# Patient Record
Sex: Male | Born: 1960 | Race: White | Hispanic: No | Marital: Married | Smoking: Former smoker
Health system: Southern US, Community
[De-identification: ages and names within clinical notes are randomized; demographics above are authoritative.]

## PROBLEM LIST (undated history)

## (undated) DIAGNOSIS — C61 Malignant neoplasm of prostate: Secondary | ICD-10-CM

## (undated) HISTORY — DX: Malignant neoplasm of prostate: C61

## (undated) HISTORY — PX: SHOULDER SURGERY: SHX246

---

## 2004-10-30 ENCOUNTER — Emergency Department: Payer: Self-pay | Admitting: Unknown Physician Specialty

## 2008-08-20 ENCOUNTER — Emergency Department: Payer: Self-pay | Admitting: Emergency Medicine

## 2012-09-01 ENCOUNTER — Inpatient Hospital Stay: Payer: Self-pay | Admitting: Internal Medicine

## 2012-09-01 LAB — COMPREHENSIVE METABOLIC PANEL
Alkaline Phosphatase: 91 U/L (ref 50–136)
Anion Gap: 8 (ref 7–16)
BUN: 15 mg/dL (ref 7–18)
Calcium, Total: 8.9 mg/dL (ref 8.5–10.1)
Chloride: 102 mmol/L (ref 98–107)
Co2: 23 mmol/L (ref 21–32)
Creatinine: 1.15 mg/dL (ref 0.60–1.30)
EGFR (African American): >60
EGFR (Non-African Amer.): >60
Glucose: 114 mg/dL — ABNORMAL HIGH (ref 65–99)
Osmolality: 268 (ref 275–301)
Potassium: 3.9 mmol/L (ref 3.5–5.1)
SGOT(AST): 44 U/L — ABNORMAL HIGH (ref 15–37)
SGPT (ALT): 64 U/L (ref 12–78)
Total Protein: 7.8 g/dL (ref 6.4–8.2)

## 2012-09-01 LAB — URINALYSIS, COMPLETE
Glucose,UR: NEGATIVE mg/dL (ref 0–75)
Leukocyte Esterase: NEGATIVE
Nitrite: NEGATIVE
Ph: 5 (ref 4.5–8.0)
Protein: NEGATIVE
RBC,UR: 4 /HPF (ref 0–5)
Squamous Epithelial: NONE SEEN
WBC UR: 3 /HPF (ref 0–5)

## 2012-09-01 LAB — CBC WITH DIFFERENTIAL/PLATELET
Basophil #: 0.1 10*3/uL (ref 0.0–0.1)
Eosinophil #: 0 10*3/uL (ref 0.0–0.7)
HCT: 40.8 % (ref 40.0–52.0)
Lymphocyte #: 1.6 10*3/uL (ref 1.0–3.6)
Lymphocyte %: 9 %
MCH: 30.7 pg (ref 26.0–34.0)
MCHC: 34.4 g/dL (ref 32.0–36.0)
Monocyte #: 1.4 x10 3/mm — ABNORMAL HIGH (ref 0.2–1.0)
Monocyte %: 8.4 %
Neutrophil #: 14.1 10*3/uL — ABNORMAL HIGH (ref 1.4–6.5)
Neutrophil %: 82.2 %
Platelet: 200 10*3/uL (ref 150–440)
RBC: 4.59 10*6/uL (ref 4.40–5.90)
WBC: 17.1 10*3/uL — ABNORMAL HIGH (ref 3.8–10.6)

## 2012-09-01 LAB — PROTIME-INR
INR: 1
Prothrombin Time: 13 secs (ref 11.5–14.7)

## 2012-09-01 LAB — TSH: Thyroid Stimulating Horm: 2.8 u[IU]/mL

## 2012-09-01 LAB — CK TOTAL AND CKMB (NOT AT ARMC): CK-MB: 1.4 ng/mL (ref 0.5–3.6)

## 2012-09-01 LAB — TROPONIN I: Troponin-I: <0.02 ng/mL

## 2012-09-01 LAB — HEMOGLOBIN A1C: Hemoglobin A1C: 5.1 % (ref 4.2–6.3)

## 2012-09-01 IMAGING — US US PELVIS LIMITED
1 series · 14 of 25 positions shown · non-contrast
Comparison: none

REASON FOR EXAM: swelling, pain
COMMENTS:

PROCEDURE:     US  - US TESTICULAR  - [DATE]  [DATE]
RESULT:     Comparison: None
TECHNIQUE: Multiple gray-scale, color Doppler, and spectral Doppler images
were obtained of the testicles.

[Series 1: us pelvis limited · 0.09mm/px · 14 of 87 slices shown]
[im 1/87]
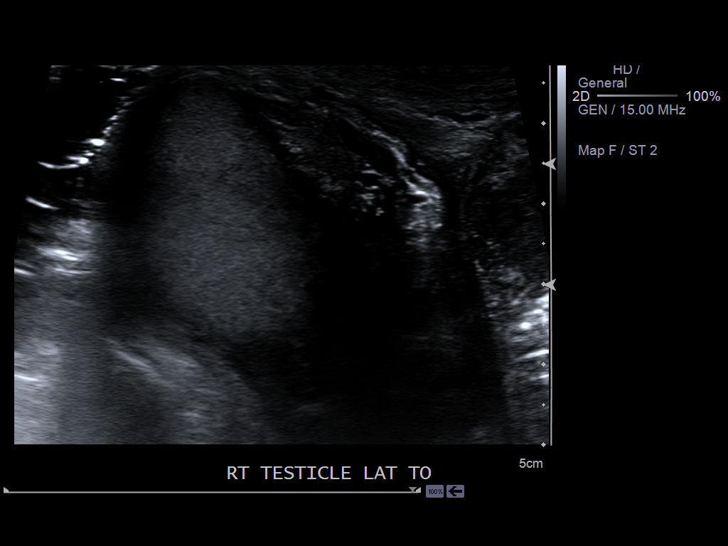
[im 8/87]
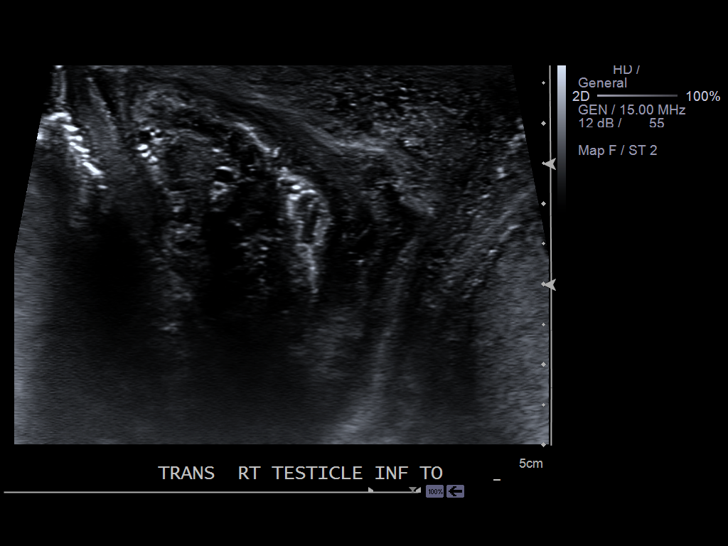
[im 15/87]
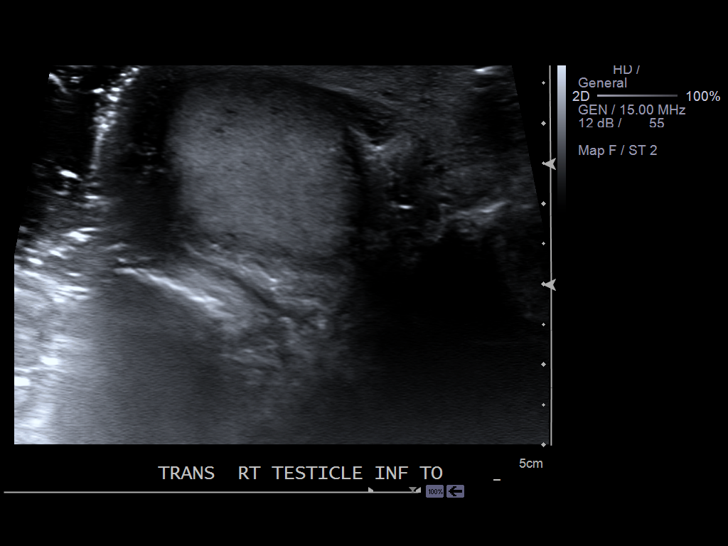
[im 22/87]
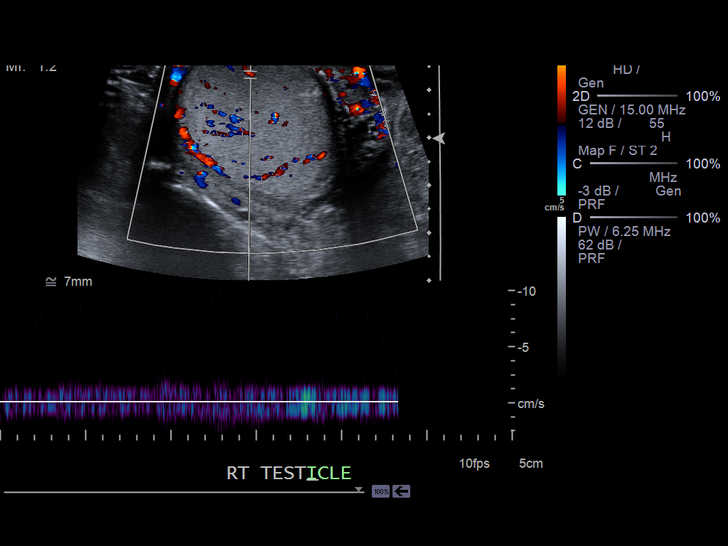
[im 29/87]
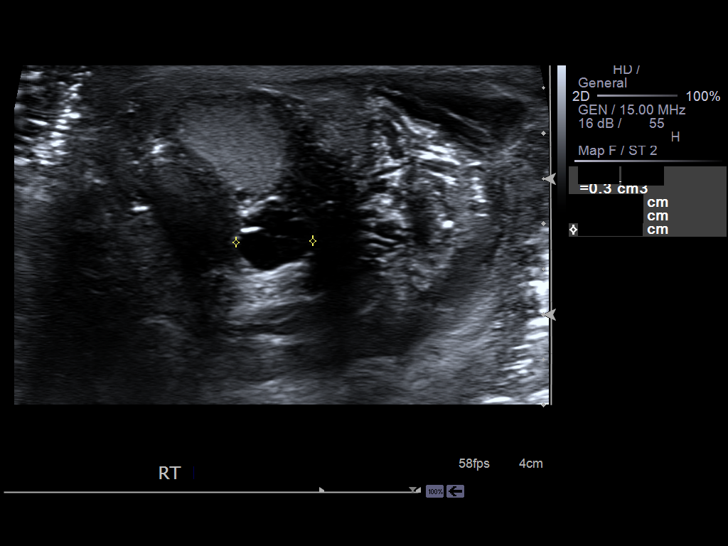
[im 33/87]
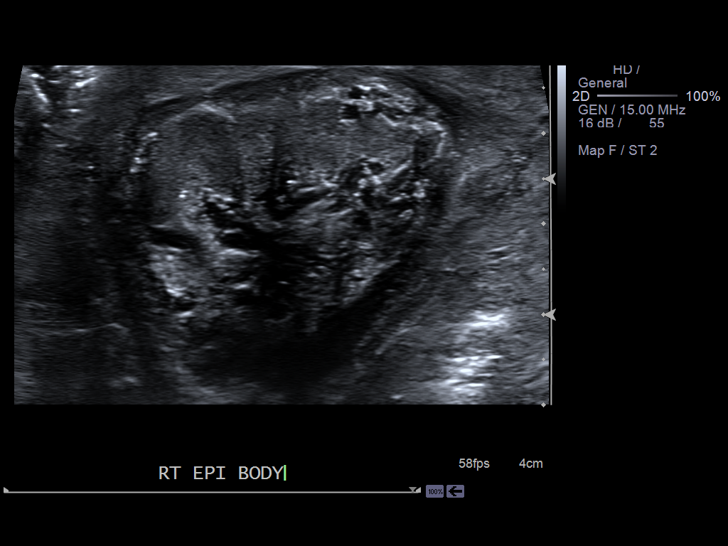
[im 40/87]
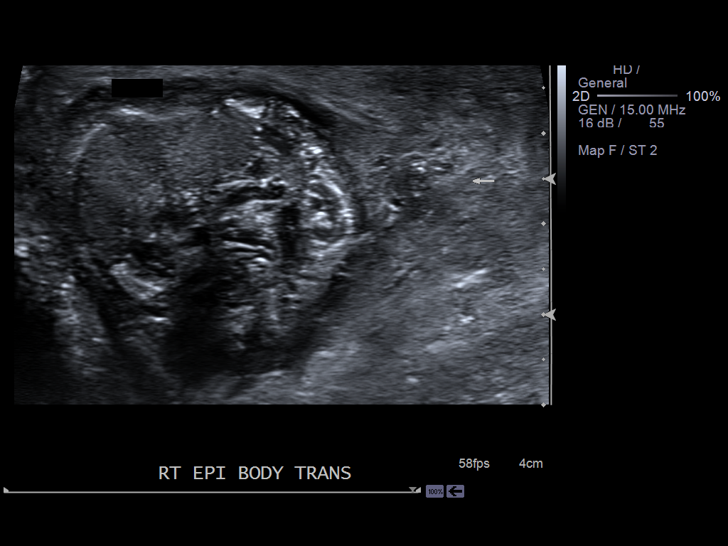
[im 47/87]
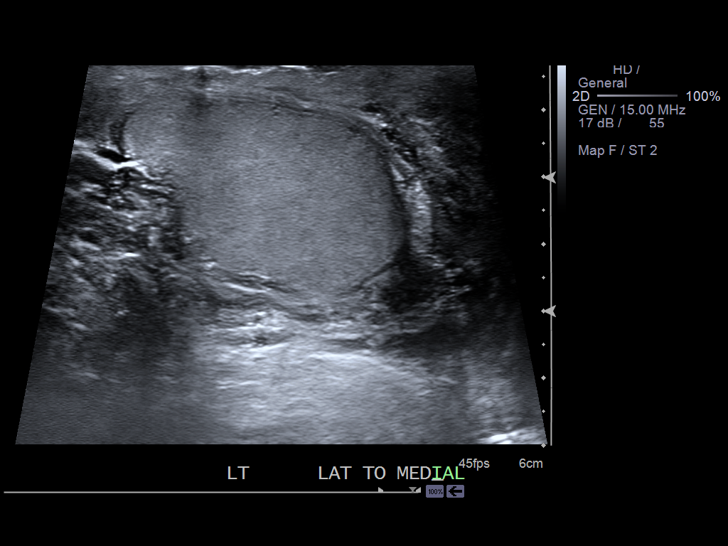
[im 54/87]
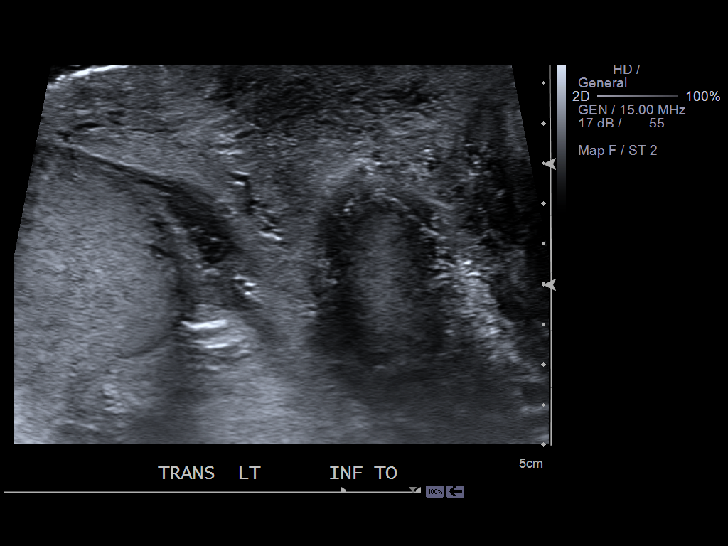
[im 58/87]
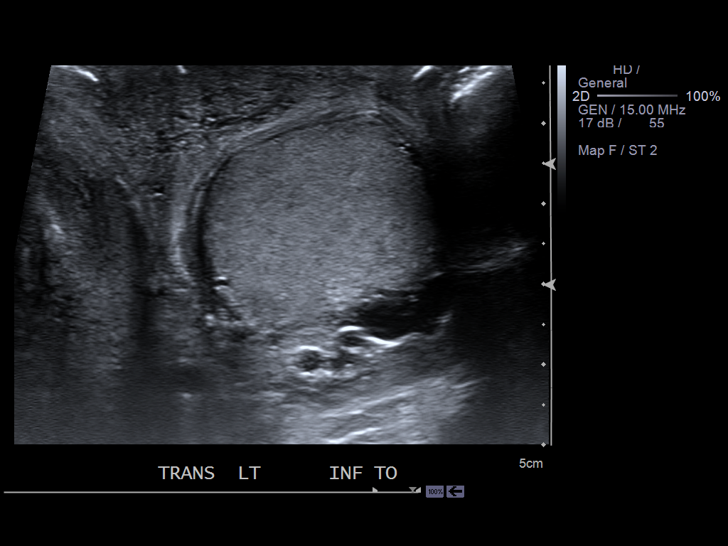
[im 65/87]
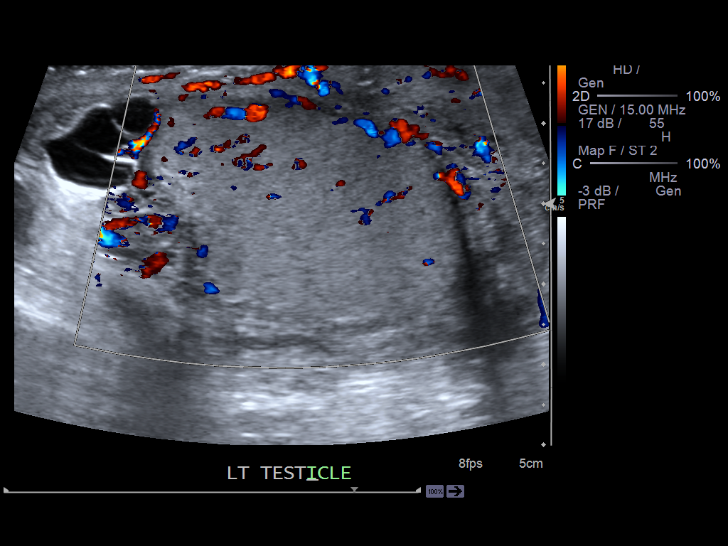
[im 72/87]
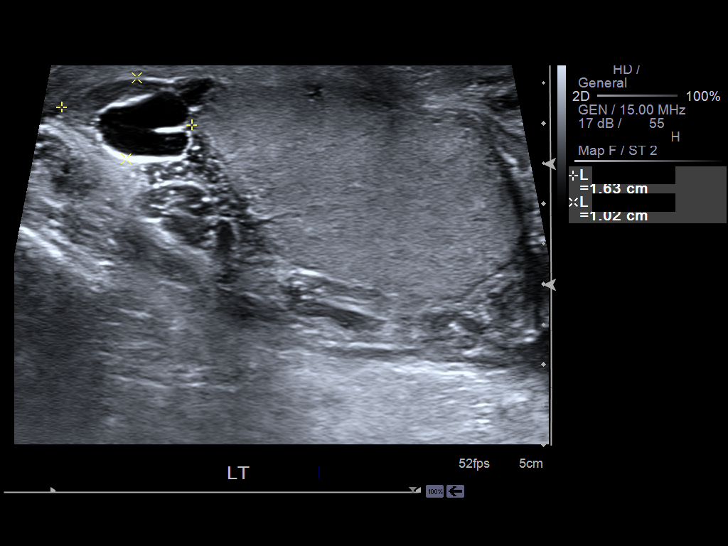
[im 79/87]
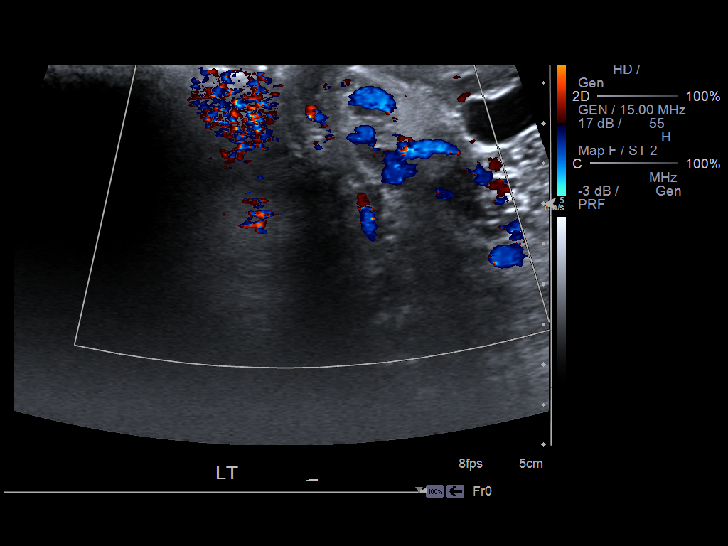
[im 87/87]
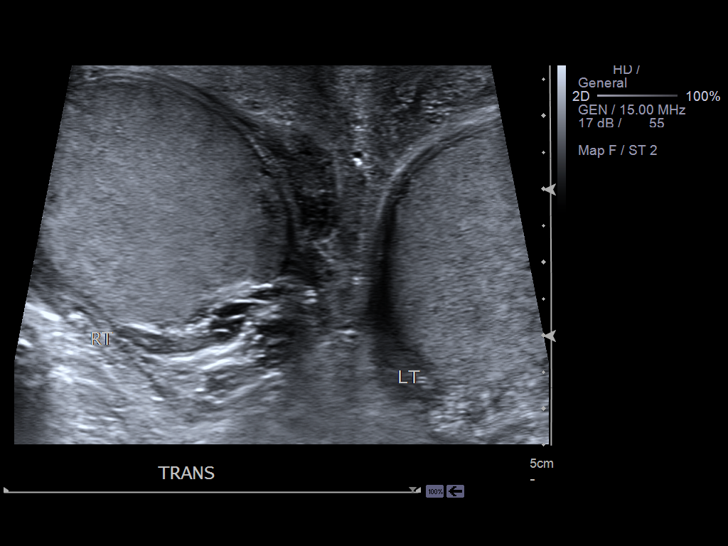

[14 of 25 positions shown; findings below may reference images not displayed]

FINDINGS: The right testicle measures 4.1 x 3.7 x 3.4 cm. The left testicle measures
4.6 x 3.0 x 2.7 cm. The testicles are homogeneous in echotexture. Arterial
and venous waveforms are demonstrated in bilateral testicles. The right
epididymis appears relatively heterogeneous and hypervascular. Correlate for
epididymitis. There are several cysts in the left epididymis. The soft
tissues and skin of the scrotum appears thickened, left greater than right.
This is nonspecific.
IMPRESSION: 1. The right epididymis is heterogeneous and hypervascular. Correlate for
epididymitis.
2. Scrotal soft tissues and skin appear thickened, left greater than right.
This is nonspecific. Possibilities would include cellulitis.

[REDACTED]

## 2012-09-01 IMAGING — CR DG CHEST 2V
1 series · 2 of 2 positions shown · non-contrast
Comparison: none

REASON FOR EXAM: fever cough
COMMENTS:

[Series 1: w chest pa · 0.14mm/px · 2 of 2 slices shown]
[im 1/2]
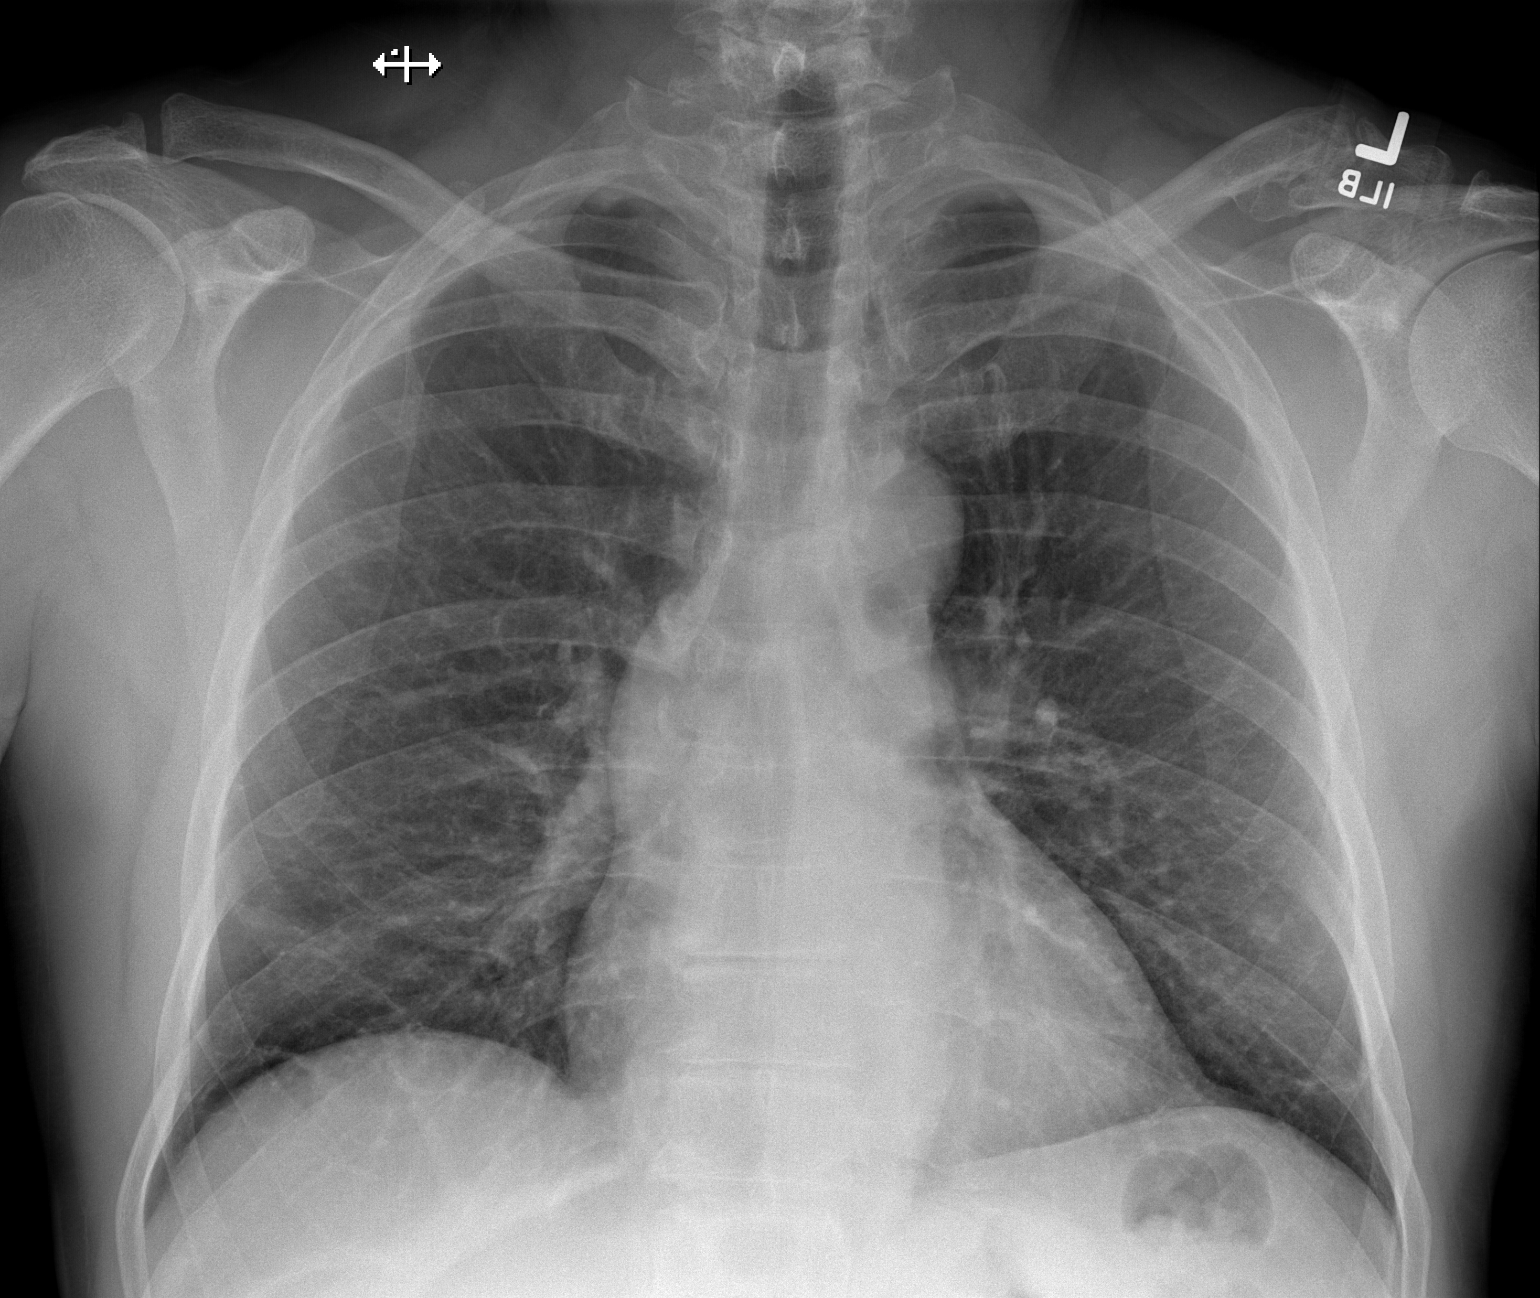
[im 2/2]
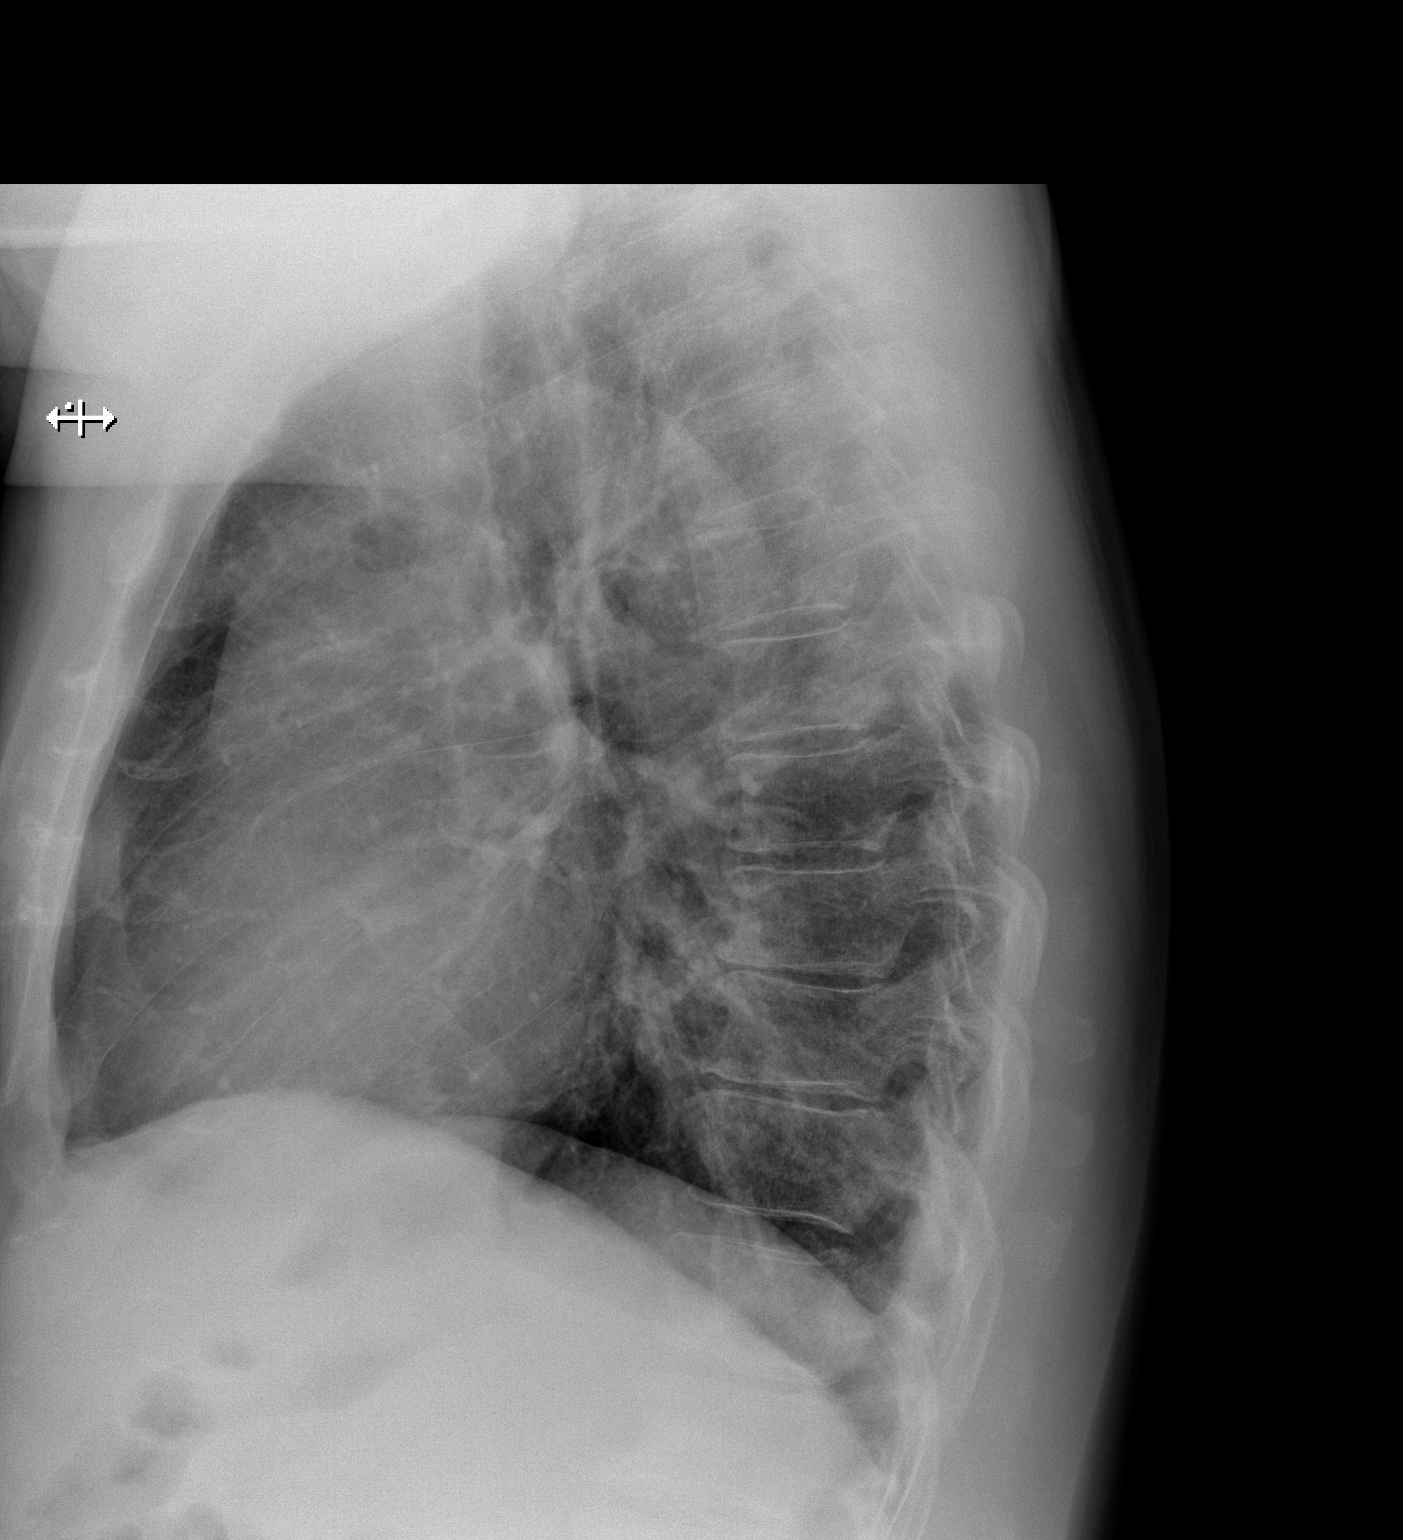

[2 of 2 positions shown; findings below may reference images not displayed]

PROCEDURE:     DXR - DXR CHEST PA (OR AP) AND LATERAL  - [DATE]  [DATE]

RESULT:     The lungs are well-expanded. The interstitial markings are
increased bilaterally. The cardiac silhouette is normal in size. The
pulmonary vascularity is not engorged. There is no pleural effusion. The
bony thorax exhibits no acute abnormality.
IMPRESSION: Increased interstitial markings especially on the right
suggest subsegmental atelectasis or early pneumonia. Followup films
following therapy are recommended to assure clearing.

[REDACTED]

## 2012-09-02 LAB — CBC WITH DIFFERENTIAL/PLATELET
Eosinophil #: 0 10*3/uL (ref 0.0–0.7)
Eosinophil %: 0 %
HCT: 34.7 % — ABNORMAL LOW (ref 40.0–52.0)
HGB: 11.9 g/dL — ABNORMAL LOW (ref 13.0–18.0)
MCH: 30.8 pg (ref 26.0–34.0)
MCV: 90 fL (ref 80–100)
Monocyte #: 1.9 x10 3/mm — ABNORMAL HIGH (ref 0.2–1.0)
Monocyte %: 9.5 %
Neutrophil #: 16.8 10*3/uL — ABNORMAL HIGH (ref 1.4–6.5)
Neutrophil %: 86.6 %
Platelet: 146 10*3/uL — ABNORMAL LOW (ref 150–440)
RDW: 14.2 % (ref 11.5–14.5)

## 2012-09-02 LAB — CK TOTAL AND CKMB (NOT AT ARMC)
CK-MB: 1 ng/mL (ref 0.5–3.6)
CK-MB: 1 ng/mL (ref 0.5–3.6)

## 2012-09-02 LAB — COMPREHENSIVE METABOLIC PANEL
Albumin: 2.6 g/dL — ABNORMAL LOW (ref 3.4–5.0)
Alkaline Phosphatase: 71 U/L (ref 50–136)
Anion Gap: 12 (ref 7–16)
BUN: 15 mg/dL (ref 7–18)
Calcium, Total: 8.1 mg/dL — ABNORMAL LOW (ref 8.5–10.1)
Chloride: 107 mmol/L (ref 98–107)
Co2: 20 mmol/L — ABNORMAL LOW (ref 21–32)
Creatinine: 1.11 mg/dL (ref 0.60–1.30)
EGFR (African American): >60
EGFR (Non-African Amer.): >60
Glucose: 141 mg/dL — ABNORMAL HIGH (ref 65–99)
Osmolality: 281 (ref 275–301)
Potassium: 4 mmol/L (ref 3.5–5.1)
SGOT(AST): 20 U/L (ref 15–37)
Sodium: 139 mmol/L (ref 136–145)

## 2012-09-02 LAB — MAGNESIUM: Magnesium: 1.3 mg/dL — ABNORMAL LOW

## 2012-09-02 LAB — GC/CHLAMYDIA PROBE AMP

## 2012-09-03 LAB — BASIC METABOLIC PANEL
Anion Gap: 7 (ref 7–16)
Chloride: 109 mmol/L — ABNORMAL HIGH (ref 98–107)
Co2: 24 mmol/L (ref 21–32)
Creatinine: 0.98 mg/dL (ref 0.60–1.30)
EGFR (African American): >60
Osmolality: 284 (ref 275–301)
Potassium: 4.1 mmol/L (ref 3.5–5.1)
Sodium: 140 mmol/L (ref 136–145)

## 2012-09-03 LAB — CBC WITH DIFFERENTIAL/PLATELET
Basophil #: 0 10*3/uL (ref 0.0–0.1)
Eosinophil %: 0.5 %
HCT: 34.8 % — ABNORMAL LOW (ref 40.0–52.0)
HGB: 12.1 g/dL — ABNORMAL LOW (ref 13.0–18.0)
Lymphocyte #: 0.8 10*3/uL — ABNORMAL LOW (ref 1.0–3.6)
Lymphocyte %: 5 %
MCHC: 34.8 g/dL (ref 32.0–36.0)
Monocyte #: 0.6 x10 3/mm (ref 0.2–1.0)
Monocyte %: 3.8 %
Platelet: 161 10*3/uL (ref 150–440)
RDW: 13.8 % (ref 11.5–14.5)
WBC: 15 10*3/uL — ABNORMAL HIGH (ref 3.8–10.6)

## 2012-09-03 LAB — URINE CULTURE

## 2012-09-04 ENCOUNTER — Ambulatory Visit: Payer: Self-pay | Admitting: Urology

## 2012-09-04 LAB — CBC WITH DIFFERENTIAL/PLATELET
Basophil #: 0.1 10*3/uL (ref 0.0–0.1)
Eosinophil %: 0.8 %
HCT: 36.4 % — ABNORMAL LOW (ref 40.0–52.0)
Lymphocyte #: 1.2 10*3/uL (ref 1.0–3.6)
MCV: 89 fL (ref 80–100)
Monocyte %: 9.8 %
Neutrophil #: 12.3 10*3/uL — ABNORMAL HIGH (ref 1.4–6.5)
Neutrophil %: 80.7 %
Platelet: 171 10*3/uL (ref 150–440)
RBC: 4.09 10*6/uL — ABNORMAL LOW (ref 4.40–5.90)
RDW: 13.9 % (ref 11.5–14.5)
WBC: 15.3 10*3/uL — ABNORMAL HIGH (ref 3.8–10.6)

## 2012-09-04 LAB — BASIC METABOLIC PANEL
BUN: 12 mg/dL (ref 7–18)
Calcium, Total: 8.6 mg/dL (ref 8.5–10.1)
Chloride: 106 mmol/L (ref 98–107)
Co2: 25 mmol/L (ref 21–32)
Creatinine: 1.09 mg/dL (ref 0.60–1.30)
EGFR (African American): >60
Glucose: 93 mg/dL (ref 65–99)
Potassium: 3.7 mmol/L (ref 3.5–5.1)
Sodium: 138 mmol/L (ref 136–145)

## 2012-09-05 LAB — CBC WITH DIFFERENTIAL/PLATELET
Basophil #: 0.1 10*3/uL (ref 0.0–0.1)
Basophil %: 0.5 %
HCT: 38.5 % — ABNORMAL LOW (ref 40.0–52.0)
HGB: 13 g/dL (ref 13.0–18.0)
Lymphocyte %: 9.3 %
MCH: 30.3 pg (ref 26.0–34.0)
MCHC: 33.8 g/dL (ref 32.0–36.0)
Monocyte #: 1.9 x10 3/mm — ABNORMAL HIGH (ref 0.2–1.0)
Monocyte %: 14.3 %
Neutrophil #: 10.1 10*3/uL — ABNORMAL HIGH (ref 1.4–6.5)
Neutrophil %: 75.1 %
RBC: 4.3 10*6/uL — ABNORMAL LOW (ref 4.40–5.90)
RDW: 14 % (ref 11.5–14.5)
WBC: 13.4 10*3/uL — ABNORMAL HIGH (ref 3.8–10.6)

## 2012-09-06 LAB — CBC WITH DIFFERENTIAL/PLATELET
Basophil #: 0.1 10*3/uL (ref 0.0–0.1)
Basophil %: 0.8 %
Eosinophil #: 0.2 10*3/uL (ref 0.0–0.7)
Eosinophil %: 2.5 %
HCT: 36.9 % — ABNORMAL LOW (ref 40.0–52.0)
Lymphocyte #: 1.8 10*3/uL (ref 1.0–3.6)
Lymphocyte %: 18.2 %
MCH: 31 pg (ref 26.0–34.0)
Monocyte #: 1.6 x10 3/mm — ABNORMAL HIGH (ref 0.2–1.0)
Platelet: 224 10*3/uL (ref 150–440)
RBC: 4.16 10*6/uL — ABNORMAL LOW (ref 4.40–5.90)
RDW: 14.1 % (ref 11.5–14.5)

## 2012-09-06 LAB — CULTURE, BLOOD (SINGLE)

## 2012-09-06 LAB — BASIC METABOLIC PANEL
Anion Gap: 8 (ref 7–16)
Chloride: 106 mmol/L (ref 98–107)
Osmolality: 282 (ref 275–301)
Potassium: 3.5 mmol/L (ref 3.5–5.1)
Sodium: 142 mmol/L (ref 136–145)

## 2012-09-07 LAB — CBC WITH DIFFERENTIAL/PLATELET
Basophil #: 0.1 10*3/uL (ref 0.0–0.1)
Basophil %: 0.9 %
Eosinophil #: 0.3 10*3/uL (ref 0.0–0.7)
Eosinophil %: 3.4 %
HCT: 36.5 % — ABNORMAL LOW (ref 40.0–52.0)
HGB: 12.6 g/dL — ABNORMAL LOW (ref 13.0–18.0)
Lymphocyte %: 18.1 %
MCH: 30.9 pg (ref 26.0–34.0)
MCHC: 34.6 g/dL (ref 32.0–36.0)
MCV: 89 fL (ref 80–100)
Monocyte #: 1.6 x10 3/mm — ABNORMAL HIGH (ref 0.2–1.0)
Monocyte %: 15.9 %
Platelet: 288 10*3/uL (ref 150–440)
RDW: 14.2 % (ref 11.5–14.5)
WBC: 10.1 10*3/uL (ref 3.8–10.6)

## 2012-09-07 IMAGING — US US PELVIS LIMITED
1 series · 14 of 25 positions shown · non-contrast
Comparison: none

REASON FOR EXAM: b/l swelling, drainage from testies, r/o abscess
COMMENTS:

[Series 1: us pelvis limited · 0.11mm/px · 14 of 61 slices shown]
[im 1/61]
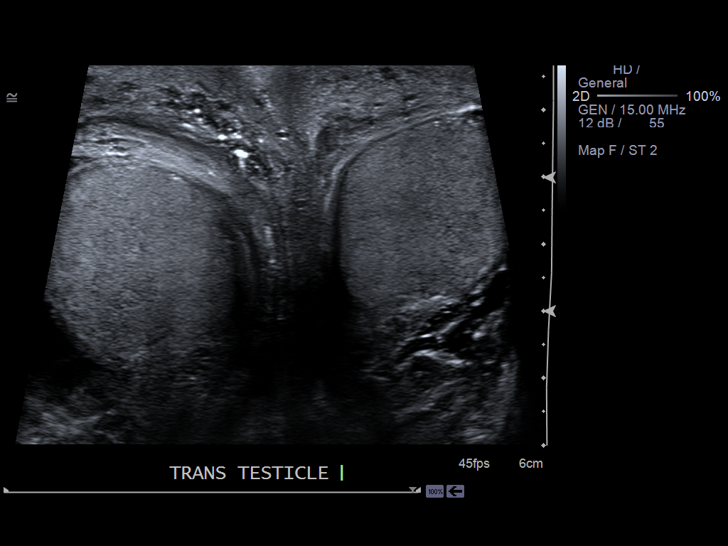
[im 6/61]
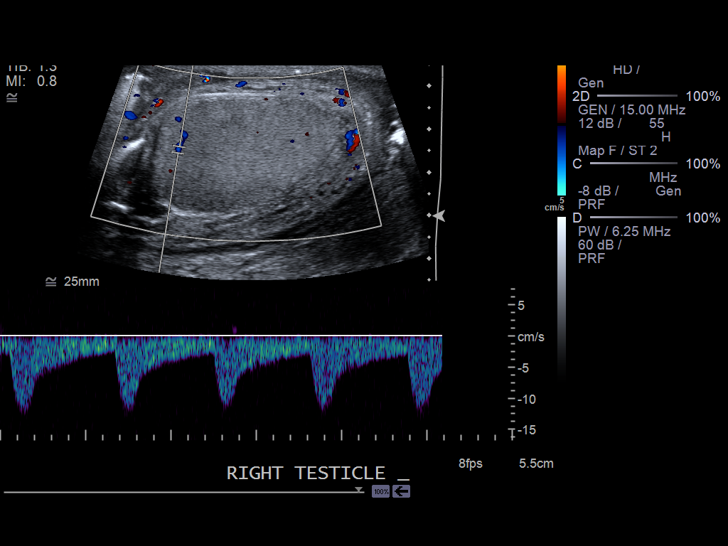
[im 11/61]
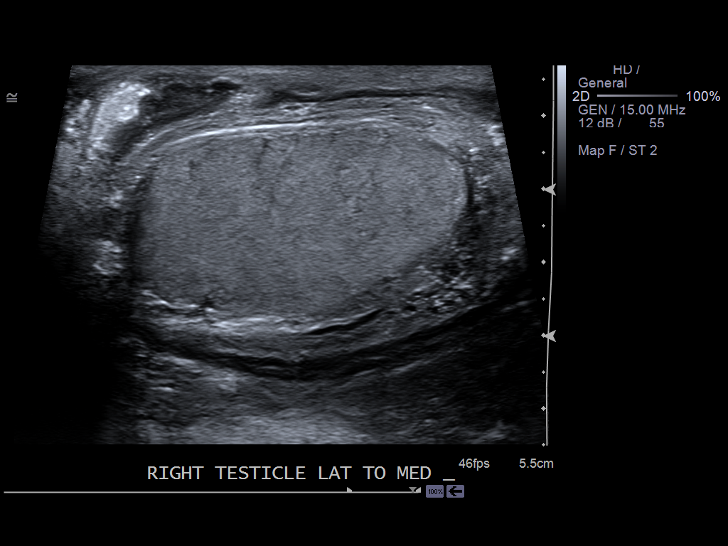
[im 16/61]
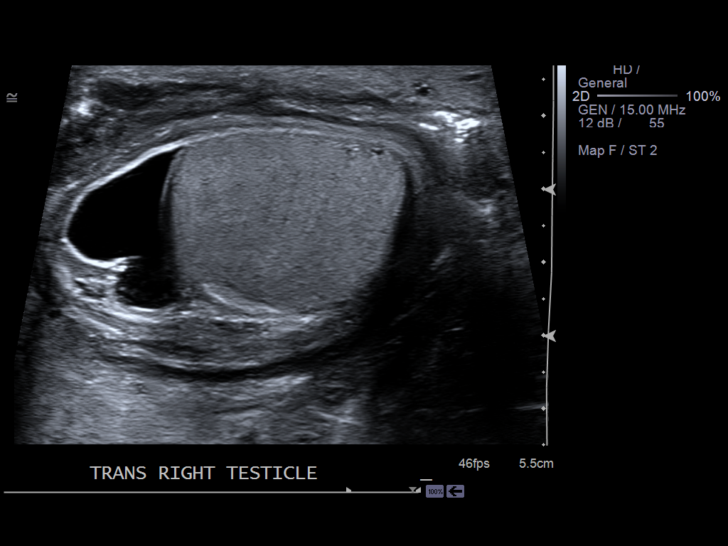
[im 21/61]
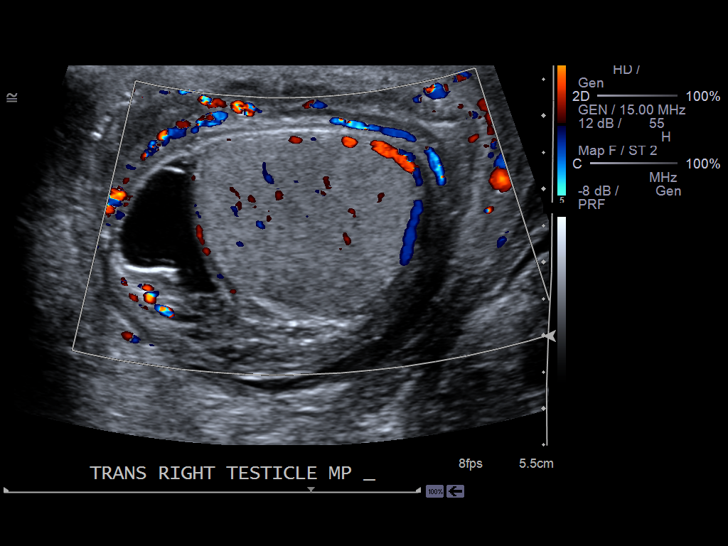
[im 23/61]
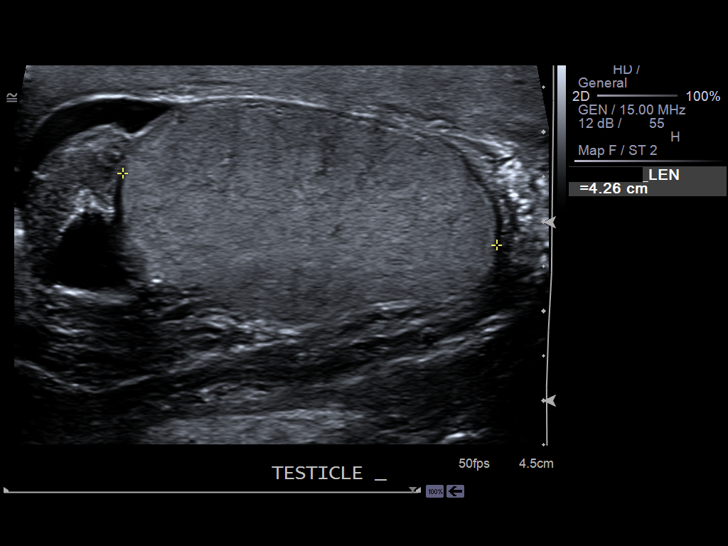
[im 28/61]
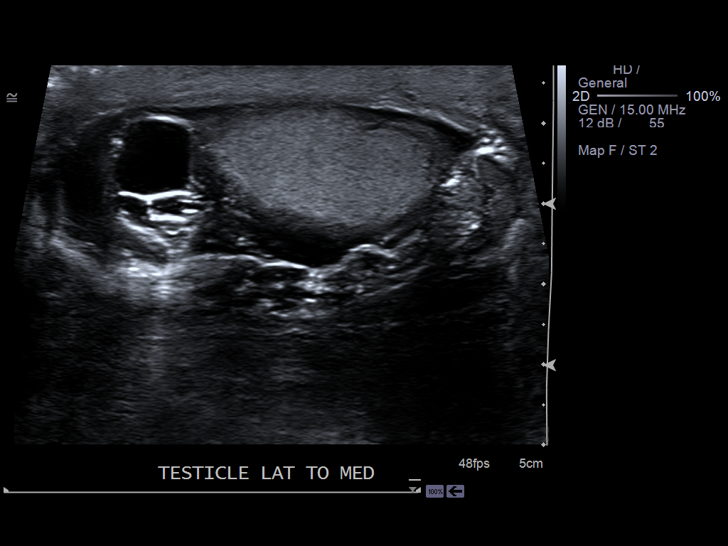
[im 33/61]
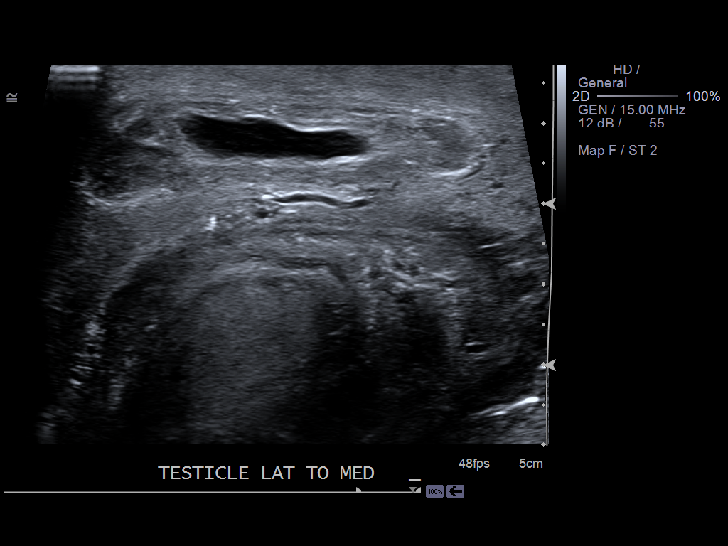
[im 38/61]
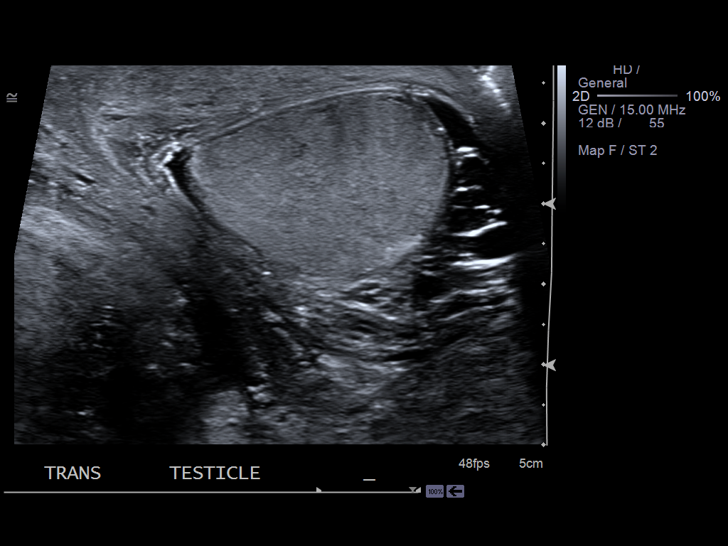
[im 41/61]
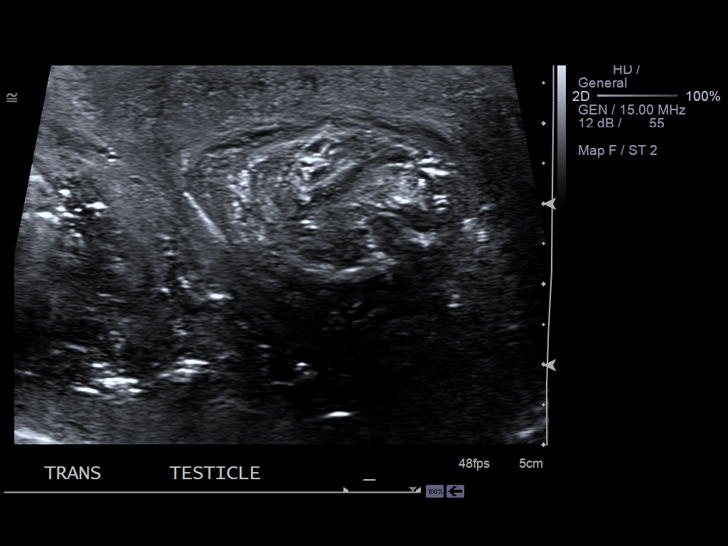
[im 46/61]
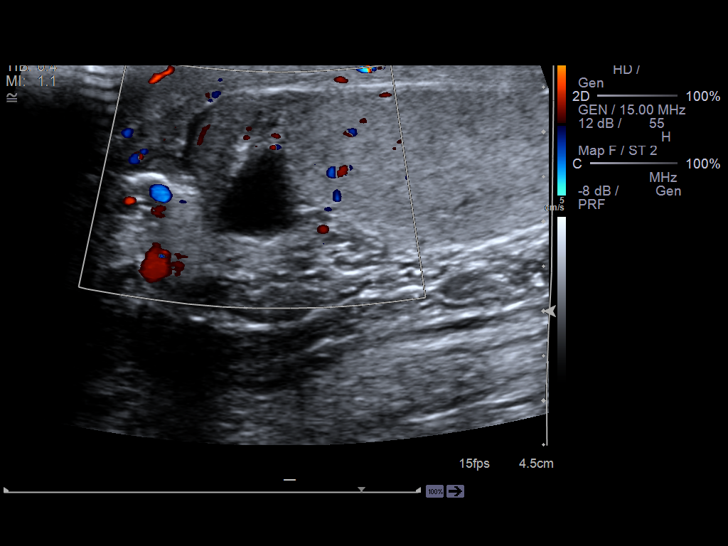
[im 51/61]
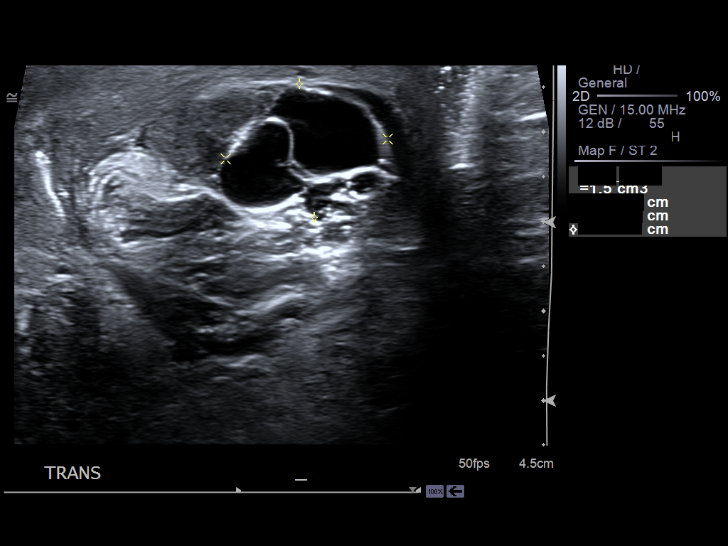
[im 56/61]
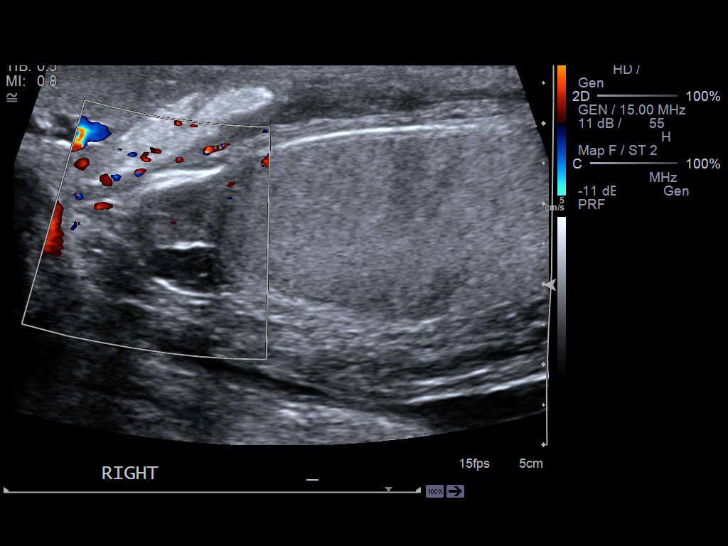
[im 61/61]
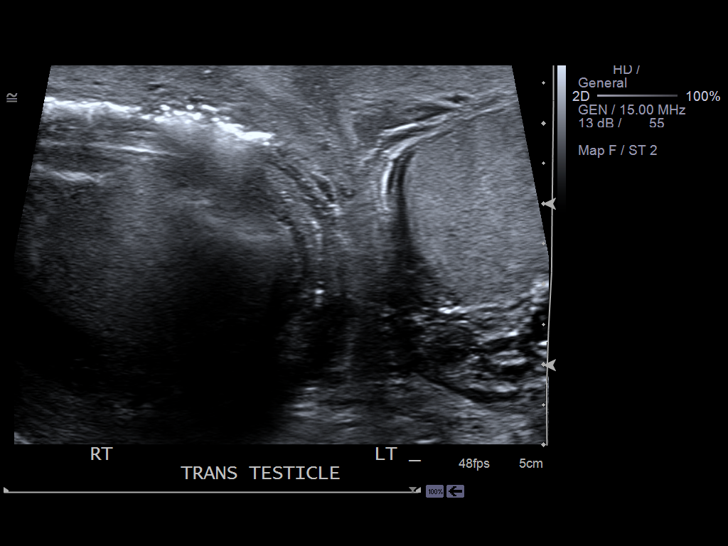

[14 of 25 positions shown; findings below may reference images not displayed]

PROCEDURE:     US  - US TESTICULAR  - [DATE]  [DATE]

RESULT:     Testicular ultrasound is performed. The right testicle measures
4.40 x 3.31 x 2.55 cm. The left testicle measures 4.26 x 3.27 x 2.41 cm.
Epididymal cysts are present bilaterally. The complex cyst on the left is
1.02 x 1.83 x 1.50 cm in size and is septated. The right epididymal cyst
measures 0.98 x 0.79 x 0.70 cm. Arterial and venous waveforms are
demonstrated with spectral Doppler in both testicles. There are bilateral
hydroceles with septations in the right hydrocele. No testicular mass or
calcification is present.
IMPRESSION: Bilateral complex epididymal cysts. Bilateral hydroceles
with septations on the right. Scrotal wall thickening is present
bilaterally. Calcifications are seen around the right scrotum which could
represent scrotal pearls.

[REDACTED]

## 2012-09-16 DIAGNOSIS — N390 Urinary tract infection, site not specified: Secondary | ICD-10-CM | POA: Insufficient documentation

## 2012-09-16 DIAGNOSIS — N454 Abscess of epididymis or testis: Secondary | ICD-10-CM | POA: Insufficient documentation

## 2012-09-16 DIAGNOSIS — N492 Inflammatory disorders of scrotum: Secondary | ICD-10-CM | POA: Insufficient documentation

## 2014-06-24 NOTE — Discharge Summary (Signed)
PATIENT NAME:  Cody Vaughan, Cody Vaughan MR#:  [id number] DATE OF BIRTH:  [birth year]  DATE OF ADMISSION:  09/01/2012 DATE OF DISCHARGE:  09/09/2012  ADMITTING DIAGNOSIS: Scrotal swelling, pain.   DISCHARGE DIAGNOSES:  1. Scrotal swelling and sepsis due to severe epididymitis with associated possible abscess with spontaneous drainage, status post evaluation by urology.  2. Elevated transaminase, likely due to chronic hepatitis C.  3. Leukocytosis due to epididymitis, now resolved.  4. Previous substance abuse with alcohol as well as cocaine. No evidence of withdrawal.  5. Status post left shoulder surgery in 1980 and right thumb surgery.   CONSULTANTS: Dr. Jacqlyn Larsen as well as Dr. Delma Officer of urology.   PERTINENT LABORATORY AND EVALUATIONS: His BMP on admission: Glucose 114, BUN 15, creatinine 1.15, sodium 133, potassium was 3.9, chloride 102, CO2 was 23, calcium was 8.9, his magnesium 1.4. LFTs: Total protein 7.8, albumin 3.6, bilirubin total was 0.8, alkaline phosphatase was 91, AST 44, ALT was 64. CPK was 617, CK-MB 1.4, troponin less than 0.02. TSH 2.80. WBC 17.1, hemoglobin 14.1, platelet count was 200. Blood cultures x 2: No growth. Chlamydia and Neisseria gonorrhoeae were negative. Cultures from the drainage showed a few gram-positive cocci in pairs, rare gram-negative rods, rare gram-positive rods. Chest x-ray on admission showed increased interstitial marking on the right suggestive of segmental atelectasis. Ultrasound on presentation showed right epididymis is heterogeneous and hypervascular, suggestive of possible epididymitis, scrotal soft tissue and skin appear thickened, left greater than right, it is nonspecific, possibly could be cellulitis. Repeat ultrasound on July 7th showed bilateral complex epidermal cyst, bilateral hydroceles with septation of the right, scrotal wall thickening is present bilaterally. Calcifications are seen around the right scrotum, which could represent scrotal pearls.    HOSPITAL COURSE: Please refer to H and P done by the admitting physician. The patient is a 54 year old white male who presented with scrotal swelling going on for more than a few days. He also started having fevers. He waited until he came to the ED. He had significant amount of swelling on his scrotum on presentation. He had fever and tachycardia. He was thought to have possible sepsis due to epididymitis. The patient was started on broad-spectrum antibiotics. He was treated with IV Zosyn. Initially, he was on IV Levaquin, which was subsequently changed to Zosyn due to severity of his epididymitis. The patient was continued on IV antibiotics up until discharge. He was seen in consultation by urology. After being hospitalized for a few days, the patient started having spontaneous drainage and opening in his scrotal sac of whitish drainage. Once this drainage started happening, his swelling started improving. He was seen by urology. They did not feel that he had Fournier's gangrene. He had 2 ultrasounds which failed to reveal any loculated abscess. The patient is doing much better and will be treated with oral antibiotics for 10 more days.   DISCHARGE MEDICATIONS:  1. Trazodone 100 q.h.s.  2. Gabapentin 300 two caps 3 times a day.  3. Melatonin 1 tab q.h.s.  4. Ibuprofen 400 mg q.4 p.r.n. 5. Acetaminophen/hydrocodone 325/5 q.6 p.r.n. for pain. 6. Levaquin 750 one tab p.o. q.24 for the next 10 days.   FOLLOWUP: Outpatient followup with Dr. Jacqlyn Larsen in 1 to 2 weeks. Outpatient followup with primary MD in 1 to 2 weeks.   TIME SPENT: Note, 35 minutes spent on the discharge.  ____________________________ Lafonda Mosses. Posey Pronto, MD shp:OSi D: 09/10/2012 08:13:47 ET T: 09/10/2012 08:30:27 ET JOB#: [id number]  cc: Prairie Stenberg H. Posey Pronto,  MD, <Dictator> Alric Seton MD ELECTRONICALLY SIGNED 09/15/2012 11:00

## 2014-06-24 NOTE — H&P (Signed)
PATIENT NAME:  Cody Vaughan, Cody Vaughan MR#:  [id number] DATE OF BIRTH:  1960-03-27  DATE OF ADMISSION:  09/01/2012  PRIMARY CARE PHYSICIAN:  Unknown.   HISTORY OF PRESENT ILLNESS:  The patient is a 54 year old Caucasian male with past medical history significant for history of substance abuse, alcohol, as well as cocaine. Also history of hepatitis C, which is not treated. He presents to the hospital with complaints of fevers. According to patient as well as his wife, he has been having fevers for the past 3 days. He  has complaints of achiness, fever, as well as feeling bad, and has swollen, red right testicle area. He presented to the hospital and he had ultrasound of his testicular area, which revealed right epididymitis as well as questionable cellulitis, and hospitalist services were contacted for admission. The patient stated that whenever his fever started, that is when he started having the swelling of the right side of his scrotum. It has worsened over the past 3 days, and now he cannot even walk.   PAST MEDICAL HISTORY:  Significant for history of substance abuse, alcohol as well as cocaine, history of hepatitis C.  MEDICATIONS:  Gabapentin 600 mg p.o. 3 times daily, ibuprofen 500 mg  p.o. every 4 hours as needed, melatonin 3 mg p.o. at bedtime, trazodone 100 mg p.o. at bedtime.   ALLERGIES:  None.   PAST SURGICAL HISTORY:  Left shoulder surgery in 1980s and right thumb surgery.  FAMILY HISTORY:  Significant for history of MI in patient's dad, who died in his 43s. The patient's mother had COPD. She was a smoker, and she died in her 4s as well. The patient's maternal grandmother had ovarian cancer. Maternal grandfather had lung cancer.   SOCIAL HISTORY:  The patient is married and has 2 children. Smokes 1 pack per day for the past 38 years.  No alcohol now. He is clean of alcohol as well as cocaine for the past 8 months. 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  REVIEW OF SYSTEMS:   GENERAL:   Positive for fevers and chills, significant weakness, pains in his right groin, weight loss, some blurring of vision, intermittent cough, some clear sputum production, right chest pains. He describes it as a sharp pain, intermittent, with right arm numbness. It improves whenever he lifts his arm up. Admits to having some lower extremity cramping, increased frequency of urination, and dysuria symptoms. Denies any vision problems such as double vision, glaucoma or cataracts.  EARS, NOSE, THROAT:  Denies any tinnitus, allergies, epistaxis, sinus pain, dentures, or difficulty swallowing.  RESPIRATORY:  Denies any wheezing, asthma, COPD.  CARDIOVASCULAR:  Denies any orthopnea, edema, arrhythmias or palpitations.  GASTROINTESTINAL:  Denies any nausea, vomiting, diarrhea or constipation. GENITOURINARY: Denies dysuria, hematuria, incontinence. ENDOCRINE:  Denies any polydipsia, nocturia, thyroid problems, heat or cold intolerance, or thirst. HEMATOLOGIC/LYMPHATIC: Denies anemia, easy bruising, bleeding, swollen glands. SKIN:  Denies acne, rash,  lesions, or change in moles.  MUSCULOSKELETAL: Denies (Dictation Anomaly) muscle aches, contusion, difficulty walking.  NEUROLOGIC:  No numbness, epilepsy or tremor.  PSYCHIATRIC:  Denies anxiety, insomnia, depression.  PHYSICAL EXAMINATION: VITAL SIGNS:  On arrival to the hospital, temperature 102.3, pulse 111, respiratory rate 20, blood pressure 121/66, saturation 96% on room air.  GENERAL: This is a well-developed, well-nourished Caucasian male in moderate distress secondary to pain, lying on the stretcher.  HEENT: His pupils are equal, reactive to light. Extraocular muscles intact. No icterus or conjunctivitis. He has normal hearing. No pharyngeal erythema. Mucosa  is dry.  NECK:  No masses.  Supple and nontender. Thyroid is not enlarged. No JVD or carotid bruits bilaterally. Full range of motion. LUNGS: Clear to auscultation in all fields.  No rales, rhonchi or  wheezing. No labored respirations, increased effort, dullness to percussion or overt respiratory distress.    CARDIOVASCULAR:  S1, S2 appreciated.  No gallops or rubs noted.  PMI not lateralized.  CHEST:  Nontender to palpation.   EXTREMITIES:  Pedal pulses 1+, No lower extremity edema, calf tenderness or cyanosis was noted.   ABDOMEN: Soft, nontender. Bowel sounds are present. No organomegaly or masses. MUSCLE STRENGTH: Able to move all extremities. No sign of degenerative joint disease or kyphosis.  Gait is not tested.   SKIN:  Revealed significant swelling in the right groin area around the scrotum. The  right side is especially swollen. Increased warmth was noted as well as significant pain. Not able to palpate his epididymis. However, all area of scrotum is severely swollen. I am not able to palpate his testicles as well due to significant pain. Some induration of the skin. Increased redness as well as increased warmth of the skin was noted on the right side and right groin. Otherwise, his skin was swarm and dry to palpation. No adenopathy in cervical region. NEUROLOGIC: Cranial nerves grossly intact. Sensory is intact.  PSYCHIATRIC:  The patient is alert, oriented to time, person, place. Cooperative. Memory is good. No significant confusion, agitation or depression noted.   LABORATORIES:  BMP showed glucose 114, sodium 133, otherwise BMP was unremarkable. Ammonia level was 31. Liver enzymes revealed AST elevation to 44, otherwise unremarkable. Troponin was less than 0.02. White blood cell count is elevated to 17.1, hemoglobin 14.1, platelet count 200, absolute neutrophil count is elevated to 14.1. The patient's pro time/INR was normal at 13.0 and 1.0. Urinalysis revealed yellow hazy urine, negative for glucose, bilirubin, 1+ ketones, specific gravity 1.017, pH was 5.0, 2+ blood, negative for protein, nitrites or leukocyte esterase, 4 red blood cells, 3 white blood cells, trace bacteria, no epithelial  cells. Mucus was present as well as 1 hyaline cast.   RADIOLOGIC STUDIES: Ultrasound of testicle 09/01/2012 revealed right epididymitis being Heterogenous as well as hypervascular. Correlate for epididymitis. (Dictation Anomaly) Scrotal soft tissues and skin appear thickened, left greater than right, which is nonspecific. Possibilities would include cellulitis, according to radiologist.   ASSESSMENT AND PLAN: 1.  Sepsis. Admit patient to medical floor. Blood cultures, urine cultures, as well as Chlamydia, GC; cultures are pending. Will start patient on Levaquin as well as IV fluids.   2.  Epididymitis and questionable scrotal cellulitis. Will get Urology involved and will continue antibiotics.   3.  Elevated transaminases, likely hepatitis C  related. Will follow.   4.  Leukocytosis. Follow with antibiotic therapy.   5.  Questionable urinary tract infection. Cultures were taken. Will continue antibiotics.   6. Tobacco abuse. Nicotine replacement therapy will be initiated. Discussed cessation for approximately 3 minutes.   TIME SPENT:  50 minutes.   ____________________________ Theodoro Grist, MD rv:mr D: 09/01/2012 19:39:00 ET T: 09/01/2012 20:34:16 ET JOB#: [id number]  cc: Theodoro Grist, MD, <Dictator> Swan Zayed MD ELECTRONICALLY SIGNED 09/16/2012 7:08

## 2014-06-24 NOTE — Consult Note (Signed)
Patient seen, chart reviewed, films reviewed, note dictated. Right epididymal orchitis, scrotal cellulitis versus abscess The findings on ultrasound are more consistent with epididymal orchitis.  There is no evidence of abscess within the testicle or skin.  On examination, there is a 6 mm area of necrotic skin with slight fluctuance just below.  This does not extend any further than the area of necrosis.  There is no other evidence of abscess.  There is no expressible purulence from this site.  Close monitoring of this area will be needed.  If there is any progression of the necrosis, incision and drainage will be necessary.  Continuation of the IV antibiotics are all that is indicated otherwise at present.  He will likely need a prolonged course of oral antibiotics.  By mouth Cipro at 750 mg at the time of discharge will likely be recommended.  Urology coverage will follow-up on the patient tomorrow morning.  We will make further recommendations at that time.  Continue with scrotal elevation and ice packs.  If there are any further questions, please free to contact us.  Electronic Signatures: Murrell Redden (MD)  (Signed on 03-Jul-14 17:16)  Authored  Last Updated: 03-Jul-14 17:16 by Murrell Redden (MD)

## 2014-06-24 NOTE — Consult Note (Signed)
Chief Complaint:  Subjective/Chief Complaint HD #4, Day #4 IV Zosyn  Pt without new complaints - continues to be subjectively improving  - decreased penoscrotal edema, decreasing pain/tenderness. Pt. reports increased spontaneously scrotal drainage through the day yesterday and through the night.  Appetite is improving.   VITAL SIGNS/ANCILLARY NOTES: **Vital Signs.:   05-Jul-14 05:04  Vital Signs Type Routine  Temperature Temperature (F) 98.7  Celsius 37  Temperature Source oral  Pulse Pulse 69  Respirations Respirations 16  Systolic BP Systolic BP 382  Diastolic BP (mmHg) Diastolic BP (mmHg) 72  Mean BP 90  Pulse Ox % Pulse Ox % 95  Pulse Ox Activity Level  At rest  Oxygen Delivery Room Air/ 21 %  *Intake and Output.:   Daily 05-Jul-14 07:00  Grand Totals Intake:  720 Output:      Net:  720 24 Hr.:  720  Oral Intake      In:  720  Length of Stay Totals Intake:  7050 Output:  4750    Net:  2300   Brief Assessment:  GEN well developed, well nourished, no acute distress, appearing much more comfortable   Respiratory normal resp effort   Additional Physical Exam GU: circumcised phallus with persistent marked, but improving penoscrotal edema extending to the mons pubis region and minimally toward the perineum (no further extension in the mons or perineum - slightly improved) -53mm area of superficial epidermal necrosis right inferior scrotum with yellow discharge. Still no crepitus or erythema. No malodor.   Lab Results: Routine Hem:  05-Jul-14 04:21   WBC (CBC)  13.4  RBC (CBC)  4.30  Hemoglobin (CBC) 13.0  Hematocrit (CBC)  38.5  Platelet Count (CBC) 204  MCV 90  MCH 30.3  MCHC 33.8  RDW 14.0  Neutrophil % 75.1  Lymphocyte % 9.3  Monocyte % 14.3  Eosinophil % 0.8  Basophil % 0.5  Neutrophil #  10.1  Lymphocyte # 1.3  Monocyte #  1.9  Eosinophil # 0.1  Basophil # 0.1 (Result(s) reported on 05 Sep 2012 at 05:56AM.)   Assessment/Plan:  Assessment/Plan:   Assessment Right Epididymo-orchitis (on [place]) with marked penoscrotal edema - improving on IV Zosyn with decreased leukocytosis -remains without clinical evidence for Fournier's Gangrene -the right inferior/dependent scrotum is spontaneously draining - still no indication for surgical drainage   Plan Maintain Scrotal Elevation (place a roll under the scrotum). Continue IV Zosyn. Warm compresses to the right inferior scrotum tid.  Will continue to follow with you.   Electronic Signatures: Darcella Cheshire (MD)  (Signed 05-Jul-14 09:38)  Authored: Chief Complaint, VITAL SIGNS/ANCILLARY NOTES, Brief Assessment, Lab Results, Assessment/Plan   Last Updated: 05-Jul-14 09:38 by Darcella Cheshire (MD)

## 2014-06-24 NOTE — Consult Note (Signed)
PATIENT NAME:  Cody Vaughan MR#:  [id number] DATE OF BIRTH:  1960/03/06  DATE OF CONSULTATION:  09/03/2012  CONSULTING PHYSICIAN:  Denice Bors. Jacqlyn Larsen, MD  HISTORY: Cody Vaughan is a 54 year old gentleman with a history of substance abuse and alcohol abuse. He also has hepatitis C. 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 He states he has been working very hard, with significant amount of sweating. He developed pain and discomfort in the testicle region. He then developed subsequent progressive swelling and subsequent fever and chills. He was admitted for further evaluation and intervention. An ultrasound was obtained demonstrating findings consistent with right epididymal orchitis. There was no evidence of abscess. There is significant thickening of the scrotal skin with no evidence of abscess. His urine culture is negative to date. He has been on IV antibiotic therapy with some improvement in the discomfort.  The edema, however, persists.  On examination, there is a 6 mm area of slight skin necrosis on the inferior aspect of the right hemiscrotal compartment. There is a small area of fluctuance just beneath this area. This does not seem to extend any further.  Extensive cellulitis is noted throughout the scrotum.  The concern is over the possible development of Fournier's gangrene. This does not appear to be absolutely apparent at this time.  Close monitoring, however, is indicated.  Discussion has been undertaken regarding these aspects with Cody Vaughan and his family. He had questions regarding multiple potential causes. This was discussed at length.   PAST MEDICAL HISTORY: Otherwise significant for hepatitis C, alcohol abuse.   PAST SURGICAL HISTORY: Significant for left shoulder surgery and subsequent thumb surgery.   ALLERGIES: No known drug allergies.   SOCIAL HISTORY: Significant for tobacco use. He has a past history of alcohol abuse. He also has a past history of cocaine use for  at least the past 8 months.   MEDICATIONS ON ADMISSION: Gabapentin 600 mg 3 times daily, ibuprofen 500 mg every 4 hours as needed, melatonin 3 mg at bedtime, trazodone 100 mg at bedtime.   PHYSICAL EXAMINATION: VITAL SIGNS: Vital signs stable.   HEENT: Within normal limits.  CHEST: Clear to auscultation bilaterally.  CARDIOVASCULAR: Regular rate and rhythm.  ABDOMEN: Soft, nontender, nondistended. No palpable masses. No appreciable CVA tenderness.  GENITOURINARY: Phallus demonstrates significant edema. There is significant edema and erythema noted throughout the entire scrotum. There is an approximate 6 to 7 mm area of superficial skin necrosis on the inferior aspect of the right hemiscrotal compartment. A small area of fluctuance is noted just underneath. This does not appear to extend any further. No expressible purulence was noted. No other abnormalities were appreciated. No increased area of erythema was noted surrounding the site.  EXTREMITIES: Free range of motion x 4.  NEUROLOGIC: Motor and sensory are grossly intact.   ASSESSMENT:   1.  Scrotal cellulitis with possible early abscess. 2.  Right epididymo-orchitis.   RECOMMENDATIONS:  Continue IV antibiotic therapy as you are currently doing.  Keep the scrotum elevated and utilize ice packs. We will have the Urology coverage follow the patient over the weekend. If there is any progression of the area of necrosis or further evidence of abscess, incision and drainage will be necessary. There is a reasonable chance, however, that this will continue to improve with proper antibiotic therapy.  If there are any further questions, please feel free to contact us.   ____________________________ Denice Bors. Jacqlyn Larsen, MD bsc:cb D: 09/03/2012 17:33:41 ET T: 09/03/2012  17:53:08 ET JOB#: [id number]  cc: Denice Bors. Jacqlyn Larsen, MD, <Dictator> Denice Bors Kalissa Grays MD ELECTRONICALLY SIGNED 09/09/2012 14:27

## 2014-06-24 NOTE — Consult Note (Signed)
Chief Complaint:  Subjective/Chief Complaint HD #3, Day #3 IV Zosyn  Pt without new complaints - persistent, but subjectively improving penoscrotal edema (tender). Pt is voiding without difficulty.   VITAL SIGNS/ANCILLARY NOTES: **Vital Signs.:   04-Jul-14 03:46  Vital Signs Type Routine  Celsius 36.9  Temperature Source oral  Pulse Pulse 72  Respirations Respirations 18  Systolic BP Systolic BP 270  Diastolic BP (mmHg) Diastolic BP (mmHg) 67  Mean BP 83  Pulse Ox % Pulse Ox % 96  Pulse Ox Activity Level  At rest  Oxygen Delivery Room Air/ 21 %  *Intake and Output.:   Daily 04-Jul-14 07:00  Grand Totals Intake:  930 Output:  2225    Net:  -1295 24 Hr.:  -1295  Oral Intake      In:  480  IV (Primary)      In:  250  IV (Secondary)      In:  200  Urine ml     Out:  2225  Length of Stay Totals Intake:  6330 Output:  4750    Net:  1580   Brief Assessment:  GEN well developed, well nourished, no acute distress, appearing uncomfortable   Respiratory normal resp effort   Additional Physical Exam GU: circumcised phallus with marked penoscrotal edema extending to the mons pubis region and minimally toward the perineum -stable, small 6-66m area of superficial epidermal necrosis right inferior scrotum without fluctuance or discharge. No crepitus or erythema. No malodor.   Lab Results: Routine Chem:  04-Jul-14 04:38   Glucose, Serum 93  BUN 12  Creatinine (comp) 1.09  Sodium, Serum 138  Potassium, Serum 3.7  Chloride, Serum 106  CO2, Serum 25  Calcium (Total), Serum 8.6  Anion Gap 7  Osmolality (calc) 275  eGFR (African American) >60  eGFR (Non-African American) >60 (eGFR values <653mmin/1.73 m2 may be an indication of chronic kidney disease (CKD). Calculated eGFR is useful in patients with stable renal function. The eGFR calculation will not be reliable in acutely ill patients when serum creatinine is changing rapidly. It is not useful in  patients on dialysis.  The eGFR calculation may not be applicable to patients at the low and high extremes of body sizes, pregnant women, and vegetarians.)  Routine Hem:  04-Jul-14 04:38   WBC (CBC)  15.3  RBC (CBC)  4.09  Hemoglobin (CBC)  12.5  Hematocrit (CBC)  36.4  Platelet Count (CBC) 171  MCV 89  MCH 30.5  MCHC 34.3  RDW 13.9  Neutrophil % 80.7  Lymphocyte % 8.1  Monocyte % 9.8  Eosinophil % 0.8  Basophil % 0.6  Neutrophil #  12.3  Lymphocyte # 1.2  Monocyte #  1.5  Eosinophil # 0.1  Basophil # 0.1 (Result(s) reported on 04 Sep 2012 at 05:30AM.)   Radiology Results: [place]   01-Jul-14 16:37, [place]   REASON FOR EXAM:    swelling, pain  COMMENTS:       PROCEDURE: [place]  - Sep 01 2012  4:37PM     RESULT: Comparison: None    Technique: Multiple gray-scale, color Doppler, and spectral Doppler   images were obtained of the testicles.    Findings:  The right testicle measures 4.1 x 3.7 x 3.4 cm. The left testicle   measures 4.6 x 3.0 x 2.7 cm. The testicles are homogeneous in   echotexture. Arterial and venous waveforms are demonstrated in bilateral   testicles. The  right epididymis appears relatively heterogeneous and     hypervascular. Correlate for epididymitis. There are several cysts in the   left epididymis. The soft tissues and skin of the scrotum appears   thickened, left greater than right. This is nonspecific.    IMPRESSION:   1. The right epididymis is heterogeneous and hypervascular. Correlate for   epididymitis.  2. Scrotal soft tissues and skin appear thickened, left greater than   right. This is nonspecific. Possibilities would include cellulitis.      DictationSite: 8        Verified By: Gregor Hams, M.D., MD   Assessment/Plan:  Assessment/Plan:  Assessment Right Epididymo-orchitis (on [place]) with marked penoscrotal edema - stable on IV Zosyn -remains without clinical evidence for Fournier's Gangrene or abscess development    Plan Maintain Scrotal Elevation (place a roll under the scrotum). Continue IV Zosyn.  Will continue to follow with you.   Electronic Signatures: Darcella Cheshire (MD)  (Signed 04-Jul-14 15:17)  Authored: Chief Complaint, VITAL SIGNS/ANCILLARY NOTES, Brief Assessment, Lab Results, Radiology Results, Assessment/Plan   Last Updated: 04-Jul-14 15:17 by Darcella Cheshire (MD)

## 2014-06-24 NOTE — Consult Note (Signed)
Chief Complaint:  Subjective/Chief Complaint HD #5, Day #5 IV Zosyn  Pt without new complaints - continues to be subjectively improving  - marked decrease in penoscrotal edema with increased drainage of creamy, purulence with warm compresses throughout the day yesterday.   VITAL SIGNS/ANCILLARY NOTES: **Vital Signs.:   06-Jul-14 04:17  Vital Signs Type Routine  Temperature Temperature (F) 98.5  Celsius 36.9  Temperature Source oral  Pulse Pulse 61  Respirations Respirations 18  Systolic BP Systolic BP 275  Diastolic BP (mmHg) Diastolic BP (mmHg) 66  Mean BP 80  Pulse Ox % Pulse Ox % 94  Pulse Ox Activity Level  At rest  Oxygen Delivery Room Air/ 21 %  *Intake and Output.:   Daily 06-Jul-14 07:00  Grand Totals Intake:  840 Output:  1250    Net:  -30 24 Hr.:  -410  Oral Intake      In:  840  Urine ml     Out:  1250  Length of Stay Totals Intake:  7890 Output:  6000    Net:  1890   Brief Assessment:  GEN well developed, well nourished, no acute distress   Respiratory normal resp effort   Additional Physical Exam GU: circumcised phallus with significant decrease in penoscrotal edema - resolving in the mons pubis region and toward the perineum. -29m area of epidermal necrosis right inferior scrotum with creamy, yellow discharge expressable   Lab Results: Routine Chem:  06-Jul-14 04:29   Glucose, Serum  105  BUN 9  Creatinine (comp) 0.97  Sodium, Serum 142  Potassium, Serum 3.5  Chloride, Serum 106  CO2, Serum 28  Calcium (Total), Serum 8.5  Anion Gap 8  Osmolality (calc) 282  eGFR (African American) >60  eGFR (Non-African American) >60 (eGFR values <647mmin/1.73 m2 may be an indication of chronic kidney disease (CKD). Calculated eGFR is useful in patients with stable renal function. The eGFR calculation will not be reliable in acutely ill patients when serum creatinine is changing rapidly. It is not useful in  patients on dialysis. The eGFR calculation may  not be applicable to patients at the low and high extremes of body sizes, pregnant women, and vegetarians.)  Routine Hem:  06-Jul-14 04:29   WBC (CBC) 9.7  RBC (CBC)  4.16  Hemoglobin (CBC)  12.9  Hematocrit (CBC)  36.9  Platelet Count (CBC) 224  MCV 89  MCH 31.0  MCHC 34.9  RDW 14.1  Neutrophil % 61.9  Lymphocyte % 18.2  Monocyte % 16.6  Eosinophil % 2.5  Basophil % 0.8  Neutrophil # 6.0  Lymphocyte # 1.8  Monocyte #  1.6  Eosinophil # 0.2  Basophil # 0.1 (Result(s) reported on 06 Sep 2012 at 05:32AM.)   Assessment/Plan:  Assessment/Plan:  Assessment Right Epididymo-orchitis (on [place] marked penoscrotal edema - continues to improve on IV Zosyn with spontaneous drainage of associated abscess - remains afebrile with resolution of leukocytosis -remains without clinical evidence for Fournier's Gangrene -no indication for surgical drainage as the purulence is draining well spontaneously   Plan Maintain Scrotal Elevation (place a roll under the scrotum). Continue IV Zosyn. d/c warm compresses to the right inferior scrotum tid - start NS wet to dry dressing changes q8hrs  Will continue to follow with you - Dr. CoJacqlyn Larsenill resume care at 5am Monday morning.   Electronic Signatures: KiDarcella CheshireMD)  (Signed 06-Jul-14 09:27)  Authored: Chief Complaint, VITAL SIGNS/ANCILLARY NOTES, Brief Assessment, Lab Results, Assessment/Plan   Last Updated: 06-Jul-14  09:27 by Darcella Cheshire (MD)

## 2020-05-27 ENCOUNTER — Emergency Department
Admission: EM | Admit: 2020-05-27 | Discharge: 2020-05-27 | Disposition: A | Discharge status: Home / Self Care | Payer: Self-pay | Attending: Emergency Medicine | Admitting: Emergency Medicine

## 2020-05-27 ENCOUNTER — Emergency Department: Payer: Self-pay

## 2020-05-27 ENCOUNTER — Other Ambulatory Visit: Payer: Self-pay

## 2020-05-27 DIAGNOSIS — F17200 Nicotine dependence, unspecified, uncomplicated: Secondary | ICD-10-CM | POA: Insufficient documentation

## 2020-05-27 DIAGNOSIS — R748 Abnormal levels of other serum enzymes: Secondary | ICD-10-CM | POA: Insufficient documentation

## 2020-05-27 DIAGNOSIS — Z20822 Contact with and (suspected) exposure to covid-19: Secondary | ICD-10-CM | POA: Insufficient documentation

## 2020-05-27 DIAGNOSIS — C7951 Secondary malignant neoplasm of bone: Secondary | ICD-10-CM

## 2020-05-27 DIAGNOSIS — M545 Low back pain, unspecified: Secondary | ICD-10-CM | POA: Insufficient documentation

## 2020-05-27 DIAGNOSIS — R531 Weakness: Secondary | ICD-10-CM | POA: Insufficient documentation

## 2020-05-27 DIAGNOSIS — R509 Fever, unspecified: Secondary | ICD-10-CM | POA: Insufficient documentation

## 2020-05-27 DIAGNOSIS — C61 Malignant neoplasm of prostate: Secondary | ICD-10-CM | POA: Insufficient documentation

## 2020-05-27 LAB — COMPREHENSIVE METABOLIC PANEL
ALT: 26 U/L (ref 0–44)
AST: 54 U/L — ABNORMAL HIGH (ref 15–41)
Albumin: 3.7 g/dL (ref 3.5–5.0)
Alkaline Phosphatase: 1148 U/L — ABNORMAL HIGH (ref 38–126)
Anion gap: 9 (ref 5–15)
BUN: 12 mg/dL (ref 8–23)
CO2: 27 mmol/L (ref 22–32)
Calcium: 9.1 mg/dL (ref 8.9–10.3)
Chloride: 100 mmol/L (ref 98–111)
Creatinine, Ser: 0.78 mg/dL (ref 0.61–1.24)
GFR, Estimated: >60 mL/min (ref >60)
Glucose, Bld: 94 mg/dL (ref 70–99)
Potassium: 4.2 mmol/L (ref 3.5–5.1)
Sodium: 136 mmol/L (ref 135–145)
Total Bilirubin: 0.7 mg/dL (ref 0.3–1.2)
Total Protein: 8.2 g/dL — ABNORMAL HIGH (ref 6.5–8.1)

## 2020-05-27 LAB — CBC WITH DIFFERENTIAL/PLATELET
Abs Immature Granulocytes: 0.1 10*3/uL — ABNORMAL HIGH (ref 0.00–0.07)
Basophils Absolute: 0.1 10*3/uL (ref 0.0–0.1)
Basophils Relative: 1 %
Eosinophils Absolute: 0.2 10*3/uL (ref 0.0–0.5)
Eosinophils Relative: 2 %
HCT: 39.8 % (ref 39.0–52.0)
Hemoglobin: 13.3 g/dL (ref 13.0–17.0)
Immature Granulocytes: 1 %
Lymphocytes Relative: 27 %
Lymphs Abs: 2.5 10*3/uL (ref 0.7–4.0)
MCH: 29.3 pg (ref 26.0–34.0)
MCHC: 33.4 g/dL (ref 30.0–36.0)
MCV: 87.7 fL (ref 80.0–100.0)
Monocytes Absolute: 0.9 10*3/uL (ref 0.1–1.0)
Monocytes Relative: 10 %
Neutro Abs: 5.4 10*3/uL (ref 1.7–7.7)
Neutrophils Relative %: 59 %
Platelets: 322 10*3/uL (ref 150–400)
RBC: 4.54 MIL/uL (ref 4.22–5.81)
RDW: 14.5 % (ref 11.5–15.5)
WBC: 9.1 10*3/uL (ref 4.0–10.5)
nRBC: 0 % (ref 0.0–0.2)

## 2020-05-27 LAB — SARS CORONAVIRUS 2 (TAT 6-24 HRS): SARS Coronavirus 2: NEGATIVE

## 2020-05-27 LAB — LACTIC ACID, PLASMA: Lactic Acid, Venous: 1.2 mmol/L (ref 0.5–1.9)

## 2020-05-27 LAB — TROPONIN I (HIGH SENSITIVITY): Troponin I (High Sensitivity): 4 ng/L (ref <18)

## 2020-05-27 IMAGING — DX DG CHEST 1V PORT
1 series · 1 of 1 positions shown · non-contrast
Comparison: None.

CLINICAL DATA: Fever and myalgia

EXAM:
PORTABLE CHEST 1 VIEW

[chest ap]
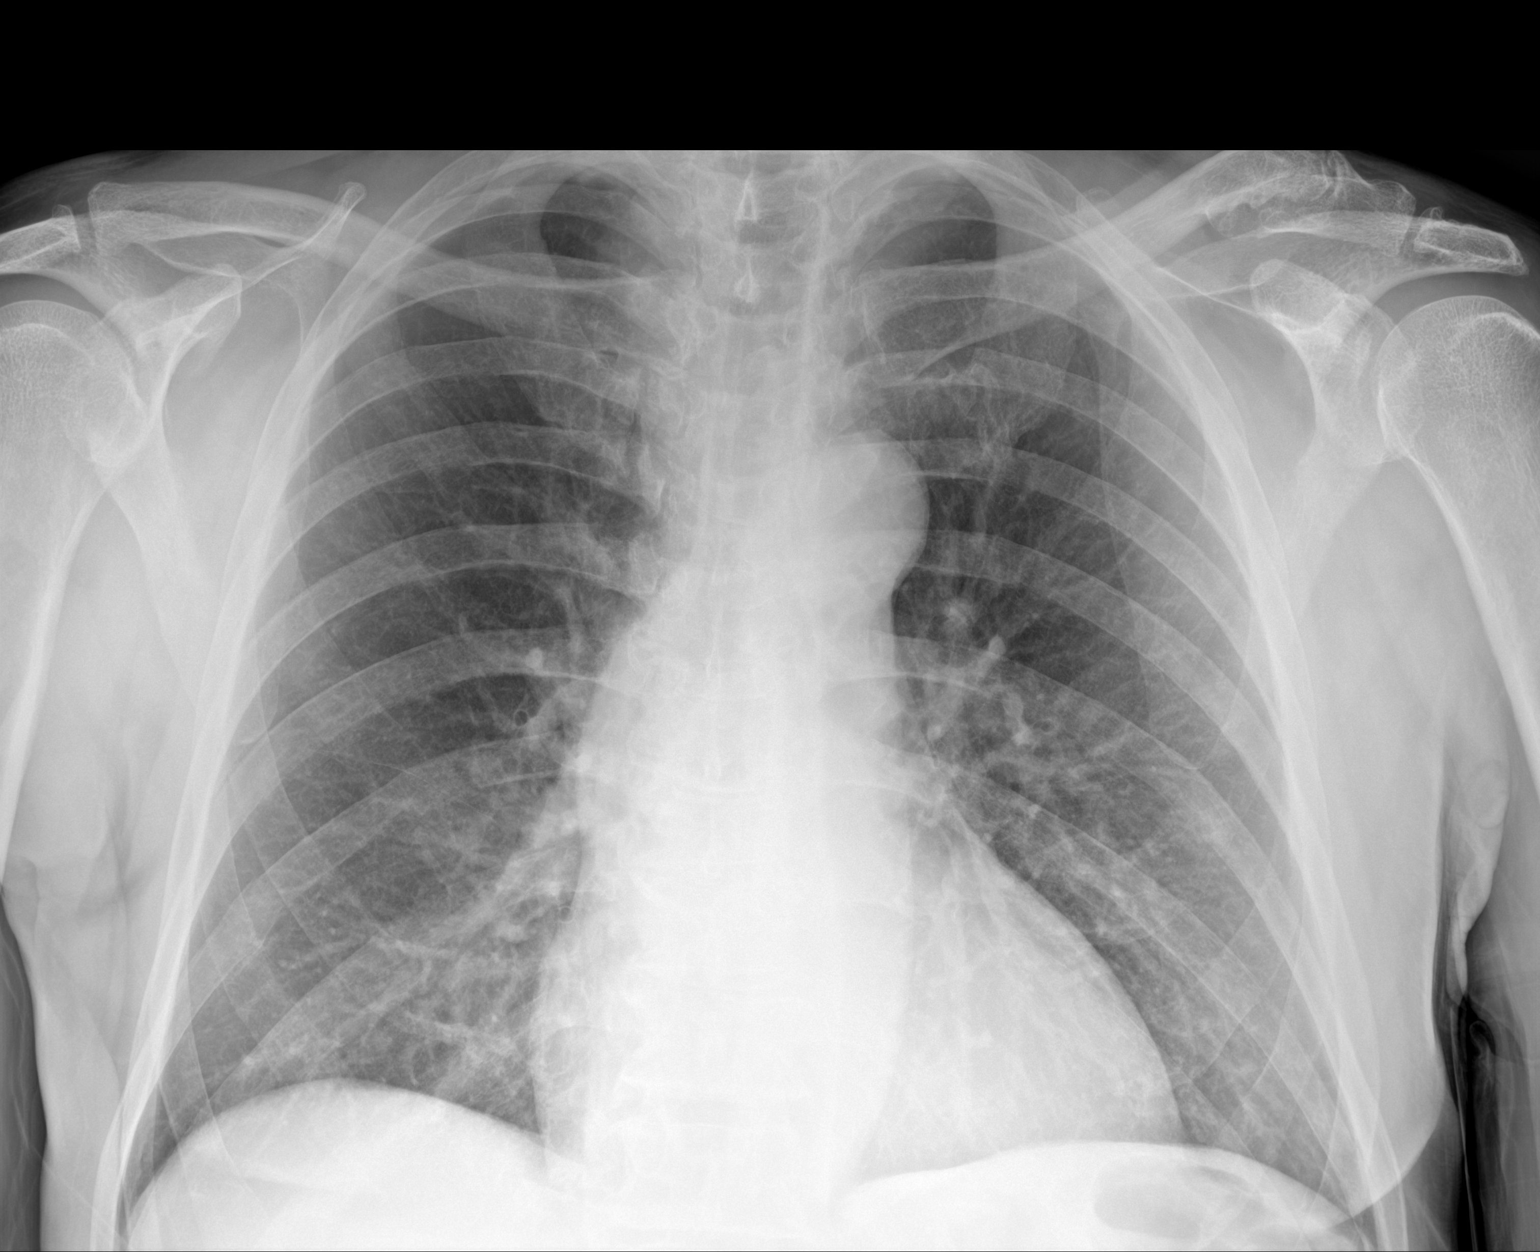

[1 of 1 positions shown; findings below may reference images not displayed]

FINDINGS: Normal heart size and mediastinal contours. No acute infiltrate or
edema. No effusion or pneumothorax. No acute osseous findings.
Remote left distal clavicle fracture with hypertrophic attempted
healing.
IMPRESSION: Negative for pneumonia.

## 2020-05-27 IMAGING — CT CT ABD-PELV W/ CM
2 of 5 series · 16 of 46 positions shown, 18 images · IV contrast (APPLIED)
Comparison: None.

CLINICAL DATA: Right upper quadrant abdominal pain. Elevated
alkaline phosphatase

EXAM:
CT ABDOMEN AND PELVIS WITH CONTRAST
TECHNIQUE: Multidetector CT imaging of the abdomen and pelvis was performed
using the standard protocol following bolus administration of
intravenous contrast.
CONTRAST:  100mL OMNIPAQUE IOHEXOL 300 MG/ML  SOLN

[Series 2: routine abd/pel with · axial · 0.81mm/px · z∈[-507,-132]mm · 13 of 86 slices shown, 15 images]
[im 6/86  soft-tissue]
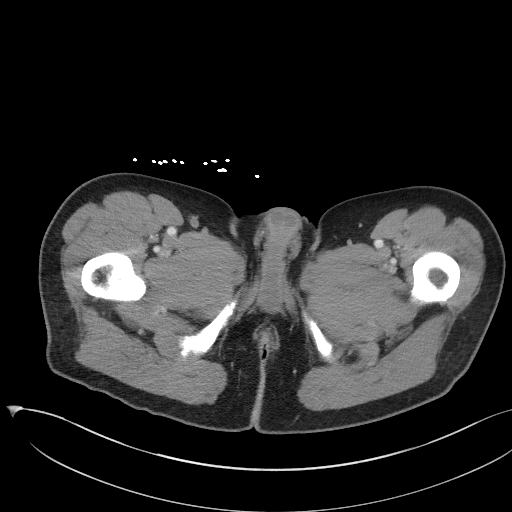
[im 6/86  bone]
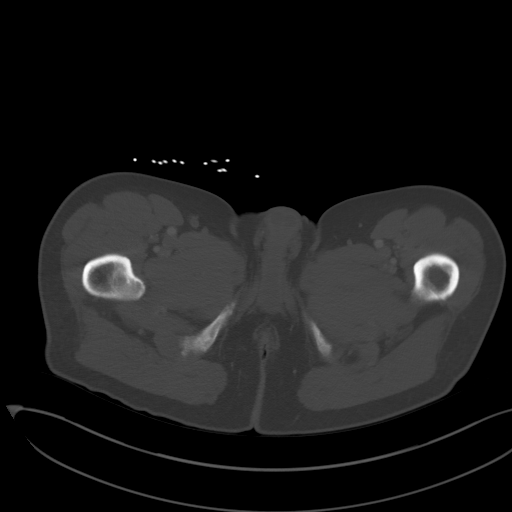
[im 11/86  soft-tissue]
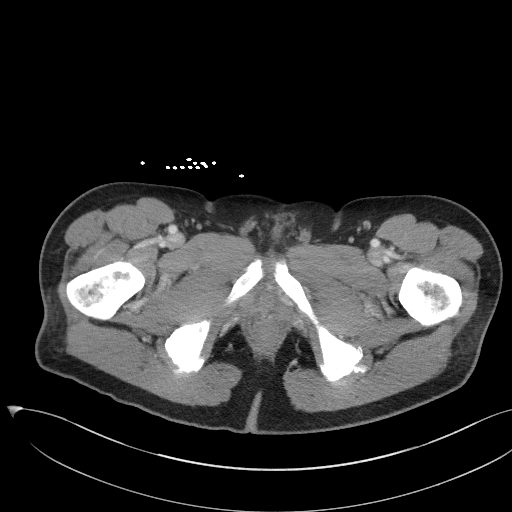
[im 21/86  soft-tissue]
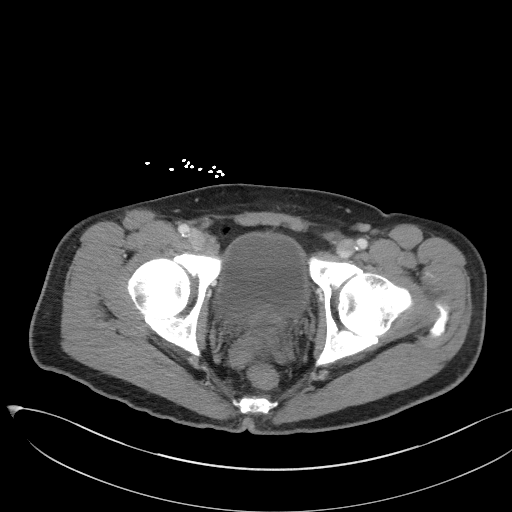
[im 26/86  soft-tissue]
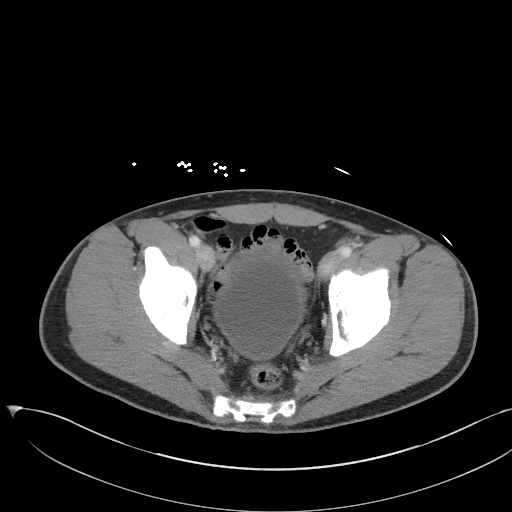
[im 31/86  soft-tissue]
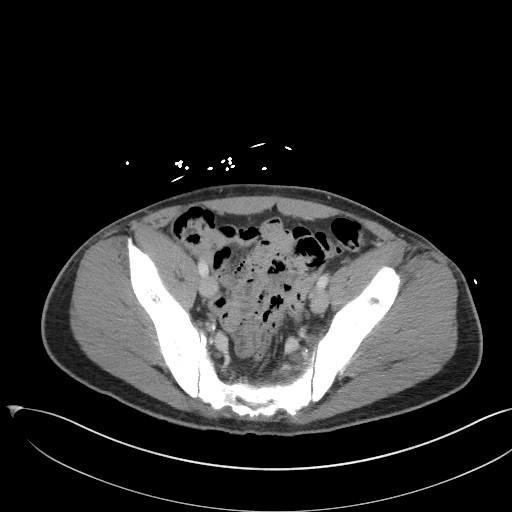
[im 36/86  soft-tissue]
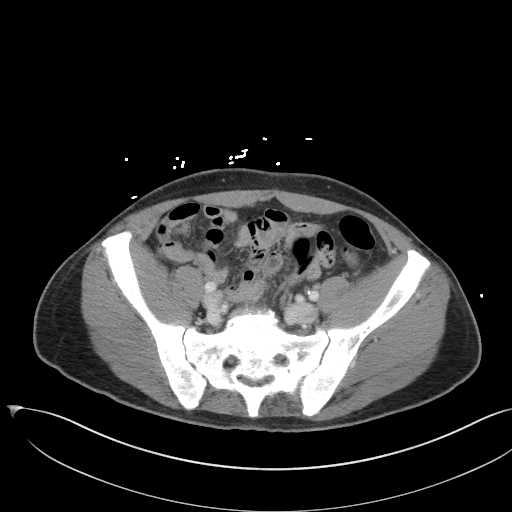
[im 46/86  soft-tissue]
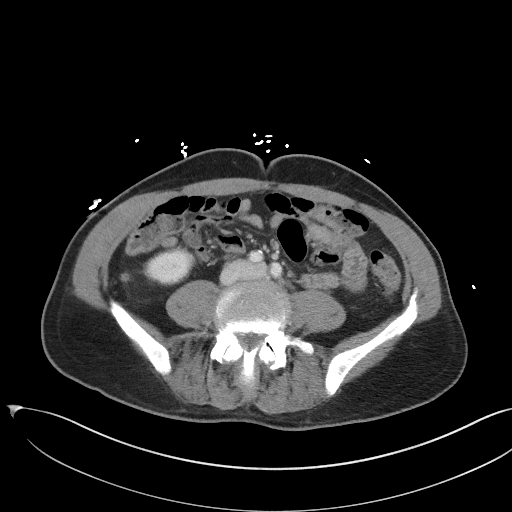
[im 51/86  soft-tissue]
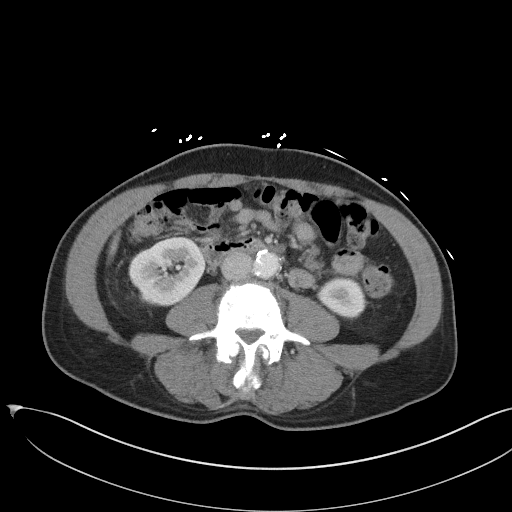
[im 56/86  soft-tissue]
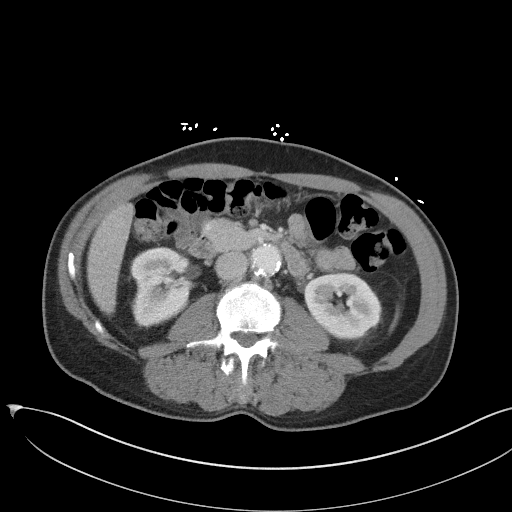
[im 56/86  bone]
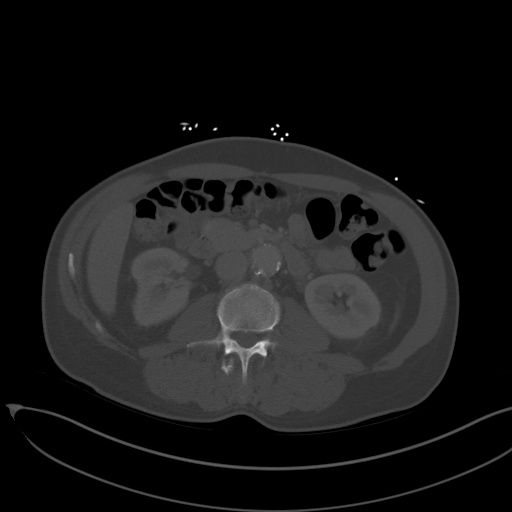
[im 61/86  soft-tissue]
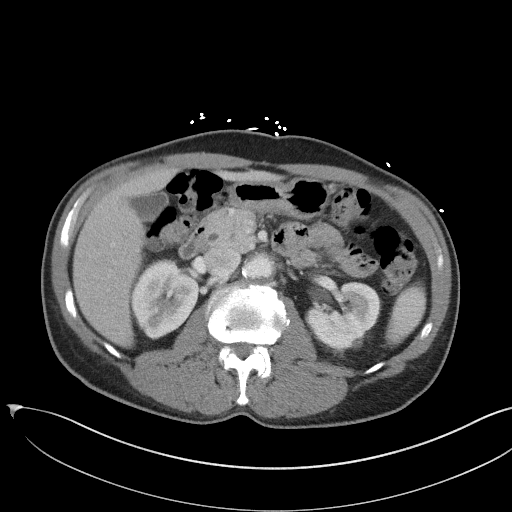
[im 66/86  soft-tissue]
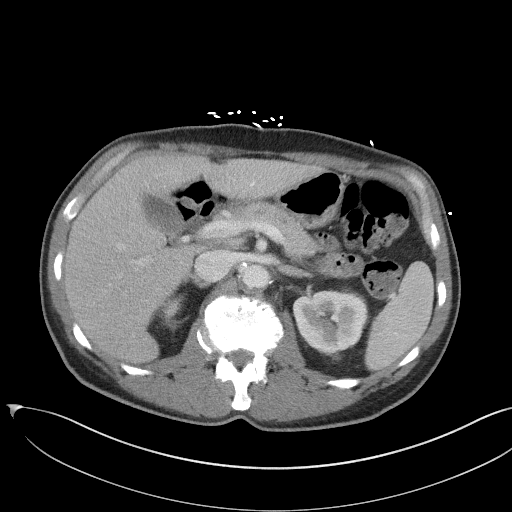
[im 76/86  soft-tissue]
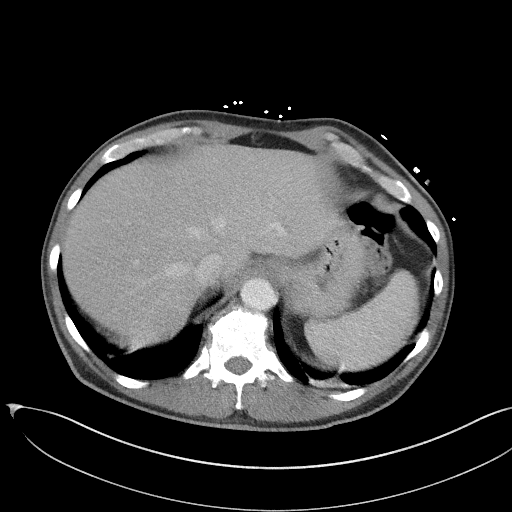
[im 81/86  soft-tissue]
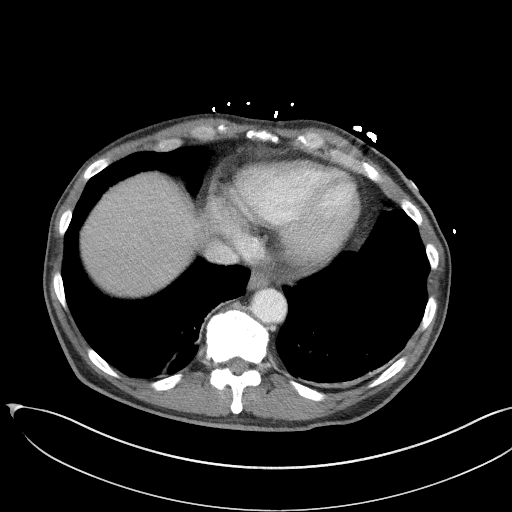

[Series 5: coronal st · coronal · 0.75mm/px · 3 of 90 slices shown]
[im 30/90  soft-tissue]
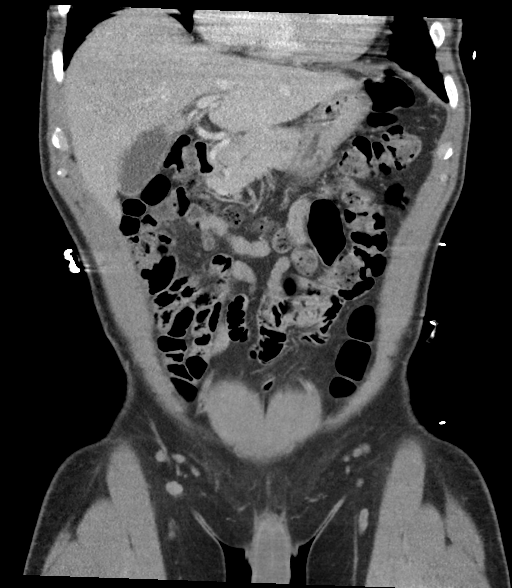
[im 40/90  soft-tissue]
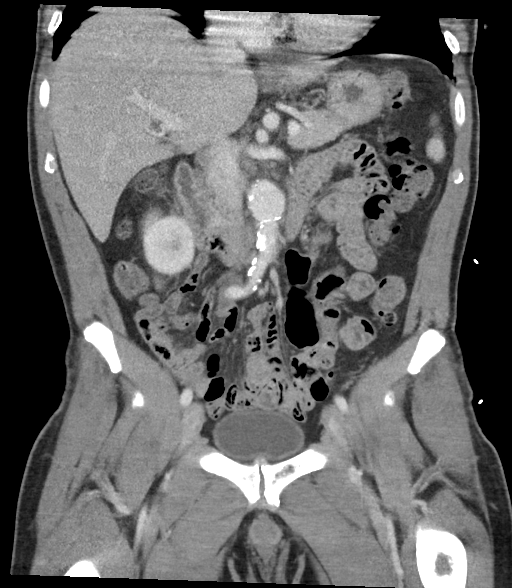
[im 50/90  soft-tissue]
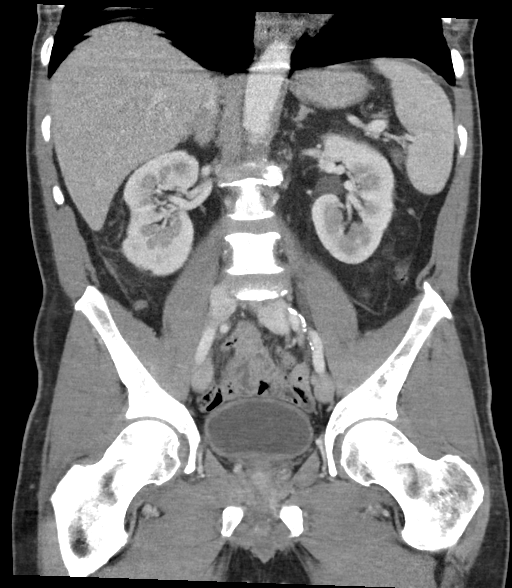

[16 of 46 positions shown; findings below may reference images not displayed]

FINDINGS: Lower chest: Bibasilar subsegmental atelectasis. Heart size within
normal limits.

Hepatobiliary: No focal liver abnormality is seen. No gallstones,
gallbladder wall thickening, or biliary dilatation.

Pancreas: Unremarkable. No pancreatic ductal dilatation or
surrounding inflammatory changes.

Spleen: Normal in size without focal abnormality.

Adrenals/Urinary Tract: Unremarkable adrenal glands. Area of
cortical scarring within the midpole of the right kidney. Kidneys
have otherwise symmetric enhancement. No renal lesion, stone, or
hydronephrosis. Urinary bladder is within normal limits.

Stomach/Bowel: Stomach appears within normal limits. No dilated
loops of small bowel. Slightly hyperenhancing appendix with
periappendiceal fat stranding (series 2, images 39-46). No focal
colonic wall thickening.

Vascular/Lymphatic: Fusiform infrarenal abdominal aortic aneurysm
measuring 3.5 cm in maximal AP dimension. Extensive calcified and
noncalcified atherosclerotic plaque throughout the aorta. No
abdominopelvic lymphadenopathy is identified.

Reproductive: Heterogeneous although nonenlarged prostate gland.

Other: No free fluid. No abdominopelvic fluid collection. No
pneumoperitoneum. No abdominal wall hernia.

Musculoskeletal: Diffusely patchy sclerotic appearance of the
osseous structures. Mild superior endplate compression deformity of
L4 with less than 10% vertebral body height loss, age indeterminate.
IMPRESSION: 1. Findings suspicious for early acute uncomplicated appendicitis.
2. Diffusely patchy sclerotic appearance of the osseous structures
suspicious for osseous metastatic disease. Given patient
demographics, metastatic prostate cancer could be potential
etiology. Prostate gland is heterogeneous in appearance. Correlate
with serum PSA.
3. Mild superior endplate compression deformity of L4 with less than
10% vertebral body height loss, age indeterminate. Correlate for
point tenderness.
4. Fusiform infrarenal abdominal aortic aneurysm measuring up to
cm. Recommend follow-up ultrasound every 2 years. This
recommendation follows ACR consensus guidelines: White Paper of the
ACR Incidental Findings Committee II on Vascular Findings. [HOSPITAL] [YL]; [DATE].

Aortic Atherosclerosis ([YL]-[YL]).

## 2020-05-27 MED ORDER — ACETAMINOPHEN 500 MG PO TABS
1000.0000 mg | ORAL_TABLET | Freq: Once | ORAL | Status: AC
  Administered 2020-05-27: 1000 mg via ORAL
  Filled 2020-05-27: qty 2

## 2020-05-27 MED ORDER — LACTATED RINGERS IV BOLUS
1000.0000 mL | Freq: Once | INTRAVENOUS | Status: AC
  Administered 2020-05-27: 1000 mL via INTRAVENOUS

## 2020-05-27 MED ORDER — ONDANSETRON 4 MG PO TBDP: 4.0000 mg | ORAL_TABLET | Freq: Three times a day (TID) | ORAL | 0 refills | Status: DC | PRN

## 2020-05-27 MED ORDER — OXYCODONE HCL 5 MG PO TABS: 5.0000 mg | ORAL_TABLET | Freq: Three times a day (TID) | ORAL | 0 refills | Status: DC | PRN

## 2020-05-27 MED ORDER — IOHEXOL 300 MG/ML  SOLN
100.0000 mL | Freq: Once | INTRAMUSCULAR | Status: AC | PRN
  Administered 2020-05-27: 100 mL via INTRAVENOUS

## 2020-05-27 NOTE — ED Notes (Signed)
Patient transported to CT 

## 2020-05-27 NOTE — ED Triage Notes (Signed)
Pt states he has been having body aches and weakness x1 week- denies n/v/d- pt had a fever last night of 100.6 and pt states he has been having night sweats

## 2020-05-27 NOTE — Discharge Instructions (Signed)
As we discussed, I am most concerned with the possibility of prostate cancer that has spread to the bone.  I have attached the information to follow-up with her local oncologist (cancer doctor), urologist (prostate/bladder doctor) and a PCP.  You are being discharged with 2 prescriptions: Oxycodone pain medication to use as needed on top of Tylenol, Zofran nausea medicine to use as needed for any nauseous sensations and to help with your appetite.  Use Tylenol for pain and fevers.  Up to 1000 mg per dose, up to 4 times per day.  Do not take more than 4000 mg of Tylenol/acetaminophen within 24 hours..  If you develop any worsening symptoms despite these medications, please return to the ED.

## 2020-05-27 NOTE — ED Provider Notes (Signed)
[facility] Emergency Department Provider Note ____________________________________________   Event Date/Time   First MD Initiated Contact with Patient 05/27/20 [id number]     (approximate)  I have reviewed the triage vital signs and the nursing notes.  HISTORY  Chief Complaint Weakness   HPI KY RUMPLE is a 60 y.o. malewho presents to the ED for evaluation of generalized weakness and low-grade fever.  Chart review indicates no significant past medical history.  Patient presents to the ED with his wife, provides additional history, for evaluation of subacute generalized weakness and now with low-grade fevers.  Wife provides majority of history.  She indicates that patient is healthy, working every day in lawn care/yard maintenance, takes no prescription medications.  They show me a bottle of clindamycin, prescribed 3/14 for 1 week duration, due to a tooth abscess.  They report compliance with his medications, but despite this his symptoms have been progressively worsening.  She reports that he is just so weak and not acting himself, because he has come home from work early for the past few days.  She reports patient had a fever last night to 100.6 F orally.  He has been sweating at night.   Patient reports progressively worsening generalized weakness.  He reports acute on chronic right-sided lumbar pain without trauma, falls or injury.  He reports frequent lifting mechanisms at work.  Denies IVDU.  Beyond back pain and weakness, is also reporting poor p.o. intake without abdominal pain, nausea or emesis.  Denies stool changes, urinary changes, dysuria, syncope, falls, chest pain, abdominal pain, shortness of breath or cough.  Denies increased sputum production.  He reports an 8 pound weight loss over the past few weeks.   History reviewed. No pertinent past medical history.  There are no problems to display for this patient.   Past Surgical History:  Procedure  Laterality Date  . Leonore    Prior to Admission medications   Medication Sig Start Date End Date Taking? Authorizing Provider  lidocaine (XYLOCAINE) 2 % solution Use as directed 5 mLs in the mouth or throat every 4 (four) hours as needed for pain. 05/15/20  Yes [provider]  ondansetron (ZOFRAN ODT) 4 MG disintegrating tablet Take 1 tablet (4 mg total) by mouth every 8 (eight) hours as needed for nausea or vomiting. 05/27/20  Yes Vladimir Crofts, MD  oxyCODONE (ROXICODONE) 5 MG immediate release tablet Take 1 tablet (5 mg total) by mouth every 8 (eight) hours as needed. 05/27/20 05/27/21 Yes Vladimir Crofts, MD  clindamycin (CLEOCIN) 300 MG capsule Take 300 mg by mouth every 6 (six) hours. 05/15/20   [provider]  ibuprofen (ADVIL) 800 MG tablet Take 800 mg by mouth 3 (three) times daily. 05/15/20   [provider]    Allergies Patient has no known allergies.  No family history on file.  Social History Social History   Tobacco Use  . Smoking status: Current Every Day Smoker    Packs/day: 1.00  . Smokeless tobacco: Never Used    Review of Systems  Constitutional: Positive for fevers and generalized weakness. Eyes: No visual changes. ENT: No sore throat. Cardiovascular: Denies chest pain. Respiratory: Denies shortness of breath. Gastrointestinal: No abdominal pain.  No nausea, no vomiting.  No diarrhea.  No constipation. Genitourinary: Negative for dysuria. Musculoskeletal: Positive for back pain. Skin: Negative for rash. Neurological: Negative for headaches, focal weakness or numbness. ____________________________________________   PHYSICAL EXAM:  VITAL SIGNS:  Vitals:   05/27/20 1130 05/27/20 1349  BP: 102/72 110/84  Pulse: 64 79  Resp: 18 18  Temp:  98.6 F (37 C)  SpO2: 97% 99%     Constitutional: Alert and oriented.  Appears lethargic.  He is conversational, though a little slow to respond.  Moves all 4 extremities  without apparent deficit. Eyes: Conjunctivae are normal. PERRL. EOMI. Head: Atraumatic. Nose: No congestion/rhinnorhea. Mouth/Throat: Mucous membranes are dry.  Oropharynx non-erythematous. Poor dentition with carious teeth.  The tooth in question that reportedly had an abscess, tooth #20, is without fracture or periapical swelling to suggest recurrent abscess. No firmness, induration to floor of the mouth or neck.  Nontender tooth and nontender mandible. Uvula is midline Neck: No stridor. No cervical spine tenderness to palpation. Cardiovascular: Normal rate, regular rhythm. Grossly normal heart sounds.  Good peripheral circulation. Respiratory: Normal respiratory effort.  No retractions. Lungs CTAB. Gastrointestinal: Soft , nondistended, nontender to palpation. No CVA tenderness. Musculoskeletal: No lower extremity tenderness nor edema.  No joint effusions. No signs of acute trauma. Neurologic:  Normal speech and language. No gross focal neurologic deficits are appreciated.  Skin:  Skin is warm, dry and intact. No rash noted. Psychiatric: Mood and affect are normal. Speech and behavior are normal.  ____________________________________________   LABS (all labs ordered are listed, but only abnormal results are displayed)  Labs Reviewed  COMPREHENSIVE METABOLIC PANEL - Abnormal; Notable for the following components:      Result Value   Total Protein 8.2 (*)    AST 54 (*)    Alkaline Phosphatase 1,148 (*)    All other components within normal limits  CBC WITH DIFFERENTIAL/PLATELET - Abnormal; Notable for the following components:   Abs Immature Granulocytes 0.10 (*)    All other components within normal limits  SARS CORONAVIRUS 2 (TAT 6-24 HRS)  LACTIC ACID, PLASMA  URINALYSIS, COMPLETE (UACMP) WITH MICROSCOPIC  TROPONIN I (HIGH SENSITIVITY)   ____________________________________________  12 Lead EKG  Sinus rhythm, rate of 70 bpm.  Normal axis and intervals.  No evidence of  acute ischemia. ____________________________________________  RADIOLOGY  ED MD interpretation: CT abdomen/pelvis reviewed by me with bony sclerotic changes concerning for metastases.  No SBO.  Official radiology report(s): CT ABDOMEN PELVIS W CONTRAST  Result Date: 05/27/2020 CLINICAL DATA:  Right upper quadrant abdominal pain. Elevated alkaline phosphatase EXAM: CT ABDOMEN AND PELVIS WITH CONTRAST TECHNIQUE: Multidetector CT imaging of the abdomen and pelvis was performed using the standard protocol following bolus administration of intravenous contrast. CONTRAST:  154mL OMNIPAQUE IOHEXOL 300 MG/ML  SOLN COMPARISON:  None. FINDINGS: Lower chest: Bibasilar subsegmental atelectasis. Heart size within normal limits. Hepatobiliary: No focal liver abnormality is seen. No gallstones, gallbladder wall thickening, or biliary dilatation. Pancreas: Unremarkable. No pancreatic ductal dilatation or surrounding inflammatory changes. Spleen: Normal in size without focal abnormality. Adrenals/Urinary Tract: Unremarkable adrenal glands. Area of cortical scarring within the midpole of the right kidney. Kidneys have otherwise symmetric enhancement. No renal lesion, stone, or hydronephrosis. Urinary bladder is within normal limits. Stomach/Bowel: Stomach appears within normal limits. No dilated loops of small bowel. Slightly hyperenhancing appendix with periappendiceal fat stranding (series 2, images 39-46). No focal colonic wall thickening. Vascular/Lymphatic: Fusiform infrarenal abdominal aortic aneurysm measuring 3.5 cm in maximal AP dimension. Extensive calcified and noncalcified atherosclerotic plaque throughout the aorta. No abdominopelvic lymphadenopathy is identified. Reproductive: Heterogeneous although nonenlarged prostate gland. Other: No free fluid. No abdominopelvic fluid collection. No pneumoperitoneum. No abdominal wall hernia. Musculoskeletal: Diffusely patchy sclerotic appearance  of the osseous structures.  Mild superior endplate compression deformity of L4 with less than 10% vertebral body height loss, age indeterminate. IMPRESSION: 1. Findings suspicious for early acute uncomplicated appendicitis. 2. Diffusely patchy sclerotic appearance of the osseous structures suspicious for osseous metastatic disease. Given patient demographics, metastatic prostate cancer could be potential etiology. Prostate gland is heterogeneous in appearance. Correlate with serum PSA. 3. Mild superior endplate compression deformity of L4 with less than 10% vertebral body height loss, age indeterminate. Correlate for point tenderness. 4. Fusiform infrarenal abdominal aortic aneurysm measuring up to 3.5 cm. Recommend follow-up ultrasound every 2 years. This recommendation follows ACR consensus guidelines: White Paper of the ACR Incidental Findings Committee II on Vascular Findings. J Am Coll Radiol 2013; 10:789-794. Aortic Atherosclerosis (ICD10-I70.0). Electronically Signed   By: Davina Poke D.O.   On: 05/27/2020 12:58   DG Chest Portable 1 View  Result Date: 05/27/2020 CLINICAL DATA:  Fever and myalgia EXAM: PORTABLE CHEST 1 VIEW COMPARISON:  None. FINDINGS: Normal heart size and mediastinal contours. No acute infiltrate or edema. No effusion or pneumothorax. No acute osseous findings. Remote left distal clavicle fracture with hypertrophic attempted healing. IMPRESSION: Negative for pneumonia. Electronically Signed   By: Monte Fantasia M.D.   On: 05/27/2020 09:52    ____________________________________________   PROCEDURES and INTERVENTIONS  Procedure(s) performed (including Critical Care):  .1-3 Lead EKG Interpretation Performed by: Vladimir Crofts, MD Authorized by: Vladimir Crofts, MD     Interpretation: normal     ECG rate:  70   ECG rate assessment: normal     Rhythm: sinus rhythm     Ectopy: none     Conduction: normal      Medications  acetaminophen (TYLENOL) tablet 1,000 mg (1,000 mg Oral Given 05/27/20  1014)  lactated ringers bolus 1,000 mL (0 mLs Intravenous Stopped 05/27/20 1349)  iohexol (OMNIPAQUE) 300 MG/ML solution 100 mL (100 mLs Intravenous Contrast Given 05/27/20 1205)    ____________________________________________   MDM / ED COURSE   60 year old male presents to the ED with subacute and progressively worsening generalized weakness, night sweats and weight loss with evidence of likely metastatic prostate cancer amenable to outpatient management.  Normal vitals on room air.  Exam demonstrates an initially lethargic patient without evidence of distress, neurovascular deficits.  Has benign abdomen and is in no distress.  Blood work most notable for remarkably elevated alk phos, about 10 times the upper limit of normal.  With the symptoms of occasional right flank/RUQ pain, CT imaging was abdomen/pelvis obtained due to high suspicion for likely intra-abdominal malignancy causing his symptoms.  Imaging with evidence of diffuse sclerotic changes concerning for metastases, and with heterogenous prostate as likely source of primary malignancy.  Furthermore, he has a strong family history of prostate cancer in the men in his family.  His symptoms are well controlled and I see no barriers to outpatient management.  I provide him with follow-up information for oncology, urology and a new PCP, at his request.  Provided him with oxycodone and Zofran prescriptions for symptom control at home.  We discussed following up with these various specialists and we discussed return precautions for the ED. patient stable for outpatient management.   Clinical Course as of 05/27/20 1548  Sat May 27, 2020  1113 Reassessed.  Patient holding his right-sided abdomen when I enter the room.  He reports some continued soreness to his right flank/RUQ.  I discussed with patient and wife my concerns for significantly elevated alk phos in  the setting of his pain, weight loss and generalized B symptoms.  We discussed CT  abdomen/pelvis, they are agreeable. [DS]  1320 Return to the bedside and educated patient and wife about my concerns for prostate cancer metastatic to the bone, causing his symptoms.  We discussed some degree of uncertainty, but my high suspicion for this.  We discussed close outpatient follow-up with oncology, urology and PCP.  We discussed return precautions for the ED.  We discussed symptom control at home.  Answered questions. [DS]  3241 Reexamination of his abdomen reveals nontender RLQ near McBurney's point.  Rovsing negative.  I highly doubt acute appendicitis [DS]    Clinical Course User Index [DS] Vladimir Crofts, MD    ____________________________________________   FINAL CLINICAL IMPRESSION(S) / ED DIAGNOSES  Final diagnoses:  Generalized weakness  Abnormal serum level of alkaline phosphatase  Prostate cancer metastatic to bone [facility])     ED Discharge Orders         Ordered    oxyCODONE (ROXICODONE) 5 MG immediate release tablet  Every 8 hours PRN        05/27/20 1325    ondansetron (ZOFRAN ODT) 4 MG disintegrating tablet  Every 8 hours PRN        05/27/20 1325           Shallon Yaklin   Note:  This document was prepared using Systems analyst and may include unintentional dictation errors.   Vladimir Crofts, MD 05/27/20 [phone]

## 2020-05-27 NOTE — ED Triage Notes (Signed)
First Nurse Note:  Arrives c/o fever.  Reports recent tooth abscess and taking antibiotics for infection.  States started to run a fever yesterday, 100.7, and had night sweats last night.  States "I just don't feel good"  AAOx3.  Skin warm and dry. NAD.

## 2020-06-07 ENCOUNTER — Encounter: Payer: Self-pay | Admitting: Oncology

## 2020-06-07 ENCOUNTER — Inpatient Hospital Stay: Payer: Self-pay

## 2020-06-07 ENCOUNTER — Inpatient Hospital Stay: Payer: Self-pay | Attending: Oncology | Admitting: Oncology

## 2020-06-07 VITALS — BP 105/66 | HR 85 | Temp 97.8°F

## 2020-06-07 DIAGNOSIS — C61 Malignant neoplasm of prostate: Secondary | ICD-10-CM | POA: Insufficient documentation

## 2020-06-07 DIAGNOSIS — R748 Abnormal levels of other serum enzymes: Secondary | ICD-10-CM

## 2020-06-07 DIAGNOSIS — R9389 Abnormal findings on diagnostic imaging of other specified body structures: Secondary | ICD-10-CM

## 2020-06-07 DIAGNOSIS — Z8249 Family history of ischemic heart disease and other diseases of the circulatory system: Secondary | ICD-10-CM | POA: Insufficient documentation

## 2020-06-07 DIAGNOSIS — M899 Disorder of bone, unspecified: Secondary | ICD-10-CM | POA: Insufficient documentation

## 2020-06-07 DIAGNOSIS — R948 Abnormal results of function studies of other organs and systems: Secondary | ICD-10-CM | POA: Insufficient documentation

## 2020-06-07 DIAGNOSIS — C7951 Secondary malignant neoplasm of bone: Secondary | ICD-10-CM | POA: Insufficient documentation

## 2020-06-07 DIAGNOSIS — G893 Neoplasm related pain (acute) (chronic): Secondary | ICD-10-CM | POA: Insufficient documentation

## 2020-06-07 DIAGNOSIS — Z79899 Other long term (current) drug therapy: Secondary | ICD-10-CM | POA: Insufficient documentation

## 2020-06-07 DIAGNOSIS — Z7189 Other specified counseling: Secondary | ICD-10-CM | POA: Insufficient documentation

## 2020-06-07 DIAGNOSIS — F17210 Nicotine dependence, cigarettes, uncomplicated: Secondary | ICD-10-CM | POA: Insufficient documentation

## 2020-06-07 DIAGNOSIS — Z8546 Personal history of malignant neoplasm of prostate: Secondary | ICD-10-CM

## 2020-06-07 LAB — PSA: Prostatic Specific Antigen: 678 ng/mL — ABNORMAL HIGH (ref 0.00–4.00)

## 2020-06-07 LAB — GAMMA GT: GGT: 48 U/L (ref 7–50)

## 2020-06-07 LAB — LACTATE DEHYDROGENASE: LDH: 199 U/L — ABNORMAL HIGH (ref 98–192)

## 2020-06-07 MED ORDER — OXYCODONE HCL 5 MG PO TABS: 5.0000 mg | ORAL_TABLET | Freq: Four times a day (QID) | ORAL | 0 refills | Status: DC | PRN

## 2020-06-07 NOTE — Progress Notes (Signed)
Hematology/Oncology Consult note [facility] [phone] [phone]   Patient Care Team: Patient, No Pcp Per (Inactive) as PCP - General (General Practice)  REFERRING PROVIDER: Vladimir Crofts, MD  CHIEF COMPLAINTS/REASON FOR VISIT:  Evaluation of bone lesions  HISTORY OF PRESENTING ILLNESS:   Cody Vaughan is a  60 y.o.  male with PMH listed below was seen in consultation at the request of  Vladimir Crofts, MD  for evaluation of bone lesions 05/27/2020 patient presented to emergency room for evaluation of generalized weakness and and body pain.  Prior to the presentations, patient also had history of tooth abscess and was prescribed antibiotics. Reports acute on chronic right-sided lumbar pain without any prior trauma history.  Profound weakness.  Decreased appetite and loss of weight about 10 pounds during the past few months.  Also complained intermittent right upper quadrant discomfort  326 04/10/2020 CT abdomen pelvis showed a diffusely patchy sclerotic appearance of the osseous structures suspicious for osseous metastatic disease.  Prostate gland is heterogeneous in appearance.  Correlate with PSA.  Patient had a mild superior endplate compression deformity of L4 with less than 10% vertebral body height loss.  H indeterminate.  Fusiform infrarenal abdominal aortic aneurysm measuring up to 3.5 cm.  Follow-up in 2 years. Suspicious findings for early acute uncomplicated appendicitis.  Patient denies any right lower quadrant pain.  In the emergency room, he has a negative Murphy sign.   Patient was prescribed with a small supply of oxycodone and Zofran and discharged from ED.  Patient has no medical insurance and has no primary care provider. Patient was referred to establish care with hematology oncology for further evaluation of the abnormal bone lesions. He reports that he has used up his oxycodone supply.  He currently uses ibuprofen as needed. Denies  any dysuria, hesitancy, increased frequency or urgency with urination.   Review of Systems  Constitutional: Positive for appetite change, fatigue and unexpected weight change. Negative for chills and fever.       Patient sitss in the wheelchair  HENT:   Negative for hearing loss and voice change.   Eyes: Negative for eye problems and icterus.  Respiratory: Negative for chest tightness, cough and shortness of breath.   Cardiovascular: Negative for chest pain and leg swelling.  Gastrointestinal: Negative for abdominal distention and abdominal pain.  Endocrine: Negative for hot flashes.  Genitourinary: Negative for difficulty urinating, dysuria and frequency.   Musculoskeletal: Positive for back pain. Negative for arthralgias.  Skin: Negative for itching and rash.  Neurological: Negative for light-headedness and numbness.  Hematological: Negative for adenopathy. Does not bruise/bleed easily.  Psychiatric/Behavioral: Negative for confusion.    MEDICAL HISTORY:  Past Medical History:  Diagnosis Date  . Prostate cancer [facility])     SURGICAL HISTORY: Past Surgical History:  Procedure Laterality Date  . SHOULDER SURGERY     1985    SOCIAL HISTORY: Social History   Socioeconomic History  . Marital status: Married    Spouse name: Not on file  . Number of children: Not on file  . Years of education: Not on file  . Highest education level: Not on file  Occupational History  . 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 Smoking status: Current Every Day Smoker    Packs/day: 1.00  . Smokeless tobacco: Never Used  Substance and Sexual Activity  . Alcohol use: Not Currently  . Drug use: Never  .  Sexual activity: Not Currently  Other Topics Concern  . Not on file  Social History Narrative  . Not on file   Social Determinants of Health   Financial Resource Strain: Not on file  Food Insecurity: Not on file  Transportation Needs: Not on  file  Physical Activity: Not on file  Stress: Not on file  Social Connections: Not on file  Intimate Partner Violence: Not on file    FAMILY HISTORY: Family History  Problem Relation Age of Onset  . COPD Mother   . Heart attack Father     ALLERGIES:  has No Known Allergies.  MEDICATIONS:  Current Outpatient Medications  Medication Sig Dispense Refill  . ibuprofen (ADVIL) 800 MG tablet Take 800 mg by mouth 3 (three) times daily.    . magnesium 30 MG tablet Take 30 mg by mouth 2 (two) times daily.    . Multiple Vitamins-Minerals (MULTIVITAMIN WITH MINERALS) tablet Take 1 tablet by mouth daily.    Marland Kitchen oxyCODONE (OXY IR/ROXICODONE) 5 MG immediate release tablet Take 1 tablet (5 mg total) by mouth every 6 (six) hours as needed for severe pain. 60 tablet 0  . potassium chloride (KLOR-CON) 10 MEQ tablet Take 10 mEq by mouth 2 (two) times daily.    Marland Kitchen lidocaine (XYLOCAINE) 2 % solution Use as directed 5 mLs in the mouth or throat every 4 (four) hours as needed for pain. (Patient not taking: Reported on 06/07/2020)     No current facility-administered medications for this visit.     PHYSICAL EXAMINATION: ECOG PERFORMANCE STATUS: 2 - Symptomatic, <50% confined to bed Vitals:   06/07/20 1143  BP: 105/66  Pulse: 85  Temp: 97.8 F (36.6 C)  SpO2: 100%   There were no vitals filed for this visit.  Physical Exam Constitutional:      General: He is not in acute distress. HENT:     Head: Normocephalic and atraumatic.  Eyes:     General: No scleral icterus. Cardiovascular:     Rate and Rhythm: Normal rate and regular rhythm.     Heart sounds: Normal heart sounds.  Pulmonary:     Effort: Pulmonary effort is normal. No respiratory distress.     Breath sounds: No wheezing.  Abdominal:     General: Bowel sounds are normal. There is no distension.     Palpations: Abdomen is soft.  Musculoskeletal:        General: No deformity. Normal range of motion.     Cervical back: Normal range of  motion and neck supple.  Skin:    General: Skin is warm and dry.     Findings: No erythema or rash.  Neurological:     Mental Status: He is alert and oriented to person, place, and time. Mental status is at baseline.     Cranial Nerves: No cranial nerve deficit.     Coordination: Coordination normal.  Psychiatric:        Mood and Affect: Mood normal.     LABORATORY DATA:  I have reviewed the data as listed Lab Results  Component Value Date   WBC 9.1 05/27/2020   HGB 13.3 05/27/2020   HCT 39.8 05/27/2020   MCV 87.7 05/27/2020   PLT 322 05/27/2020   Recent Labs    05/27/20 1000  NA 136  K 4.2  CL 100  CO2 27  GLUCOSE 94  BUN 12  CREATININE 0.78  CALCIUM 9.1  GFRNONAA >60  PROT 8.2*  ALBUMIN 3.7  AST  54*  ALT 26  ALKPHOS 1,148*  BILITOT 0.7   Iron/TIBC/Ferritin/ %Sat No results found for: IRON, TIBC, FERRITIN, IRONPCTSAT    RADIOGRAPHIC STUDIES: I have personally reviewed the radiological images as listed and agreed with the findings in the report. CT ABDOMEN PELVIS W CONTRAST  Result Date: 05/27/2020 CLINICAL DATA:  Right upper quadrant abdominal pain. Elevated alkaline phosphatase EXAM: CT ABDOMEN AND PELVIS WITH CONTRAST TECHNIQUE: Multidetector CT imaging of the abdomen and pelvis was performed using the standard protocol following bolus administration of intravenous contrast. CONTRAST:  179m OMNIPAQUE IOHEXOL 300 MG/ML  SOLN COMPARISON:  None. FINDINGS: Lower chest: Bibasilar subsegmental atelectasis. Heart size within normal limits. Hepatobiliary: No focal liver abnormality is seen. No gallstones, gallbladder wall thickening, or biliary dilatation. Pancreas: Unremarkable. No pancreatic ductal dilatation or surrounding inflammatory changes. Spleen: Normal in size without focal abnormality. Adrenals/Urinary Tract: Unremarkable adrenal glands. Area of cortical scarring within the midpole of the right kidney. Kidneys have otherwise symmetric enhancement. No renal  lesion, stone, or hydronephrosis. Urinary bladder is within normal limits. Stomach/Bowel: Stomach appears within normal limits. No dilated loops of small bowel. Slightly hyperenhancing appendix with periappendiceal fat stranding (series 2, images 39-46). No focal colonic wall thickening. Vascular/Lymphatic: Fusiform infrarenal abdominal aortic aneurysm measuring 3.5 cm in maximal AP dimension. Extensive calcified and noncalcified atherosclerotic plaque throughout the aorta. No abdominopelvic lymphadenopathy is identified. Reproductive: Heterogeneous although nonenlarged prostate gland. Other: No free fluid. No abdominopelvic fluid collection. No pneumoperitoneum. No abdominal wall hernia. Musculoskeletal: Diffusely patchy sclerotic appearance of the osseous structures. Mild superior endplate compression deformity of L4 with less than 10% vertebral body height loss, age indeterminate. IMPRESSION: 1. Findings suspicious for early acute uncomplicated appendicitis. 2. Diffusely patchy sclerotic appearance of the osseous structures suspicious for osseous metastatic disease. Given patient demographics, metastatic prostate cancer could be potential etiology. Prostate gland is heterogeneous in appearance. Correlate with serum PSA. 3. Mild superior endplate compression deformity of L4 with less than 10% vertebral body height loss, age indeterminate. Correlate for point tenderness. 4. Fusiform infrarenal abdominal aortic aneurysm measuring up to 3.5 cm. Recommend follow-up ultrasound every 2 years. This recommendation follows ACR consensus guidelines: White Paper of the ACR Incidental Findings Committee II on Vascular Findings. J Am Coll Radiol 2013; 10:789-794. Aortic Atherosclerosis (ICD10-I70.0). Electronically Signed   By: NDavina PokeD.O.   On: 05/27/2020 12:58   DG Chest Portable 1 View  Result Date: 05/27/2020 CLINICAL DATA:  Fever and myalgia EXAM: PORTABLE CHEST 1 VIEW COMPARISON:  None. FINDINGS: Normal  heart size and mediastinal contours. No acute infiltrate or edema. No effusion or pneumothorax. No acute osseous findings. Remote left distal clavicle fracture with hypertrophic attempted healing. IMPRESSION: Negative for pneumonia. Electronically Signed   By: JMonte FantasiaM.D.   On: 05/27/2020 09:52      ASSESSMENT & PLAN:  1. Elevated alkaline phosphatase level   2. Abnormal CT scan   3. Bone lesion   4. Goals of care, counseling/discussion    #Images were reviewed and discussed with patient.  Concerning bone lesions due to metastatic disease from solid tumor. Check PSA, multiple myeloma panel, LDH, beta-2 microglobulin..  Recommend patient to also check CT chest with contrast.  Alkaline phosphatase elevation.  Likely bone derived.  Check GGT. PSA is elevated at 678.  I will refer patient to establish care with urology for prostate biopsy.  Generalized body pain/back pain Recommend oxycodone 5 mg every 6 hours as needed.  I sent prescription to patient's pharmacy.  Lack of insurance, will refer to social worker  Orders Placed This Encounter  Procedures  . CT CHEST W CONTRAST    Standing Status:   Future    Standing Expiration Date:   06/07/2021    Order Specific Question:   If indicated for the ordered procedure, I authorize the administration of contrast media per Radiology protocol    Answer:   Yes    Order Specific Question:   Preferred imaging location?    Answer:   [facility]    Order Specific Question:   Radiology Contrast Protocol - do NOT remove file path    Answer:   [email or web address]  . PSA    Standing Status:   Future    Number of Occurrences:   1    Standing Expiration Date:   06/07/2021  . Gamma GT    Standing Status:   Future    Number of Occurrences:   1    Standing Expiration Date:   06/07/2021  . Lactate dehydrogenase    Standing Status:   Future    Number of Occurrences:   1    Standing Expiration Date:    06/07/2021  . Multiple Myeloma Panel (SPEP&IFE w/QIG)    Standing Status:   Future    Number of Occurrences:   1    Standing Expiration Date:   06/07/2021  . Kappa/lambda light chains    Standing Status:   Future    Number of Occurrences:   1    Standing Expiration Date:   06/07/2021  . Beta 2 microglobulin, serum    Standing Status:   Future    Number of Occurrences:   1    Standing Expiration Date:   06/07/2021  . Amb Referral to Palliative Care    Referral Priority:   Routine    Referral Type:   Consultation    Number of Visits Requested:   1    All questions were answered. The patient knows to call the clinic with any problems questions or concerns.  cc Vladimir Crofts, MD    Return of visit: To be determined Thank you for this kind referral and the opportunity to participate in the care of this patient. A copy of today's note is routed to referring provider    Earlie Server, MD, PhD Hematology Oncology [facility] Pager- [phone] 06/07/2020

## 2020-06-08 ENCOUNTER — Telehealth: Payer: Self-pay

## 2020-06-08 ENCOUNTER — Other Ambulatory Visit: Payer: Self-pay

## 2020-06-08 DIAGNOSIS — M899 Disorder of bone, unspecified: Secondary | ICD-10-CM

## 2020-06-08 DIAGNOSIS — R972 Elevated prostate specific antigen [PSA]: Secondary | ICD-10-CM

## 2020-06-08 LAB — KAPPA/LAMBDA LIGHT CHAINS
Kappa free light chain: 25.7 mg/L — ABNORMAL HIGH (ref 3.3–19.4)
Kappa, lambda light chain ratio: 1.18 (ref 0.26–1.65)
Lambda free light chains: 21.7 mg/L (ref 5.7–26.3)

## 2020-06-08 LAB — BETA 2 MICROGLOBULIN, SERUM: Beta-2 Microglobulin: 2.2 mg/L (ref 0.6–2.4)

## 2020-06-08 NOTE — Telephone Encounter (Signed)
Called pt, no answer. No DPR. LVM advising pt that we have tentatively scheduled him for 06/14/20 at 3:15. Advised pt to call back for Biopsy instructions and to confirm appt.

## 2020-06-08 NOTE — Telephone Encounter (Signed)
-----   Message from Billey Co, MD sent at 06/08/2020  8:09 AM EDT ----- Regarding: PSA 700 Oki-  He likely has prostate cancer, can we get him in within the next week or so for a office visit with same day prostate biopsy? Thanks  Nickolas Madrid, MD 06/08/2020   ----- Message ----- From: Earlie Server, MD Sent: 06/07/2020  10:55 PM EDT To: Billey Co, MD, Evelina Dun, RN, #  Please refer patient to establish care with Urology Dr.Sninsky.- elevated PSA with bone lesion. Need biopsy. Thanks Cc Dr.Sninisky

## 2020-06-09 LAB — MULTIPLE MYELOMA PANEL, SERUM
Albumin SerPl Elph-Mcnc: 3.1 g/dL (ref 2.9–4.4)
Albumin/Glob SerPl: 1 (ref 0.7–1.7)
Alpha 1: 0.3 g/dL (ref 0.0–0.4)
Alpha2 Glob SerPl Elph-Mcnc: 1.1 g/dL — ABNORMAL HIGH (ref 0.4–1.0)
B-Globulin SerPl Elph-Mcnc: 1 g/dL (ref 0.7–1.3)
Gamma Glob SerPl Elph-Mcnc: 0.9 g/dL (ref 0.4–1.8)
Globulin, Total: 3.4 g/dL (ref 2.2–3.9)
IgA: 94 mg/dL (ref 61–437)
IgG (Immunoglobin G), Serum: 1073 mg/dL (ref 603–1613)
IgM (Immunoglobulin M), Srm: 158 mg/dL (ref 20–172)
Total Protein ELP: 6.5 g/dL (ref 6.0–8.5)

## 2020-06-12 ENCOUNTER — Telehealth: Payer: Self-pay

## 2020-06-12 NOTE — Telephone Encounter (Addendum)
New patient seen by Dr. Tasia Catchings on 4/6 and is currently uninsured.  Patient was provided the Education officer, [employer] at [employer] contact phone number--[phone]

## 2020-06-14 ENCOUNTER — Other Ambulatory Visit: Payer: Self-pay | Admitting: Oncology

## 2020-06-14 ENCOUNTER — Telehealth: Payer: Self-pay

## 2020-06-14 ENCOUNTER — Other Ambulatory Visit: Payer: Self-pay | Admitting: Urology

## 2020-06-14 ENCOUNTER — Ambulatory Visit
Admission: RE | Admit: 2020-06-14 | Discharge: 2020-06-14 | Disposition: A | Discharge status: Home / Self Care | Payer: Self-pay | Source: Ambulatory Visit | Attending: Oncology | Admitting: Oncology

## 2020-06-14 ENCOUNTER — Encounter: Payer: Self-pay | Admitting: Urology

## 2020-06-14 ENCOUNTER — Ambulatory Visit (INDEPENDENT_AMBULATORY_CARE_PROVIDER_SITE_OTHER): Payer: Self-pay | Admitting: Urology

## 2020-06-14 ENCOUNTER — Encounter: Payer: Self-pay | Admitting: Hospice and Palliative Medicine

## 2020-06-14 ENCOUNTER — Other Ambulatory Visit: Payer: Self-pay

## 2020-06-14 VITALS — BP 120/70 | HR 80 | Ht 71.0 in | Wt 165.0 lb

## 2020-06-14 DIAGNOSIS — M899 Disorder of bone, unspecified: Secondary | ICD-10-CM

## 2020-06-14 DIAGNOSIS — R972 Elevated prostate specific antigen [PSA]: Secondary | ICD-10-CM

## 2020-06-14 DIAGNOSIS — R748 Abnormal levels of other serum enzymes: Secondary | ICD-10-CM | POA: Insufficient documentation

## 2020-06-14 DIAGNOSIS — C61 Malignant neoplasm of prostate: Secondary | ICD-10-CM | POA: Insufficient documentation

## 2020-06-14 DIAGNOSIS — C7951 Secondary malignant neoplasm of bone: Secondary | ICD-10-CM

## 2020-06-14 DIAGNOSIS — R9389 Abnormal findings on diagnostic imaging of other specified body structures: Secondary | ICD-10-CM | POA: Insufficient documentation

## 2020-06-14 IMAGING — CT CT CHEST W/ CM
1 series · 15 of 34 positions shown, 19 images · IV contrast (omnipaque)
Comparison: Abdominopelvic CT [DATE]. One view chest
[DATE].

CLINICAL DATA: Bone pain with weakness and weight loss for several
weeks. Sclerotic osseous lesions on abdominopelvic CT suspicious for
metastatic disease.

EXAM:
CT CHEST WITH CONTRAST
TECHNIQUE: Multidetector CT imaging of the chest was performed during
intravenous contrast administration.
CONTRAST:  75mL OMNIPAQUE IOHEXOL 300 MG/ML  SOLN

[Series 2: axial st · axial · 0.74mm/px · z∈[-637,-333]mm · 15 of 180 slices shown, 19 images]
[im 14/180  mediastinal]
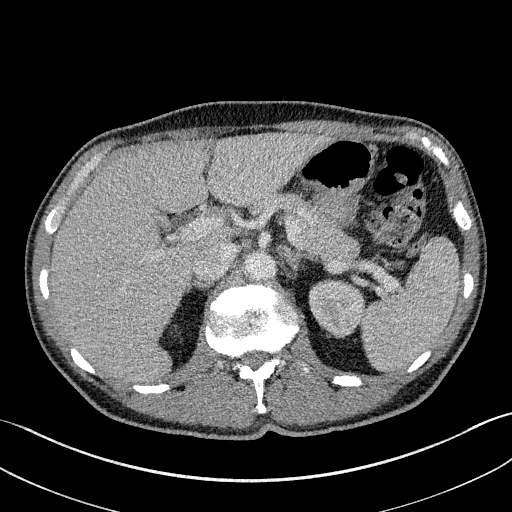
[im 14/180  lung]
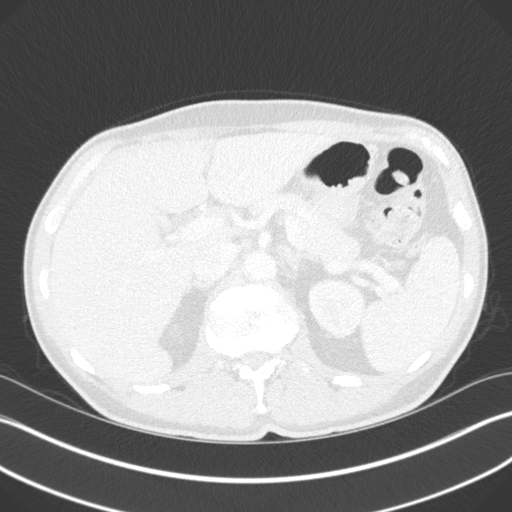
[im 27/180  lung]
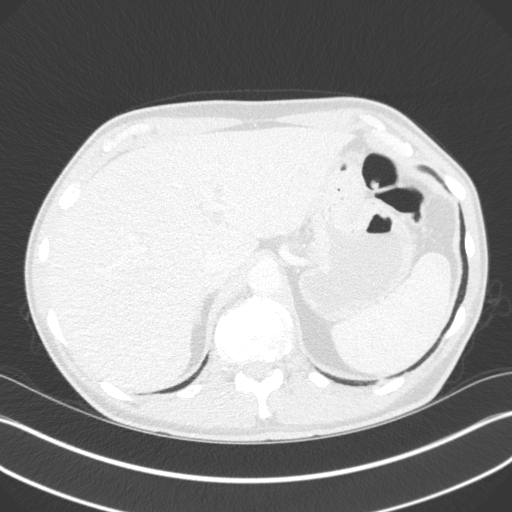
[im 36/180  lung]
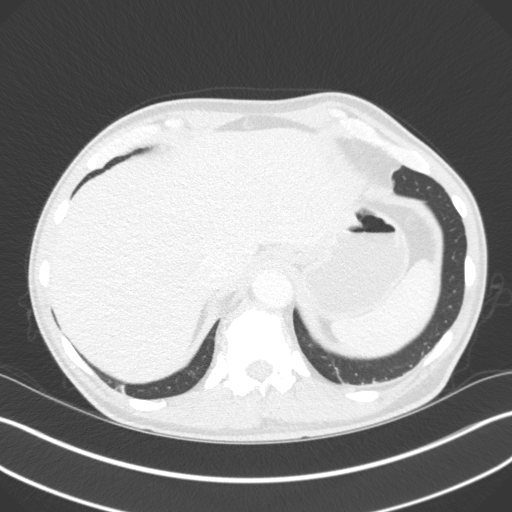
[im 47/180  lung]
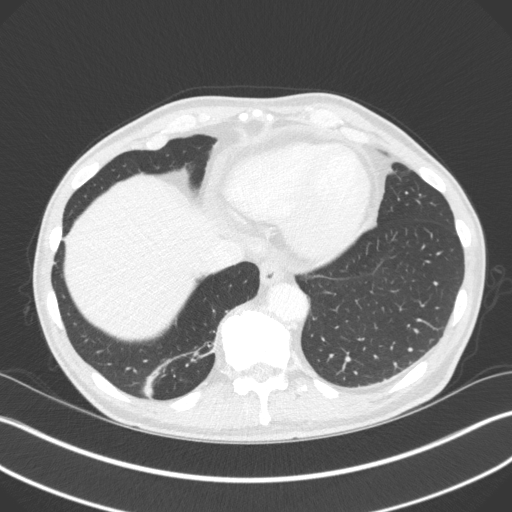
[im 60/180  mediastinal]
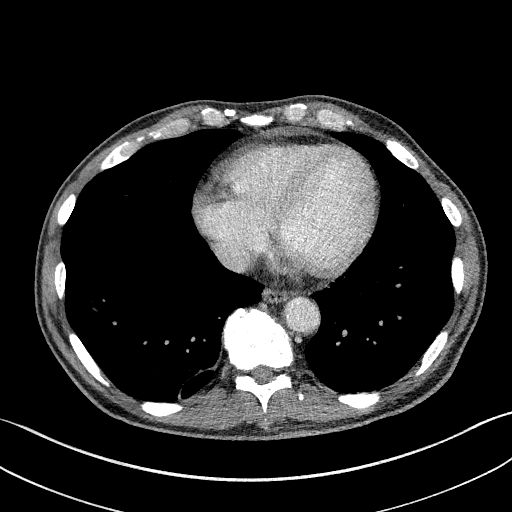
[im 60/180  lung]
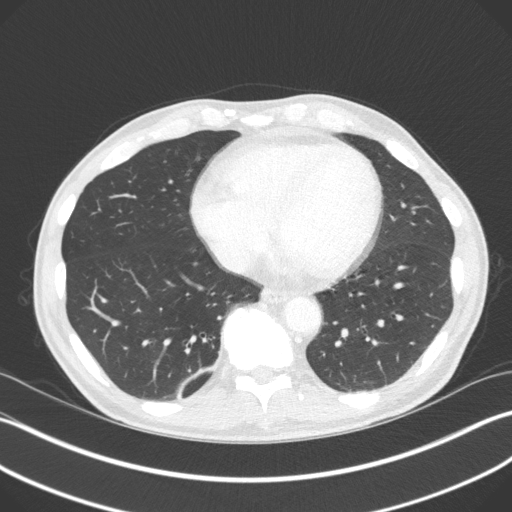
[im 72/180  lung]
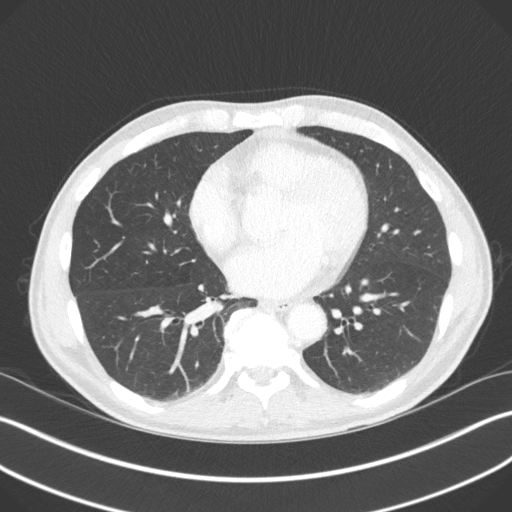
[im 80/180  lung]
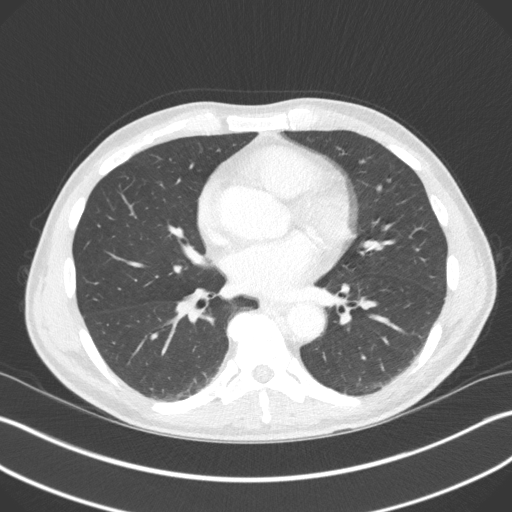
[im 93/180  lung]
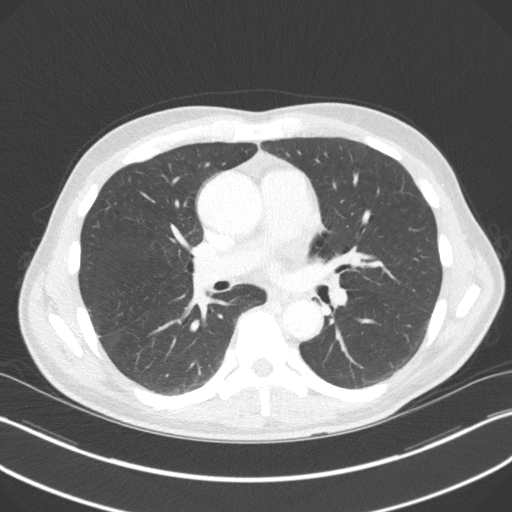
[im 100/180  mediastinal]
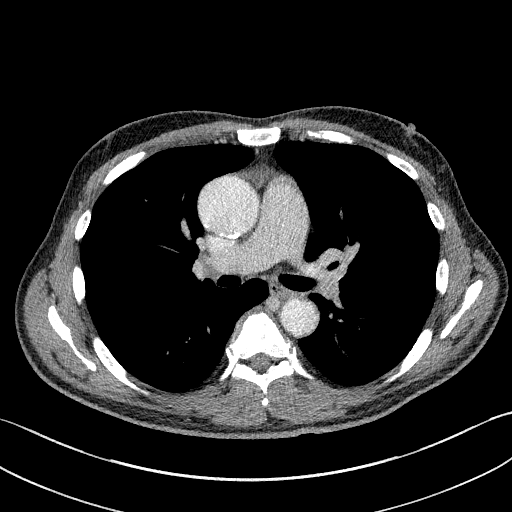
[im 100/180  lung]
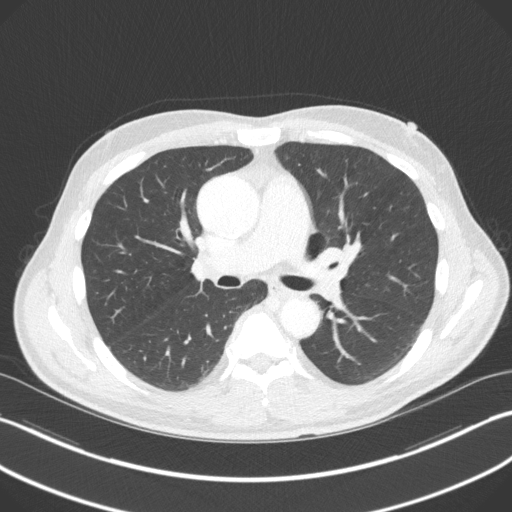
[im 108/180  lung]
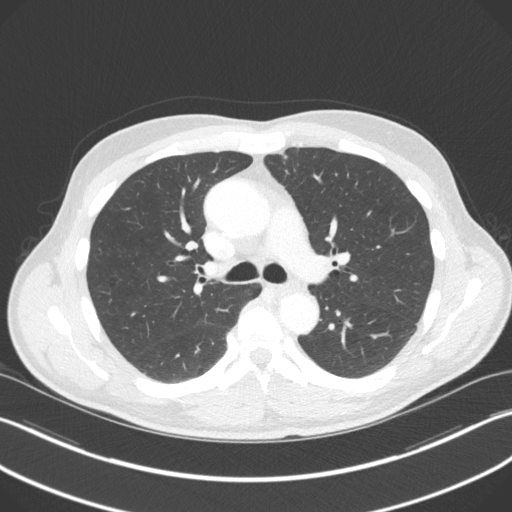
[im 120/180  lung]
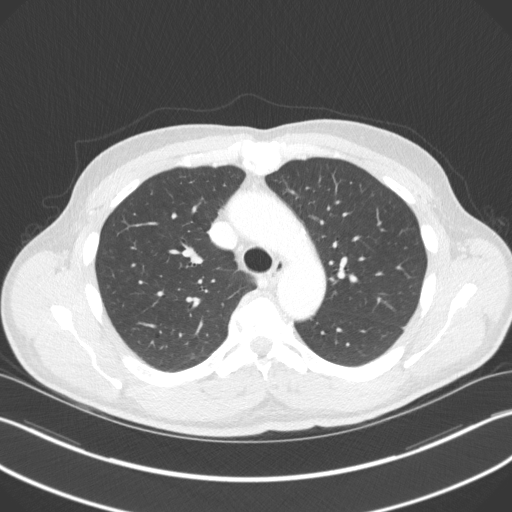
[im 133/180  lung]
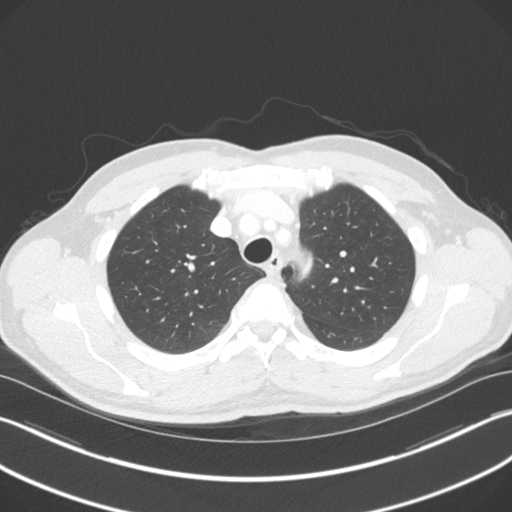
[im 144/180  mediastinal]
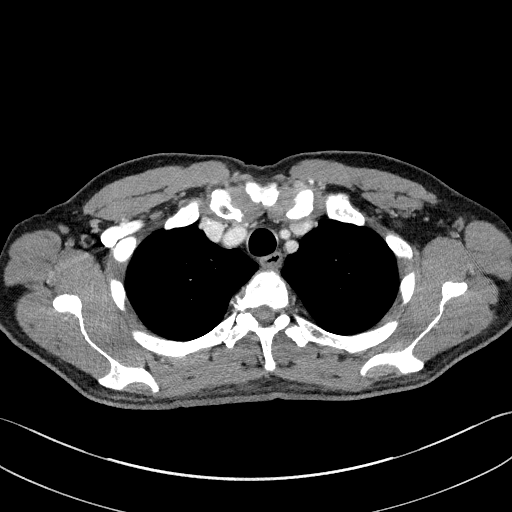
[im 144/180  lung]
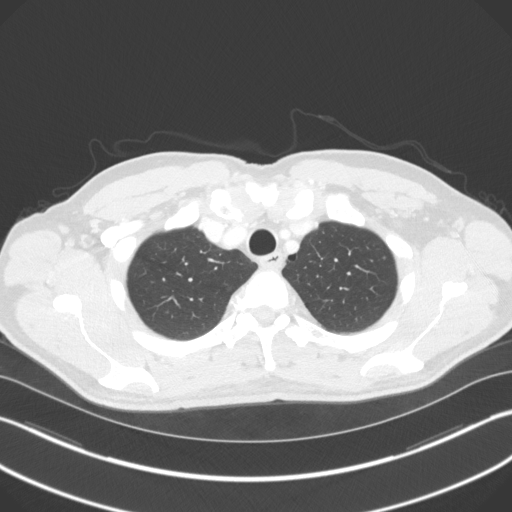
[im 153/180  lung]
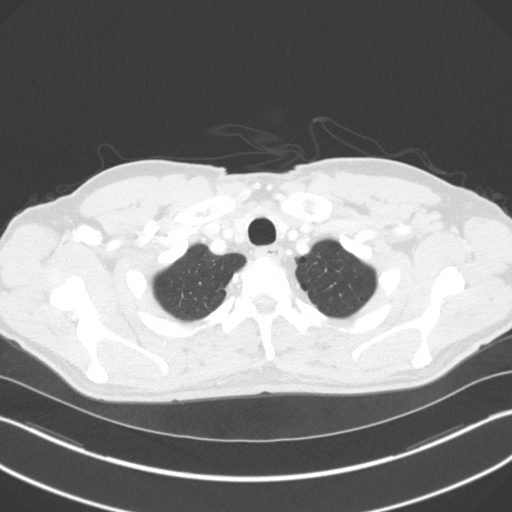
[im 166/180  lung]
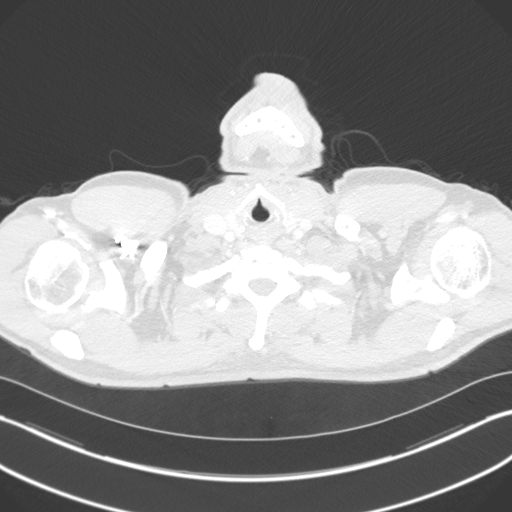

[15 of 34 positions shown; findings below may reference images not displayed]

FINDINGS: Cardiovascular: Atherosclerosis of the aorta, great vessels and
coronary arteries with mild intimal irregularity of the aortic arch.
The ascending aorta is mildly dilated, having a maximal diameter
cm. No acute vascular findings are evident. The heart size is
normal. Trace pericardial fluid.

Mediastinum/Nodes: There are no enlarged mediastinal, hilar or
axillary lymph nodes. The thyroid gland, trachea and esophagus
demonstrate no significant findings.

Lungs/Pleura: No pleural effusion or pneumothorax. Mild
centrilobular and paraseptal emphysema with scattered parenchymal
scarring. No suspicious pulmonary nodularity.

Upper abdomen: The visualized upper abdomen appears stable without
significant findings. The gallbladder is contracted.

Musculoskeletal/Chest wall: Diffuse ill-defined sclerosis noted
throughout the thoracic spine, sternum, ribs and scapula suspicious
for blastic metastatic disease. Scattered Schmorl's node formation
in the spine without evidence of acute fracture or epidural tumor.
IMPRESSION: 1. Widespread sclerosis throughout the bones of the chest suspicious
for osseous metastatic disease.
2. No evidence of extra osseous thoracic metastases or primary
neoplasm.
3. Aortic Atherosclerosis ([4G]-[4G]). Mild dilatation of the
ascending aorta. Recommend annual imaging followup by CTA or MRA.
This recommendation follows [4G]
ACCF/AHA/AATS/ACR/ASA/SCA/MEYTAL/MEYTAL/MEYTAL/MEYTAL Guidelines for the
Diagnosis and Management of Patients with Thoracic Aortic Disease.
Circulation. [4G]; 121: [id number]

## 2020-06-14 MED ORDER — IOHEXOL 300 MG/ML  SOLN
75.0000 mL | Freq: Once | INTRAMUSCULAR | Status: AC | PRN
  Administered 2020-06-14: 75 mL via INTRAVENOUS

## 2020-06-14 NOTE — Telephone Encounter (Signed)
Per MD: I just discussed his case with urology. he will have prostate biopsy this Friday. please arrange him to have a bone scan asap. also follow up with me later next week, MD + [facility] -new. thanks.

## 2020-06-14 NOTE — H&P (View-Only) (Signed)
06/14/20 3:05 PM   Dionne Bucy 1960-06-17 [id number]  CC: Elevated PSA, suspected widely metastatic prostate cancer to bone  HPI: I saw Mr. Federici and his wife today for the above issues.  He is a 60 year old previously healthy male who was seen in the ED on 05/27/2020 with malaise, weakness, and back in right-sided rib pain.  Work-up was notable for a significantly elevated PSA of 678 and alk phos of 1148, and CT scan showed a heterogenous prostate(57 g) with patchy sclerotic appearance of the osseous structures suspicious for diffuse osseous metastatic disease, and mild superior endplate compression deformity of L4.  There was no hydronephrosis.  He has met with Dr. Tasia Catchings in oncology recently who recommended prostate biopsy prior to initiating treatment.  He had a CT chest today that is pending.  No prior PSA values to review.  No prior recent imaging to review.  He reports a family history of prostate cancer in his father.  He denies any significant urinary symptoms of weak stream, dysuria, feeling of incomplete emptying, or incontinence.  Urinalysis is benign today with 0 WBCs, 0 RBCs, no bacteria, nitrite negative, no leukocytes.  PVR is normal at 0 mL.   PMH: Past Medical History:  Diagnosis Date  . Prostate cancer [facility])    2022    Surgical History: Past Surgical History:  Procedure Laterality Date  . SHOULDER SURGERY     1985    Family History: Family History  Problem Relation Age of Onset  . COPD Mother   . Heart attack Father     Social History:  reports that he has been smoking. He has been smoking about 1.00 pack per day. He has never used smokeless tobacco. He reports previous alcohol use. He reports that he does not use drugs.  Physical Exam: BP 120/70   Pulse 80   Ht $R'5\' 11"'VT$  (1.803 m)   Wt 165 lb (74.8 kg)   BMI 23.01 kg/m    Constitutional: Frail appearing Cardiovascular: No clubbing, cyanosis, or edema. Respiratory: Normal respiratory effort, no  increased work of breathing. GI: Abdomen is soft, nontender, nondistended, no abdominal masses GU: Phallus with patent meatus, no lesions, testicles 20 cc and descended bilaterally without masses, suspected left varicocele versus epididymal cyst DRE: 60 g, diffusely firm and abnormal worrisome for malignancy   Laboratory Data: Reviewed, see HPI  Pertinent Imaging: I have personally viewed and interpreted the CT abdomen and pelvis with 57 g heterogenous prostate and concern for diffuse sclerotic metastatic disease, no hydronephrosis or lymphadenopathy  Assessment & Plan:   60 year old male with 1 month of weight loss, fatigue, and bone pain with PSA of 678 and alk phos over 1100, grossly abnormal DRE, 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  I had a very long conversation with the patient and his wife about my high suspicion for metastatic prostate cancer.  We discussed that prostate cancer can have different variants, and treatment options vary by histologic subtype.  Occasionally patients can even have more rare pathology that can mimic prostate cancer on CT, but his PSA is most consistent with prostate adenocarcinoma.  We discussed treatment strategies based on pathologic type and staging.  We will need to complete a bone scan as well.  I will set him up for a prostate biopsy this Friday for definitive diagnosis.  I messaged his oncologist Dr. Tasia Catchings with this plan, and she will plan to see him in clinic at the end of next week when  pathology is back to likely start ADT with [facility].  I will plan to call him with the prostate biopsy results.  We discussed the risks and benefits of biopsy including bleeding, infection, sepsis.  -Schedule prostate biopsy this Friday -Will call with those results -Has follow-up end of next week with Dr. Tasia Catchings with oncology to initiate treatment pending prostate biopsy pathology, she will also order a bone scan   I spent 70 total minutes on the day of  the encounter including pre-visit review of the medical record, face-to-face time with the patient, and post visit ordering of labs/imaging/tests.  Nickolas Madrid, MD 06/14/2020  [facility] [address] [place], [place] [address] [phone]

## 2020-06-14 NOTE — Patient Instructions (Signed)
Transrectal Prostate Biopsy Patient Education and Post Procedure Instructions    -Definition A prostate biopsy is the removal of a small amount of tissue from the prostate gland. The tissue is examined to determine whether there is cancer.  -Reasons for Procedure A prostate biopsy is usually done after an abnormal finding by: Digital rectal exam Prostate specific antigen (PSA) blood test A prostate biopsy is the only way to find out if cancer cells are present.  -Possible Complications Problems from the procedure are rare, but all procedures have some risk including: Infection Bruising or lengthy bleeding from the rectum, or in urine or semen Difficulty urinating Reactions to anesthesia Factors that may increase the risk of complications include: Smoking History of bleeding disorders or easy bruising Use of any medications, over-the-counter medications, or herbal supplements Sensitivity or allergy to latex, medications, or anesthesia.  -Prior to Procedure Talk to your doctor about your medications. Blood thinning medications including aspirin should be stopped 1 week prior to procedure. If prescribed by your cardiologist we may need approval before stopping medications. Use a Fleets enema 2 hours before the procedure. Can be purchased at your pharmacy. Antibiotics will be administered in the clinic prior to procedure.  Please make sure you eat a light meal prior to coming in for your appointment. This can help prevent lightheadedness during the procedure and upset stomach from antibiotics. Please bring someone with you to the procedure to drive you home.  -Anesthesia Transrectal biopsy: Local anesthesia--Just the area that is being operated on is numbed using an injectable anesthetic.  -Description of the Procedure Transrectal biopsy--Your doctor will insert a small ultrasound device into the rectum. This device will produce sound waves to create an image of the prostate.  These images will help guide placement of the needle. Your doctor will then insert the needle through the wall of the rectum and into the prostate gland. The procedure should take approximately 15-30 minutes.  -Will It Hurt? You may have discomfort and soreness at the biopsy site. Pain and discomfort after the procedure can be managed with medications.  -Postoperative Care When you return home after the procedure, do the following to help ensure a smooth recovery: Stay hydrated. Drink plenty of fluids for the next few days. Avoid difficult physical activity the day and evening of the procedure. Keep in mind that you may see blood in your urine, stool, or semen for several days. Resume any medications that were stopped when you are advised to do so.  After the sample is taken, it will be sent to a pathologist for examination under a microscope. This doctor will analyze the sample for cancer. You will be scheduled for an appointment to discuss results. If cancer is present, your doctor will work with you to develop a treatment plan.   -Call Your Doctor or Seek Immediate Medical Attention It is important to monitor your recovery. Alert your doctor to any problems. If any of the following occur, call your doctor or go to the emergency room: Fever 100.5 or greater within 1 week post procedure go directly to ER Call the office for: Blood in the urine more than 1 week or in semen for more than 6 weeks post-biopsy Pain that you cannot control with the medications you have been given Pain, burning, urgency, or frequency of urination Cough, shortness of breath, or chest pain- if severe go to ER Heavy rectal bleeding or bleeding that lasts more than 1 week after the biopsy If you  have any questions or concerns please contact our office at [facility]  [facility] [address], [place], [place] [address] [phone]      Prostate Cancer  The prostate  is a small gland (1.5 inches [3.8 cm] wide and 1 inch [2.5 cm] high) that is involved in the production of semen. It is located below a man's bladder, in front of the rectum. Prostate cancer is the abnormal growth of cells in the prostate gland. What are the causes? The exact cause of this condition is not known. What increases the risk? You are more likely to develop this condition if:  You are 59 years of age or older.  You are African American.  You have a family history of prostate cancer.  You have a family history of breast cancer. What are the signs or symptoms? Symptoms of this condition include:  A need to urinate often.  Weak or interrupted flow of urine.  Trouble starting or stopping urination.  Inability to urinate.  Blood in urine or semen.  Persistent pain or discomfort in the lower back, lower abdomen, hips, or upper thighs.  Trouble getting an erection.  Trouble emptying the bladder all the way. How is this diagnosed? This condition can be diagnosed with:  A digital rectal exam. For this exam, a health care provider inserts a gloved finger into the rectum to feel the prostate gland.  A blood test called a prostate-specific antigen (PSA) test.  A procedure in which a sample of tissue is taken from the prostate and checked under a microscope (prostate biopsy).  An imaging test called transrectal ultrasonography. Once the condition is diagnosed, tests will be done to determine how far the cancer has spread. This is called staging the cancer. Staging may involve imaging tests, such as:  A bone scan.  A CT scan.  A PET scan.  An MRI. The stages of prostate cancer are as follows:  Stage I. At this stage, the cancer is found in the prostate only. The cancer is not visible on imaging tests, and it is usually found by accident, such as during prostate surgery.  Stage II. At this stage, the cancer is more advanced than it is in stage I, but the cancer has  not spread outside the prostate.  Stage III. At this stage, the cancer has spread beyond the outer layer of the prostate to nearby tissues. The cancer may be found in the seminal vesicles, which are near the bladder and the prostate.  Stage IV. At this stage, the cancer has spread to other parts of the body, such as the lymph nodes, bones, bladder, rectum, liver, or lungs. How is this treated? Treatment for this condition depends on several factors, including the stage of the cancer, your age, personal preferences, and your overall health. Talk with your health care provider about treatment options that are recommended for you. Common treatments include:  Observation for early stage prostate cancer (active surveillance). This involves having exams, blood tests, and in some cases, more biopsies. For some men, this is the only treatment needed.  Surgery. Types of surgeries include: ? Open surgery (prostatectomy). In this surgery, a larger incision is made to remove the prostate. ? A laparoscopic prostatectomy. This is a surgery to remove the prostate and lymph nodes through several, small incisions. It is often referred to as a minimally invasive surgery. ? A robotic prostatectomy. This is laparoscopic surgery to remove the prostate and lymph nodes with the  help of robotic arms that are controlled by the surgeon. ? Orchiectomy. This is surgery to remove the testicles. ? Cryosurgery. This is surgery to freeze and destroy cancer cells.  Radiation treatment. Types of radiation treatment include: ? External beam radiation. This type aims beams of radiation from outside the body at the prostate to destroy cancerous cells. ? Brachytherapy. This type uses radioactive needles, seeds, wires, or tubes that are implanted into the prostate gland. Like external beam radiation, brachytherapy destroys cancerous cells. An advantage is that this type of radiation limits the damage to surrounding tissue and has fewer  side effects.  High-intensity, focused ultrasonography. This treatment destroys cancer cells by delivering high-energy ultrasound waves to the cancerous cells.  Chemotherapy medicines. This treatment kills cancer cells or stops them from multiplying. It kills both cancer cells and normal cells.  Targeted therapy. This treatment uses medicines to kill cancer cells without damaging normal cells.  Hormone treatment. This treatment involves taking medicines that act on one of the male hormones (testosterone): ? By stopping your body from producing testosterone. ? By blocking testosterone from reaching cancer cells. Follow these instructions at home:  Take over-the-counter and prescription medicines only as told by your health care provider.  Maintain a healthy diet.  Get plenty of sleep.  Consider joining a support group for men who have prostate cancer. Meeting with a support group may help you learn to manage the stress of having cancer.  If you have to go to the hospital, notify your cancer specialist (oncologist).  Treatment for prostate cancer may affect sexual function. Continue to have intimate moments with your partner. This may include touching, holding, hugging, and caressing.  Keep all follow-up visits as told by your health care provider. This is important. Contact a health care provider if:  You have new or increasing trouble urinating.  You have new or increasing blood in your urine.  You have new or increasing pain in your hips, back, or chest. Get help right away if:  You have weakness or numbness in your legs.  You cannot control urination or your bowel movements (incontinence).  You have chills or a fever. Summary  The prostate is a small gland that is involved in the production of semen. It is located below a man's bladder, in front of the rectum.  Prostate cancer is the abnormal growth of cells in the prostate gland.  Treatment for this condition depends  on the stage of the cancer, your age, personal preferences, and your overall health. Talk with your health care provider about treatment options that are recommended for you.  Consider joining a support group for men who have prostate cancer. Meeting with a support group may help you learn to cope with the stress of having cancer. This information is not intended to replace advice given to you by your health care provider. Make sure you discuss any questions you have with your health care provider. Document Revised: 02/02/2019 Document Reviewed: 02/02/2019 Elsevier Patient Education  2021 [employer].

## 2020-06-14 NOTE — Progress Notes (Addendum)
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 Sutch and his wife today for the above issues.  He is a 60 year old previously healthy male who was seen in the ED on 05/27/2020 with malaise, weakness, and back in right-sided rib pain.  Work-up was notable for a significantly elevated PSA of 678 and alk phos of 1148, and CT scan showed a heterogenous prostate(57 g) with patchy sclerotic appearance of the osseous structures suspicious for diffuse osseous metastatic disease, and mild superior endplate compression deformity of L4.  There was no hydronephrosis.  He has met with Dr. Tasia Catchings in oncology recently who recommended prostate biopsy prior to initiating treatment.  He had a CT chest today that is pending.  No prior PSA values to review.  No prior recent imaging to review.  He reports a family history of prostate cancer in his father.  He denies any significant urinary symptoms of weak stream, dysuria, feeling of incomplete emptying, or incontinence.  Urinalysis is benign today with 0 WBCs, 0 RBCs, no bacteria, nitrite negative, no leukocytes.  PVR is normal at 0 mL.   PMH: Past Medical History:  Diagnosis Date  . Prostate cancer [facility])    2022    Surgical History: Past Surgical History:  Procedure Laterality Date  . SHOULDER SURGERY     1985    Family History: Family History  Problem Relation Age of Onset  . COPD Mother   . Heart attack Father     Social History:  reports that he has been smoking. He has been smoking about 1.00 pack per day. He has never used smokeless tobacco. He reports previous alcohol use. He reports that he does not use drugs.  Physical Exam: BP 120/70   Pulse 80   Ht $R'5\' 11"'Nd$  (1.803 m)   Wt 165 lb (74.8 kg)   BMI 23.01 kg/m    Constitutional: Frail appearing Cardiovascular: No clubbing, cyanosis, or edema. Respiratory: Normal respiratory effort, no  increased work of breathing. GI: Abdomen is soft, nontender, nondistended, no abdominal masses GU: Phallus with patent meatus, no lesions, testicles 20 cc and descended bilaterally without masses, suspected left varicocele versus epididymal cyst DRE: 60 g, diffusely firm and abnormal worrisome for malignancy   Laboratory Data: Reviewed, see HPI  Pertinent Imaging: I have personally viewed and interpreted the CT abdomen and pelvis with 57 g heterogenous prostate and concern for diffuse sclerotic metastatic disease, no hydronephrosis or lymphadenopathy  Assessment & Plan:   60 year old male with 1 month of weight loss, fatigue, and bone pain with PSA of 678 and alk phos over 1100, grossly abnormal DRE, 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  I had a very long conversation with the patient and his wife about my high suspicion for metastatic prostate cancer.  We discussed that prostate cancer can have different variants, and treatment options vary by histologic subtype.  Occasionally patients can even have more rare pathology that can mimic prostate cancer on CT, but his PSA is most consistent with prostate adenocarcinoma.  We discussed treatment strategies based on pathologic type and staging.  We will need to complete a bone scan as well.  I will set him up for a prostate biopsy this Friday for definitive diagnosis.  I messaged his oncologist Dr. Tasia Catchings with this plan, and she will plan to see him in clinic at the end of next week when  pathology is back to likely start ADT with [facility].  I will plan to call him with the prostate biopsy results.  We discussed the risks and benefits of biopsy including bleeding, infection, sepsis.  -Schedule prostate biopsy this Friday -Will call with those results -Has follow-up end of next week with Dr. Tasia Catchings with oncology to initiate treatment pending prostate biopsy pathology, she will also order a bone scan   I spent 70 total minutes on the day of  the encounter including pre-visit review of the medical record, face-to-face time with the patient, and post visit ordering of labs/imaging/tests.  Nickolas Madrid, MD 06/14/2020  [facility] [address], [place], [place] [address] [phone]

## 2020-06-14 NOTE — Telephone Encounter (Signed)
FYI..   Waiting on Christina to respond back  or whether scan requires a pre auth or not.

## 2020-06-14 NOTE — Telephone Encounter (Signed)
After visit Wife calls back and wants to clarify if patient should be on an antibiotic. I advised that I did not see any documentation of this but would check with you to ensure we didn't miss anything.

## 2020-06-15 ENCOUNTER — Other Ambulatory Visit
Admission: RE | Admit: 2020-06-15 | Discharge: 2020-06-15 | Disposition: A | Discharge status: Home / Self Care | Payer: Self-pay | Source: Ambulatory Visit | Attending: Urology | Admitting: Urology

## 2020-06-15 ENCOUNTER — Other Ambulatory Visit: Payer: Self-pay

## 2020-06-15 ENCOUNTER — Other Ambulatory Visit: Payer: Self-pay | Admitting: Urology

## 2020-06-15 DIAGNOSIS — R972 Elevated prostate specific antigen [PSA]: Secondary | ICD-10-CM

## 2020-06-15 DIAGNOSIS — Z01812 Encounter for preprocedural laboratory examination: Secondary | ICD-10-CM | POA: Insufficient documentation

## 2020-06-15 DIAGNOSIS — Z20822 Contact with and (suspected) exposure to covid-19: Secondary | ICD-10-CM | POA: Insufficient documentation

## 2020-06-15 LAB — URINALYSIS, COMPLETE
Bilirubin, UA: NEGATIVE
Glucose, UA: NEGATIVE
Ketones, UA: NEGATIVE
Leukocytes,UA: NEGATIVE
Nitrite, UA: NEGATIVE
Protein,UA: NEGATIVE
RBC, UA: NEGATIVE
Specific Gravity, UA: <1.005 — ABNORMAL LOW (ref 1.005–1.030)
Urobilinogen, Ur: 0.2 mg/dL (ref 0.2–1.0)
pH, UA: 5 (ref 5.0–7.5)

## 2020-06-15 LAB — MICROSCOPIC EXAMINATION
Bacteria, UA: NONE SEEN
RBC, Urine: NONE SEEN /hpf (ref 0–2)
WBC, UA: NONE SEEN /hpf (ref 0–5)

## 2020-06-15 LAB — SARS CORONAVIRUS 2 (TAT 6-24 HRS): SARS Coronavirus 2: NEGATIVE

## 2020-06-15 NOTE — Telephone Encounter (Signed)
No need for antibiotic now, we will give him abx Friday IV prior to biopsy, thanks  Nickolas Madrid, MD 06/15/2020

## 2020-06-15 NOTE — Telephone Encounter (Signed)
Called pt's wife informed her of the information below. Wife gave verbal understanding.

## 2020-06-15 NOTE — Telephone Encounter (Signed)
Done... All appts were sched as requested Pts wife [name] was made aware.

## 2020-06-16 ENCOUNTER — Ambulatory Visit
Admission: RE | Admit: 2020-06-16 | Discharge: 2020-06-16 | Disposition: A | Discharge status: Home / Self Care | Payer: Self-pay | Source: Ambulatory Visit | Attending: Urology | Admitting: Urology

## 2020-06-16 ENCOUNTER — Ambulatory Visit: Payer: Self-pay | Admitting: Anesthesiology

## 2020-06-16 ENCOUNTER — Encounter: Payer: Self-pay | Admitting: Urology

## 2020-06-16 ENCOUNTER — Inpatient Hospital Stay: Payer: Self-pay | Admitting: Hospice and Palliative Medicine

## 2020-06-16 ENCOUNTER — Inpatient Hospital Stay
Admission: EM | Admit: 2020-06-16 | Discharge: 2020-06-20 | DRG: 872 | Disposition: A | Discharge status: Home Health | Payer: Self-pay | Attending: Family Medicine | Admitting: Family Medicine

## 2020-06-16 ENCOUNTER — Encounter
Admission: RE | Disposition: A | Discharge status: Home / Self Care | Payer: Self-pay | Source: Ambulatory Visit | Attending: Urology

## 2020-06-16 ENCOUNTER — Other Ambulatory Visit: Payer: Self-pay

## 2020-06-16 DIAGNOSIS — D62 Acute posthemorrhagic anemia: Secondary | ICD-10-CM | POA: Diagnosis present

## 2020-06-16 DIAGNOSIS — Z79818 Long term (current) use of other agents affecting estrogen receptors and estrogen levels: Secondary | ICD-10-CM

## 2020-06-16 DIAGNOSIS — G8918 Other acute postprocedural pain: Secondary | ICD-10-CM

## 2020-06-16 DIAGNOSIS — Z79899 Other long term (current) drug therapy: Secondary | ICD-10-CM

## 2020-06-16 DIAGNOSIS — Z8042 Family history of malignant neoplasm of prostate: Secondary | ICD-10-CM | POA: Insufficient documentation

## 2020-06-16 DIAGNOSIS — R972 Elevated prostate specific antigen [PSA]: Secondary | ICD-10-CM

## 2020-06-16 DIAGNOSIS — C61 Malignant neoplasm of prostate: Secondary | ICD-10-CM

## 2020-06-16 DIAGNOSIS — F17210 Nicotine dependence, cigarettes, uncomplicated: Secondary | ICD-10-CM | POA: Diagnosis present

## 2020-06-16 DIAGNOSIS — Z515 Encounter for palliative care: Secondary | ICD-10-CM

## 2020-06-16 DIAGNOSIS — Z8249 Family history of ischemic heart disease and other diseases of the circulatory system: Secondary | ICD-10-CM

## 2020-06-16 DIAGNOSIS — I313 Pericardial effusion (noninflammatory): Secondary | ICD-10-CM | POA: Diagnosis present

## 2020-06-16 DIAGNOSIS — Z825 Family history of asthma and other chronic lower respiratory diseases: Secondary | ICD-10-CM

## 2020-06-16 DIAGNOSIS — C7951 Secondary malignant neoplasm of bone: Secondary | ICD-10-CM | POA: Diagnosis present

## 2020-06-16 DIAGNOSIS — IMO0001 Reserved for inherently not codable concepts without codable children: Secondary | ICD-10-CM

## 2020-06-16 DIAGNOSIS — G893 Neoplasm related pain (acute) (chronic): Secondary | ICD-10-CM | POA: Diagnosis present

## 2020-06-16 DIAGNOSIS — I82409 Acute embolism and thrombosis of unspecified deep veins of unspecified lower extremity: Secondary | ICD-10-CM

## 2020-06-16 DIAGNOSIS — E860 Dehydration: Secondary | ICD-10-CM | POA: Diagnosis present

## 2020-06-16 DIAGNOSIS — N179 Acute kidney failure, unspecified: Secondary | ICD-10-CM | POA: Diagnosis present

## 2020-06-16 DIAGNOSIS — R42 Dizziness and giddiness: Secondary | ICD-10-CM

## 2020-06-16 DIAGNOSIS — I951 Orthostatic hypotension: Secondary | ICD-10-CM | POA: Diagnosis present

## 2020-06-16 DIAGNOSIS — I3139 Other pericardial effusion (noninflammatory): Secondary | ICD-10-CM | POA: Diagnosis present

## 2020-06-16 DIAGNOSIS — N451 Epididymitis: Secondary | ICD-10-CM | POA: Diagnosis present

## 2020-06-16 DIAGNOSIS — F1410 Cocaine abuse, uncomplicated: Secondary | ICD-10-CM | POA: Diagnosis present

## 2020-06-16 DIAGNOSIS — A419 Sepsis, unspecified organism: Principal | ICD-10-CM | POA: Diagnosis present

## 2020-06-16 DIAGNOSIS — E861 Hypovolemia: Secondary | ICD-10-CM | POA: Diagnosis present

## 2020-06-16 DIAGNOSIS — B1920 Unspecified viral hepatitis C without hepatic coma: Secondary | ICD-10-CM | POA: Diagnosis present

## 2020-06-16 DIAGNOSIS — R55 Syncope and collapse: Secondary | ICD-10-CM | POA: Diagnosis present

## 2020-06-16 DIAGNOSIS — R319 Hematuria, unspecified: Secondary | ICD-10-CM | POA: Diagnosis present

## 2020-06-16 DIAGNOSIS — M899 Disorder of bone, unspecified: Secondary | ICD-10-CM

## 2020-06-16 DIAGNOSIS — W19XXXA Unspecified fall, initial encounter: Secondary | ICD-10-CM | POA: Diagnosis present

## 2020-06-16 HISTORY — PX: TRANSRECTAL ULTRASOUND: SHX5146

## 2020-06-16 HISTORY — PX: PROSTATE BIOPSY: SHX241

## 2020-06-16 LAB — CBC
HCT: 31.5 % — ABNORMAL LOW (ref 39.0–52.0)
Hemoglobin: 10.4 g/dL — ABNORMAL LOW (ref 13.0–17.0)
MCH: 29 pg (ref 26.0–34.0)
MCHC: 33 g/dL (ref 30.0–36.0)
MCV: 87.7 fL (ref 80.0–100.0)
Platelets: 244 10*3/uL (ref 150–400)
RBC: 3.59 MIL/uL — ABNORMAL LOW (ref 4.22–5.81)
RDW: 15.6 % — ABNORMAL HIGH (ref 11.5–15.5)
WBC: 9.8 10*3/uL (ref 4.0–10.5)
nRBC: 0 % (ref 0.0–0.2)

## 2020-06-16 LAB — PROTIME-INR
INR: 1.1 (ref 0.8–1.2)
Prothrombin Time: 14 seconds (ref 11.4–15.2)

## 2020-06-16 LAB — COMPREHENSIVE METABOLIC PANEL
ALT: 21 U/L (ref 0–44)
AST: 33 U/L (ref 15–41)
Albumin: 3.5 g/dL (ref 3.5–5.0)
Alkaline Phosphatase: 862 U/L — ABNORMAL HIGH (ref 38–126)
Anion gap: 10 (ref 5–15)
BUN: 14 mg/dL (ref 8–23)
CO2: 24 mmol/L (ref 22–32)
Calcium: 8.9 mg/dL (ref 8.9–10.3)
Chloride: 104 mmol/L (ref 98–111)
Creatinine, Ser: 1.05 mg/dL (ref 0.61–1.24)
GFR, Estimated: >60 mL/min (ref >60)
Glucose, Bld: 134 mg/dL — ABNORMAL HIGH (ref 70–99)
Potassium: 3.5 mmol/L (ref 3.5–5.1)
Sodium: 138 mmol/L (ref 135–145)
Total Bilirubin: 0.6 mg/dL (ref 0.3–1.2)
Total Protein: 7 g/dL (ref 6.5–8.1)

## 2020-06-16 LAB — HEMOGLOBIN AND HEMATOCRIT, BLOOD
HCT: 28.7 % — ABNORMAL LOW (ref 39.0–52.0)
HCT: 26 % — ABNORMAL LOW (ref 39.0–52.0)
Hemoglobin: 9.5 g/dL — ABNORMAL LOW (ref 13.0–17.0)
Hemoglobin: 8.8 g/dL — ABNORMAL LOW (ref 13.0–17.0)

## 2020-06-16 LAB — TYPE AND SCREEN
ABO/RH(D): A POS
Antibody Screen: NEGATIVE

## 2020-06-16 LAB — HIV ANTIBODY (ROUTINE TESTING W REFLEX): HIV Screen 4th Generation wRfx: NONREACTIVE

## 2020-06-16 SURGERY — BIOPSY, PROSTATE
Anesthesia: General | Site: Prostate

## 2020-06-16 MED ORDER — SODIUM CHLORIDE 0.9 % IV BOLUS
1000.0000 mL | Freq: Once | INTRAVENOUS | Status: AC
  Administered 2020-06-16: 1000 mL via INTRAVENOUS

## 2020-06-16 MED ORDER — SEVOFLURANE IN SOLN
RESPIRATORY_TRACT | Status: AC
  Filled 2020-06-16: qty 250

## 2020-06-16 MED ORDER — ONDANSETRON HCL 4 MG/2ML IJ SOLN
INTRAMUSCULAR | Status: AC
  Filled 2020-06-16: qty 2

## 2020-06-16 MED ORDER — SENNA 8.6 MG PO TABS
2.0000 | ORAL_TABLET | Freq: Every day | ORAL | Status: DC
  Administered 2020-06-16 – 2020-06-18 (×3): 17.2 mg via ORAL
  Filled 2020-06-16 (×4): qty 2

## 2020-06-16 MED ORDER — FENTANYL CITRATE (PF) 100 MCG/2ML IJ SOLN: 25.0000 ug | INTRAMUSCULAR | Status: DC | PRN

## 2020-06-16 MED ORDER — CHLORHEXIDINE GLUCONATE 0.12 % MT SOLN
OROMUCOSAL | Status: AC
  Administered 2020-06-16: 15 mL via OROMUCOSAL
  Filled 2020-06-16: qty 15

## 2020-06-16 MED ORDER — LACTATED RINGERS IV SOLN: INTRAVENOUS | Status: DC

## 2020-06-16 MED ORDER — NICOTINE 14 MG/24HR TD PT24
14.0000 mg | MEDICATED_PATCH | Freq: Every day | TRANSDERMAL | Status: DC
  Administered 2020-06-16 – 2020-06-19 (×4): 14 mg via TRANSDERMAL
  Filled 2020-06-16 (×4): qty 1

## 2020-06-16 MED ORDER — OXYCODONE HCL 5 MG PO TABS
5.0000 mg | ORAL_TABLET | Freq: Once | ORAL | Status: AC
  Administered 2020-06-16: 5 mg via ORAL
  Filled 2020-06-16: qty 1

## 2020-06-16 MED ORDER — LIDOCAINE HCL URETHRAL/MUCOSAL 2 % EX GEL
CUTANEOUS | Status: AC
  Filled 2020-06-16: qty 10

## 2020-06-16 MED ORDER — DEXAMETHASONE SODIUM PHOSPHATE 10 MG/ML IJ SOLN
INTRAMUSCULAR | Status: AC
  Filled 2020-06-16: qty 1

## 2020-06-16 MED ORDER — MORPHINE SULFATE (PF) 2 MG/ML IV SOLN
2.0000 mg | INTRAVENOUS | Status: DC | PRN
  Administered 2020-06-16 – 2020-06-17 (×2): 2 mg via INTRAVENOUS
  Filled 2020-06-16 (×2): qty 1

## 2020-06-16 MED ORDER — LIDOCAINE HCL (CARDIAC) PF 100 MG/5ML IV SOSY
PREFILLED_SYRINGE | INTRAVENOUS | Status: DC | PRN
  Administered 2020-06-16: 50 mg via INTRAVENOUS

## 2020-06-16 MED ORDER — SODIUM CHLORIDE 0.9 % IV SOLN: INTRAVENOUS | Status: AC

## 2020-06-16 MED ORDER — EPHEDRINE SULFATE 50 MG/ML IJ SOLN
INTRAMUSCULAR | Status: DC | PRN
  Administered 2020-06-16: 5 mg via INTRAVENOUS
  Administered 2020-06-16: 10 mg via INTRAVENOUS

## 2020-06-16 MED ORDER — CIPROFLOXACIN IN D5W 400 MG/200ML IV SOLN
INTRAVENOUS | Status: AC
  Filled 2020-06-16: qty 200

## 2020-06-16 MED ORDER — PROPOFOL 10 MG/ML IV BOLUS
INTRAVENOUS | Status: AC
  Filled 2020-06-16: qty 20

## 2020-06-16 MED ORDER — ONDANSETRON HCL 4 MG/2ML IJ SOLN: 4.0000 mg | Freq: Four times a day (QID) | INTRAMUSCULAR | Status: DC | PRN

## 2020-06-16 MED ORDER — CIPROFLOXACIN IN D5W 400 MG/200ML IV SOLN
400.0000 mg | INTRAVENOUS | Status: AC
Start: 2020-06-16 — End: 2020-06-16
  Administered 2020-06-16: 400 mg via INTRAVENOUS

## 2020-06-16 MED ORDER — ORAL CARE MOUTH RINSE: 15.0000 mL | Freq: Once | OROMUCOSAL | Status: AC

## 2020-06-16 MED ORDER — ONDANSETRON HCL 4 MG/2ML IJ SOLN
4.0000 mg | Freq: Once | INTRAMUSCULAR | Status: DC | PRN
Start: 2020-06-16 — End: 2020-06-16

## 2020-06-16 MED ORDER — FENTANYL CITRATE (PF) 100 MCG/2ML IJ SOLN
INTRAMUSCULAR | Status: AC
  Filled 2020-06-16: qty 2

## 2020-06-16 MED ORDER — LIDOCAINE HCL (PF) 2 % IJ SOLN
INTRAMUSCULAR | Status: AC
  Filled 2020-06-16: qty 5

## 2020-06-16 MED ORDER — CHLORHEXIDINE GLUCONATE 0.12 % MT SOLN: 15.0000 mL | Freq: Once | OROMUCOSAL | Status: AC

## 2020-06-16 MED ORDER — PROPOFOL 10 MG/ML IV BOLUS
INTRAVENOUS | Status: DC | PRN
  Administered 2020-06-16: 40 mg via INTRAVENOUS
  Administered 2020-06-16: 20 mg via INTRAVENOUS

## 2020-06-16 MED ORDER — BELLADONNA ALKALOIDS-OPIUM 16.2-60 MG RE SUPP
RECTAL | Status: AC
  Filled 2020-06-16: qty 1

## 2020-06-16 MED ORDER — SUCCINYLCHOLINE CHLORIDE 200 MG/10ML IV SOSY
PREFILLED_SYRINGE | INTRAVENOUS | Status: AC
  Filled 2020-06-16: qty 10

## 2020-06-16 MED ORDER — OXYCODONE HCL 5 MG PO TABS
5.0000 mg | ORAL_TABLET | ORAL | Status: DC | PRN
  Administered 2020-06-16 – 2020-06-17 (×3): 5 mg via ORAL
  Filled 2020-06-16 (×3): qty 1

## 2020-06-16 MED ORDER — DIAZEPAM 5 MG PO TABS: 5.0000 mg | ORAL_TABLET | Freq: Once | ORAL | Status: DC

## 2020-06-16 MED ORDER — EPHEDRINE 5 MG/ML INJ
INTRAVENOUS | Status: AC
  Filled 2020-06-16: qty 10

## 2020-06-16 MED ORDER — PROPOFOL 500 MG/50ML IV EMUL
INTRAVENOUS | Status: DC | PRN
  Administered 2020-06-16: 125 ug/kg/min via INTRAVENOUS

## 2020-06-16 MED ORDER — TRAZODONE HCL 50 MG PO TABS
25.0000 mg | ORAL_TABLET | Freq: Every evening | ORAL | Status: DC | PRN
  Administered 2020-06-17: 25 mg via ORAL
  Filled 2020-06-16: qty 1

## 2020-06-16 MED ORDER — ACETAMINOPHEN 325 MG PO TABS
650.0000 mg | ORAL_TABLET | Freq: Four times a day (QID) | ORAL | Status: DC | PRN
  Administered 2020-06-18 (×3): 650 mg via ORAL
  Filled 2020-06-16 (×3): qty 2

## 2020-06-16 MED ORDER — GENTAMICIN SULFATE 40 MG/ML IJ SOLN
5.0000 mg/kg | Freq: Once | INTRAVENOUS | Status: AC
  Administered 2020-06-16: 370 mg via INTRAVENOUS
  Filled 2020-06-16: qty 9.25

## 2020-06-16 SURGICAL SUPPLY — 18 items
BAG DRN RND TRDRP ANRFLXCHMBR (UROLOGICAL SUPPLIES) ×2
BAG URINE DRAIN 2000ML AR STRL (UROLOGICAL SUPPLIES) ×3 IMPLANT
DRAPE 3/4 80X56 (DRAPES) ×3 IMPLANT
DRSG TELFA 4X3 1S NADH ST (GAUZE/BANDAGES/DRESSINGS) ×3 IMPLANT
GAUZE SPONGE 4X4 12PLY STRL (GAUZE/BANDAGES/DRESSINGS) ×3 IMPLANT
GLOVE SURG UNDER POLY LF SZ7.5 (GLOVE) ×3 IMPLANT
GUIDE NEEDLE ENDOCAV 16-18 CVR (NEEDLE) ×3 IMPLANT
INST BIOPSY MAXCORE 18GX25 (NEEDLE) ×3 IMPLANT
KIT TURNOVER CYSTO (KITS) ×3 IMPLANT
MANIFOLD NEPTUNE II (INSTRUMENTS) ×3 IMPLANT
NDL DEFLUX 3.7X23X350 (MISCELLANEOUS) ×3
NDL SAFETY ECLIPSE 18X1.5 (NEEDLE) ×2 IMPLANT
NEEDLE DEFLUX 3.7X23X350 (MISCELLANEOUS) ×2 IMPLANT
NEEDLE GUIDE BIOPSY 644068 (NEEDLE) ×3 IMPLANT
NEEDLE HYPO 18GX1.5 SHARP (NEEDLE) ×3
NEEDLE HYPO 22GX1.5 SAFETY (NEEDLE) ×3 IMPLANT
SYR 10ML LL (SYRINGE) ×3 IMPLANT
WATER STERILE IRR 1000ML POUR (IV SOLUTION) ×3 IMPLANT

## 2020-06-16 NOTE — Transfer of Care (Signed)
Immediate Anesthesia Transfer of Care Note  Patient: Cody Vaughan  Procedure(s) Performed: PROSTATE BIOPSY (N/A ) TRANSRECTAL ULTRASOUND (N/A Prostate)  Patient Location: PACU  Anesthesia Type:General  Level of Consciousness: awake  Airway & Oxygen Therapy: Patient Spontanous Breathing  Post-op Assessment: Report given to RN  Post vital signs: stable  Last Vitals:  Vitals Value Taken Time  BP 110/58 06/16/20 0808  Temp    Pulse 81 06/16/20 0810  Resp 14 06/16/20 0810  SpO2 98 % 06/16/20 0810  Vitals shown include unvalidated device data.  Last Pain:  Vitals:   06/16/20 1583  TempSrc: Temporal  PainSc: 7          Complications: No complications documented.

## 2020-06-16 NOTE — Anesthesia Preprocedure Evaluation (Signed)
Anesthesia Evaluation  Patient identified by MRN, date of birth, ID band Patient awake    Reviewed: Allergy & Precautions, NPO status , Patient's Chart, lab work & pertinent test results  Airway Mallampati: II  TM Distance: >3 FB     Dental  (+) Chipped   Pulmonary Current Smoker and Patient abstained from smoking.,    Pulmonary exam normal        Cardiovascular negative cardio ROS Normal cardiovascular exam     Neuro/Psych negative neurological ROS  negative psych ROS   GI/Hepatic negative GI ROS, Neg liver ROS,   Endo/Other  negative endocrine ROS  Renal/GU negative Renal ROS     Musculoskeletal negative musculoskeletal ROS (+)   Abdominal Normal abdominal exam  (+)   Peds negative pediatric ROS (+)  Hematology negative hematology ROS (+)   Anesthesia Other Findings Past Medical History: No date: Prostate cancer ([facility])     Comment:  2022  Reproductive/Obstetrics                             Anesthesia Physical Anesthesia Plan  ASA: II  Anesthesia Plan: General   Post-op Pain Management:    Induction: Intravenous  PONV Risk Score and Plan: Propofol infusion and TIVA  Airway Management Planned: Nasal Cannula  Additional Equipment:   Intra-op Plan:   Post-operative Plan:   Informed Consent: I have reviewed the patients History and Physical, chart, labs and discussed the procedure including the risks, benefits and alternatives for the proposed anesthesia with the patient or authorized representative who has indicated his/her understanding and acceptance.     Dental advisory given  Plan Discussed with: CRNA and Surgeon  Anesthesia Plan Comments:         Anesthesia Quick Evaluation

## 2020-06-16 NOTE — ED Triage Notes (Signed)
Pt comes into the ED via EMS from home, states he had a prostate biopsy this morning , states after getting home had blood stool with near syncope then had a second one with syncope . Pt is a/ox4 on arrival., EMS reports blood clots in the floor on there arrival.  115/85 96%RA 62HR

## 2020-06-16 NOTE — ED Notes (Signed)
Patient is alert. Wife at bedside.

## 2020-06-16 NOTE — Discharge Instructions (Signed)
AMBULATORY SURGERY  °DISCHARGE INSTRUCTIONS ° ° °1) The drugs that you were given will stay in your system until tomorrow so for the next 24 hours you should not: ° °A) Drive an automobile °B) Make any legal decisions °C) Drink any alcoholic beverage ° ° °2) You may resume regular meals tomorrow.  Today it is better to start with liquids and gradually work up to solid foods. ° °You may eat anything you prefer, but it is better to start with liquids, then soup and crackers, and gradually work up to solid foods. ° ° °3) Please notify your doctor immediately if you have any unusual bleeding, trouble breathing, redness and pain at the surgery site, drainage, fever, or pain not relieved by medication. ° ° ° °4) Additional Instructions: ° ° ° ° ° ° ° °Please contact your physician with any problems or Same Day Surgery at [phone], Monday through Friday 6 am to 4 pm, or [facility] [place] Main number at [phone]. °

## 2020-06-16 NOTE — Anesthesia Postprocedure Evaluation (Signed)
Anesthesia Post Note  Patient: Cody Vaughan  Procedure(s) Performed: PROSTATE BIOPSY (N/A ) TRANSRECTAL ULTRASOUND (N/A Prostate)  Patient location during evaluation: PACU Anesthesia Type: General Level of consciousness: awake and alert and oriented Pain management: pain level controlled Vital Signs Assessment: post-procedure vital signs reviewed and stable Respiratory status: spontaneous breathing Cardiovascular status: blood pressure returned to baseline Anesthetic complications: no   No complications documented.   Last Vitals:  Vitals:   06/16/20 0832 06/16/20 0844  BP: (!) 102/52 108/63  Pulse: 73 72  Resp: 15 16  Temp: 37.1 C 36.8 C  SpO2: 99% 100%    Last Pain:  Vitals:   06/16/20 0844  TempSrc: Temporal  PainSc: 7                  [name]

## 2020-06-16 NOTE — H&P (Signed)
History and Physical  Cody Vaughan QVZ:[id number] DOB: 02/06/1961 DOA: 06/16/2020  Referring physician: Dr. Cheri Fowler, [place] PCP: Patient, No Pcp Per (Inactive)  Outpatient Specialists: Urology. Patient coming from: Home.  Chief Complaint: Near syncope.  HPI: Cody Vaughan is a 60 y.o. 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  Associated with significant rectal bleeding and urethral bleed.  Prior to his procedure he had been experiencing generalized fatigue and diffuse bone pain for which he takes opiates.  Followed by urology.  On the day of presentation he had a bone biopsy.  After returning home his wife noted that his pants were soaked with blood.  He went to the bathroom to change and felt dizzy.  Lowered himself to the ground.  Denies hitting his head, denies losing consciousness.  Denies falling.  Due to significant blood loss and generalized weakness he decided to come to the ED for further evaluation.  While in the ED he was hypotensive with MAP in the 50s, received IV fluid with improvement of his hypotension.  Also noted acute blood loss with hemoglobin dropped from 13K to 10K.  EDP requested admission for rehydration and possible blood transfusion if needed.  Discussed with his urologist Dr. Diamantina Providence via phone, hematuria and hematochezia are expected up to 48 hours after prostate biopsy.  ED Course:  Afebrile.  BP 97/64, heart rate 67, respiratory rate 18, O2 saturation 97% on room air.  Lab studies remarkable for elevated alkaline phosphatase 862, hemoglobin 10.4 from baseline of 13.3.  Review of Systems: Review of systems as noted in the HPI. All other systems reviewed and are negative.   Past Medical History:  Diagnosis Date  . Prostate cancer ([place])    2022   Past  Surgical History:  Procedure Laterality Date  . SHOULDER SURGERY     1985    Social History:  reports that he has been smoking. He has been smoking about 1.00 pack per day. He has never used smokeless tobacco. He reports previous alcohol use. He reports that he does not use drugs.   No Known Allergies  Family History  Problem Relation Age of Onset  . COPD Mother   . Heart attack Father       Prior to Admission medications   Medication Sig Start Date End Date Taking? Authorizing Provider  acetaminophen (TYLENOL) 325 MG tablet Take 650 mg by mouth every 6 (six) hours as needed for moderate pain or mild pain.    [provider]  magnesium 30 MG tablet Take 30 mg by mouth 2 (two) times daily.    [provider]  Multiple Vitamins-Minerals (MULTIVITAMIN WITH MINERALS) tablet Take 1 tablet by mouth daily.    [provider]  oxyCODONE (OXY IR/ROXICODONE) 5 MG immediate release tablet Take 1 tablet (5 mg total) by mouth every 6 (six) hours as needed for severe pain. 06/07/20   Earlie Server, MD  potassium chloride (KLOR-CON) 10 MEQ tablet Take 10 mEq by mouth 2 (two) times daily.    [provider]    Physical Exam: BP 103/60   Pulse 65   Temp (!) 97.5 F (36.4 C) (Oral)   Resp 14   Ht 5\' 11"  (1.803 m)   Wt 75.8 kg   SpO2 97%   BMI 23.29 kg/m   .  General: 60 y.o. year-old male frail, weak appearing, in no acute distress.  Alert and oriented x3. . Cardiovascular: Regular rate and rhythm with no rubs or gallops.  No thyromegaly or JVD noted.  No lower extremity edema. 2/4 pulses in all 4 extremities. Marland Kitchen Respiratory: Clear to auscultation with no wheezes or rales. Good inspiratory effort. . Abdomen: Soft nontender nondistended with normal bowel sounds x4 quadrants. . Muskuloskeletal: No cyanosis, clubbing or edema noted bilaterally . Neuro: CN II-XII intact, strength, sensation, reflexes . Skin: No ulcerative lesions noted or rashes . Psychiatry:  Judgement and insight appear normal. Mood is appropriate for condition and setting          Labs on Admission:  Basic Metabolic Panel: Recent Labs  Lab 06/16/20 1109  NA 138  K 3.5  CL 104  CO2 24  GLUCOSE 134*  BUN 14  CREATININE 1.05  CALCIUM 8.9   Liver Function Tests: Recent Labs  Lab 06/16/20 1109  AST 33  ALT 21  ALKPHOS 862*  BILITOT 0.6  PROT 7.0  ALBUMIN 3.5   No results for input(s): LIPASE, AMYLASE in the last 168 hours. No results for input(s): AMMONIA in the last 168 hours. CBC: Recent Labs  Lab 06/16/20 1109  WBC 9.8  HGB 10.4*  HCT 31.5*  MCV 87.7  PLT 244   Cardiac Enzymes: No results for input(s): CKTOTAL, CKMB, CKMBINDEX, TROPONINI in the last 168 hours.  BNP (last 3 results) No results for input(s): BNP in the last 8760 hours.  ProBNP (last 3 results) No results for input(s): PROBNP in the last 8760 hours.  CBG: No results for input(s): GLUCAP in the last 168 hours.  Radiological Exams on Admission: [place] Transrectal Complete  Result Date: 06/16/2020 Please see Notes tab for imaging impression.  [place] PROSTATE BIOPSY MULTIPLE  Result Date: 06/16/2020 Please see Notes tab for imaging impression.   EKG: I independently viewed the EKG done and my findings are as followed: Sinus bradycardia rate of 59.  Nonspecific ST-T changes.  QTc 449.  Assessment/Plan Present on Admission: . (Resolved) Syncope  Active Problems:   Postural dizziness with presyncope  Near syncope likely secondary to orthostatic hypotension. Presented after prostate biopsy, went home first, at home he had near syncope associated with hematuria and hematochezia.   Denies passing out.  Denies losing consciousness.   Admits to dizziness, lowered himself to the floor bathroom so that he would not fall. Discussed with his urologist Dr. Diamantina Providence via phone, hematuria and hematochezia are expected up to 48 hours after prostate biopsy.  Will resolve spontaneously. Presented  to the ED for further evaluation Hypotensive in the ED responded well to IV fluid. Acute blood loss with hemoglobin dropped from 13 at baseline to 10. Dehydration with poor oral intake. Continue IV fluid hydration Obtain orthostatic vital signs every shift x1 day. Fall precautions, PT OT. Maintain hemoglobin greater than 8. H&H every 6 hours.  Acute blood loss anemia, symptomatic.   Baseline hemoglobin appears to be 13 Presented with hemoglobin of 10 post rectal bleeding and hematuria. H&H every 6 hours x1 day Transfuse hemoglobin less than 8  AKI, likely prerenal in the setting of dehydration from poor oral intake and hypovolemia from acute blood loss. Baseline creatinine appears to be 0.7 with GFR of 60. Presented with creatinine of 1.05 Avoid dehydration, hypotension and nephrotoxic agents. Monitor urine output. Repeat renal function in the morning.  Presumptive prostate cancer with bone metastasis Post biopsy on 06/16/2020 at urology's clinic.  Management per urology/oncology.  Elevated alkaline phosphatase, likely in the setting of presumptive bone metastasis Monitor  Tobacco use disorder Nicotine patch      DVT prophylaxis: SCDs due to recent rectal bleed and hematuria.  Code Status: Full code as stated by the patient himself.  Family Communication: Wife at bedside.  All questions answered to the best of my ability.  Disposition Plan: Admit to MedSurg unit with remote telemetry.  Consults called: Urology.  Admission status: Observation status.   Status is: Observation    Dispo: The patient is from: Home.              Anticipated d/c is to: Home.              Patient currently stable for discharge due to ongoing hydration.    Difficult to place patient, not applicable.       Kayleen Memos MD [facility] Pager [phone]  If 7PM-7AM, please contact night-coverage [email or web address] Password [facility]  06/16/2020, 4:12 PM

## 2020-06-16 NOTE — Interval H&P Note (Signed)
UROLOGY H&P UPDATE  Agree with prior H&P dated 06/14/2020.  60 year old male with PSA > 600 and suspected extensive metastatic bone lesions, here today for definitive diagnosis with prostate biopsy.  Cardiac: RRR Lungs: CTA bilaterally  Laterality: N/A Procedure: Transrectal ultrasound-guided prostate biopsy  Risks of prostate biopsy include bleeding, infection (including life threatening sepsis), pain, and lower urinary symptoms. Hematuria, hematospermia, and blood in the stool are all common after biopsy and can persist up to 4 weeks.   Billey Co, MD 06/16/2020

## 2020-06-16 NOTE — ED Notes (Addendum)
Patient given a ED sandwich tray and glass of water. Wife is at bedside. Patient's wife states patient hasn't eaten since 1730 last night. Patient is sitting up on the stretcher and eating without difficulty.

## 2020-06-16 NOTE — Op Note (Signed)
Date of procedure: 06/16/20  Preoperative diagnosis:  1. Elevated PSA  Postoperative diagnosis:  1. Same  Procedure: 1. Transrectal ultrasound-guided prostate biopsy  Surgeon: Nickolas Madrid, MD  Anesthesia: General  Complications: None  Intraoperative findings:  1.  Uncomplicated prostate biopsy  EBL: Minimal  Specimens: Prostate sextant biopsies x12  Drains: None  Indication: Cody Vaughan is a 60 y.o. patient with elevated PSA of almost 700 and CT scan concerning for widely metastatic bone lesions worrisome for metastatic prostate cancer.  After reviewing the management options for treatment, they elected to proceed with the above surgical procedure(s). We have discussed the potential benefits and risks of the procedure, side effects of the proposed treatment, the likelihood of the patient achieving the goals of the procedure, and any potential problems that might occur during the procedure or recuperation. Informed consent has been obtained.  Description of procedure:  The patient was taken to the operating room and sedation with propofol was induced. The patient was placed in the lateral decubitus position, and preoperative antibiotics(Cipro and gentamicin) were administered. A preoperative time-out was performed.   The transrectal ultrasound probe was inserted into the rectum.  There were no obvious abnormalities on ultrasound.  A total of 12 biopsies were taken in the standard template.  Minimal bleeding noted.  Disposition: Stable to PACU  Plan: We will call with biopsy results next week, keep follow-up for bone scan with oncology  Nickolas Madrid, MD

## 2020-06-16 NOTE — ED Provider Notes (Signed)
[facility] Emergency Department Provider Note   ____________________________________________   Event Date/Time   First MD Initiated Contact with Patient 06/16/20 1034     (approximate)  I have reviewed the triage vital signs and the nursing notes.   HISTORY  Chief Complaint Post-op Problem and GI Bleeding    HPI Cody Vaughan is a 60 y.o. male with a past medical history of prostate cancer who presents via EMS from home after a prostate biopsy this morning complaining of hematuria and blood in the stool.  Patient also states that he has had 1 episode of near syncope and another episode of syncope.  Patient denies any pain at this time and is only concerned about his blood pressure.  Patient currently denies any vision changes, tinnitus, difficulty speaking, facial droop, sore throat, chest pain, shortness of breath, abdominal pain, nausea/vomiting/diarrhea, dysuria, or weakness/numbness/paresthesias in any extremity         Past Medical History:  Diagnosis Date  . Prostate cancer [facility])    2022    Patient Active Problem List   Diagnosis Date Noted  . Postural dizziness with presyncope 06/16/2020  . Prostate cancer ([place]) 06/14/2020  . Abnormal CT scan 06/07/2020  . Elevated alkaline phosphatase level 06/07/2020  . Goals of care, counseling/discussion 06/07/2020  . Bone lesion 06/07/2020  . Epididymo-orchitis with abscess 09/16/2012  . Febrile urinary tract infection 09/16/2012  . Scrotal wall abscess 09/16/2012    Past Surgical History:  Procedure Laterality Date  . [place]    Prior to Admission medications   Medication Sig Start Date End Date Taking? Authorizing Provider  acetaminophen (TYLENOL) 325 MG tablet Take 650 mg by mouth every 6 (six) hours as needed for moderate pain or mild pain.   Yes [provider]  magnesium 30 MG tablet Take 30 mg by mouth 2 (two) times daily.   Yes [provider]   Multiple Vitamins-Minerals (MULTIVITAMIN WITH MINERALS) tablet Take 1 tablet by mouth daily.   Yes [provider]  oxyCODONE (OXY IR/ROXICODONE) 5 MG immediate release tablet Take 1 tablet (5 mg total) by mouth every 6 (six) hours as needed for severe pain. 06/07/20  Yes Earlie Server, MD  Potassium 99 MG TABS Take 99 mg by mouth daily.   Yes [provider]    Allergies Patient has no known allergies.  Family History  Problem Relation Age of Onset  . COPD Mother   . Heart attack Father     Social History Social History   Tobacco Use  . Smoking status: Current Every Day Smoker    Packs/day: 1.00  . Smokeless tobacco: Never Used  Substance Use Topics  . Alcohol use: Not Currently  . Drug use: Never    Review of Systems Constitutional: No fever/chills Eyes: No visual changes. ENT: No sore throat. Cardiovascular: Denies chest pain. Respiratory: Denies shortness of breath. Gastrointestinal: No abdominal pain.  No nausea, no vomiting.  No diarrhea.  Positive for hematochezia Genitourinary: Negative for dysuria.  Positive for hematuria Musculoskeletal: Negative for acute arthralgias Skin: Negative for rash. Neurological: Negative for headaches, weakness/numbness/paresthesias in any extremity Psychiatric: Negative for suicidal ideation/homicidal ideation   ____________________________________________   PHYSICAL EXAM:  VITAL SIGNS: ED Triage Vitals  Enc Vitals Group     BP 06/16/20 1048 (!) 84/60     Pulse Rate 06/16/20 1047 65     Resp 06/16/20 1047 17     Temp 06/16/20 1047 (!)  97.5 F (36.4 C)     Temp Source 06/16/20 1047 Oral     SpO2 06/16/20 1047 99 %     Weight 06/16/20 1047 167 lb (75.8 kg)     Height 06/16/20 1047 5\' 11"  (1.803 m)     Head Circumference --      Peak Flow --      Pain Score 06/16/20 1047 10     Pain Loc --      Pain Edu? --      Excl. in [place]? --    Constitutional: Alert and oriented. Well appearing and in no acute  distress. Eyes: Conjunctivae are normal. PERRL. Head: Atraumatic. Nose: No congestion/rhinnorhea. Mouth/Throat: Mucous membranes are moist. Neck: No stridor Cardiovascular: Grossly normal heart sounds.  Good peripheral circulation. Respiratory: Normal respiratory effort.  No retractions. Gastrointestinal: Soft and nontender. No distention. Musculoskeletal: No obvious deformities Neurologic:  Normal speech and language. No gross focal neurologic deficits are appreciated. Skin:  Skin is warm and dry. No rash noted. Psychiatric: Mood and affect are normal. Speech and behavior are normal.  ____________________________________________   LABS (all labs ordered are listed, but only abnormal results are displayed)  Labs Reviewed  COMPREHENSIVE METABOLIC PANEL - Abnormal; Notable for the following components:      Result Value   Glucose, Bld 134 (*)    Alkaline Phosphatase 862 (*)    All other components within normal limits  CBC - Abnormal; Notable for the following components:   RBC 3.59 (*)    Hemoglobin 10.4 (*)    HCT 31.5 (*)    RDW 15.6 (*)    All other components within normal limits  PROTIME-INR  HIV ANTIBODY (ROUTINE TESTING W REFLEX)  HEMOGLOBIN AND HEMATOCRIT, BLOOD  HEMOGLOBIN AND HEMATOCRIT, BLOOD  HEMOGLOBIN AND HEMATOCRIT, BLOOD  TYPE AND SCREEN   ____________________________________________  EKG  ED ECG REPORT I, Naaman Plummer, the attending physician, personally viewed and interpreted this ECG.  Date: 06/16/2020 EKG Time: 1050 Rate: 59 Rhythm: normal sinus rhythm QRS Axis: normal Intervals: normal ST/T Wave abnormalities: normal Narrative Interpretation: no evidence of acute ischemia  ____________________________________________  RADIOLOGY  ED MD interpretation:    Official radiology report(s): [place] Transrectal Complete  Result Date: 06/16/2020 Please see Notes tab for imaging impression.  [place] PROSTATE BIOPSY MULTIPLE  Result Date:  06/16/2020 Please see Notes tab for imaging impression.   ____________________________________________   PROCEDURES  Procedure(s) performed (including Critical Care):  .1-3 Lead EKG Interpretation Performed by: Naaman Plummer, MD Authorized by: Naaman Plummer, MD     Interpretation: normal     ECG rate:  64   ECG rate assessment: normal     Rhythm: sinus rhythm     Ectopy: none     Conduction: normal       ____________________________________________   INITIAL IMPRESSION / ASSESSMENT AND PLAN / ED COURSE  As part of my medical decision making, I reviewed the following data within the [place] notes reviewed and incorporated, Labs reviewed, EKG interpreted, Old chart reviewed, Radiograph reviewed and Notes from prior ED visits reviewed and incorporated        Patient presents with complaints of syncope/presyncope ED Workup:  CBC, BMP, Troponin, BNP, ECG, CXR Differential diagnosis includes HF, ICH, seizure, stroke, HOCM, ACS, aortic dissection, malignant arrhythmia, or GI bleed. Findings: No evidence of acute laboratory abnormalities.  Troponin negative x1 EKG: No e/o STEMI. No evidence of Brugadas sign, delta wave, epsilon wave, significantly  prolonged QTc, or malignant arrhythmia. Consult: Spoke to Dr. Diamantina Providence in urology who states that patient's bleeding is normal following this procedure however he did have some lower blood pressures in clinic prior to this procedure. Disposition: Admit to medicine, telemetry bed for cardiac monitoring and cardiology review.      ____________________________________________   FINAL CLINICAL IMPRESSION(S) / ED DIAGNOSES  Final diagnoses:  Post-operative pain  Syncope, unspecified syncope type     ED Discharge Orders    None       Note:  This document was prepared using Dragon voice recognition software and may include unintentional dictation errors.   Naaman Plummer, MD 06/16/20  [phone]

## 2020-06-17 ENCOUNTER — Encounter: Payer: Self-pay | Admitting: Urology

## 2020-06-17 DIAGNOSIS — W19XXXA Unspecified fall, initial encounter: Secondary | ICD-10-CM | POA: Diagnosis present

## 2020-06-17 DIAGNOSIS — R319 Hematuria, unspecified: Secondary | ICD-10-CM | POA: Diagnosis present

## 2020-06-17 LAB — COMPREHENSIVE METABOLIC PANEL
ALT: 22 U/L (ref 0–44)
AST: 95 U/L — ABNORMAL HIGH (ref 15–41)
Albumin: 2.9 g/dL — ABNORMAL LOW (ref 3.5–5.0)
Alkaline Phosphatase: 1081 U/L — ABNORMAL HIGH (ref 38–126)
Anion gap: 8 (ref 5–15)
BUN: 12 mg/dL (ref 8–23)
CO2: 22 mmol/L (ref 22–32)
Calcium: 8.6 mg/dL — ABNORMAL LOW (ref 8.9–10.3)
Chloride: 106 mmol/L (ref 98–111)
Creatinine, Ser: 0.74 mg/dL (ref 0.61–1.24)
GFR, Estimated: >60 mL/min (ref >60)
Glucose, Bld: 103 mg/dL — ABNORMAL HIGH (ref 70–99)
Potassium: 3.9 mmol/L (ref 3.5–5.1)
Sodium: 136 mmol/L (ref 135–145)
Total Bilirubin: 0.5 mg/dL (ref 0.3–1.2)
Total Protein: 5.9 g/dL — ABNORMAL LOW (ref 6.5–8.1)

## 2020-06-17 LAB — HEMOGLOBIN AND HEMATOCRIT, BLOOD
HCT: 25.7 % — ABNORMAL LOW (ref 39.0–52.0)
HCT: 26.8 % — ABNORMAL LOW (ref 39.0–52.0)
Hemoglobin: 8.8 g/dL — ABNORMAL LOW (ref 13.0–17.0)
Hemoglobin: 9.1 g/dL — ABNORMAL LOW (ref 13.0–17.0)

## 2020-06-17 LAB — CBC
HCT: 24.8 % — ABNORMAL LOW (ref 39.0–52.0)
Hemoglobin: 8.2 g/dL — ABNORMAL LOW (ref 13.0–17.0)
MCH: 28.9 pg (ref 26.0–34.0)
MCHC: 33.1 g/dL (ref 30.0–36.0)
MCV: 87.3 fL (ref 80.0–100.0)
Platelets: 183 10*3/uL (ref 150–400)
RBC: 2.84 MIL/uL — ABNORMAL LOW (ref 4.22–5.81)
RDW: 15.4 % (ref 11.5–15.5)
WBC: 7.3 10*3/uL (ref 4.0–10.5)
nRBC: 0 % (ref 0.0–0.2)

## 2020-06-17 LAB — MAGNESIUM: Magnesium: 1.5 mg/dL — ABNORMAL LOW (ref 1.7–2.4)

## 2020-06-17 LAB — PHOSPHORUS: Phosphorus: 3.6 mg/dL (ref 2.5–4.6)

## 2020-06-17 MED ORDER — TRAZODONE HCL 50 MG PO TABS
50.0000 mg | ORAL_TABLET | Freq: Every evening | ORAL | Status: DC | PRN
  Administered 2020-06-17 – 2020-06-19 (×2): 50 mg via ORAL
  Filled 2020-06-17 (×2): qty 1

## 2020-06-17 MED ORDER — MORPHINE SULFATE (PF) 2 MG/ML IV SOLN
2.0000 mg | INTRAVENOUS | Status: DC | PRN
  Administered 2020-06-17 – 2020-06-18 (×3): 2 mg via INTRAVENOUS
  Filled 2020-06-17 (×3): qty 1

## 2020-06-17 MED ORDER — OXYCODONE HCL 5 MG PO TABS
10.0000 mg | ORAL_TABLET | ORAL | Status: DC | PRN
  Administered 2020-06-17 – 2020-06-19 (×5): 10 mg via ORAL
  Filled 2020-06-17 (×5): qty 2

## 2020-06-17 MED ORDER — MAGNESIUM SULFATE 2 GM/50ML IV SOLN
2.0000 g | Freq: Once | INTRAVENOUS | Status: AC
  Administered 2020-06-17: 2 g via INTRAVENOUS
  Filled 2020-06-17: qty 50

## 2020-06-17 NOTE — Evaluation (Signed)
Occupational Therapy Evaluation Patient Details Name: Cody Vaughan MRN: [id number] DOB: 1960/05/16 Today's Date: 06/17/2020    History of Present Illness Pt is a 60 y/o M with PMH: substance abuse/EtOH/cocaine in the remote past and metastatic prostate CA, followed by Dr. Tasia Catchings Urology and Dr. Caprice Beaver neurology. Pt Underwent transrectal biopsy 4/15 and then developed hematuria, dark stool and near syncope with orthostasis without LOC. ED w/u included hypotensive BPs.   Clinical Impression   Pt seen for OT evaluation this date in setting of acute hospitalization d/t hypotension. Pt reports feeling nauseous this AM and RN aware. 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 Pt presents this date generally feeling weak and does endorse dizziness with positional change. Pt + for orthostatic hypotension and VS recorded here as well as separate note by OT. RN and MD notified. Pt requires MIN/MOD A for ADL Transfers as well as cues for safe use of RW. Pt wanting to use commode and can only tolerate SPS transfers as he currently has very limited standing tolerance. Pt engaged in toileting and LB bathing tasks requiring cues for safety d/t some impulsivity and MIN/MOD A for physical assistance. Pt requires MIN/MOD A For LB ADLs at this time as bending at the waist also causes dizzines. Pt left in chair with chair alarm and spouse present. All needs met and in reach. RN notified of session contents.    Follow Up Recommendations  Home health OT;Supervision/Assistance - 24 hour    Equipment Recommendations  3 in 1 bedside commode;Tub/shower seat;Other (comment) (2ww)    Recommendations for Other Services       Precautions / Restrictions Precautions Precautions: Fall Restrictions Weight Bearing Restrictions: No      Mobility Bed Mobility Overal bed mobility: Needs Assistance Bed Mobility: Supine to Sit     Supine to sit: Min guard;Min assist;HOB elevated      General bed mobility comments: HOB elevated, use of rails    Transfers Overall transfer level: Needs assistance Equipment used: Rolling walker (2 wheeled) Transfers: Sit to/from [facility] Sit to Stand: Min assist;Mod assist Stand pivot transfers: Min assist;Mod assist       General transfer comment: cues for walker safety, somewhat impulsive, starts to stand before instructions can be given, but likely d/t pt highly INDEP at baseline    Balance Overall balance assessment: Needs assistance   Sitting balance-Leahy Scale: Normal       Standing balance-Leahy Scale: Fair Standing balance comment: requires UE support d/t decreased tolernace and dizziness.                           ADL either performed or assessed with clinical judgement   ADL Overall ADL's : Needs assistance/impaired                                       General ADL Comments: MIN/MOD A for ADL transfers, LB ADLs including donning socks and toileting. RW used for transfers d/t decreased balance/tolerance.     Vision Patient Visual Report: No change from baseline       Perception     Praxis      Pertinent Vitals/Pain Pain Assessment: 0-10 Pain Score: 7  Pain Location: pelvic/hip/low back region Pain Descriptors / Indicators: Aching;Discomfort;Sore Pain Intervention(s): Limited activity within patient's tolerance;Monitored during session;Repositioned  Hand Dominance     Extremity/Trunk Assessment Upper Extremity Assessment Upper Extremity Assessment: Generalized weakness (ROM WFL, musculature suggests pt is acutally quite strong, but MMT grossly 4-/5 today possibly r/t decreased Hgb and BP.)   Lower Extremity Assessment Lower Extremity Assessment: Generalized weakness       Communication Communication Communication: No difficulties   Cognition Arousal/Alertness: Awake/alert Behavior During Therapy: WFL for tasks assessed/performed Overall  Cognitive Status: Within Functional Limits for tasks assessed                                 General Comments: somewhat drowsy and somewhat impulsive, but overall, WFL. A&O x4, good historian. Able to follow commands.   General Comments  114/61 (80) Left arm supine,  114/57 (73) Left arm Sitting,  71/38 (48) Left arm standing,  104/45 (65) Left arm sitting in chair post-activity    Exercises Other Exercises Other Exercises: OT facilitates ed with pt and spouse re: safety considerations, use of call button, BP monitoring for fall prevention, chair alarm, ankle and wrist pumps to help with circulation. Pt and spouse with good understanding, pt will likely require f/u on safety education.   Shoulder Instructions      Home Living Family/patient expects to be discharged to:: Private residence Living Arrangements: Spouse/significant other Available Help at Discharge: Family;Available PRN/intermittently Type of Home: House Home Access: Stairs to enter Entrance Stairs-Number of Steps: 1   Home Layout: One level               Home Equipment: None          Prior Functioning/Environment Level of Independence: Independent        Comments: Pt was working Secretary/administrator care jobs and driving. Performing all ADLs and mobility with INDEP.        OT Problem List: Decreased strength;Impaired balance (sitting and/or standing);Decreased activity tolerance;Decreased safety awareness;Decreased knowledge of use of DME or AE;Cardiopulmonary status limiting activity      OT Treatment/Interventions: Self-care/ADL training;DME and/or AE instruction;Therapeutic activities;Balance training;Therapeutic exercise;Energy conservation;Patient/family education    OT Goals(Current goals can be found in the care plan section) Acute Rehab OT Goals Patient Stated Goal: to be able to get back to work OT Goal Formulation: With patient Time For Goal Achievement: 07/01/20 Potential to  Achieve Goals: Fair ADL Goals Pt Will Perform Grooming: with supervision;with min guard assist;standing (to complete 1-2 g/h tasks sink-side, self-initiating safe and timely seated rest break as needed.) Pt Will Perform Lower Body Dressing: with supervision;sitting/lateral leans (with modified technique to decreased incidence of bending at the waist causing increased positional dizziness.) Pt Will Transfer to Toilet: with supervision;stand pivot transfer;bedside commode Pt Will Perform Toileting - Clothing Manipulation and hygiene: with supervision;sit to/from stand (self initiating safe seated rest break if needed, mid-way through task to prevent fall 2/2 orthostatic BP)  OT Frequency: Min 1X/week   Barriers to D/C:            Co-evaluation              AM-PAC OT "6 Clicks" Daily Activity     Outcome Measure Help from another person eating meals?: None Help from another person taking care of personal grooming?: A Little Help from another person toileting, which includes using toliet, bedpan, or urinal?: A Lot Help from another person bathing (including washing, rinsing, drying)?: A Lot Help from another person to put on and taking off regular upper  body clothing?: A Little Help from another person to put on and taking off regular lower body clothing?: A Lot 6 Click Score: 16   End of Session Equipment Utilized During Treatment: Gait belt;Rolling walker Nurse Communication: Mobility status;Other (comment) (orthostatic BPs)  Activity Tolerance: Patient tolerated treatment well;Treatment limited secondary to medical complications (Comment) (orthostatic BP) Patient left: in chair;with call bell/phone within reach;with chair alarm set;with family/visitor present  OT Visit Diagnosis: Unsteadiness on feet (R26.81);Muscle weakness (generalized) (M62.81)                Time: 9223-0097 OT Time Calculation (min): 45 min Charges:  OT General Charges $OT Visit: 1 Visit OT  Evaluation $OT Eval Moderate Complexity: 1 Mod OT Treatments $Self Care/Home Management : 23-37 mins $Therapeutic Activity: 8-22 mins  Gerrianne Scale, MS, OTR/L ascom [phone] 06/17/20, 2:28 PM

## 2020-06-17 NOTE — Hospital Course (Addendum)
46 white male Prior epididymitis followed conservatively Hep C Previous substance abuse/EtOH/cocaine in the remote past Metastatic prostate CA PSA 3678 followed by Dr. Tasia Catchings oncology/[facility] neurology Underwent transrectal biopsy 6/15-developed hematuria, dark stool-near syncope with orthostasis without LOC-was hypotensive in ED Hemoglobin down from 13-->10 Creatinine elevated 0.7--->.05  Data BUNs/creatinine 14/1.0-nephrograms 12/0.7 Alk phos 1081 Magnesium 1.5      Clinical Impression   Pt seated in recliner upon arrival and agreeable to therapy.  Nursing present in room throughout session for management of medications.  Pt is able to perform good transfers without any dizziness, however begin to fatigue quickly.  Pt has significant weakness bilaterally in LE's and is likely due to pain in the LE's as well.  Pt was able to perform STS x4 and able to ambulate a few feet within the room before becoming fatigued and requesting to sit EOB.  Pt able to transfer to bed in supine position with minA from therapist for leg clearance.  Pt performed bed-level exercises with some assistance required as noted below.  Pt educated on current d/c recommendation and was agreeable with reasoning behind recommendation of SNF at this time.  Pt encouraged that if pain and weakness are corrected quickly, recommendation would be changed.  Pt will benefit from skilled PT intervention to increase independence and safety with basic mobility in preparation for discharge to the venue listed below.        Follow Up Recommendations SNF

## 2020-06-17 NOTE — Progress Notes (Signed)
  PROGRESS NOTE   Cody Vaughan  NUU:[id number] DOB: [birth year] DOA: 06/16/2020 PCP: Patient, No Pcp Per (Inactive)  Brief Narrative:   49 white male Prior epididymitis followed conservatively Hep C Previous substance abuse/EtOH/cocaine in the remote past Metastatic prostate CA PSA 3678 followed by Dr. Tasia Catchings oncology/Dr. [facility] neurology Underwent transrectal biopsy 6/15-developed hematuria, dark stool-near syncope with orthostasis without LOC-was hypotensive in ED Hemoglobin down from 13-->10 Creatinine elevated 0.7--->.05    Hospital-Problem based course  Presyncope hypotension Combination of blood loss anemia Orthostatics ++ Continue fluids 125 cc/H NS Pain managementincreased Prostatic biopsy 4/15 Probable metastatic prostate cancer still being worked up by Dr. Tasia Catchings Some amount of hematuria, dark stool as expected per urologist Transfusion threshold not met Pain control morphine 2 mg every 4 as needed, oxycodone 5-->10 mg as needed for moderate pain (patient on OxyIR 5 mg at home) AKI on admission Resolving with fluids Replacing magnesium aggressively  DVT prophylaxis: SCD Code Status: Full Family Communication: d/w wife at bedsdie Disposition:  Status is: Observation  The patient will require care spanning > 2 midnights and should be moved to inpatient because: Hemodynamically unstable, Persistent severe electrolyte disturbances, Altered mental status and Ongoing diagnostic testing needed not appropriate for outpatient work up  Dispo:  Patient From: Home  Planned Disposition: Home  Medically stable for discharge: No      Consultants:   None at this time  Procedures: Prostatic biopsy  Antimicrobials: No   Subjective: 10/10 boney pain hips, legs No cp no fever [place] out of a tub--not straight to the ground  Objective: Vitals:   06/16/20 2103 06/16/20 2315 06/17/20 0545 06/17/20 0844  BP: (!) 102/56 (!) 102/50 (!) 105/57 98/62  Pulse: 71 71 86 96  Resp:  _0 Temp: 98.7 F (37.1 C) 98.5 F (36.9 C) 98.7 F (37.1 C) 98.3 F (36.8 C)  TempSrc:  Oral Oral   SpO2: 99% 98% 98% 97%  Weight:      Height:        Intake/Output Summary (Last 24 hours) at 06/17/2020 0940 Last data filed at 06/17/2020 3474 Gross per 24 hour  Intake 2471.25 ml  Output 1200 ml  Net 1271.25 ml   Filed Weights   06/16/20 1047  Weight: 75.8 kg    Examination:  eomi ncat young looking male well built cta b s1 s 2no m abd soft Restricted ROM to Hips and knees 2/2 pain Neuro intact  Data Reviewed: personally reviewed   Data BUNs/creatinine 14/1.0--> 12/0.7 Alk phos 1081 Magnesium 1.5  Radiology Studies: [place] Transrectal Complete  Result Date: 06/16/2020 Please see Notes tab for imaging impression.  [place] PROSTATE BIOPSY MULTIPLE  Result Date: 06/16/2020 Please see Notes tab for imaging impression.    Scheduled Meds: . nicotine  14 mg Transdermal Daily  . senna  2 tablet Oral Daily   Continuous Infusions: . sodium chloride 75 mL/hr at 06/17/20 0554  . magnesium sulfate bolus IVPB       LOS: 0 days   Time spent: 27  Nita Sells, MD [facility] To contact the attending provider between 7A-7P or the covering provider during after hours 7P-7A, please log into the web site [email or web address] and access using universal [place] password for that web site. If you do not have the password, please call the hospital operator.  06/17/2020, 9:40 AM

## 2020-06-17 NOTE — Evaluation (Signed)
Physical Therapy Evaluation Patient Details Name: Cody Vaughan MRN: [id number] DOB: 04/26/1960 Today's Date: 06/17/2020   History of Present Illness  Pt is a 60 y/o M with PMH: substance abuse/EtOH/cocaine in the remote past and metastatic prostate CA, followed by Dr. Tasia Catchings Urology and Dr. Caprice Beaver neurology. Pt Underwent transrectal biopsy 4/15 and then developed hematuria, dark stool and near syncope with orthostasis without LOC. ED w/u included hypotensive BPs.     Clinical Impression  Pt seated in recliner upon arrival and agreeable to therapy.  Nursing present in room throughout session for management of medications.  Pt is able to perform good transfers without any dizziness, however begin to fatigue quickly.  Pt has significant weakness bilaterally in LE's and is likely due to pain in the LE's as well.  Pt was able to perform STS x4 and able to ambulate a few feet within the room before becoming fatigued and requesting to sit EOB.  Pt able to transfer to bed in supine position with minA from therapist for leg clearance.  Pt performed bed-level exercises with some assistance required as noted below.  Pt educated on current d/c recommendation and was agreeable with reasoning behind recommendation of SNF at this time.  Pt encouraged that if pain and weakness are corrected quickly, recommendation would be changed.  Pt will benefit from skilled PT intervention to increase independence and safety with basic mobility in preparation for discharge to the venue listed below.       Follow Up Recommendations SNF    Equipment Recommendations  Rolling walker with 5" wheels;3in1 (PT)    Recommendations for Other Services       Precautions / Restrictions Precautions Precautions: Fall Restrictions Weight Bearing Restrictions: No      Mobility  Bed Mobility Overal bed mobility: Needs Assistance Bed Mobility: Sit to Supine     Supine to sit: Min guard;Min assist;HOB elevated Sit to supine: Min  guard;Min assist   General bed mobility comments: use of rails and require minA for transfer of legs into bed.    Transfers Overall transfer level: Needs assistance Equipment used: Rolling walker (2 wheeled) Transfers: Sit to/from Stand Sit to Stand: Min assist;Mod assist Stand pivot transfers: Min assist;Mod assist       General transfer comment: pt able to utilize UE's for pushing throgh and support with FWW before coming upright.  Ambulation/Gait Ambulation/Gait assistance: Min guard Gait Distance (Feet): 8 Feet Assistive device: Rolling walker (2 wheeled) Gait Pattern/deviations: WFL(Within Functional Limits);Step-to pattern;Decreased stride length Gait velocity: decreased   General Gait Details: Pt only able to walk short distance within room before starting to become fatigued and pain in legs becoming too much.  Stairs            Wheelchair Mobility    Modified Rankin (Stroke Patients Only)       Balance Overall balance assessment: Needs assistance   Sitting balance-Leahy Scale: Normal       Standing balance-Leahy Scale: Fair Standing balance comment: requires UE support due to weakness/pain of LEs.                             Pertinent Vitals/Pain Pain Assessment: 0-10 Pain Score: 7  Pain Location: pelvic/hip/low back region Pain Descriptors / Indicators: Aching;Discomfort;Sore Pain Intervention(s): Limited activity within patient's tolerance;Monitored during session;Repositioned    Home Living Family/patient expects to be discharged to:: Private residence Living Arrangements: Spouse/significant other Available Help at Discharge: Family;Available  PRN/intermittently Type of Home: House Home Access: Stairs to enter   [employer] of Steps: 1 Home Layout: One level Home Equipment: None      Prior Function Level of Independence: Independent         Comments: Pt was working Secretary/administrator care jobs and driving.  Performing all ADLs and mobility with INDEP.     Hand Dominance        Extremity/Trunk Assessment   Upper Extremity Assessment Upper Extremity Assessment: Defer to OT evaluation    Lower Extremity Assessment Lower Extremity Assessment: Generalized weakness       Communication   Communication: No difficulties  Cognition Arousal/Alertness: Awake/alert Behavior During Therapy: WFL for tasks assessed/performed Overall Cognitive Status: Within Functional Limits for tasks assessed                                 General Comments: A&O x4, pt in pain however was pleasant throughout session.  Able to voice when he needs to sit down.      General Comments General comments (skin integrity, edema, etc.): 114/61 (80) Left arm supine,  114/57 (73) Left arm Sitting,  71/38 (48) Left arm standing,  104/45 (65) Left arm sitting in chair post-activity    Exercises Total Joint Exercises Ankle Circles/Pumps: AROM;Strengthening;Both;10 reps;Supine Quad Sets: AROM;Strengthening;Both;10 reps;Supine Gluteal Sets: AROM;Strengthening;Both;10 reps;Supine Hip ABduction/ADduction: AROM;Right;AAROM;Left;Strengthening;10 reps;Supine Straight Leg Raises: AROM;Right;AAROM;Left;Strengthening;10 reps;Supine Marching in Standing: AROM;Strengthening;Both;10 reps;Standing Other Exercises Other Exercises: OT facilitates ed with pt and spouse re: safety considerations, use of call button, BP monitoring for fall prevention, chair alarm, ankle and wrist pumps to help with circulation. Pt and spouse with good understanding, pt will likely require f/u on safety education. Other Exercises: Pt educated on roles of PT and services offered to patient during hospital stay.  Pt also encouraged to perform bed-level exercises for physiological benefits between bouts of therapy.   Assessment/Plan    PT Assessment Patient needs continued PT services  PT Problem List Decreased strength;Decreased activity  tolerance;Decreased balance;Decreased mobility;Decreased knowledge of use of DME;Decreased safety awareness;Pain       PT Treatment Interventions DME instruction;Gait training;Stair training;Functional mobility training;Therapeutic activities;Therapeutic exercise;Balance training;Neuromuscular re-education;Patient/family education    PT Goals (Current goals can be found in the Care Plan section)  Acute Rehab PT Goals Patient Stated Goal: to be able to get back to work PT Goal Formulation: With patient Time For Goal Achievement: 07/01/20 Potential to Achieve Goals: Fair    Frequency Min 2X/week   Barriers to discharge        Co-evaluation               AM-PAC PT "6 Clicks" Mobility  Outcome Measure Help needed turning from your back to your side while in a flat bed without using bedrails?: A Little Help needed moving from lying on your back to sitting on the side of a flat bed without using bedrails?: A Little Help needed moving to and from a bed to a chair (including a wheelchair)?: A Lot Help needed standing up from a chair using your arms (e.g., wheelchair or bedside chair)?: A Little Help needed to walk in hospital room?: A Little Help needed climbing 3-5 steps with a railing? : A Lot 6 Click Score: 16    End of Session Equipment Utilized During Treatment: Gait belt Activity Tolerance: Patient limited by fatigue Patient left: in bed;with call bell/phone within reach;with bed alarm set;with  nursing/sitter in room Nurse Communication: Mobility status PT Visit Diagnosis: Unsteadiness on feet (R26.81);Other abnormalities of gait and mobility (R26.89);Muscle weakness (generalized) (M62.81);Difficulty in walking, not elsewhere classified (R26.2);Dizziness and giddiness (R42);Pain Pain - Right/Left:  (Bilateral) Pain - part of body: Leg    Time: 0698-6148 PT Time Calculation (min) (ACUTE ONLY): 43 min   Charges:   PT Evaluation $PT Eval Low Complexity: 1 Low PT  Treatments $Gait Training: 8-22 mins $Therapeutic Exercise: 23-37 mins        Gwenlyn Saran, PT, DPT 06/17/20, 3:41 PM   Christie Nottingham 06/17/2020, 3:37 PM

## 2020-06-17 NOTE — Progress Notes (Signed)
Orthostatic VS positional changes: BP (MAP) BP Location  114/61 (80) Left arm supine  114/57 (73) Left arm Sitting  71/38 (48) Left arm standing  104/45 (65) Left arm sitting in chair post-activity     BP checked during occupational therapy session. RN, Velna Hatchet, notified.    Gerrianne Scale, [place], OTR/L ascom [phone] 06/17/20, 9:48 AM

## 2020-06-17 NOTE — Progress Notes (Deleted)
SATURATION QUALIFICATIONS: (This note is used to comply with regulatory documentation for home oxygen)  Patient Saturations on Room Air at Rest = 96 %  Patient Saturations on Room Air while Ambulating = 57%  Patient Saturations on  Liters of oxygen while Ambulating =   Please briefly explain why patient needs home oxygen:  Pt SpO2 dropped to 57% while ambulating on Room air. Pt exhibited no c/o and stated he could walk back to room. This RN explained to pt that she could not permit pt to walk to room with SpO2 saturations of 57%. Wheel chair brought to pt. Pt assisted to sit in wheelchair. With in 1 min of sitting pt SpO2 returned to 76%. Pt placed on 2L of O2 [place] SpO2 returned to 98% with in one more adfditional min of sitting on 2L O2.  MD, T. Lai notified in person verbally. Will ween pt to RA again as directed by MD, T. Billie Ruddy.

## 2020-06-18 ENCOUNTER — Inpatient Hospital Stay: Payer: Self-pay

## 2020-06-18 DIAGNOSIS — I313 Pericardial effusion (noninflammatory): Secondary | ICD-10-CM | POA: Diagnosis present

## 2020-06-18 DIAGNOSIS — I3139 Other pericardial effusion (noninflammatory): Secondary | ICD-10-CM | POA: Diagnosis present

## 2020-06-18 LAB — COMPREHENSIVE METABOLIC PANEL
ALT: 19 U/L (ref 0–44)
AST: 152 U/L — ABNORMAL HIGH (ref 15–41)
Albumin: 3 g/dL — ABNORMAL LOW (ref 3.5–5.0)
Alkaline Phosphatase: 2435 U/L — ABNORMAL HIGH (ref 38–126)
Anion gap: 10 (ref 5–15)
BUN: 13 mg/dL (ref 8–23)
CO2: 23 mmol/L (ref 22–32)
Calcium: 7.9 mg/dL — ABNORMAL LOW (ref 8.9–10.3)
Chloride: 99 mmol/L (ref 98–111)
Creatinine, Ser: 0.77 mg/dL (ref 0.61–1.24)
GFR, Estimated: >60 mL/min (ref >60)
Glucose, Bld: 114 mg/dL — ABNORMAL HIGH (ref 70–99)
Potassium: 3.8 mmol/L (ref 3.5–5.1)
Sodium: 132 mmol/L — ABNORMAL LOW (ref 135–145)
Total Bilirubin: 0.9 mg/dL (ref 0.3–1.2)
Total Protein: 6.1 g/dL — ABNORMAL LOW (ref 6.5–8.1)

## 2020-06-18 LAB — CBC WITH DIFFERENTIAL/PLATELET
Abs Immature Granulocytes: 0.24 10*3/uL — ABNORMAL HIGH (ref 0.00–0.07)
Basophils Absolute: 0.1 10*3/uL (ref 0.0–0.1)
Basophils Relative: 1 %
Eosinophils Absolute: 0.1 10*3/uL (ref 0.0–0.5)
Eosinophils Relative: 1 %
HCT: 25.3 % — ABNORMAL LOW (ref 39.0–52.0)
Hemoglobin: 8.7 g/dL — ABNORMAL LOW (ref 13.0–17.0)
Immature Granulocytes: 3 %
Lymphocytes Relative: 23 %
Lymphs Abs: 1.7 10*3/uL (ref 0.7–4.0)
MCH: 29.6 pg (ref 26.0–34.0)
MCHC: 34.4 g/dL (ref 30.0–36.0)
MCV: 86.1 fL (ref 80.0–100.0)
Monocytes Absolute: 0.9 10*3/uL (ref 0.1–1.0)
Monocytes Relative: 11 %
Neutro Abs: 4.7 10*3/uL (ref 1.7–7.7)
Neutrophils Relative %: 61 %
Platelets: 143 10*3/uL — ABNORMAL LOW (ref 150–400)
RBC: 2.94 MIL/uL — ABNORMAL LOW (ref 4.22–5.81)
RDW: 15.3 % (ref 11.5–15.5)
WBC: 7.7 10*3/uL (ref 4.0–10.5)
nRBC: 0 % (ref 0.0–0.2)

## 2020-06-18 LAB — LACTIC ACID, PLASMA
Lactic Acid, Venous: 0.9 mmol/L (ref 0.5–1.9)
Lactic Acid, Venous: 0.9 mmol/L (ref 0.5–1.9)

## 2020-06-18 LAB — URINALYSIS, COMPLETE (UACMP) WITH MICROSCOPIC
Bilirubin Urine: NEGATIVE
Glucose, UA: NEGATIVE mg/dL
Ketones, ur: NEGATIVE mg/dL
Leukocytes,Ua: NEGATIVE
Nitrite: NEGATIVE
Protein, ur: NEGATIVE mg/dL
RBC / HPF: >50 RBC/hpf — ABNORMAL HIGH (ref 0–5)
Specific Gravity, Urine: 1.011 (ref 1.005–1.030)
Squamous Epithelial / HPF: NONE SEEN (ref 0–5)
pH: 5 (ref 5.0–8.0)

## 2020-06-18 LAB — PSA: Prostatic Specific Antigen: 386 ng/mL — ABNORMAL HIGH (ref 0.00–4.00)

## 2020-06-18 IMAGING — DX DG PELVIS 1-2V
1 series · 1 of 1 positions shown · non-contrast
Comparison: None.

CLINICAL DATA: Fall on [REDACTED]. History of substance abuse. History
of metastatic prostate cancer.

EXAM:
PELVIS - 1-2 VIEW

[pelvis ap]
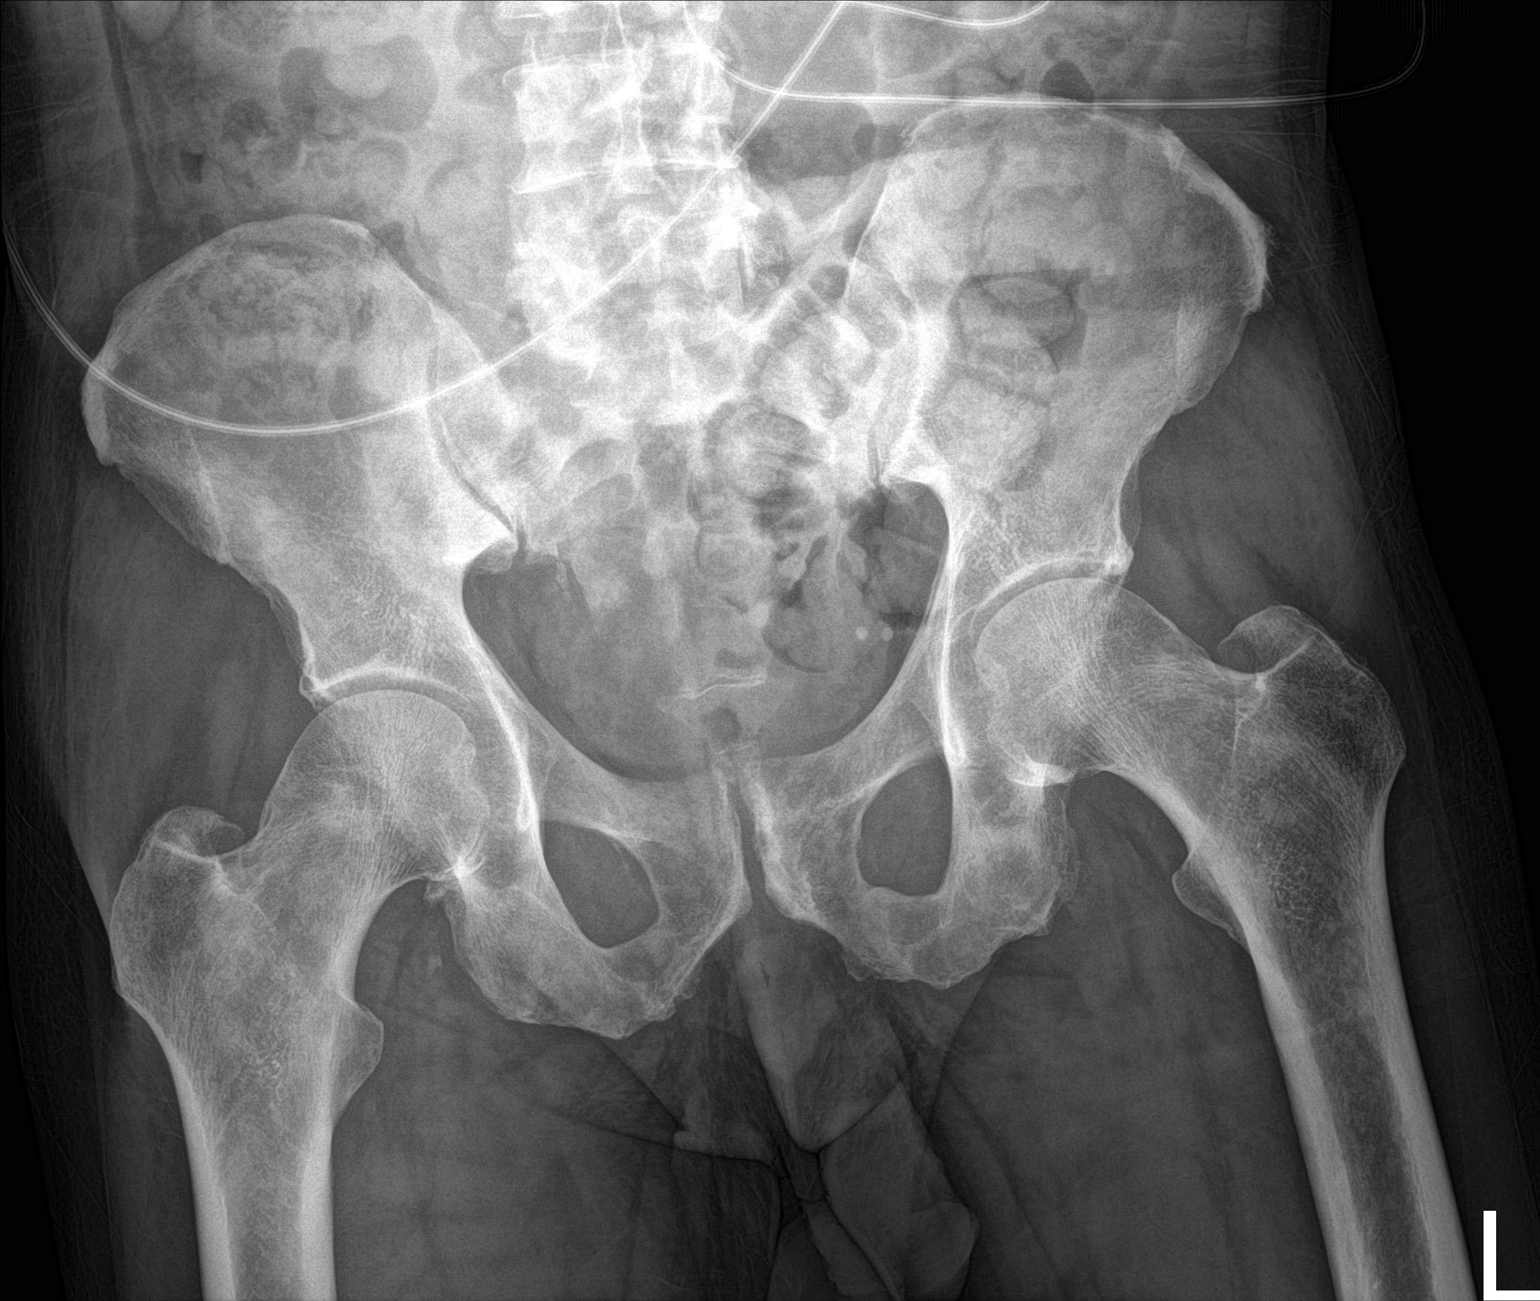

[1 of 1 positions shown; findings below may reference images not displayed]

FINDINGS: No evidence of acute osseous fracture or dislocation. Heterogeneous
mineralization throughout the osseous pelvis, compatible with
underlying osseous metastases, better demonstrated on earlier CT
abdomen of [DATE]. Soft tissues about the pelvis are
unremarkable.
IMPRESSION: No acute findings. No evidence of acute osseous fracture or
dislocation. Osseous metastases, better demonstrated on earlier CT
abdomen of [DATE].

## 2020-06-18 IMAGING — MR MR SACRUM / SI JOINTS WO/W CM
8 of 9 series · 42 of 48 positions shown · IV contrast (gadavist)
Comparison: CT pelvis [DATE]

CLINICAL DATA: Metastatic prostate cancer

EXAM:
MRI PELVIS (SACRUM) WITHOUT AND WITH CONTRAST
TECHNIQUE: Multiplanar multisequence MR imaging of the pelvis was performed
both before and after administration of intravenous contrast. Sacral
protocol utilized.
CONTRAST:  7.5mL GADAVIST GADOBUTROL 1 MMOL/ML IV SOLN

[Series 2: T2 fat-sat · sagittal · 4.0mm · 0.75mm/px · 5 of 25 slices shown (1 of 2)]
[im 1/25]
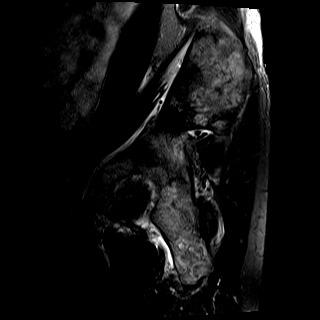
[im 7/25]
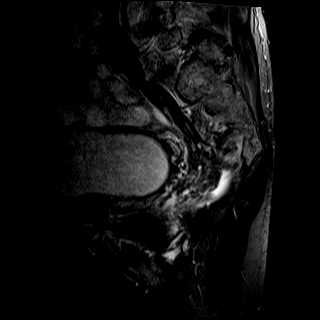
[im 13/25]
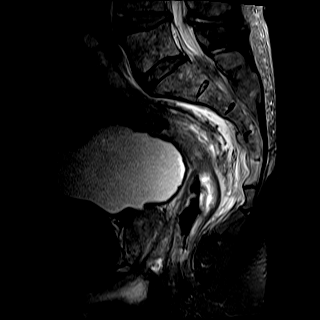
[im 19/25]
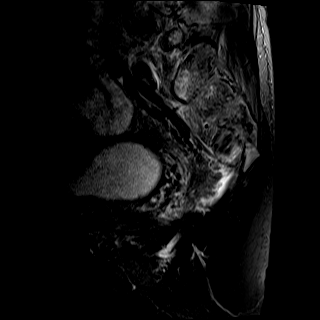
[im 25/25]
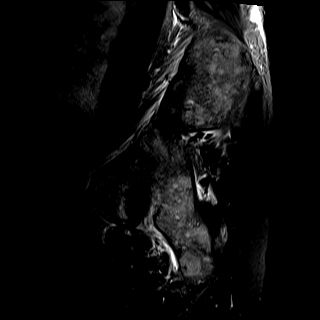

[Series 3: T1 · axial · 4.0mm · 0.86mm/px · z∈[-616,-456]mm · 7 of 34 slices shown (1 of 4)]
[im 1/34]
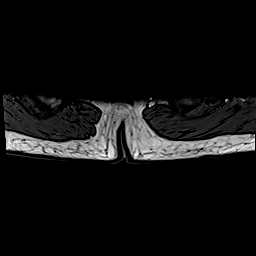
[im 6/34]
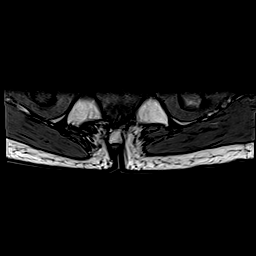
[im 12/34]
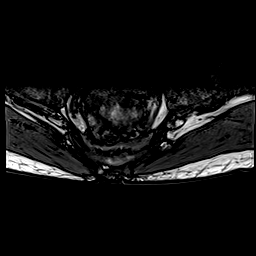
[im 17/34]
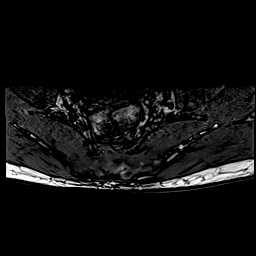
[im 23/34]
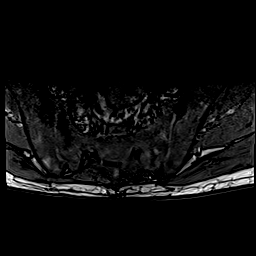
[im 28/34]
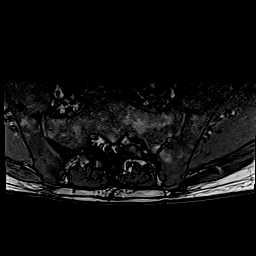
[im 34/34]
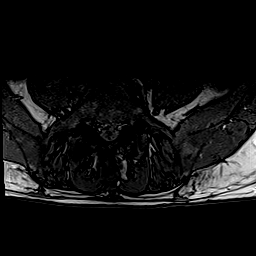

[Series 4: T1 · axial · 4.0mm · 0.86mm/px · z∈[-616,-456]mm · 6 of 34 slices shown (2 of 4)]
[im 1/34]
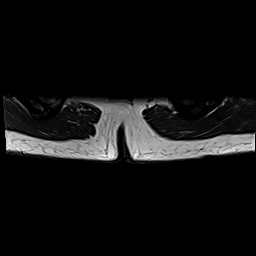
[im 7/34]
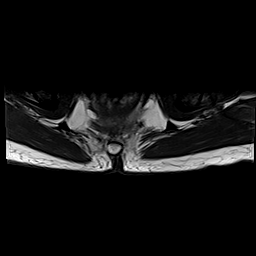
[im 14/34]
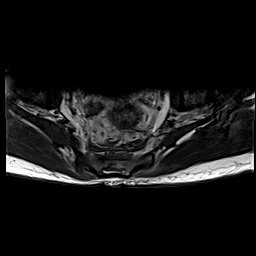
[im 20/34]
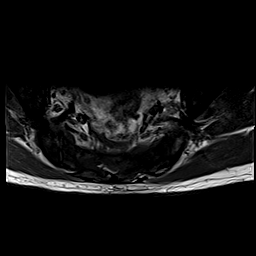
[im 27/34]
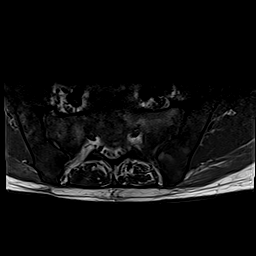
[im 34/34]
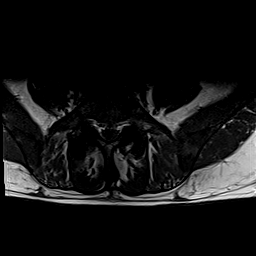

[Series 6: T1 · axial · 4.0mm · 0.86mm/px · z∈[-616,-456]mm · 6 of 34 slices shown (3 of 4)]
[im 1/34]
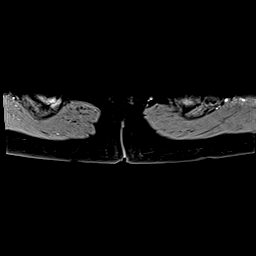
[im 7/34]
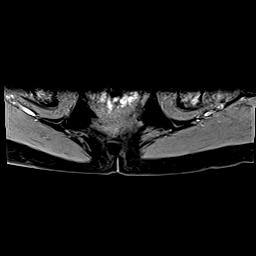
[im 14/34]
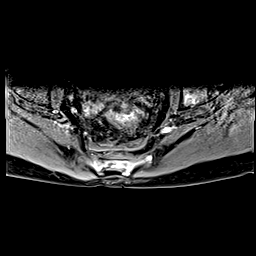
[im 20/34]
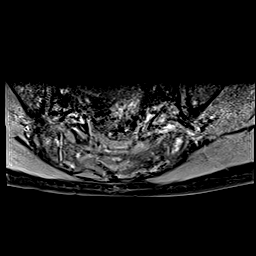
[im 27/34]
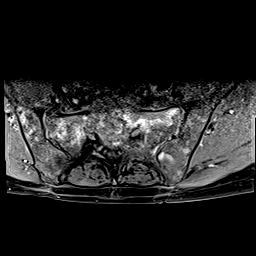
[im 34/34]
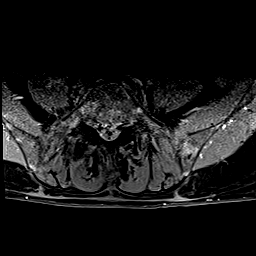

[Series 7: T2 fat-sat · axial · 4.0mm · 0.86mm/px · z∈[-616,-456]mm · 6 of 34 slices shown (2 of 2)]
[im 1/34]
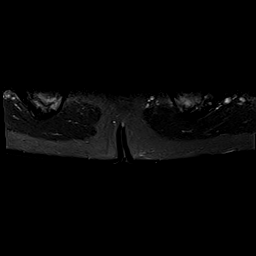
[im 7/34]
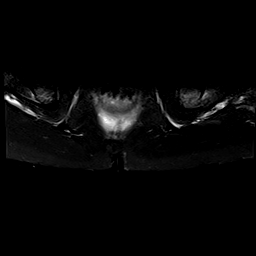
[im 14/34]
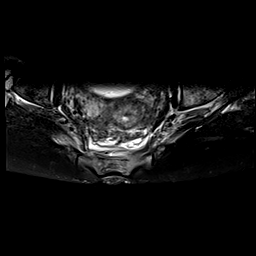
[im 20/34]
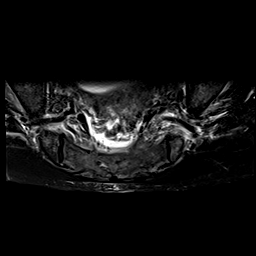
[im 27/34]
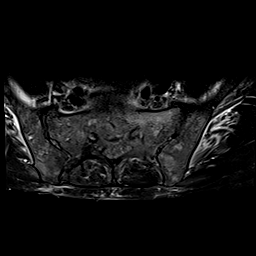
[im 34/34]
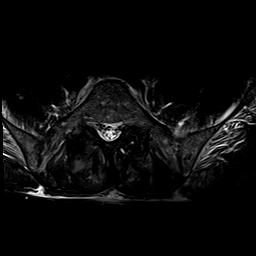

[Series 8: STIR · coronal · 3.0mm · 0.47mm/px · 4 of 25 slices shown]
[im 1/25]
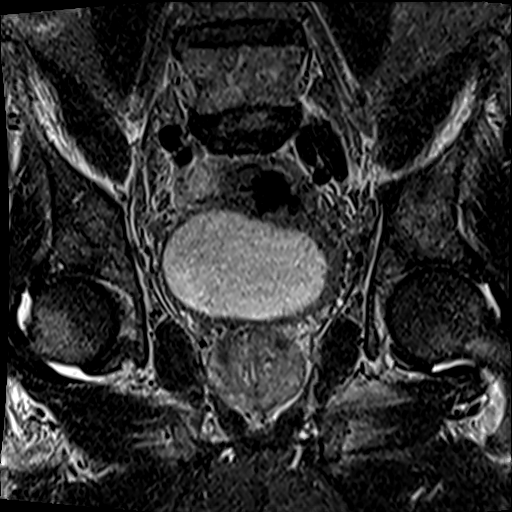
[im 9/25]
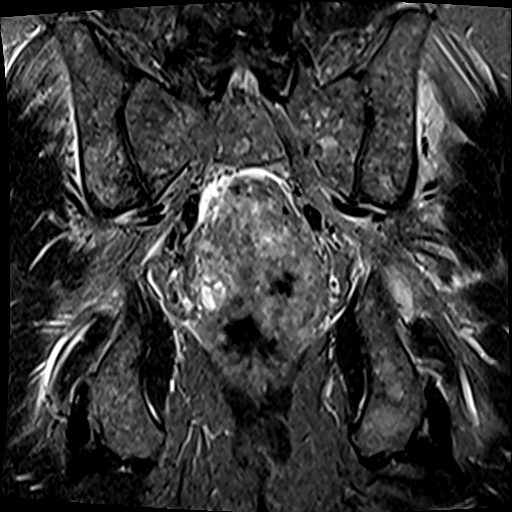
[im 17/25]
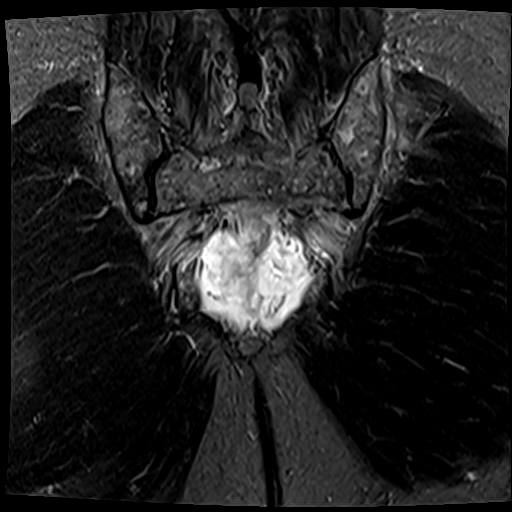
[im 25/25]
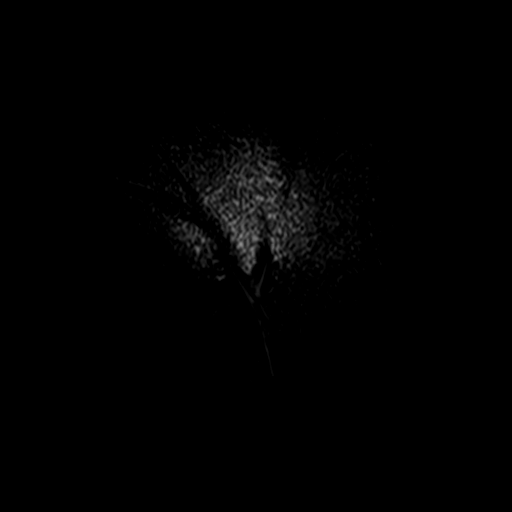

[Series 9: T1 · coronal · 3.0mm · 0.47mm/px · 4 of 25 slices shown (4 of 4)]
[im 1/25]
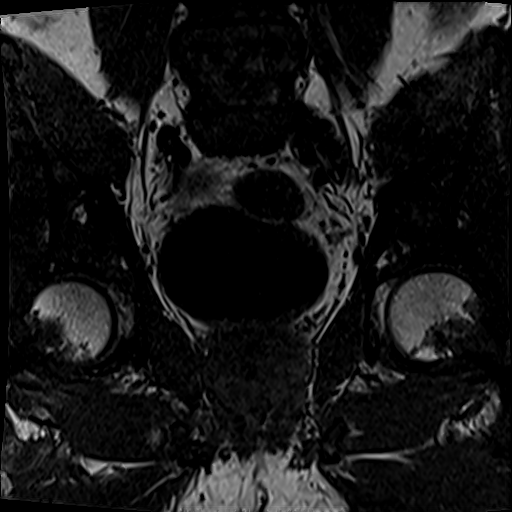
[im 9/25]
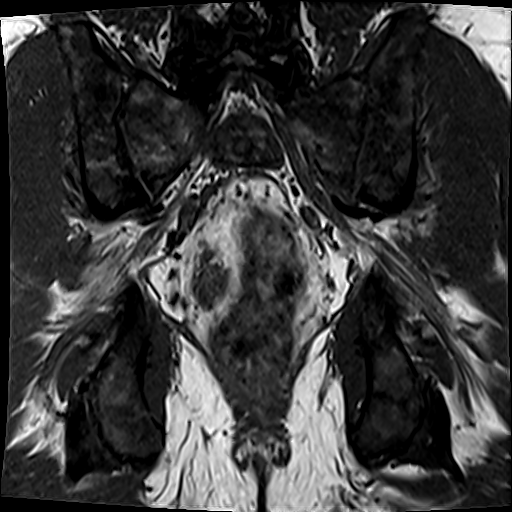
[im 17/25]
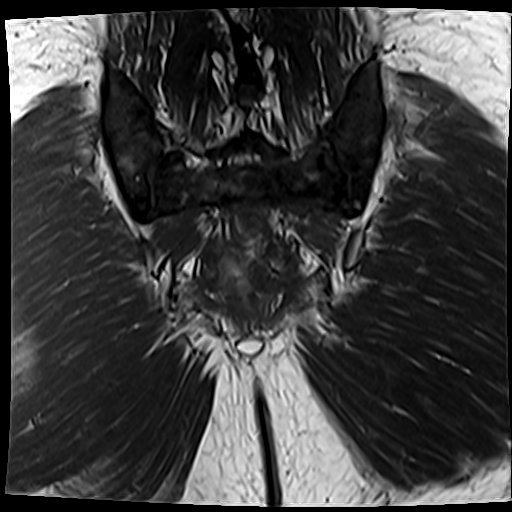
[im 25/25]
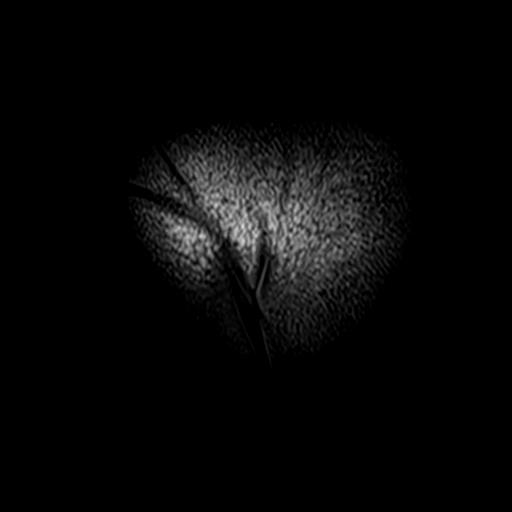

[Series 13: T1 fat-sat · axial · 4.0mm · 0.86mm/px · z∈[-616,-524]mm · 4 of 34 slices shown]
[im 1/34]
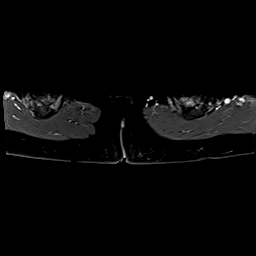
[im 7/34]
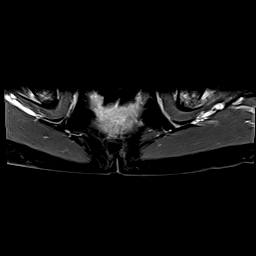
[im 14/34]
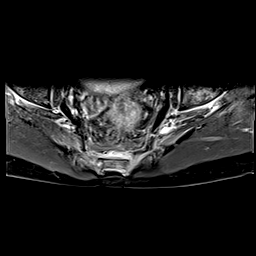
[im 20/34]
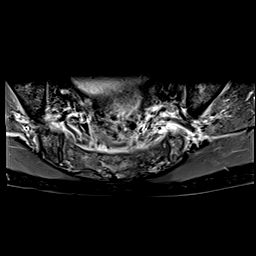

[42 of 48 positions shown; findings below may reference images not displayed]

FINDINGS: Osseous structures: Findings of diffuse metastatic disease
throughout the visualized sacrum and in the adjacent iliac bones and
ischia. There is some relative sparing of the coccyx. This manifests
is diffusely low T2 signal in the involved regions, as well as
patchy enhancement. This matches the diffuse sclerotic appearance
with sparing of the coccyx seen on CT of [DATE]. In the sacrum,
I do not observe compelling findings of superimposed fracture or
substantial extraosseous extension. There is some anterior spurring
along the SI joints. Degenerative facet arthropathy at L5-S1 noted.

Sacroiliac joints: No erosion or effusion.

Presacral soft tissues: There is moderate presacral edema without a
discrete mass.

Sacral plexus: No discrete impinging lesion is identified along the
sacral plexus or sciatic notch.

Surrounding tissues: Mild edema signal is present in the iliacus and
gluteus medius and minimus musculature in a symmetric manner. There
is also some low-level edema in the medial piriformis musculature
bilaterally. Visualized portion of the rectum unremarkable.
IMPRESSION: 1. Diffuse malignant involvement of the sacrum as well as the
adjacent portions of the iliac bones and ischia. Relative sparing of
the coccyx. No appreciable fracture or impingement along the sacral
plexus or sciatic notch region, although there is some regional
edema tracking along adjacent musculature and in the presacral space
as detailed above.

## 2020-06-18 IMAGING — MR MR THORACIC SPINE WO/W CM
7 of 9 series · 32 of 48 positions shown · IV contrast (gadavist)
Comparison: CT chest [DATE]

CLINICAL DATA: Metastatic disease evaluation. Metastatic prostate
cancer with back pain.

EXAM:
MRI THORACIC WITHOUT AND WITH CONTRAST
TECHNIQUE: Multiplanar and multiecho pulse sequences of the thoracic spine were
obtained without and with intravenous contrast.
CONTRAST:  7.5mL GADAVIST GADOBUTROL 1 MMOL/ML IV SOLN

[Series 18: T1 · sagittal · 6.0mm · 1.88mm/px · 1 of 9 slices shown (1 of 2)]
[im 1/9]
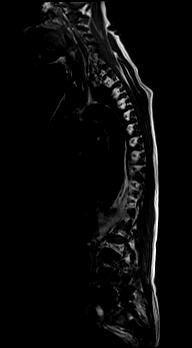

[Series 19: T2 · sagittal · 3.0mm · 1.33mm/px · 3 of 19 slices shown (1 of 2)]
[im 1/19]
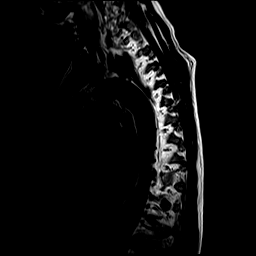
[im 10/19]
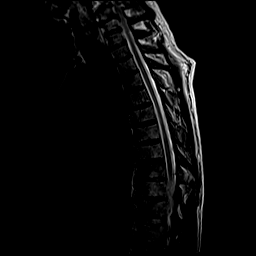
[im 19/19]
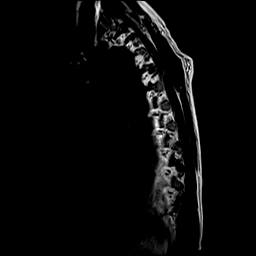

[Series 20: T1 · sagittal · 3.0mm · 1.33mm/px · 4 of 19 slices shown (2 of 2)]
[im 1/19]
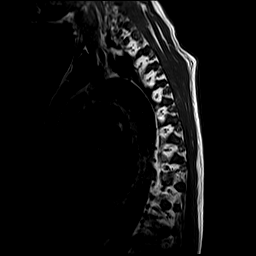
[im 7/19]
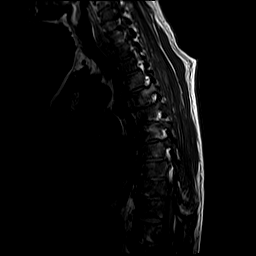
[im 13/19]
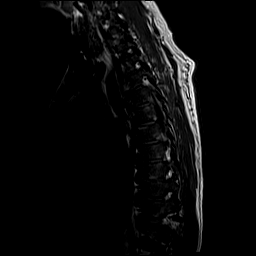
[im 19/19]
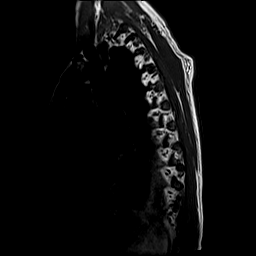

[Series 21: STIR · sagittal · 3.0mm · 0.66mm/px · 4 of 19 slices shown]
[im 1/19]
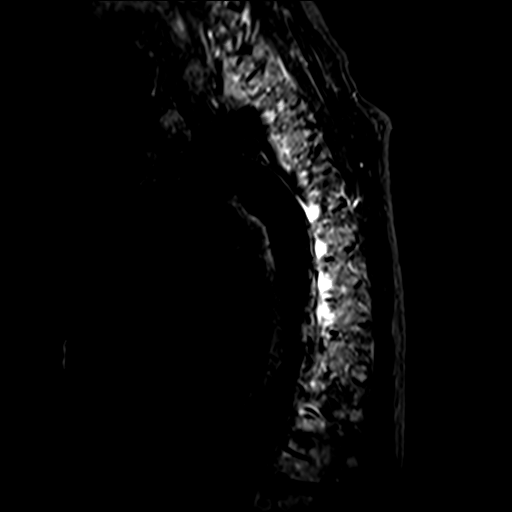
[im 7/19]
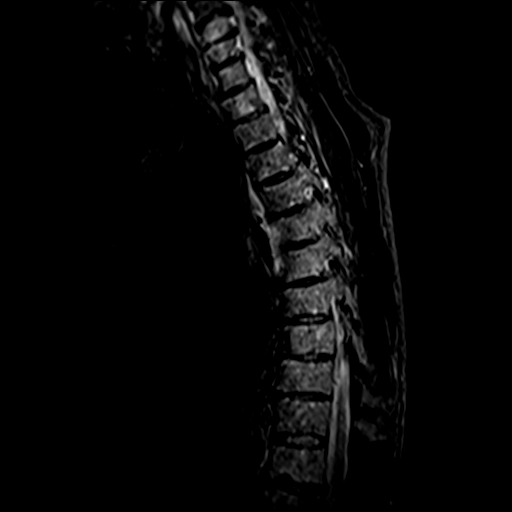
[im 13/19]
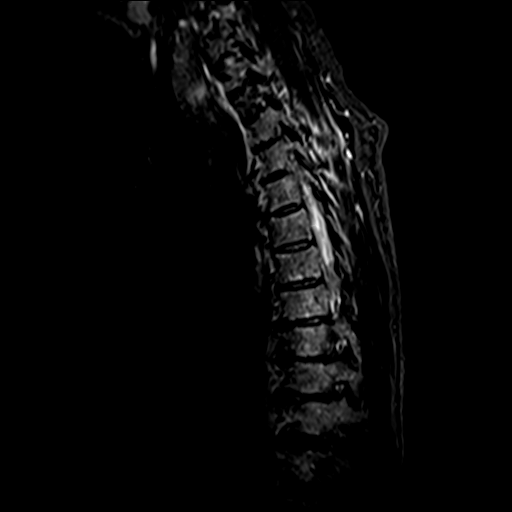
[im 19/19]
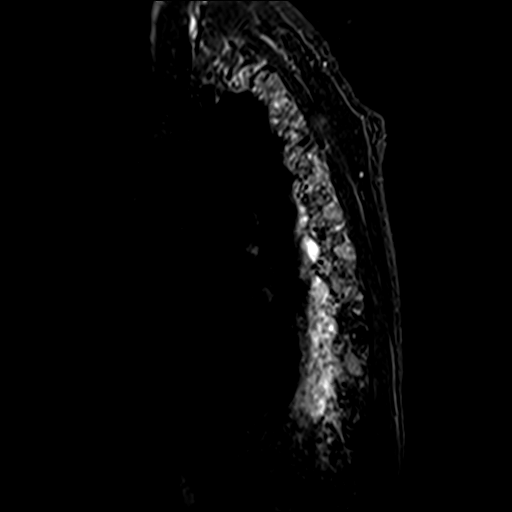

[Series 22: T2 · axial · 4.0mm · 0.59mm/px · z∈[-273,-6]mm · 8 of 39 slices shown (2 of 2)]
[im 1/39]
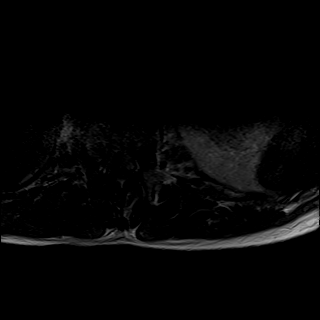
[im 6/39]
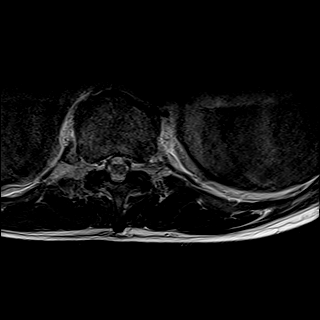
[im 11/39]
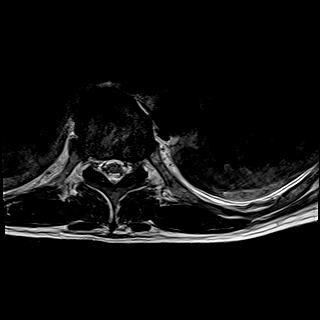
[im 17/39]
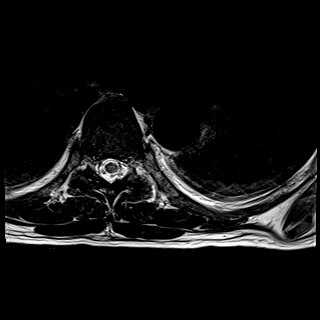
[im 22/39]
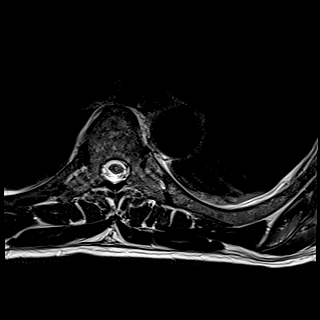
[im 28/39]
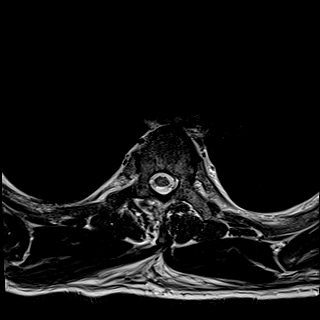
[im 33/39]
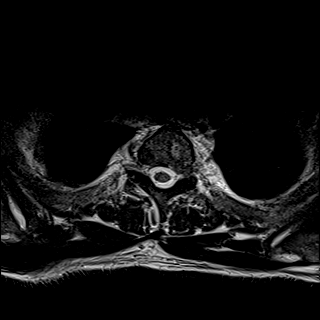
[im 39/39]
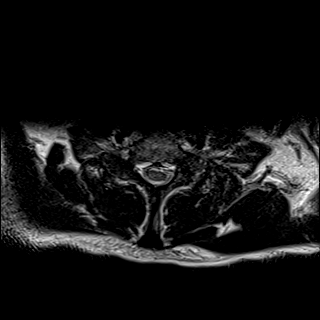

[Series 24: T1 post-contrast · axial · non-contrast · 4.0mm · 0.37mm/px · z∈[-273,-6]mm · 8 of 39 slices shown]
[im 1/39]
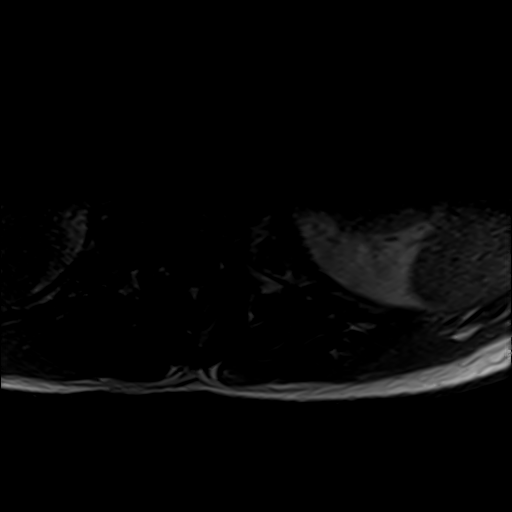
[im 6/39]
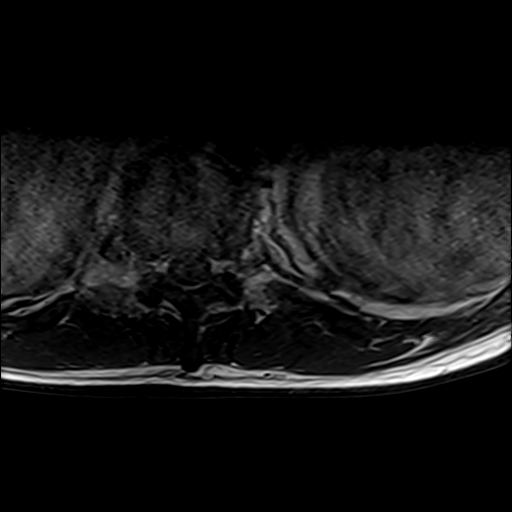
[im 11/39]
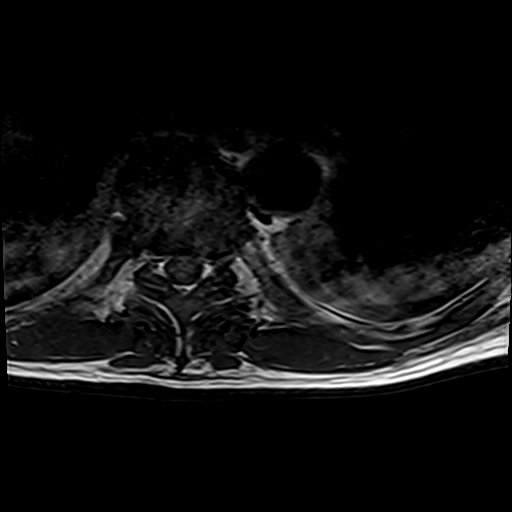
[im 17/39]
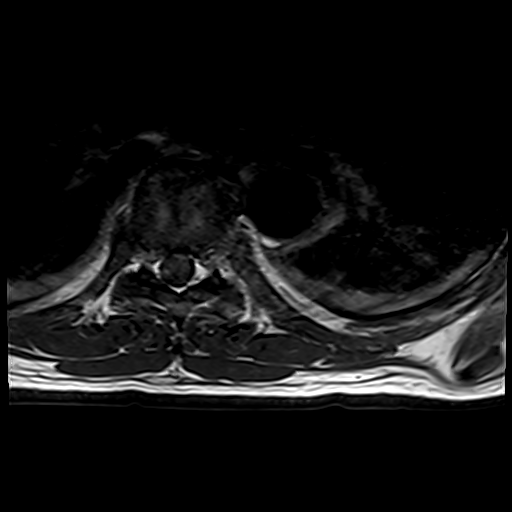
[im 22/39]
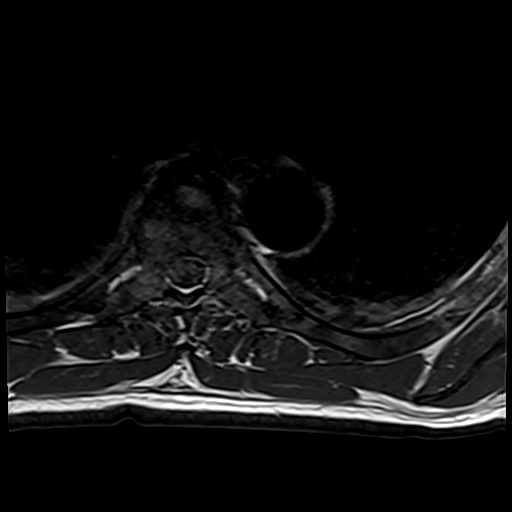
[im 28/39]
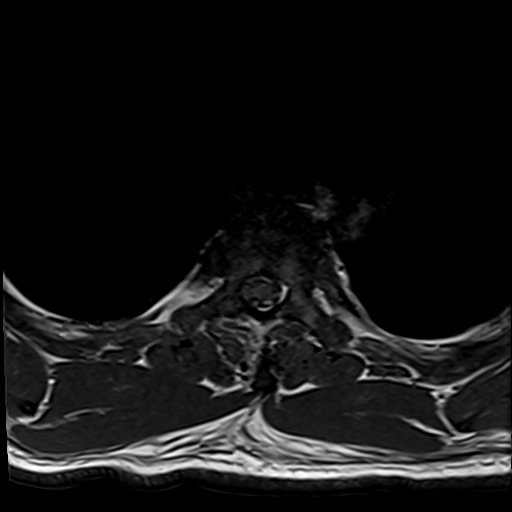
[im 33/39]
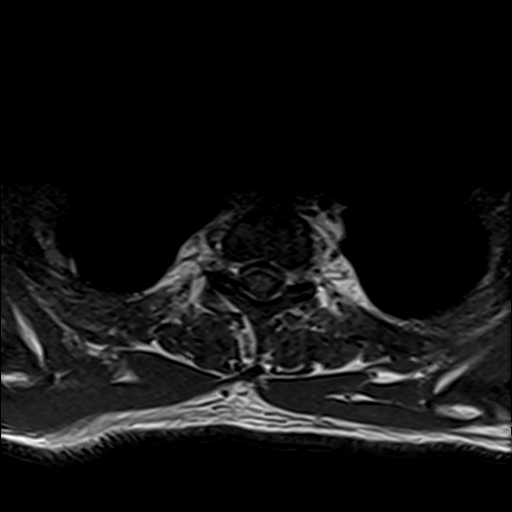
[im 39/39]
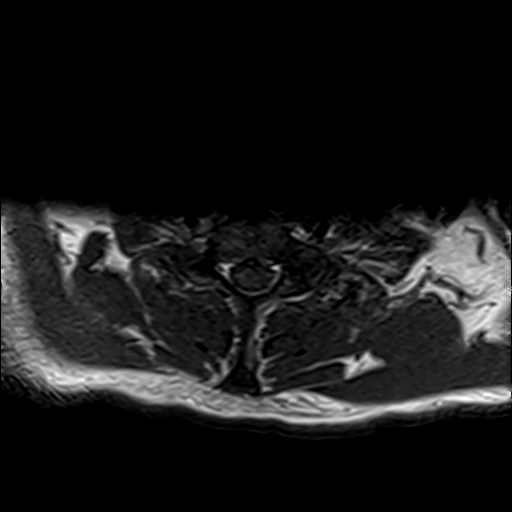

[Series 25: T1 fat-sat post-contrast · sagittal · 3.0mm · 1.33mm/px · 4 of 19 slices shown]
[im 1/19]
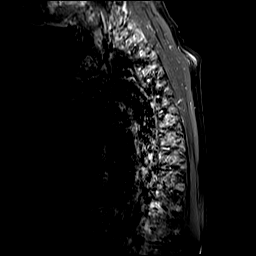
[im 7/19]
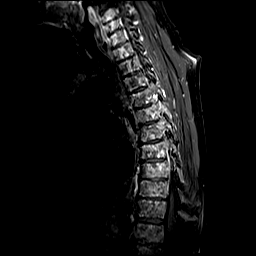
[im 13/19]
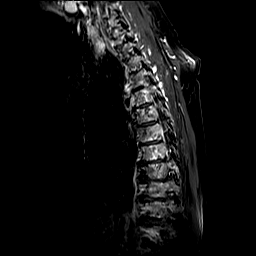
[im 19/19]
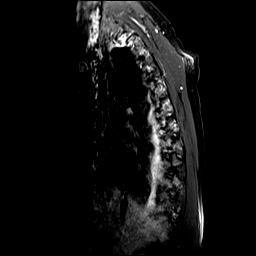

[32 of 48 positions shown; findings below may reference images not displayed]

FINDINGS: Intermittent motion degradation.

Alignment:  No significant spondylolisthesis.

Vertebrae: Multilevel Schmorl nodes. Vertebral body height is
otherwise maintained. There is diffuse heterogeneous signal
abnormality and patchy corresponding enhancement throughout the
visualized thoracic spine and posterior ribs compatible with diffuse
osseous metastatic disease. No evidence of pathologic compression
fracture.

Cord: No spinal cord signal abnormality is identified. No definite
dural-based metastatic disease is identified.

Paraspinal and other soft tissues: No definite soft tissue extension
of tumor is identified. Specifically, no paraspinal masses
identified.

Disc levels:

Mild-to-moderate disc degeneration throughout the thoracic spine.

Multilevel shallow disc bulges and facet arthrosis. No significant
focal disc herniation, spinal canal stenosis or neural foraminal
narrowing.
IMPRESSION: Diffuse osseous metastatic disease throughout the visualized
thoracic spine and posterior ribs.

No pathologic compression fracture.

No definite dural-based tumor is identified within the thoracic
spinal canal.

No soft tissue extension of tumor is identified.

Thoracic spondylosis as described without significant spinal canal
stenosis or neural foraminal narrowing.

## 2020-06-18 IMAGING — MR MR LUMBAR SPINE WO/W CM
6 of 7 series · 32 of 48 positions shown · IV contrast (gadavist)
Comparison: CT abdomen/pelvis [DATE].

CLINICAL DATA: Metastatic disease evaluation. Metastatic prostate
cancer. Back pain.

EXAM:
MRI LUMBAR SPINE WITHOUT AND WITH CONTRAST
TECHNIQUE: Multiplanar and multiecho pulse sequences of the lumbar spine were
obtained without and with intravenous contrast.
CONTRAST:  7.5mL GADAVIST GADOBUTROL 1 MMOL/ML IV SOLN

[Series 1: T2 · sagittal · 4.0mm · 1.02mm/px · 5 of 15 slices shown (1 of 2)]
[im 1/15]
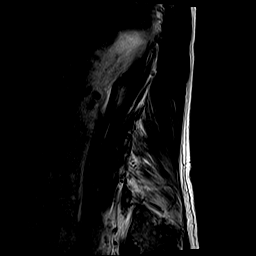
[im 4/15]
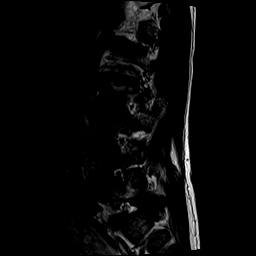
[im 8/15]
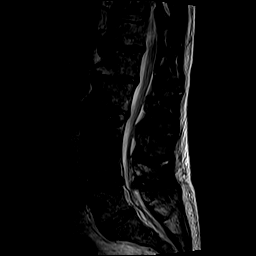
[im 11/15]
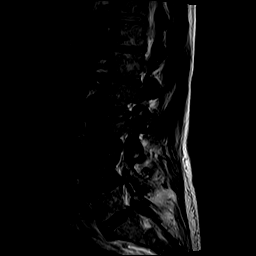
[im 15/15]
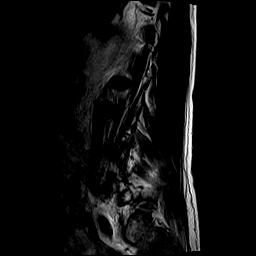

[Series 2: T1 · sagittal · 4.0mm · 1.02mm/px · 5 of 15 slices shown (1 of 2)]
[im 1/15]
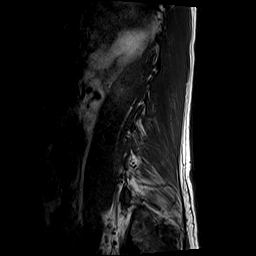
[im 4/15]
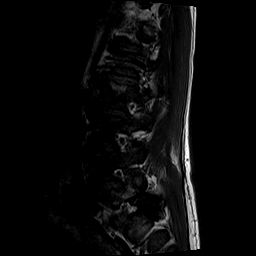
[im 8/15]
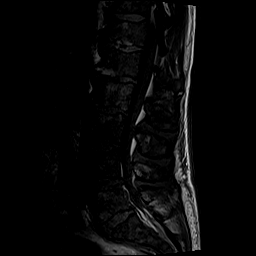
[im 11/15]
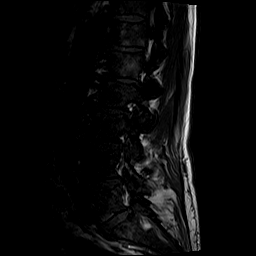
[im 15/15]
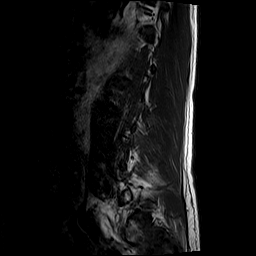

[Series 3: STIR · sagittal · 4.0mm · 0.51mm/px · 2 of 15 slices shown]
[im 1/15]
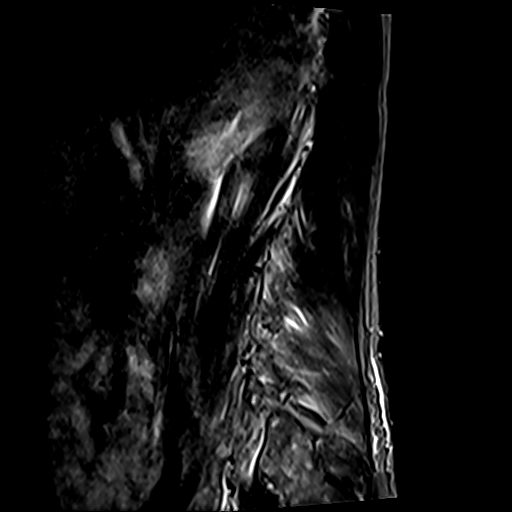
[im 5/15]
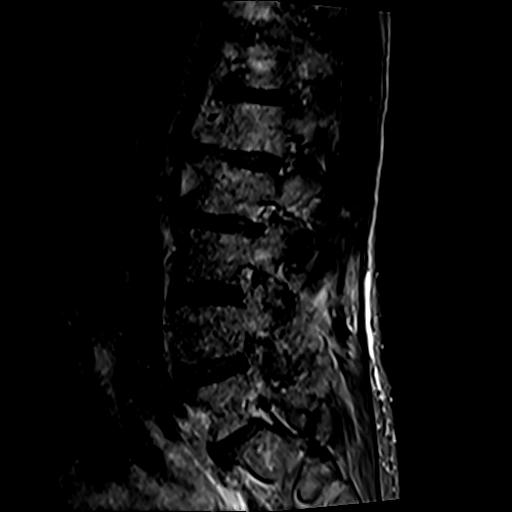

[Series 4: T2 · axial · 4.0mm · 0.78mm/px · z∈[-485,-265]mm · 8 of 36 slices shown (2 of 2)]
[im 1/36]
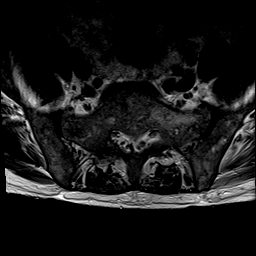
[im 4/36]
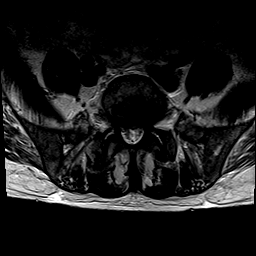
[im 12/36]
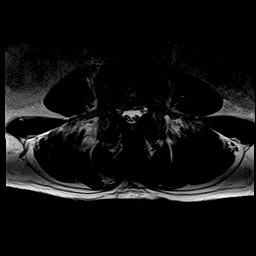
[im 16/36]
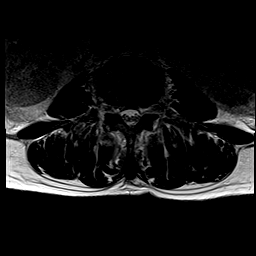
[im 20/36]
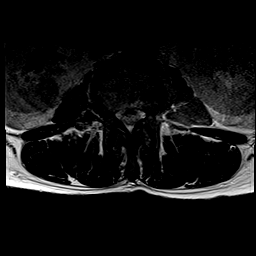
[im 24/36]
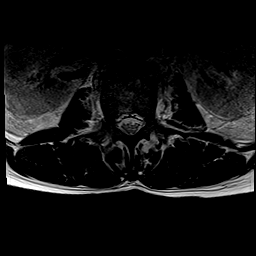
[im 32/36]
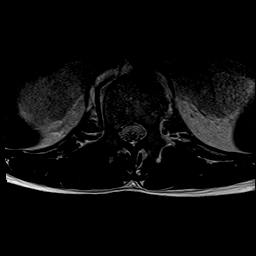
[im 36/36]
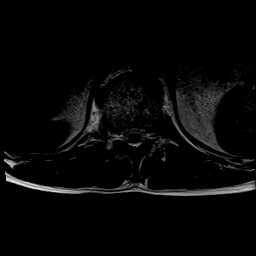

[Series 5: T1 · axial · 4.0mm · 0.39mm/px · z∈[-485,-265]mm · 8 of 36 slices shown (2 of 2)]
[im 1/36]
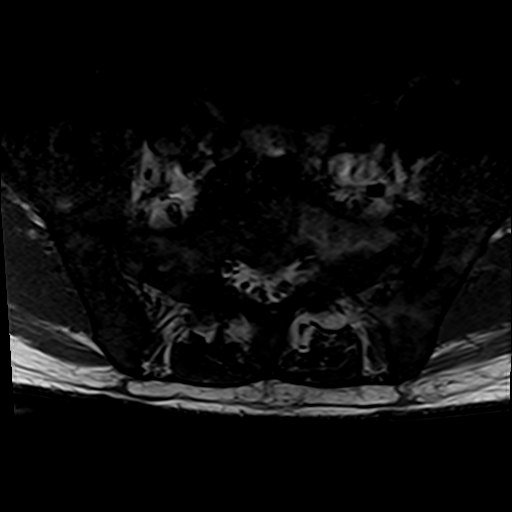
[im 4/36]
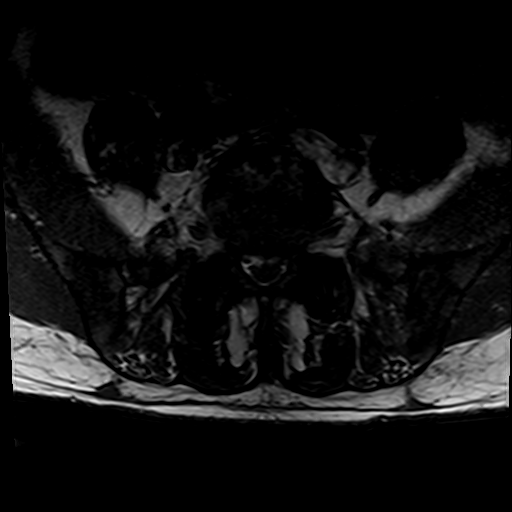
[im 12/36]
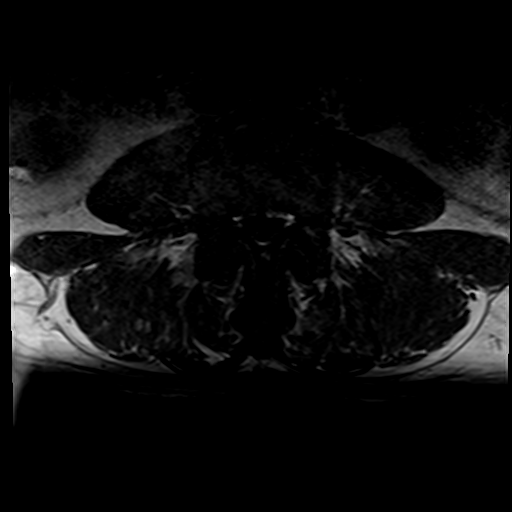
[im 16/36]
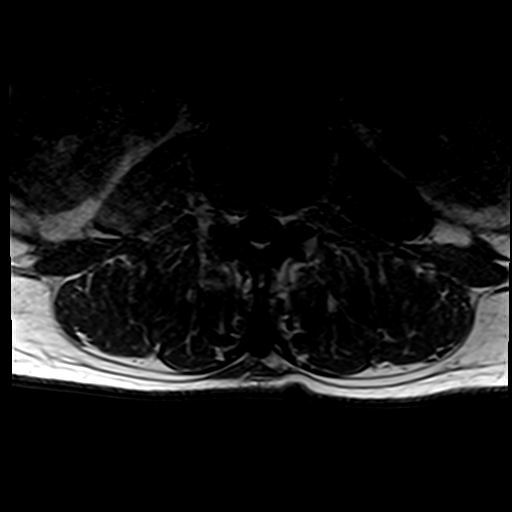
[im 20/36]
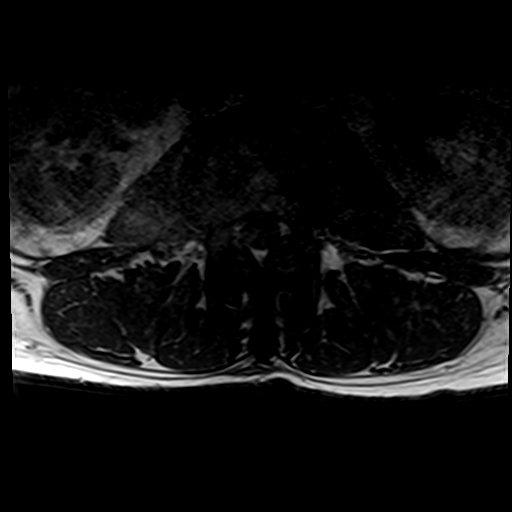
[im 24/36]
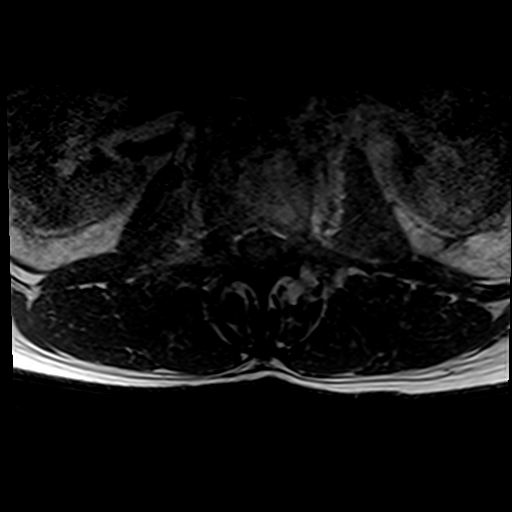
[im 32/36]
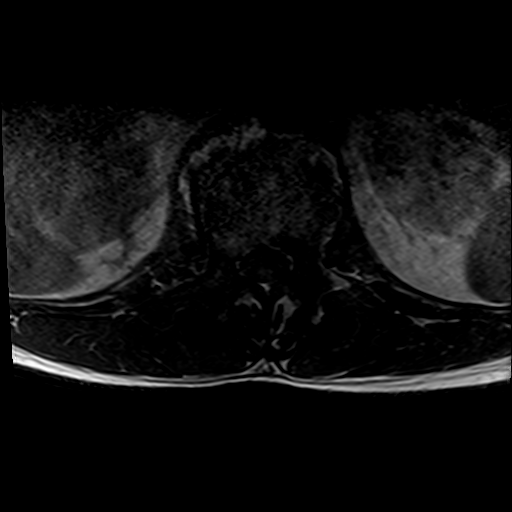
[im 36/36]
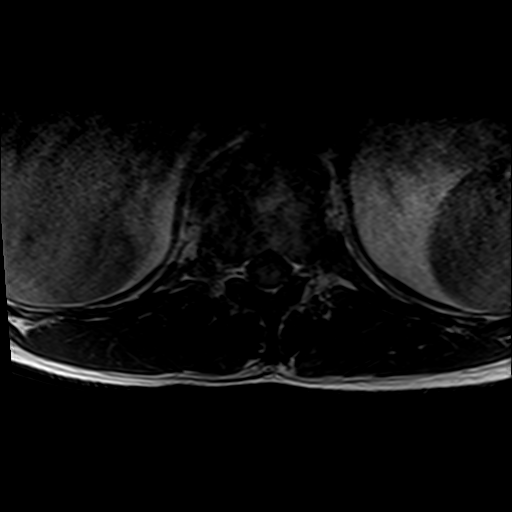

[Series 6: T1 fat-sat post-contrast · sagittal · 4.0mm · 1.02mm/px · 4 of 15 slices shown]
[im 1/15]
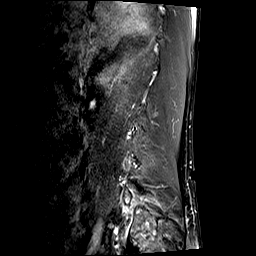
[im 5/15]
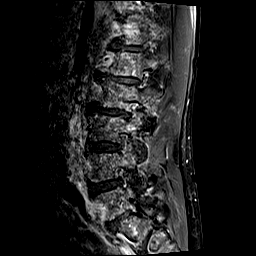
[im 10/15]
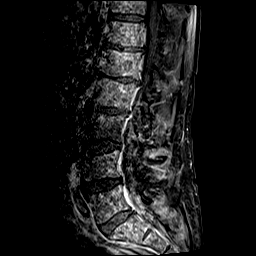
[im 15/15]
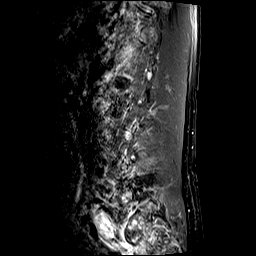

[32 of 48 positions shown; findings below may reference images not displayed]

FINDINGS: Segmentation: 5 lumbar vertebrae. The caudal most well-formed
intervertebral disc space is designated L5-S1.

Alignment:  No significant spondylolisthesis.

Vertebrae: Multilevel Schmorl nodes. Mild chronic L4 superior
endplate compression deformity. Vertebral body height is otherwise
maintained. No acute pathologic compression fracture. There is
diffuse heterogeneous osseous signal abnormality and patchy
enhancement throughout the visualized lumbar spine, sacrum and
bilateral iliac bones compatible with diffuse osseous metastatic
disease.

Conus medullaris and cauda equina: Conus extends to the L2 level. No
signal abnormality within the visualized distal spinal cord. No
definite dural-based tumor is identified.

Paraspinal and other soft tissues: No extraosseous tumor is
identified. Specifically, no paraspinal soft tissue mass is
appreciated.

Disc levels:

Multilevel disc degeneration. Most notably, there is moderate to
moderately advanced disc degeneration at T12-L1, L1-L2 and L2-L3.

T12-L1: Small disc bulge. Mild facet arthrosis. Mild relative spinal
canal narrowing. No significant foraminal stenosis.

L1-L2: Disc bulge with endplate spurring. Superimposed small left
subarticular/foraminal disc protrusion. Mild facet arthrosis. Mild
bilateral subarticular and central canal narrowing without
appreciable nerve root impingement. Mild left neural foraminal
narrowing.

L2-L3: Disc bulge with endplate spurring. Moderate facet arthrosis
with ligamentum flavum hypertrophy. Moderate subtle subarticular
stenosis with potential to affect either descending L3 nerve root.
Moderate central canal stenosis. Moderate bilateral neural foraminal
narrowing.

L3-L4: Disc bulge with endplate spurring. Superimposed left
foraminal/extraforaminal disc protrusion at site of posterior
annular fissure. Moderate facet arthrosis with slight ligamentum
flavum hypertrophy. Mild bilateral subarticular narrowing without
appreciable nerve root impingement. Central canal patent. Moderate
left neural foraminal narrowing

L4-L5: Disc bulge with superimposed broad-based shallow central disc
protrusion. Facet arthrosis (mild right, moderate/advanced left).
Ligamentum flavum hypertrophy. Bilateral subarticular narrowing
(moderate right, mild left) with potential to affect the descending
right L5 nerve root. Mild to moderate central canal narrowing.
Moderate bilateral neural foraminal narrowing.

L5-S1: Disc bulge with slight endplate spurring. Advanced facet
arthrosis with ligamentum flavum hypertrophy. Prominence of the
dorsal epidural fat. Mild bilateral subarticular narrowing without
appreciable nerve root impingement. No significant foraminal
stenosis.
IMPRESSION: Findings compatible with diffuse osseous metastatic disease within
the visualized lumbar spine, sacrum and bilateral iliac bones.

No definite dural-based tumor is identified within the lumbar spinal
canal.

No extraosseous tumor extension is identified.

Lumbar spondylosis as outlined. Canal stenosis is greatest at L2-L3
(moderate) and L4-L5 (mild/moderate). Potential nerve root
impingement within the spinal canal at L2-L3 and L4-L5. Sites of
mild and moderate neural foraminal narrowing as detailed.

## 2020-06-18 IMAGING — US US EXTREM LOW VENOUS
1 series · 13 of 24 positions shown · non-contrast
Comparison: None.

CLINICAL DATA: Initial evaluation for acute pain for several weeks.



[Series 1: us venous img lower bilat (dvt) · portal-venous · 13 of 58 slices shown]
[im 1/58]
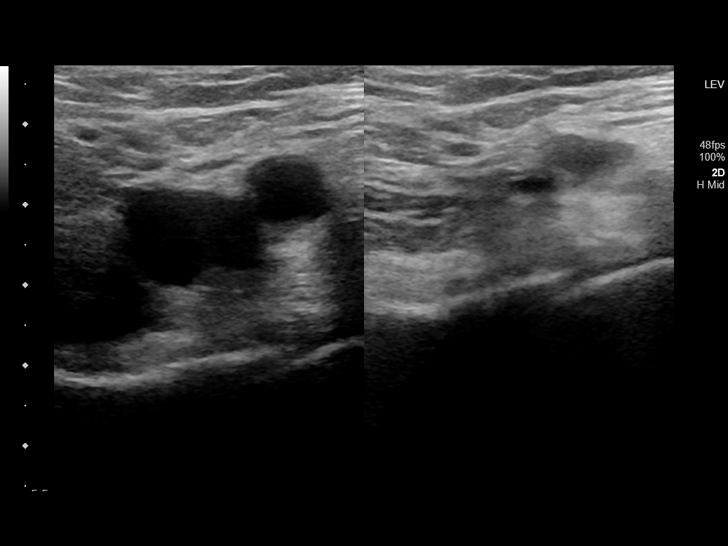
[im 5/58]
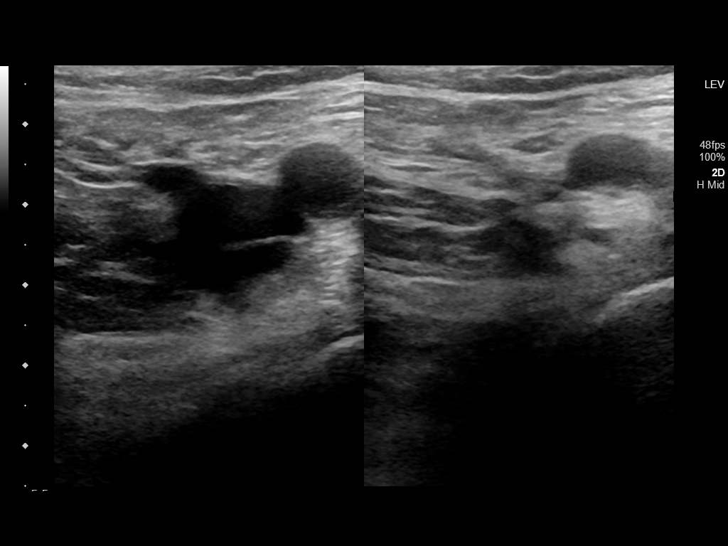
[im 10/58]
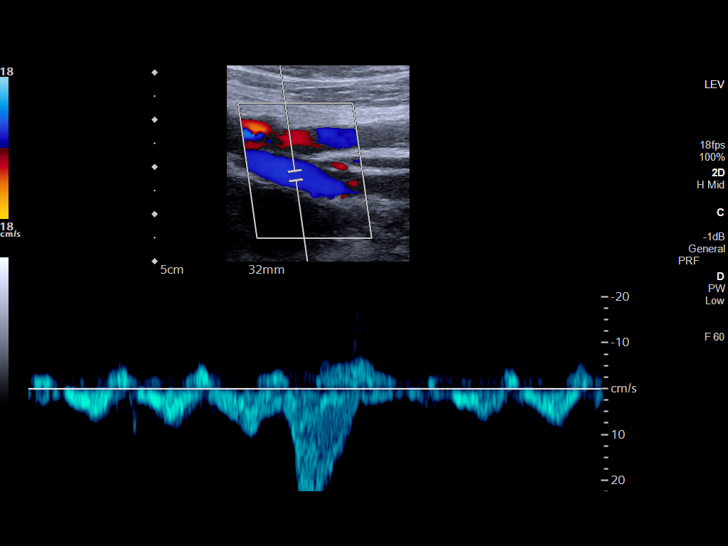
[im 15/58]
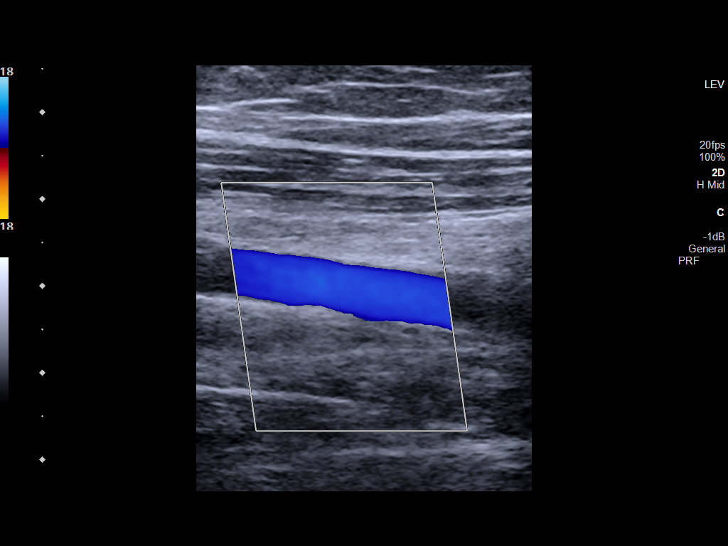
[im 20/58]
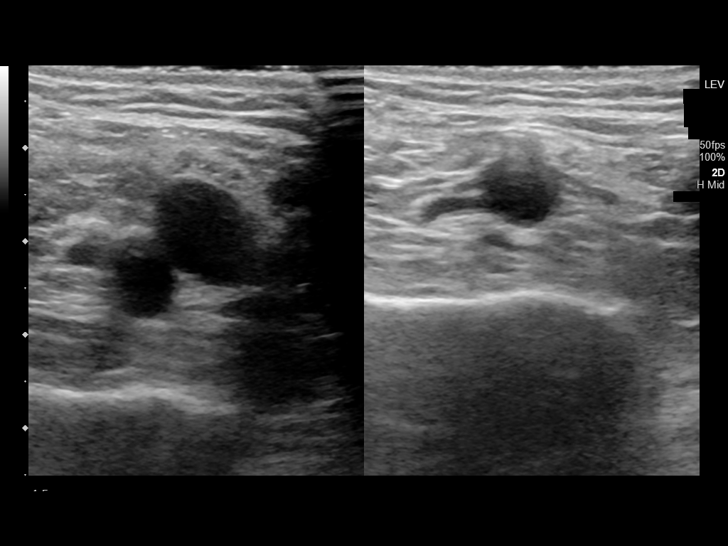
[im 25/58]
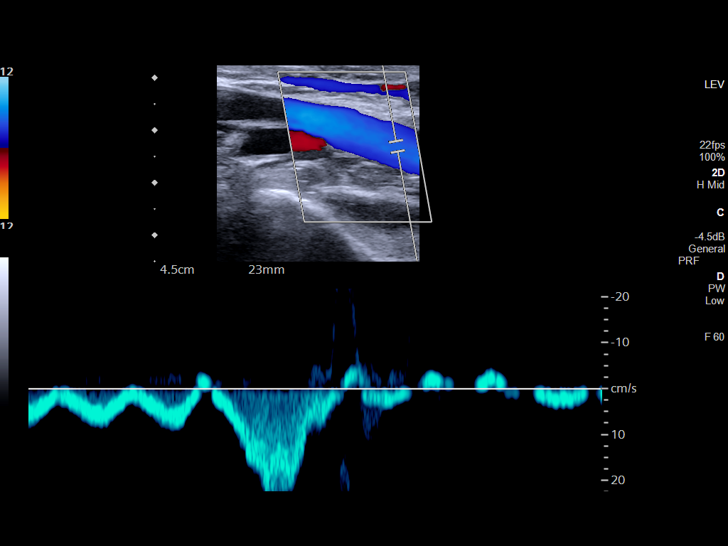
[im 30/58]
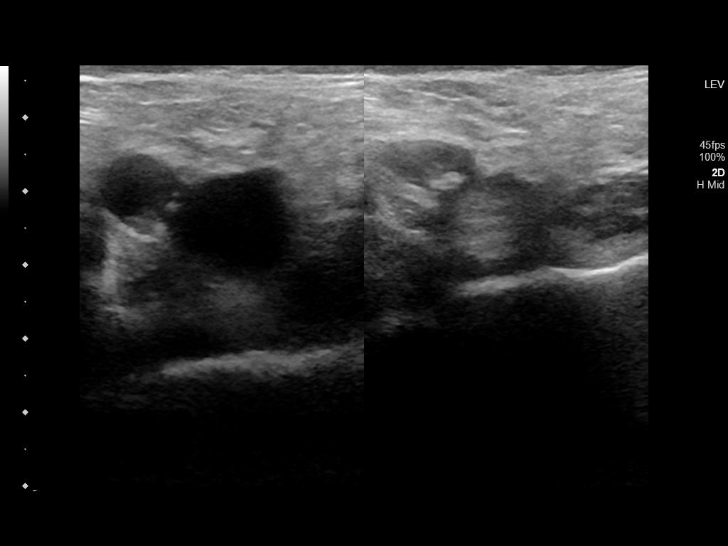
[im 33/58]
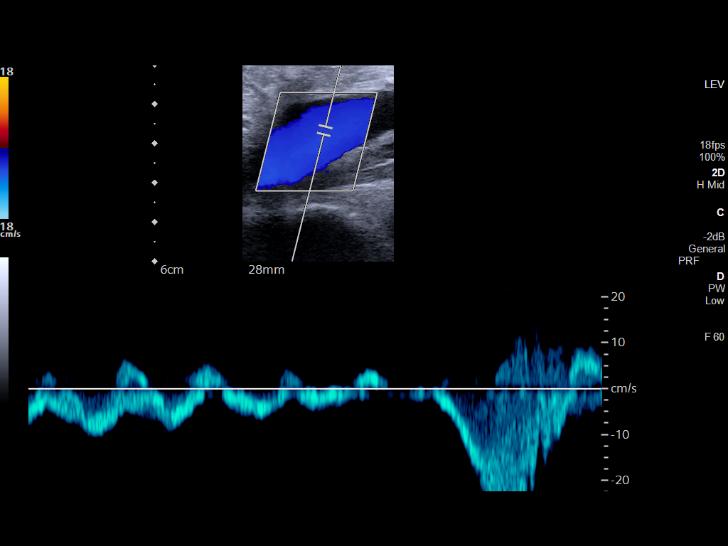
[im 38/58]
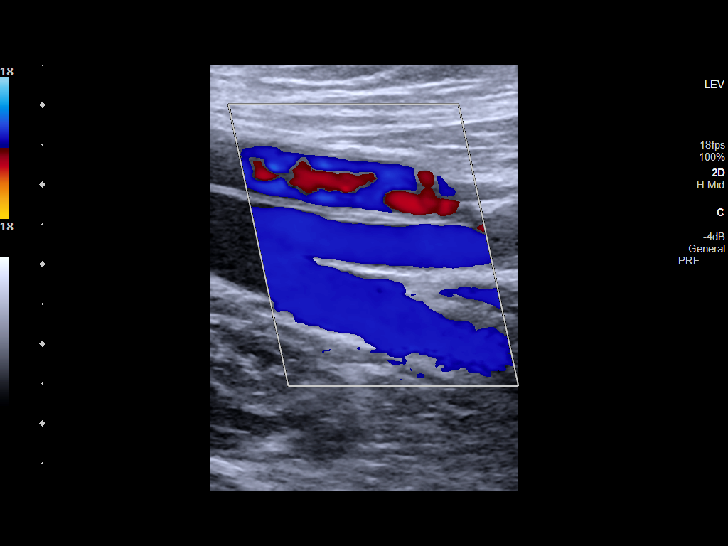
[im 43/58]
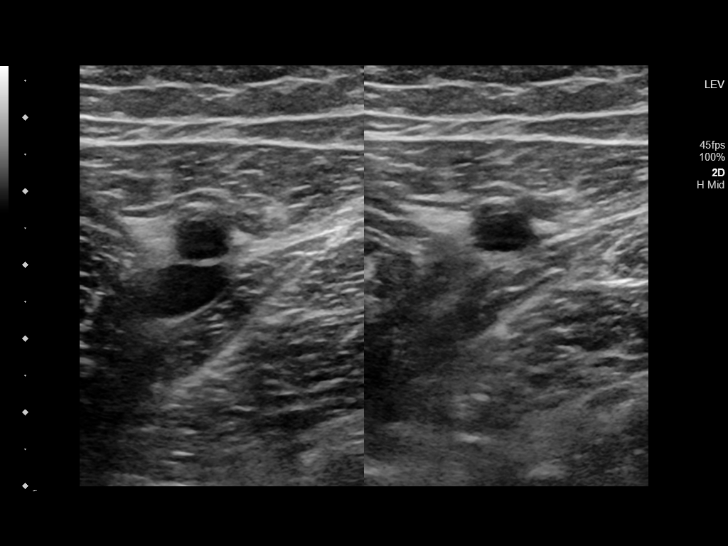
[im 48/58]
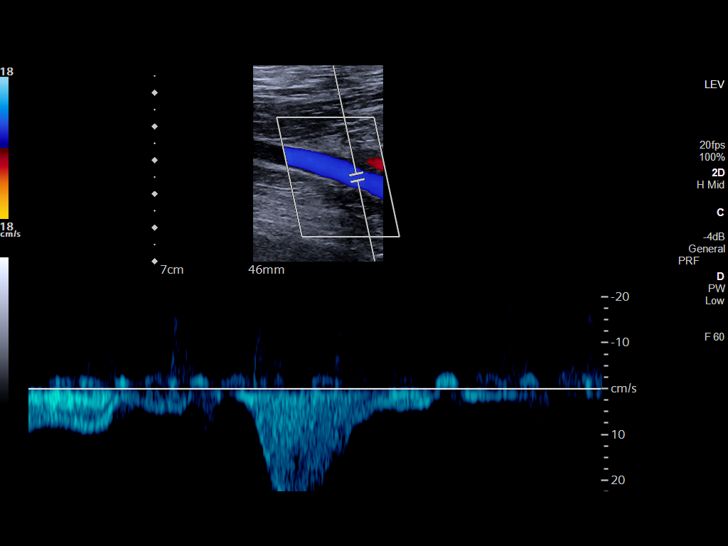
[im 53/58]
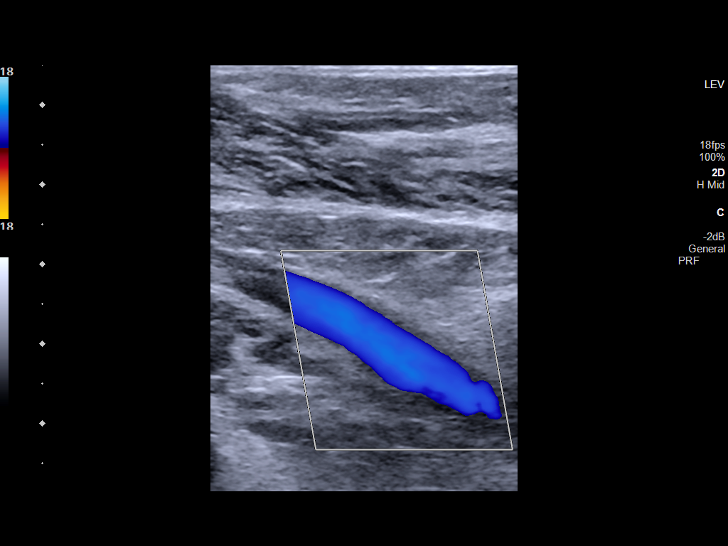
[im 58/58]
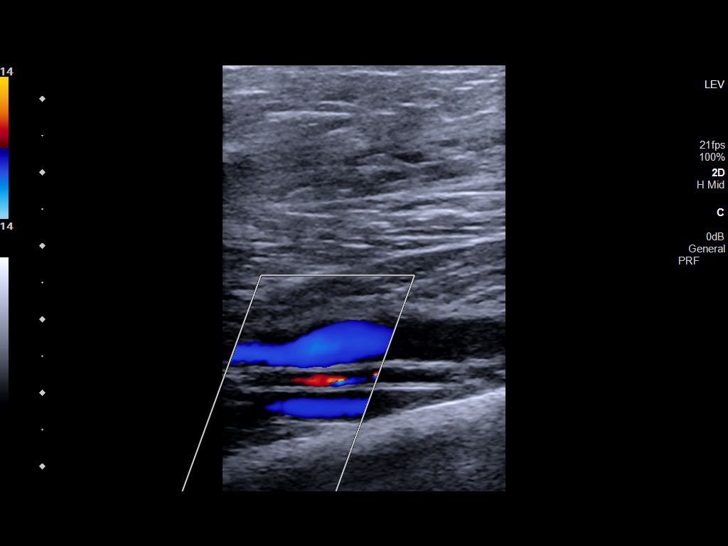

[13 of 24 positions shown; findings below may reference images not displayed]

FINDINGS: RIGHT LOWER EXTREMITY

Common Femoral Vein: No evidence of thrombus. Normal
compressibility, respiratory phasicity and response to augmentation.

Saphenofemoral Junction: No evidence of thrombus. Normal
compressibility and flow on color Doppler imaging.

Profunda Femoral Vein: No evidence of thrombus. Normal
compressibility and flow on color Doppler imaging.

Femoral Vein: No evidence of thrombus. Normal compressibility,
respiratory phasicity and response to augmentation.

Popliteal Vein: No evidence of thrombus. Normal compressibility,
respiratory phasicity and response to augmentation.

Calf Veins: No evidence of thrombus. Normal compressibility and flow
on color Doppler imaging.

Superficial Great Saphenous Vein: No evidence of thrombus. Normal
compressibility.

Venous Reflux:  None.

Other Findings:  None.

LEFT LOWER EXTREMITY

Common Femoral Vein: No evidence of thrombus. Normal
compressibility, respiratory phasicity and response to augmentation.

Saphenofemoral Junction: No evidence of thrombus. Normal
compressibility and flow on color Doppler imaging.

Profunda Femoral Vein: No evidence of thrombus. Normal
compressibility and flow on color Doppler imaging.

Femoral Vein: No evidence of thrombus. Normal compressibility,
respiratory phasicity and response to augmentation.

Popliteal Vein: No evidence of thrombus. Normal compressibility,
respiratory phasicity and response to augmentation.

Calf Veins: No evidence of thrombus. Normal compressibility and flow
on color Doppler imaging.

Superficial Great Saphenous Vein: No evidence of thrombus. Normal
compressibility.

Venous Reflux:  None.

Other Findings:  None.
IMPRESSION: No evidence of deep venous thrombosis in either lower extremity.

## 2020-06-18 MED ORDER — GADOBUTROL 1 MMOL/ML IV SOLN
7.5000 mL | Freq: Once | INTRAVENOUS | Status: AC | PRN
  Administered 2020-06-18: 15:00:00 7.5 mL via INTRAVENOUS

## 2020-06-18 MED ORDER — SODIUM CHLORIDE 0.9 % IV SOLN: INTRAVENOUS | Status: DC | PRN

## 2020-06-18 MED ORDER — ENSURE ENLIVE PO LIQD
237.0000 mL | Freq: Three times a day (TID) | ORAL | Status: DC
  Administered 2020-06-18 – 2020-06-20 (×2): 237 mL via ORAL

## 2020-06-18 MED ORDER — SODIUM CHLORIDE 0.9 % IV BOLUS
1000.0000 mL | Freq: Once | INTRAVENOUS | Status: AC
  Administered 2020-06-18: 1000 mL via INTRAVENOUS

## 2020-06-18 MED ORDER — SODIUM CHLORIDE 0.9 % IV SOLN
1.0000 g | INTRAVENOUS | Status: DC
  Administered 2020-06-18: 04:00:00 1 g via INTRAVENOUS
  Filled 2020-06-18: qty 1

## 2020-06-18 MED ORDER — DEXAMETHASONE SODIUM PHOSPHATE 10 MG/ML IJ SOLN
8.0000 mg | Freq: Four times a day (QID) | INTRAMUSCULAR | Status: DC
  Administered 2020-06-18 – 2020-06-19 (×3): 8 mg via INTRAVENOUS
  Filled 2020-06-18 (×3): qty 1

## 2020-06-18 MED ORDER — SODIUM CHLORIDE 0.9 % IV SOLN
1.0000 g | Freq: Three times a day (TID) | INTRAVENOUS | Status: DC
  Administered 2020-06-18 – 2020-06-19 (×4): 1 g via INTRAVENOUS
  Filled 2020-06-18 (×5): qty 1

## 2020-06-18 MED ORDER — SODIUM CHLORIDE 0.9 % IV BOLUS
250.0000 mL | Freq: Once | INTRAVENOUS | Status: AC
  Administered 2020-06-18: 250 mL via INTRAVENOUS

## 2020-06-18 MED ORDER — IBUPROFEN 400 MG PO TABS
400.0000 mg | ORAL_TABLET | Freq: Four times a day (QID) | ORAL | Status: DC
  Administered 2020-06-18 – 2020-06-20 (×7): 400 mg via ORAL
  Filled 2020-06-18 (×7): qty 1

## 2020-06-18 MED ORDER — SORBITOL 70 % SOLN
30.0000 mL | Freq: Every day | Status: DC
  Administered 2020-06-18: 30 mL via ORAL
  Filled 2020-06-18 (×2): qty 30

## 2020-06-18 MED ORDER — ADULT MULTIVITAMIN W/MINERALS CH
1.0000 | ORAL_TABLET | Freq: Every day | ORAL | Status: DC
  Administered 2020-06-18 – 2020-06-20 (×3): 1 via ORAL
  Filled 2020-06-18 (×2): qty 1

## 2020-06-18 NOTE — Progress Notes (Signed)
   06/18/20 0001  Assess: MEWS Score  Temp (!) 102.8 F (39.3 C)  BP (!) 109/57  Pulse Rate (!) 109  Resp 18  SpO2 96 %  O2 Device Room Air  Assess: MEWS Score  MEWS Temp 2  MEWS Systolic 0  MEWS Pulse 1  MEWS RR 0  MEWS LOC 0  MEWS Score 3  MEWS Score Color Yellow  Assess: if the MEWS score is Yellow or Red  Were vital signs taken at a resting state? Yes  Focused Assessment Change from prior assessment (see assessment flowsheet)  Early Detection of Sepsis Score *See Row Information* Medium  MEWS guidelines implemented *See Row Information* Yes  Treat  MEWS Interventions Administered prn meds/treatments;Escalated (See documentation below)  Take Vital Signs  Increase Vital Sign Frequency  Yellow: Q 2hr X 2 then Q 4hr X 2, if remains yellow, continue Q 4hrs  Escalate  MEWS: Escalate Yellow: discuss with charge nurse/RN and consider discussing with provider and RRT  Notify: Charge Nurse/RN  Name of Charge Nurse/RN Notified Location manager, RN  Date Charge Nurse/RN Notified 06/18/20  Time Charge Nurse/RN Notified 0007  Notify: Provider  Provider Name/Title Eugenie Norrie, MD  Date Provider Notified 06/18/20  Time Provider Notified 0007  Notification Type Page  Notification Reason Change in status

## 2020-06-18 NOTE — Progress Notes (Signed)
   06/18/20 0357  Assess: MEWS Score  Temp 99.3 F (37.4 C)  BP 108/60  Pulse Rate 90  Resp 18  SpO2 95 %  O2 Device Room Air  Assess: MEWS Score  MEWS Temp 0  MEWS Systolic 0  MEWS Pulse 0  MEWS RR 0  MEWS LOC 0  MEWS Score 0  MEWS Score Color Green  Assess: if the MEWS score is Yellow or Red  Were vital signs taken at a resting state? Yes  Early Detection of Sepsis Score *See Row Information* Low  MEWS guidelines implemented *See Row Information* No, previously yellow, continue vital signs every 4 hours

## 2020-06-18 NOTE — Progress Notes (Signed)
   06/18/20 0208  Assess: MEWS Score  Temp 98.8 F (37.1 C)  BP (!) 95/52  Pulse Rate 87  Resp 16  SpO2 (!) 84 %  O2 Device Room Air  Assess: MEWS Score  MEWS Temp 0  MEWS Systolic 1  MEWS Pulse 0  MEWS RR 0  MEWS LOC 0  MEWS Score 1  MEWS Score Color Green  Assess: if the MEWS score is Yellow or Red  Were vital signs taken at a resting state? Yes  Early Detection of Sepsis Score *See Row Information* Low  MEWS guidelines implemented *See Row Information* No, previously yellow, continue vital signs every 4 hours  Treat  Patients response to intervention Effective

## 2020-06-18 NOTE — Progress Notes (Signed)
Pt arrived to floor at 1850. Telemetry placed and pt oriented to the room and call light. Pt alert and oriented x 4

## 2020-06-18 NOTE — Progress Notes (Signed)
Initial Nutrition Assessment  DOCUMENTATION CODES:   Not applicable  INTERVENTION:   -Ensure Enlive po TID, each supplement provides 350 kcal and 20 grams of protein -MVI with minerals daily  NUTRITION DIAGNOSIS:   Increased nutrient needs related to chronic illness (prostate cancer) as evidenced by estimated needs.  GOAL:   Patient will meet greater than or equal to 90% of their needs  MONITOR:   PO intake,Supplement acceptance,Labs,Weight trends,Skin,I & O's  REASON FOR ASSESSMENT:   Malnutrition Screening Tool    ASSESSMENT:   Cody Vaughan is a 60 y.o. male with medical history significant for presumptive prostate cancer with metastasis to the bones, tobacco use disorder, who presented to [facility] ED due to sudden onset dizziness and almost passing out on the day of presentation after his prostate biopsy.  Pt admitted with near syncope likely secondary to orthostatic hypotension.   Reviewed I/O's: +1.1 L x 24 hours and +2.8 L since admission  UOP: 1.6 L x 24 hours  Pt unavailable at time of visit. Attempted to speak with pt via call to hospital room phone, however, unable to reach.   Per H&P, pt with poor oral intake PTA. Pt with variable oral intake. Noted meal completion 5-90%.   Reviewed wt hx; pt has experienced a 3.4% wt loss over the past 3 weeks, which is not significant for time frame.   Medications reviewed and include senokot and 0.9% sodium chloride infusion @ 125 ml/hr.   Per TOC notes, pt is declining SNF, but amenable to charity home health services.   Labs reviewed: Na: 132.   Diet Order:   Diet Order            Diet regular Room service appropriate? Yes; Fluid consistency: Thin  Diet effective now                 EDUCATION NEEDS:   No education needs have been identified at this time  Skin:  Skin Assessment: Reviewed RN Assessment  Last BM:  06/16/20  Height:   Ht Readings from Last 1 Encounters:  06/16/20 5\' 11"  (1.803 m)     Weight:   Wt Readings from Last 1 Encounters:  06/16/20 75.8 kg    Ideal Body Weight:  78.2 kg  BMI:  Body mass index is 23.29 kg/m.  Estimated Nutritional Needs:   Kcal:  2100-2300  Protein:  115-130 grams  Fluid:  > 2 L    Loistine Chance, RD, LDN, [place] Registered Dietitian II Certified Diabetes Care and Education Specialist Please refer to [facility] for RD and/or RD on-call/weekend/after hours pager

## 2020-06-18 NOTE — Consult Note (Signed)
Pharmacy Antibiotic Note  Cody Vaughan is a 60 y.o. male admitted on 06/16/2020 with c/f Intra-abdominal infection. Pt has history of prior epididymitis followed conservatively, HepC, remote h/o substance abuse (EtOH/Cocaine), & Metastatic Prostate CA (in bones) still undergoing full workup. Pt experiencing near syncope after prostate biopsy which was c/b ABLA 2/2 significant rectal and urethral bleeding. Pharmacy has been consulted for Merrem dosing.  Plan: (Pt spike fever of 100.7-100.59F this AM. ) Will initiate Merrem 1g q8h per renal function.  Height: 5\' 11"  (180.3 cm) Weight: 75.8 kg (167 lb) IBW/kg (Calculated) : 75.3  Temp (24hrs), Avg:99.9 F (37.7 C), Min:98.5 F (36.9 C), Max:102.8 F (39.3 C)  Recent Labs  Lab 06/16/20 1109 06/17/20 0144 06/18/20 0904  WBC 9.8 7.3 7.7  CREATININE 1.05 0.74 0.77  LATICACIDVEN  --   --  0.9    Estimated Creatinine Clearance: 103.3 mL/min (by C-G formula based on SCr of 0.77 mg/dL).    No Known Allergies  Antimicrobials this admission: (4/15) Ciprofloxacin 400mg  x1; Gentamicin 370mg  x1 in ED (4/16) no abx. (4/17) Ceftriaxone 1g >> Merrem 1g q8h  Dose adjustments this admission: N/A  Microbiology results: 4/17 BCx: sent/pending (after abx on 4/15) 4/17 UCx: sent/pending   Thank you for allowing pharmacy to be a part of this patient's care.  Cody Vaughan 06/18/2020 11:34 AM

## 2020-06-18 NOTE — Consult Note (Addendum)
Patient is a 60 year old male highly likely to have extensive metastatic prostate cancer given imaging results and PSA greater than 600.  Biopsy results are pending.  He was admitted to the hospital with increasing weakness and fatigue as well as pain control.  MRI results from June 18, 2020 reviewed independently with no suspicion of cauda equina syndrome or spinal cord impingement, although awaiting lumbar spine official read.  Repeat PSA is pending at time of dictation.  Agree with dexamethasone and liberal use of narcotics for pain control. May want to consider PCA.  Agree with urology to use aggressive IV fluids to maintain blood pressure.  Will ensure patient's primary medical oncologist, Dr. Tasia Catchings, as well as Radiation Oncology, Dr. Donella Stade, evaluate patient tomorrow.  Will also consult palliative care, Josh Borders, to evaluate as well.

## 2020-06-18 NOTE — Progress Notes (Addendum)
PROGRESS NOTE   Cody Vaughan  YYQ:[id number] DOB: 01-Oct-1960 DOA: 06/16/2020 PCP: Patient, No Pcp Per (Inactive)  Brief Narrative:   45 white male Prior epididymitis followed conservatively Hep C Previous substance abuse/EtOH/cocaine in the remote past Metastatic prostate CA PSA 3678 followed by Dr. Tasia Catchings oncology/[facility]. [facility] neurology Underwent transrectal biopsy 6/15-developed hematuria, dark stool-near syncope with orthostasis without LOC-was hypotensive in ED Hemoglobin down from 13-->10 Creatinine elevated 0.7--->.05    Hospital-Problem based course  ?  Sepsis Spiked fever (despite being on ceftriaxone IV) 102.8, hypotensive in addition at this time-blood cultures pending from 4/17 a.m. Meropenem has been added at the recommendation of Dr. Diamantina Providence who will discuss with the family Bolus IV fluid 250 cc/then 1000cc Keep on IV fluid-follow lactic acid Discussed with wife at the bedside plans etc. Presyncope hypotension Combination of blood loss anemia Orthostatics ++ Continue fluids 125 cc/H NS Pain is better controlled with p.o. Prostatic biopsy 4/15 Probable metastatic prostate cancer still being worked up by Dr. 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 Diamantina Providence the scenario and he is agreeable to plan-with rising alk phos this is probably metastatic process I will get imaging of entire spine to rule out metastasis process--may need XRT? AKI on admission Resolving with fluids Recheck periodically labs  DVT  prophylaxis: SCD Code Status: Full Family Communication: d/w wife at bedside Disposition:  Status is: Observation  The patient will require care spanning > 2 midnights and should be moved to inpatient because: Hemodynamically unstable, Persistent severe electrolyte disturbances, Altered mental status and Ongoing diagnostic testing needed not appropriate for outpatient work up  Dispo:  Patient From: Home  Planned Disposition: Home  Medically stable for discharge: No      Consultants:   None at this time  Procedures: Prostatic biopsy  Antimicrobials: No  Subjective:  Pain 6/10 now Events noted overnight has significant fevers-does not feel chills Cannot report any hematuria or dysuria to me at this time States that the pain is primarily in the upper sacral and lower lumbar region His wife is at the bedside-I had a long discussion with her about this probably being cancer related pain/fever/and hope that this isnt a nerve related issue I have reached out to Dr. Diamantina Providence for further input \At his request we have engaged Dr. Grayland Ormond for Recommendations as well from a Prostate cancer perspective  Objective: Vitals:   06/18/20 0217 06/18/20 0357 06/18/20 0731 06/18/20 0913  BP:  108/60 (!) 101/53 (!) 104/58  Pulse: 89 90 100 (!) 106  Resp:  18 15   Temp:  99.3 F (37.4 C) (!) 100.7 F (38.2 C)   TempSrc:  Oral Oral   SpO2: 94% 95% 94%   Weight:      Height:        Intake/Output Summary (Last 24 hours) at 06/18/2020 1108 Last data filed at 06/18/2020 1011 Gross per 24 hour  Intake 2825.71 ml  Output 1175 ml  Net 1650.71 ml   Filed Weights   06/16/20 1047  Weight: 75.8 kg    Examination:  eomi ncat young  looking male well built cta b s1 s 2no m abd soft Restricted ROM to Hips and knees 2/2 pain--reflexes are brisk in knees.  No foot drop but overall is weak in Thighs and hip flexors--Anal wink deferred/cremasteric reflexes deferres   Data Reviewed: personally  reviewed   Data BUNs/creatinine 14/1.0--> 12/0.7 Alk phos 1081 Magnesium 1.5  Radiology Studies: DG Pelvis 1-2 Views  Result Date: 06/18/2020 CLINICAL DATA:  Fall on Friday. History of substance abuse. History of metastatic prostate cancer. EXAM: PELVIS - 1-2 VIEW COMPARISON:  None. FINDINGS: No evidence of acute osseous fracture or dislocation. Heterogeneous mineralization throughout the osseous pelvis, compatible with underlying osseous metastases, better demonstrated on earlier CT abdomen of 05/27/2020. Soft tissues about the pelvis are unremarkable. IMPRESSION: No acute findings. No evidence of acute osseous fracture or dislocation. Osseous metastases, better demonstrated on earlier CT abdomen of 05/27/2020. Electronically Signed   By: Franki Cabot M.D.   On: 06/18/2020 10:04   Scheduled Meds: . dexamethasone (DECADRON) injection  8 mg Intravenous Q6H  . nicotine  14 mg Transdermal Daily  . senna  2 tablet Oral Daily  . sorbitol  30 mL Oral Daily   Continuous Infusions: . sodium chloride 125 mL/hr at 06/18/20 0031  . cefTRIAXone (ROCEPHIN)  IV 1 g (06/18/20 0354)    LOS: 1 day   Time spent: 73  CRITICAL CARE Performed by: Nita Sells   Total critical care time: 20 minutes  Critical care time was exclusive of separately billable procedures and treating other patients.  Critical care was necessary to treat or prevent imminent or life-threatening deterioration.  Critical care was time spent personally by me on the following activities: development of treatment plan with patient and/or surrogate as well as nursing, discussions with consultants, evaluation of patient's response to treatment, examination of patient, obtaining history from patient or surrogate, ordering and performing treatments and interventions, ordering and review of laboratory studies, ordering and review of radiographic studies, pulse oximetry and re-evaluation of patient's condition.   Nita Sells, MD [facility] To contact the attending provider between 7A-7P or the covering provider during after hours 7P-7A, please log into the web site [email or web address] and access using universal [place] password for that web site. If you do not have the password, please call the hospital operator.  06/18/2020, 11:08 AM

## 2020-06-18 NOTE — TOC Initial Note (Addendum)
Transition of Care [facility]) - Initial/Assessment Note    Patient Details  Name: Cody Vaughan MRN: [id number] Date of Birth: 08/08/1960  Transition of Care [facility]) CM/SW Contact:    Magnus Ivan, LCSW Phone Number: 06/18/2020, 12:00 PM  Clinical Narrative:             CSW spoke with patient. Patient is uninsured. Patient reported he lives with his wife. Patient does not have a PCP and reported he needs one. Explained [facility] and other resources in [place]. Patient is interested in [facility] helping him get established with a PCP. Left handoff for weekday TOC to follow up on this due to weekend/holiday today. Patient declined need for medication assistance, says he can usually afford his meds. CSW explained that PT recommends SNF. Patient is uninsured so would need to find SNF that will accept LOG. Patient states he does not feel he "needs" to go to a SNF but is agreeable to charity HHPT being arranged. Confirmed home address in chart. Referral sent to [facility] with [facility], waiting for confirmation that they can accept this patient. Patient is also agreeable to recommendations for RW and 3 in 1. Referral made to [facility] with [employer] for charity DME. Emailed Financial Counseling Lindaann Slough) to inquire if they can help patient apply for Medicaid or other assistance.    3:43- Joelene Millin with [facility] unable to accept patient for charity services. Reached out to [facility], [facility], [facility], [facility], [facility], and [facility] to see if they have any availability.  Expected Discharge Plan: [place] Barriers to Discharge: Continued Medical Work up   Patient Goals and CMS Choice Patient states their goals for this hospitalization and ongoing recovery are:: home with charity home health CMS Medicare.gov Compare Post Acute Care list provided to:: Patient Choice offered to / list presented to : Patient  Expected Discharge Plan and Services Expected  Discharge Plan: [place]       Living arrangements for the past 2 months: Single Family Home                 DME Arranged: Gilford Rile rolling,3-N-1 DME Agency:  Celesta Aver) Date DME Agency Contacted: 06/18/20   Representative spoke with at DME Agency: Melene Muller [facility] Arranged: PT          Prior Living Arrangements/Services Living arrangements for the past 2 months: Single Family Home Lives with:: Spouse Patient language and need for interpreter reviewed:: Yes Do you feel safe going back to the place where you live?: Yes      Need for Family Participation in Patient Care: Yes (Comment) Care giver support system in place?: Yes (comment)   Criminal Activity/Legal Involvement Pertinent to Current Situation/Hospitalization: No - Comment as needed  Activities of Daily Living Home Assistive Devices/Equipment: None ADL Screening (condition at time of admission) Patient's cognitive ability adequate to safely complete daily activities?: Yes Is the patient deaf or have difficulty hearing?: Yes Does the patient have difficulty seeing, even when wearing glasses/contacts?: No Does the patient have difficulty concentrating, remembering, or making decisions?: No Patient able to express need for assistance with ADLs?: Yes Does the patient have difficulty dressing or bathing?: No Independently performs ADLs?: Yes (appropriate for developmental age) Does the patient have difficulty walking or climbing stairs?: No Weakness of Legs: None Weakness of Arms/Hands: None  Permission Sought/Granted Permission sought to share information with : Facility Contact Representative,Family Supports Permission granted to share information with :  Yes, Verbal Permission Granted  Share Information with NAME: [name] - wife  Permission granted to share info w AGENCY: DME, [place] agencies, PCP providers        Emotional Assessment       Orientation: : Oriented to Self,Oriented to  Place,Oriented to  Time,Oriented to Situation Alcohol / Substance Use: Not Applicable Psych Involvement: No (comment)  Admission diagnosis:  Syncope [R55] Post-operative pain [G89.18] Syncope, unspecified syncope type [R55] Postural dizziness with presyncope [R42, R55] Fall [W19.XXXA] Hematuria [R31.9] Patient Active Problem List   Diagnosis Date Noted  . Fall 06/17/2020  . Hematuria 06/17/2020  . Postural dizziness with presyncope 06/16/2020  . Prostate cancer ([place]) 06/14/2020  . Abnormal CT scan 06/07/2020  . Elevated alkaline phosphatase level 06/07/2020  . Goals of care, counseling/discussion 06/07/2020  . Bone lesion 06/07/2020  . Epididymo-orchitis with abscess 09/16/2012  . Febrile urinary tract infection 09/16/2012  . Scrotal wall abscess 09/16/2012   PCP:  Patient, No Pcp Per (Inactive) Pharmacy:   [id number] [address], [place] [address] Phone: [phone] Fax: [phone]     Social Determinants of Health (SDOH) Interventions    Readmission Risk Interventions No flowsheet data found.

## 2020-06-19 DIAGNOSIS — IMO0001 Reserved for inherently not codable concepts without codable children: Secondary | ICD-10-CM

## 2020-06-19 DIAGNOSIS — Z515 Encounter for palliative care: Secondary | ICD-10-CM

## 2020-06-19 DIAGNOSIS — G8918 Other acute postprocedural pain: Secondary | ICD-10-CM

## 2020-06-19 DIAGNOSIS — Z79818 Long term (current) use of other agents affecting estrogen receptors and estrogen levels: Secondary | ICD-10-CM

## 2020-06-19 DIAGNOSIS — M899 Disorder of bone, unspecified: Secondary | ICD-10-CM

## 2020-06-19 DIAGNOSIS — R55 Syncope and collapse: Secondary | ICD-10-CM

## 2020-06-19 DIAGNOSIS — C61 Malignant neoplasm of prostate: Secondary | ICD-10-CM

## 2020-06-19 DIAGNOSIS — Z7189 Other specified counseling: Secondary | ICD-10-CM

## 2020-06-19 DIAGNOSIS — R319 Hematuria, unspecified: Secondary | ICD-10-CM

## 2020-06-19 LAB — CBC WITH DIFFERENTIAL/PLATELET
Abs Immature Granulocytes: 0.37 10*3/uL — ABNORMAL HIGH (ref 0.00–0.07)
Basophils Absolute: 0 10*3/uL (ref 0.0–0.1)
Basophils Relative: 1 %
Eosinophils Absolute: 0 10*3/uL (ref 0.0–0.5)
Eosinophils Relative: 0 %
HCT: 27 % — ABNORMAL LOW (ref 39.0–52.0)
Hemoglobin: 9.2 g/dL — ABNORMAL LOW (ref 13.0–17.0)
Immature Granulocytes: 4 %
Lymphocytes Relative: 17 %
Lymphs Abs: 1.5 10*3/uL (ref 0.7–4.0)
MCH: 29.5 pg (ref 26.0–34.0)
MCHC: 34.1 g/dL (ref 30.0–36.0)
MCV: 86.5 fL (ref 80.0–100.0)
Monocytes Absolute: 0.4 10*3/uL (ref 0.1–1.0)
Monocytes Relative: 5 %
Neutro Abs: 6.5 10*3/uL (ref 1.7–7.7)
Neutrophils Relative %: 73 %
Platelets: 143 10*3/uL — ABNORMAL LOW (ref 150–400)
RBC: 3.12 MIL/uL — ABNORMAL LOW (ref 4.22–5.81)
RDW: 15.2 % (ref 11.5–15.5)
WBC: 8.8 10*3/uL (ref 4.0–10.5)
nRBC: 0.2 % (ref 0.0–0.2)

## 2020-06-19 LAB — COMPREHENSIVE METABOLIC PANEL
ALT: 17 U/L (ref 0–44)
AST: 59 U/L — ABNORMAL HIGH (ref 15–41)
Albumin: 2.9 g/dL — ABNORMAL LOW (ref 3.5–5.0)
Alkaline Phosphatase: 1779 U/L — ABNORMAL HIGH (ref 38–126)
Anion gap: 8 (ref 5–15)
BUN: 18 mg/dL (ref 8–23)
CO2: 24 mmol/L (ref 22–32)
Calcium: 8.2 mg/dL — ABNORMAL LOW (ref 8.9–10.3)
Chloride: 104 mmol/L (ref 98–111)
Creatinine, Ser: 0.71 mg/dL (ref 0.61–1.24)
GFR, Estimated: >60 mL/min (ref >60)
Glucose, Bld: 172 mg/dL — ABNORMAL HIGH (ref 70–99)
Potassium: 3.6 mmol/L (ref 3.5–5.1)
Sodium: 136 mmol/L (ref 135–145)
Total Bilirubin: 0.6 mg/dL (ref 0.3–1.2)
Total Protein: 6.4 g/dL — ABNORMAL LOW (ref 6.5–8.1)

## 2020-06-19 LAB — URINE CULTURE: Culture: NO GROWTH

## 2020-06-19 LAB — SURGICAL PATHOLOGY

## 2020-06-19 LAB — CK: Total CK: 368 U/L (ref 49–397)

## 2020-06-19 MED ORDER — SODIUM CHLORIDE 0.9 % IV SOLN: INTRAVENOUS | Status: DC | PRN

## 2020-06-19 MED ORDER — NICOTINE 21 MG/24HR TD PT24
21.0000 mg | MEDICATED_PATCH | Freq: Every day | TRANSDERMAL | Status: DC
  Administered 2020-06-19 – 2020-06-20 (×2): 21 mg via TRANSDERMAL
  Filled 2020-06-19 (×2): qty 1

## 2020-06-19 MED ORDER — SULFAMETHOXAZOLE-TRIMETHOPRIM 800-160 MG PO TABS
1.0000 | ORAL_TABLET | Freq: Two times a day (BID) | ORAL | Status: DC
  Administered 2020-06-19 – 2020-06-20 (×2): 1 via ORAL
  Filled 2020-06-19 (×4): qty 1

## 2020-06-19 MED ORDER — DEGARELIX ACETATE(240 MG DOSE) 120 MG/VIAL ~~LOC~~ SOLR
240.0000 mg | Freq: Once | SUBCUTANEOUS | Status: DC
  Filled 2020-06-19: qty 6

## 2020-06-19 MED ORDER — DEGARELIX ACETATE(240 MG DOSE) 120 MG/VIAL ~~LOC~~ SOLR
240.0000 mg | Freq: Once | SUBCUTANEOUS | Status: AC
Start: 2020-06-19 — End: 2020-06-19
  Administered 2020-06-19: 240 mg via SUBCUTANEOUS
  Filled 2020-06-19: qty 6

## 2020-06-19 NOTE — Progress Notes (Signed)
Orthostatic vitals  Laying 124/61 pulse 73 Sitting 110/71 pulse 78 Standing 116/66 pulse 71

## 2020-06-19 NOTE — Progress Notes (Addendum)
PROGRESS NOTE   Cody Vaughan  WJX:[id number] DOB: [birth year] DOA: 06/16/2020 PCP: Patient, No Pcp Per (Inactive)  Brief Narrative:   20 white male Prior epididymitis followed conservatively Hep C Previous substance abuse/EtOH/cocaine in the remote past Metastatic prostate CA PSA 3678 followed by Dr. Tasia Catchings oncology/[facility] neurology Underwent transrectal biopsy 6/15-developed hematuria, dark stool-near syncope with orthostasis without LOC-was hypotensive in ED Hemoglobin down from 13-->10 Creatinine elevated 0.7--->.05    Hospital-Problem based course  ?  Sepsis Spiked fever (despite being on ceftriaxone IV) 102.8, hypotensive in addition at this time-blood cultures pending from 4/17 a.m. Meropenem has been added at the recommendation of Dr. Diamantina Providence who will discuss with the family Initially received IV fluid boluses 4/18 Overall much improved Presyncope hypotension Combination of blood loss anemia Cut back IV fluid to 75 cc/H and repeat orthostatic vital signs every shift.today Prostatic biopsy 4/15 Probable metastatic prostate cancer still being worked up by Dr. 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  XRT this admission-defer to Dr. Tasia Catchings the patient's primary oncologist AKI on admission Resolving with fluids Recheck periodically labs Myalgias in both lower extremities and pain earlier in admission Retrospectively could this have been secondary to rhabdo???-I will obtain a nonemergent CK.today   DVT prophylaxis: SCD Code Status: Full Family Communication: d/w wife at bedside in great detail explained the process in addition to neck steps in care I have clarified with them  that I think that he is improved but we are not sure why patient had fevers-my concern initially was this of a superinfection however with the rising alk phos and diffuse mets I feel that this is probably secondary to his underlying prostate cancer causing the fever They understand and are appreciative of the great care given to them on the floor by nursing staff etc. Disposition:  Status is: Observation  The patient will require care spanning > 2 midnights and should be moved to inpatient because: Hemodynamically unstable, Persistent severe electrolyte disturbances, Altered mental status and Ongoing diagnostic testing needed not appropriate for outpatient work up  Dispo:  Patient From: Home  Planned Disposition: Home  Medically stable for discharge: No      Consultants:   None at this time  Procedures: Prostatic biopsy  Antimicrobials: No  Subjective:  Pain is resolving No overt fever today Ambulating well and pain in lower extremities and thighs is resolved Does not feel 100% is normal but feels much closer than he did yesterday  Objective: Vitals:   06/18/20 2242 06/18/20 2246 06/19/20 0525 06/19/20 0734  BP:  109/71 118/69 (!) 109/57  Pulse:  76 80 70  Resp:    17  Temp: 97.9 F (36.6 C)  (!) 97.3 F (36.3 C) 97.8 F (36.6 C)  TempSrc: Oral  Oral Oral  SpO2:  97% 95% 97%  Weight:   76.3 kg   Height:        Intake/Output Summary (Last 24 hours) at 06/19/2020 1136 Last data filed at 06/19/2020 1022 Gross per 24 hour  Intake 2467.86 ml  Output 1050 ml  Net 1417.86 ml   Filed Weights   06/16/20 1047 06/19/20 0525  Weight: 75.8 kg 76.3 kg    Examination:  eomi  ncat young looking male well built cta b s1 s 2no m abd soft Power 5/5 straight leg raise off of the bed intact Deferred reflexes   Data Reviewed: personally reviewed   Data BUNs/creatinine 14/1.0--> 12/0.7-->18/0.7 Alk phos 1081-->2435-->1779  Radiology Studies: DG Pelvis 1-2  Views  Result Date: 06/18/2020 CLINICAL DATA:  Fall on Friday. History of substance abuse. History of metastatic prostate cancer. EXAM: PELVIS - 1-2 VIEW COMPARISON:  None. FINDINGS: No evidence of acute osseous fracture or dislocation. Heterogeneous mineralization throughout the osseous pelvis, compatible with underlying osseous metastases, better demonstrated on earlier CT abdomen of 05/27/2020. Soft tissues about the pelvis are unremarkable. IMPRESSION: No acute findings. No evidence of acute osseous fracture or dislocation. Osseous metastases, better demonstrated on earlier CT abdomen of 05/27/2020. Electronically Signed   By: Franki Cabot M.D.   On: 06/18/2020 10:04   MR THORACIC SPINE W WO CONTRAST  Result Date: 06/18/2020 CLINICAL DATA:  Metastatic disease evaluation. Metastatic prostate cancer with back pain. EXAM: MRI THORACIC WITHOUT AND WITH CONTRAST TECHNIQUE: Multiplanar and multiecho pulse sequences of the thoracic spine were obtained without and with intravenous contrast. CONTRAST:  7.53m GADAVIST GADOBUTROL 1 MMOL/ML IV SOLN COMPARISON:  CT chest 06/14/2020 FINDINGS: Intermittent motion degradation. Alignment:  No significant spondylolisthesis. Vertebrae: Multilevel Schmorl nodes. Vertebral body height is otherwise maintained. There is diffuse heterogeneous signal abnormality and patchy corresponding enhancement throughout the visualized thoracic spine and posterior ribs compatible with diffuse osseous metastatic disease. No evidence of pathologic compression fracture. Cord: No spinal cord signal abnormality is identified. No definite dural-based metastatic disease is identified. Paraspinal and other soft tissues: No definite soft tissue extension of tumor is identified. Specifically, no paraspinal masses identified. Disc levels: Mild-to-moderate disc degeneration throughout the thoracic spine. Multilevel shallow disc bulges and facet arthrosis. No significant focal disc herniation, spinal  canal stenosis or neural foraminal narrowing. IMPRESSION: Diffuse osseous metastatic disease throughout the visualized thoracic spine and posterior ribs. No pathologic compression fracture. No definite dural-based tumor is identified within the thoracic spinal canal. No soft tissue extension of tumor is identified. Thoracic spondylosis as described without significant spinal canal stenosis or neural foraminal narrowing. Electronically Signed   By: KKellie SimmeringDO   On: 06/18/2020 16:06   MR Lumbar Spine W Wo Contrast  Result Date: 06/18/2020 CLINICAL DATA:  Metastatic disease evaluation. Metastatic prostate cancer. Back pain. EXAM: MRI LUMBAR SPINE WITHOUT AND WITH CONTRAST TECHNIQUE: Multiplanar and multiecho pulse sequences of the lumbar spine were obtained without and with intravenous contrast. CONTRAST:  7.546mGADAVIST GADOBUTROL 1 MMOL/ML IV SOLN COMPARISON:  CT abdomen/pelvis 05/27/2020. FINDINGS: Segmentation: 5 lumbar vertebrae. The caudal most well-formed intervertebral disc space is designated L5-S1. Alignment:  No significant spondylolisthesis. Vertebrae: Multilevel Schmorl nodes. Mild chronic L4 superior endplate compression deformity. Vertebral body height is otherwise maintained. No acute pathologic compression fracture. There is diffuse heterogeneous osseous signal abnormality and patchy enhancement throughout the visualized lumbar spine, sacrum and bilateral iliac bones compatible with diffuse osseous metastatic disease. Conus medullaris and cauda equina: Conus extends to the L2 level. No signal abnormality within the visualized distal spinal cord. No definite dural-based tumor is identified. Paraspinal and other soft tissues: No extraosseous tumor is identified. Specifically, no paraspinal soft tissue mass is appreciated. Disc levels: Multilevel disc degeneration. Most notably, there is moderate to moderately advanced disc degeneration at T12-L1, L1-L2 and L2-L3. T12-L1: Small disc bulge. Mild  facet arthrosis. Mild relative spinal canal narrowing. No significant foraminal stenosis. L1-L2: Disc bulge with endplate  spurring. Superimposed small left subarticular/foraminal disc protrusion. Mild facet arthrosis. Mild bilateral subarticular and central canal narrowing without appreciable nerve root impingement. Mild left neural foraminal narrowing. L2-L3: Disc bulge with endplate spurring. Moderate facet arthrosis with ligamentum flavum hypertrophy. Moderate subtle subarticular stenosis with potential to affect either descending L3 nerve root. Moderate central canal stenosis. Moderate bilateral neural foraminal narrowing. L3-L4: Disc bulge with endplate spurring. Superimposed left foraminal/extraforaminal disc protrusion at site of posterior annular fissure. Moderate facet arthrosis with slight ligamentum flavum hypertrophy. Mild bilateral subarticular narrowing without appreciable nerve root impingement. Central canal patent. Moderate left neural foraminal narrowing L4-L5: Disc bulge with superimposed broad-based shallow central disc protrusion. Facet arthrosis (mild right, moderate/advanced left). Ligamentum flavum hypertrophy. Bilateral subarticular narrowing (moderate right, mild left) with potential to affect the descending right L5 nerve root. Mild to moderate central canal narrowing. Moderate bilateral neural foraminal narrowing. L5-S1: Disc bulge with slight endplate spurring. Advanced facet arthrosis with ligamentum flavum hypertrophy. Prominence of the dorsal epidural fat. Mild bilateral subarticular narrowing without appreciable nerve root impingement. No significant foraminal stenosis. IMPRESSION: Findings compatible with diffuse osseous metastatic disease within the visualized lumbar spine, sacrum and bilateral iliac bones. No definite dural-based tumor is identified within the lumbar spinal canal. No extraosseous tumor extension is identified. Lumbar spondylosis as outlined. Canal stenosis is  greatest at L2-L3 (moderate) and L4-L5 (mild/moderate). Potential nerve root impingement within the spinal canal at L2-L3 and L4-L5. Sites of mild and moderate neural foraminal narrowing as detailed. Electronically Signed   By: Kellie Simmering DO   On: 06/18/2020 16:21   MR SACRUM SI JOINTS W WO CONTRAST  Result Date: 06/18/2020 CLINICAL DATA:  Metastatic prostate cancer EXAM: MRI PELVIS (SACRUM) WITHOUT AND WITH CONTRAST TECHNIQUE: Multiplanar multisequence MR imaging of the pelvis was performed both before and after administration of intravenous contrast. Sacral protocol utilized. CONTRAST:  7.12m GADAVIST GADOBUTROL 1 MMOL/ML IV SOLN COMPARISON:  CT pelvis 05/27/2020 FINDINGS: Osseous structures: Findings of diffuse metastatic disease throughout the visualized sacrum and in the adjacent iliac bones and ischia. There is some relative sparing of the coccyx. This manifests is diffusely low T2 signal in the involved regions, as well as patchy enhancement. This matches the diffuse sclerotic appearance with sparing of the coccyx seen on CT of 05/27/2020. In the sacrum, I do not observe compelling findings of superimposed fracture or substantial extraosseous extension. There is some anterior spurring along the SI joints. Degenerative facet arthropathy at L5-S1 noted. Sacroiliac joints: No erosion or effusion. Presacral soft tissues: There is moderate presacral edema without a discrete mass. Sacral plexus: No discrete impinging lesion is identified along the sacral plexus or sciatic notch. Surrounding tissues: Mild edema signal is present in the iliacus and gluteus medius and minimus musculature in a symmetric manner. There is also some low-level edema in the medial piriformis musculature bilaterally. Visualized portion of the rectum unremarkable. IMPRESSION: 1. Diffuse malignant involvement of the sacrum as well as the adjacent portions of the iliac bones and ischia. Relative sparing of the coccyx. No appreciable  fracture or impingement along the sacral plexus or sciatic notch region, although there is some regional edema tracking along adjacent musculature and in the presacral space as detailed above. Electronically Signed   By: WVan ClinesM.D.   On: 06/18/2020 15:50   UKoreaVenous Img Lower Bilateral (DVT)  Result Date: 06/18/2020 CLINICAL DATA:  Initial evaluation for acute pain for several weeks. EXAM: BILATERAL LOWER EXTREMITY VENOUS DOPPLER ULTRASOUND TECHNIQUE: Gray-scale sonography with graded  compression, as well as color Doppler and duplex ultrasound were performed to evaluate the lower extremity deep venous systems from the level of the common femoral vein and including the common femoral, femoral, profunda femoral, popliteal and calf veins including the posterior tibial, peroneal and gastrocnemius veins when visible. The superficial great saphenous vein was also interrogated. Spectral Doppler was utilized to evaluate flow at rest and with distal augmentation maneuvers in the common femoral, femoral and popliteal veins. COMPARISON:  None. FINDINGS: RIGHT LOWER EXTREMITY Common Femoral Vein: No evidence of thrombus. Normal compressibility, respiratory phasicity and response to augmentation. Saphenofemoral Junction: No evidence of thrombus. Normal compressibility and flow on color Doppler imaging. Profunda Femoral Vein: No evidence of thrombus. Normal compressibility and flow on color Doppler imaging. Femoral Vein: No evidence of thrombus. Normal compressibility, respiratory phasicity and response to augmentation. Popliteal Vein: No evidence of thrombus. Normal compressibility, respiratory phasicity and response to augmentation. Calf Veins: No evidence of thrombus. Normal compressibility and flow on color Doppler imaging. Superficial Great Saphenous Vein: No evidence of thrombus. Normal compressibility. Venous Reflux:  None. Other Findings:  None. LEFT LOWER EXTREMITY Common Femoral Vein: No evidence of  thrombus. Normal compressibility, respiratory phasicity and response to augmentation. Saphenofemoral Junction: No evidence of thrombus. Normal compressibility and flow on color Doppler imaging. Profunda Femoral Vein: No evidence of thrombus. Normal compressibility and flow on color Doppler imaging. Femoral Vein: No evidence of thrombus. Normal compressibility, respiratory phasicity and response to augmentation. Popliteal Vein: No evidence of thrombus. Normal compressibility, respiratory phasicity and response to augmentation. Calf Veins: No evidence of thrombus. Normal compressibility and flow on color Doppler imaging. Superficial Great Saphenous Vein: No evidence of thrombus. Normal compressibility. Venous Reflux:  None. Other Findings:  None. IMPRESSION: No evidence of deep venous thrombosis in either lower extremity. Electronically Signed   By: Jeannine Boga M.D.   On: 06/18/2020 19:01   Scheduled Meds: . feeding supplement  237 mL Oral TID BM  . ibuprofen  400 mg Oral QID  . multivitamin with minerals  1 tablet Oral Daily  . nicotine  14 mg Transdermal Daily   Continuous Infusions: . sodium chloride    . meropenem (MERREM) IV 1 g (06/19/20 0548)    LOS: 2 days   Time spent: [place], MD [facility] To contact the attending provider between 7A-7P or the covering provider during after hours 7P-7A, please log into the web site [email or web address] and access using universal [place] password for that web site. If you do not have the password, please call the hospital operator.  06/19/2020, 11:36 AM

## 2020-06-19 NOTE — Consult Note (Signed)
Hematology/Oncology Consult note [facility], [place] Telephone:([phone] [phone]  Patient Care Team: Patient, No Pcp Per (Inactive) as PCP - General (General Practice)   Name of the patient: Cody Vaughan  [id number]  01-31-1961   Date of visit: 06/19/20 REASON FOR COSULTATION:   History of presenting illness-  60 y.o. male presented to emergency room on 06/16/2020 for evaluation hematuria, hematochezia, near syncope and bilateral lower extremity pain, fatigue. Patient had prostate biopsy on 06/16/2020.  In emergency room, patient was hypotensive with MAP in the 50s, received IV fluid with improvement of his hypotension.  Acute blood loss anemia and hemoglobin dropped to 10.  Currently patient was admitted for hematuria, near-syncope, intractable pain 06/19/2018 MRI showed diffuse osseous metastatic disease throughout the visualized thoracic and lumbar spine and the posterior ribs.  No pathological compression fracture.  No definite invasive tumor is identified.  No stop tissue expansion of the tumor. Patient also spiked a fever despite being on ceftriaxone, antibiotics were switched to meropenem per recommendation of urology. Hemoglobin has been stable.  He is hospitalization.  Patient is on oxycodone 10 mg as needed and the pain is better controlled.  Oncology was consulted for further management. Patient feels much better today.  Blood pressure has improved.  Patient is afebrile.  Review of Systems  Constitutional: Positive for fatigue. Negative for appetite change, chills, fever and unexpected weight change.  HENT:   Negative for hearing loss and voice change.   Eyes: Negative for eye problems and icterus.  Respiratory: Negative for chest tightness, cough and shortness of breath.   Cardiovascular: Negative for chest pain and leg swelling.  Gastrointestinal: Negative for abdominal distention and abdominal pain.  Endocrine: Negative for hot flashes.   Genitourinary: Positive for hematuria. Negative for difficulty urinating, dysuria and frequency.   Musculoskeletal: Negative for arthralgias.       Lower extremity pain  Skin: Negative for itching and rash.  Neurological: Negative for light-headedness and numbness.  Hematological: Negative for adenopathy. Does not bruise/bleed easily.  Psychiatric/Behavioral: Negative for confusion.    No Known Allergies  Patient Active Problem List   Diagnosis Date Noted  . Pericardial effusion 06/18/2020  . Fall 06/17/2020  . Hematuria 06/17/2020  . Postural dizziness with presyncope 06/16/2020  . Prostate cancer ([place]) 06/14/2020  . Abnormal CT scan 06/07/2020  . Elevated alkaline phosphatase level 06/07/2020  . Goals of care, counseling/discussion 06/07/2020  . Bone lesion 06/07/2020  . Epididymo-orchitis with abscess 09/16/2012  . Febrile urinary tract infection 09/16/2012  . Scrotal wall abscess 09/16/2012     Past Medical History:  Diagnosis Date  . Prostate cancer ([place])    2022     Past Surgical History:  Procedure Laterality Date  . PROSTATE BIOPSY N/A 06/16/2020   Procedure: PROSTATE BIOPSY;  Surgeon: Billey Co, MD;  Location: [facility] ORS;  Service: Urology;  Laterality: N/A;  . SHOULDER SURGERY     1985  . TRANSRECTAL ULTRASOUND N/A 06/16/2020   Procedure: TRANSRECTAL ULTRASOUND;  Surgeon: Billey Co, MD;  Location: [facility] ORS;  Service: Urology;  Laterality: N/A;    Social History   Socioeconomic History  . Marital status: Married    Spouse name: Not on file  . Number of children: Not on file  . Years of education: Not on file  . Highest education level: Not on file  Occupational History  . 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  Smoking status: Current Every Day Smoker    Packs/day: 1.00  . Smokeless tobacco: Never Used  Substance and Sexual Activity  . Alcohol use: Not Currently  . Drug use: Never  .  Sexual activity: Not Currently  Other Topics Concern  . Not on file  Social History Narrative  . Not on file   Social Determinants of Health   Financial Resource Strain: Not on file  Food Insecurity: Not on file  Transportation Needs: Not on file  Physical Activity: Not on file  Stress: Not on file  Social Connections: Not on file  Intimate Partner Violence: Not on file     Family History  Problem Relation Age of Onset  . COPD Mother   . Heart attack Father      Current Facility-Administered Medications:  .  0.9 %  sodium chloride infusion, , Intravenous, PRN, Verlon Au, Jai-Gurmukh, MD .  acetaminophen (TYLENOL) tablet 650 mg, 650 mg, Oral, Q6H PRN, Nita Sells, MD, 650 mg at 06/18/20 1629 .  degarelix [facility]) injection 240 mg, 240 mg, Subcutaneous, Once, Earlie Server, MD .  feeding supplement (ENSURE ENLIVE / ENSURE PLUS) liquid 237 mL, 237 mL, Oral, TID BM, Samtani, Jai-Gurmukh, MD, 237 mL at 06/18/20 1601 .  ibuprofen (ADVIL) tablet 400 mg, 400 mg, Oral, QID, Samtani, Jai-Gurmukh, MD, 400 mg at 06/19/20 1248 .  meropenem (MERREM) 1 g in sodium chloride 0.9 % 100 mL IVPB, 1 g, Intravenous, Q8H, Samtani, Jai-Gurmukh, MD, Last Rate: 200 mL/hr at 06/19/20 1250, 1 g at 06/19/20 1250 .  morphine 2 MG/ML injection 2 mg, 2 mg, Intravenous, Q4H PRN, Nita Sells, MD, 2 mg at 06/18/20 0222 .  multivitamin with minerals tablet 1 tablet, 1 tablet, Oral, Daily, Nita Sells, MD, 1 tablet at 06/19/20 2836 .  nicotine (NICODERM CQ - dosed in mg/24 hours) patch 14 mg, 14 mg, Transdermal, Daily, Verlon Au, Jai-Gurmukh, MD, 14 mg at 06/19/20 0821 .  ondansetron (ZOFRAN) injection 4 mg, 4 mg, Intravenous, Q6H PRN, Samtani, Jai-Gurmukh, MD .  oxyCODONE (Oxy IR/ROXICODONE) immediate release tablet 10 mg, 10 mg, Oral, Q3H PRN, Nita Sells, MD, 10 mg at 06/18/20 0941 .  traZODone (DESYREL) tablet 50 mg, 50 mg, Oral, QHS PRN, Nita Sells, MD, 50 mg at 06/17/20  2052   Physical exam:  Vitals:   06/19/20 1217 06/19/20 1220 06/19/20 1225 06/19/20 1227  BP: 124/61 110/71 125/65 116/66  Pulse: 73 78 83 71  Resp: 18     Temp: 97.7 F (36.5 C)     TempSrc: Oral     SpO2: 98%     Weight:      Height:       Physical Exam      CMP Latest Ref Rng & Units 06/19/2020  Glucose 70 - 99 mg/dL 172(H)  BUN 8 - 23 mg/dL 18  Creatinine 0.61 - 1.24 mg/dL 0.71  Sodium 135 - 145 mmol/L 136  Potassium 3.5 - 5.1 mmol/L 3.6  Chloride 98 - 111 mmol/L 104  CO2 22 - 32 mmol/L 24  Calcium 8.9 - 10.3 mg/dL 8.2(L)  Total Protein 6.5 - 8.1 g/dL 6.4(L)  Total Bilirubin 0.3 - 1.2 mg/dL 0.6  Alkaline Phos 38 - 126 U/L 1,779(H)  AST 15 - 41 U/L 59(H)  ALT 0 - 44 U/L 17   CBC Latest Ref Rng & Units 06/19/2020  WBC 4.0 - 10.5 K/uL 8.8  Hemoglobin 13.0 - 17.0 g/dL 9.2(L)  Hematocrit 39.0 - 52.0 % 27.0(L)  Platelets 150 -  400 K/uL 143(L)    RADIOGRAPHIC STUDIES: I have personally reviewed the radiological images as listed and agreed with the findings in the report. DG Pelvis 1-2 Views  Result Date: 06/18/2020 CLINICAL DATA:  Fall on Friday. History of substance abuse. History of metastatic prostate cancer. EXAM: PELVIS - 1-2 VIEW COMPARISON:  None. FINDINGS: No evidence of acute osseous fracture or dislocation. Heterogeneous mineralization throughout the osseous pelvis, compatible with underlying osseous metastases, better demonstrated on earlier CT abdomen of 05/27/2020. Soft tissues about the pelvis are unremarkable. IMPRESSION: No acute findings. No evidence of acute osseous fracture or dislocation. Osseous metastases, better demonstrated on earlier CT abdomen of 05/27/2020. Electronically Signed   By: Franki Cabot M.D.   On: 06/18/2020 10:04   CT CHEST W CONTRAST  Result Date: 06/16/2020 CLINICAL DATA:  Bone pain with weakness and weight loss for several weeks. Sclerotic osseous lesions on abdominopelvic CT suspicious for metastatic disease. EXAM: CT CHEST  WITH CONTRAST TECHNIQUE: Multidetector CT imaging of the chest was performed during intravenous contrast administration. CONTRAST:  76mL OMNIPAQUE IOHEXOL 300 MG/ML  SOLN COMPARISON:  Abdominopelvic CT 05/27/2020. One view chest 05/27/2020. FINDINGS: Cardiovascular: Atherosclerosis of the aorta, great vessels and coronary arteries with mild intimal irregularity of the aortic arch. The ascending aorta is mildly dilated, having a maximal diameter 4.1 cm. No acute vascular findings are evident. The heart size is normal. Trace pericardial fluid. Mediastinum/Nodes: There are no enlarged mediastinal, hilar or axillary lymph nodes. The thyroid gland, trachea and esophagus demonstrate no significant findings. Lungs/Pleura: No pleural effusion or pneumothorax. Mild centrilobular and paraseptal emphysema with scattered parenchymal scarring. No suspicious pulmonary nodularity. Upper abdomen: The visualized upper abdomen appears stable without significant findings. The gallbladder is contracted. Musculoskeletal/Chest wall: Diffuse ill-defined sclerosis noted throughout the thoracic spine, sternum, ribs and scapula suspicious for blastic metastatic disease. Scattered Schmorl's node formation in the spine without evidence of acute fracture or epidural tumor. IMPRESSION: 1. Widespread sclerosis throughout the bones of the chest suspicious for osseous metastatic disease. 2. No evidence of extra osseous thoracic metastases or primary neoplasm. 3. Aortic Atherosclerosis (ICD10-I70.0). Mild dilatation of the ascending aorta. Recommend annual imaging followup by CTA or MRA. This recommendation follows 2010 ACCF/AHA/AATS/ACR/ASA/SCA/SCAI/SIR/STS/SVM Guidelines for the Diagnosis and Management of Patients with Thoracic Aortic Disease. Circulation. 2010; 121: D532-D924 Electronically Signed   By: Richardean Sale M.D.   On: 06/16/2020 08:57   MR THORACIC SPINE W WO CONTRAST  Result Date: 06/18/2020 CLINICAL DATA:  Metastatic disease  evaluation. Metastatic prostate cancer with back pain. EXAM: MRI THORACIC WITHOUT AND WITH CONTRAST TECHNIQUE: Multiplanar and multiecho pulse sequences of the thoracic spine were obtained without and with intravenous contrast. CONTRAST:  7.37mL GADAVIST GADOBUTROL 1 MMOL/ML IV SOLN COMPARISON:  CT chest 06/14/2020 FINDINGS: Intermittent motion degradation. Alignment:  No significant spondylolisthesis. Vertebrae: Multilevel Schmorl nodes. Vertebral body height is otherwise maintained. There is diffuse heterogeneous signal abnormality and patchy corresponding enhancement throughout the visualized thoracic spine and posterior ribs compatible with diffuse osseous metastatic disease. No evidence of pathologic compression fracture. Cord: No spinal cord signal abnormality is identified. No definite dural-based metastatic disease is identified. Paraspinal and other soft tissues: No definite soft tissue extension of tumor is identified. Specifically, no paraspinal masses identified. Disc levels: Mild-to-moderate disc degeneration throughout the thoracic spine. Multilevel shallow disc bulges and facet arthrosis. No significant focal disc herniation, spinal canal stenosis or neural foraminal narrowing. IMPRESSION: Diffuse osseous metastatic disease throughout the visualized thoracic spine and posterior ribs. No pathologic compression  fracture. No definite dural-based tumor is identified within the thoracic spinal canal. No soft tissue extension of tumor is identified. Thoracic spondylosis as described without significant spinal canal stenosis or neural foraminal narrowing. Electronically Signed   By: Kellie Simmering DO   On: 06/18/2020 16:06   MR Lumbar Spine W Wo Contrast  Result Date: 06/18/2020 CLINICAL DATA:  Metastatic disease evaluation. Metastatic prostate cancer. Back pain. EXAM: MRI LUMBAR SPINE WITHOUT AND WITH CONTRAST TECHNIQUE: Multiplanar and multiecho pulse sequences of the lumbar spine were obtained without  and with intravenous contrast. CONTRAST:  7.73mL GADAVIST GADOBUTROL 1 MMOL/ML IV SOLN COMPARISON:  CT abdomen/pelvis 05/27/2020. FINDINGS: Segmentation: 5 lumbar vertebrae. The caudal most well-formed intervertebral disc space is designated L5-S1. Alignment:  No significant spondylolisthesis. Vertebrae: Multilevel Schmorl nodes. Mild chronic L4 superior endplate compression deformity. Vertebral body height is otherwise maintained. No acute pathologic compression fracture. There is diffuse heterogeneous osseous signal abnormality and patchy enhancement throughout the visualized lumbar spine, sacrum and bilateral iliac bones compatible with diffuse osseous metastatic disease. Conus medullaris and cauda equina: Conus extends to the L2 level. No signal abnormality within the visualized distal spinal cord. No definite dural-based tumor is identified. Paraspinal and other soft tissues: No extraosseous tumor is identified. Specifically, no paraspinal soft tissue mass is appreciated. Disc levels: Multilevel disc degeneration. Most notably, there is moderate to moderately advanced disc degeneration at T12-L1, L1-L2 and L2-L3. T12-L1: Small disc bulge. Mild facet arthrosis. Mild relative spinal canal narrowing. No significant foraminal stenosis. L1-L2: Disc bulge with endplate spurring. Superimposed small left subarticular/foraminal disc protrusion. Mild facet arthrosis. Mild bilateral subarticular and central canal narrowing without appreciable nerve root impingement. Mild left neural foraminal narrowing. L2-L3: Disc bulge with endplate spurring. Moderate facet arthrosis with ligamentum flavum hypertrophy. Moderate subtle subarticular stenosis with potential to affect either descending L3 nerve root. Moderate central canal stenosis. Moderate bilateral neural foraminal narrowing. L3-L4: Disc bulge with endplate spurring. Superimposed left foraminal/extraforaminal disc protrusion at site of posterior annular fissure. Moderate  facet arthrosis with slight ligamentum flavum hypertrophy. Mild bilateral subarticular narrowing without appreciable nerve root impingement. Central canal patent. Moderate left neural foraminal narrowing L4-L5: Disc bulge with superimposed broad-based shallow central disc protrusion. Facet arthrosis (mild right, moderate/advanced left). Ligamentum flavum hypertrophy. Bilateral subarticular narrowing (moderate right, mild left) with potential to affect the descending right L5 nerve root. Mild to moderate central canal narrowing. Moderate bilateral neural foraminal narrowing. L5-S1: Disc bulge with slight endplate spurring. Advanced facet arthrosis with ligamentum flavum hypertrophy. Prominence of the dorsal epidural fat. Mild bilateral subarticular narrowing without appreciable nerve root impingement. No significant foraminal stenosis. IMPRESSION: Findings compatible with diffuse osseous metastatic disease within the visualized lumbar spine, sacrum and bilateral iliac bones. No definite dural-based tumor is identified within the lumbar spinal canal. No extraosseous tumor extension is identified. Lumbar spondylosis as outlined. Canal stenosis is greatest at L2-L3 (moderate) and L4-L5 (mild/moderate). Potential nerve root impingement within the spinal canal at L2-L3 and L4-L5. Sites of mild and moderate neural foraminal narrowing as detailed. Electronically Signed   By: Kellie Simmering DO   On: 06/18/2020 16:21   [place] Transrectal Complete  Result Date: 06/16/2020 Please see Notes tab for imaging impression.  CT ABDOMEN PELVIS W CONTRAST  Result Date: 05/27/2020 CLINICAL DATA:  Right upper quadrant abdominal pain. Elevated alkaline phosphatase EXAM: CT ABDOMEN AND PELVIS WITH CONTRAST TECHNIQUE: Multidetector CT imaging of the abdomen and pelvis was performed using the standard protocol following bolus administration of intravenous contrast. CONTRAST:  130mL OMNIPAQUE IOHEXOL 300 MG/ML  SOLN COMPARISON:  None.  FINDINGS: Lower chest: Bibasilar subsegmental atelectasis. Heart size within normal limits. Hepatobiliary: No focal liver abnormality is seen. No gallstones, gallbladder wall thickening, or biliary dilatation. Pancreas: Unremarkable. No pancreatic ductal dilatation or surrounding inflammatory changes. Spleen: Normal in size without focal abnormality. Adrenals/Urinary Tract: Unremarkable adrenal glands. Area of cortical scarring within the midpole of the right kidney. Kidneys have otherwise symmetric enhancement. No renal lesion, stone, or hydronephrosis. Urinary bladder is within normal limits. Stomach/Bowel: Stomach appears within normal limits. No dilated loops of small bowel. Slightly hyperenhancing appendix with periappendiceal fat stranding (series 2, images 39-46). No focal colonic wall thickening. Vascular/Lymphatic: Fusiform infrarenal abdominal aortic aneurysm measuring 3.5 cm in maximal AP dimension. Extensive calcified and noncalcified atherosclerotic plaque throughout the aorta. No abdominopelvic lymphadenopathy is identified. Reproductive: Heterogeneous although nonenlarged prostate gland. Other: No free fluid. No abdominopelvic fluid collection. No pneumoperitoneum. No abdominal wall hernia. Musculoskeletal: Diffusely patchy sclerotic appearance of the osseous structures. Mild superior endplate compression deformity of L4 with less than 10% vertebral body height loss, age indeterminate. IMPRESSION: 1. Findings suspicious for early acute uncomplicated appendicitis. 2. Diffusely patchy sclerotic appearance of the osseous structures suspicious for osseous metastatic disease. Given patient demographics, metastatic prostate cancer could be potential etiology. Prostate gland is heterogeneous in appearance. Correlate with serum PSA. 3. Mild superior endplate compression deformity of L4 with less than 10% vertebral body height loss, age indeterminate. Correlate for point tenderness. 4. Fusiform infrarenal  abdominal aortic aneurysm measuring up to 3.5 cm. Recommend follow-up ultrasound every 2 years. This recommendation follows ACR consensus guidelines: White Paper of the ACR Incidental Findings Committee II on Vascular Findings. J Am Coll Radiol 2013; 10:789-794. Aortic Atherosclerosis (ICD10-I70.0). Electronically Signed   By: Davina Poke D.O.   On: 05/27/2020 12:58   MR SACRUM SI JOINTS W WO CONTRAST  Result Date: 06/18/2020 CLINICAL DATA:  Metastatic prostate cancer EXAM: MRI PELVIS (SACRUM) WITHOUT AND WITH CONTRAST TECHNIQUE: Multiplanar multisequence MR imaging of the pelvis was performed both before and after administration of intravenous contrast. Sacral protocol utilized. CONTRAST:  7.6mL GADAVIST GADOBUTROL 1 MMOL/ML IV SOLN COMPARISON:  CT pelvis 05/27/2020 FINDINGS: Osseous structures: Findings of diffuse metastatic disease throughout the visualized sacrum and in the adjacent iliac bones and ischia. There is some relative sparing of the coccyx. This manifests is diffusely low T2 signal in the involved regions, as well as patchy enhancement. This matches the diffuse sclerotic appearance with sparing of the coccyx seen on CT of 05/27/2020. In the sacrum, I do not observe compelling findings of superimposed fracture or substantial extraosseous extension. There is some anterior spurring along the SI joints. Degenerative facet arthropathy at L5-S1 noted. Sacroiliac joints: No erosion or effusion. Presacral soft tissues: There is moderate presacral edema without a discrete mass. Sacral plexus: No discrete impinging lesion is identified along the sacral plexus or sciatic notch. Surrounding tissues: Mild edema signal is present in the iliacus and gluteus medius and minimus musculature in a symmetric manner. There is also some low-level edema in the medial piriformis musculature bilaterally. Visualized portion of the rectum unremarkable. IMPRESSION: 1. Diffuse malignant involvement of the sacrum as well  as the adjacent portions of the iliac bones and ischia. Relative sparing of the coccyx. No appreciable fracture or impingement along the sacral plexus or sciatic notch region, although there is some regional edema tracking along adjacent musculature and in the presacral space as detailed above. Electronically Signed   By: Thayer Jew  Janeece Fitting M.D.   On: 06/18/2020 15:50   US Venous Img Lower Bilateral (DVT)  Result Date: 06/18/2020 CLINICAL DATA:  Initial evaluation for acute pain for several weeks. EXAM: BILATERAL LOWER EXTREMITY VENOUS DOPPLER ULTRASOUND TECHNIQUE: Gray-scale sonography with graded compression, as well as color Doppler and duplex ultrasound were performed to evaluate the lower extremity deep venous systems from the level of the common femoral vein and including the common femoral, femoral, profunda femoral, popliteal and calf veins including the posterior tibial, peroneal and gastrocnemius veins when visible. The superficial great saphenous vein was also interrogated. Spectral Doppler was utilized to evaluate flow at rest and with distal augmentation maneuvers in the common femoral, femoral and popliteal veins. COMPARISON:  None. FINDINGS: RIGHT LOWER EXTREMITY Common Femoral Vein: No evidence of thrombus. Normal compressibility, respiratory phasicity and response to augmentation. Saphenofemoral Junction: No evidence of thrombus. Normal compressibility and flow on color Doppler imaging. Profunda Femoral Vein: No evidence of thrombus. Normal compressibility and flow on color Doppler imaging. Femoral Vein: No evidence of thrombus. Normal compressibility, respiratory phasicity and response to augmentation. Popliteal Vein: No evidence of thrombus. Normal compressibility, respiratory phasicity and response to augmentation. Calf Veins: No evidence of thrombus. Normal compressibility and flow on color Doppler imaging. Superficial Great Saphenous Vein: No evidence of thrombus. Normal compressibility.  Venous Reflux:  None. Other Findings:  None. LEFT LOWER EXTREMITY Common Femoral Vein: No evidence of thrombus. Normal compressibility, respiratory phasicity and response to augmentation. Saphenofemoral Junction: No evidence of thrombus. Normal compressibility and flow on color Doppler imaging. Profunda Femoral Vein: No evidence of thrombus. Normal compressibility and flow on color Doppler imaging. Femoral Vein: No evidence of thrombus. Normal compressibility, respiratory phasicity and response to augmentation. Popliteal Vein: No evidence of thrombus. Normal compressibility, respiratory phasicity and response to augmentation. Calf Veins: No evidence of thrombus. Normal compressibility and flow on color Doppler imaging. Superficial Great Saphenous Vein: No evidence of thrombus. Normal compressibility. Venous Reflux:  None. Other Findings:  None. IMPRESSION: No evidence of deep venous thrombosis in either lower extremity. Electronically Signed   By: Jeannine Boga M.D.   On: 06/18/2020 19:01   DG Chest Portable 1 View  Result Date: 05/27/2020 CLINICAL DATA:  Fever and myalgia EXAM: PORTABLE CHEST 1 VIEW COMPARISON:  None. FINDINGS: Normal heart size and mediastinal contours. No acute infiltrate or edema. No effusion or pneumothorax. No acute osseous findings. Remote left distal clavicle fracture with hypertrophic attempted healing. IMPRESSION: Negative for pneumonia. Electronically Signed   By: Monte Fantasia M.D.   On: 05/27/2020 09:52   [place] PROSTATE BIOPSY MULTIPLE  Result Date: 06/16/2020 Please see Notes tab for imaging impression.   Assessment and plan-   #Acute hematuria/ anemia due to acute blood loss, secondary to recent prostate biopsy Hemoglobin has been stable.  Hematuria is stable.  #Sepsis, patient was treated with IV antibiotics.  Plan is to switch to Bactrim and finished 1 week course. 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 I communicated with pathology  Dr.Olney.  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  Awaiting official report. Discussed with patient about the diagnosis.  He understands that this condition is not curable but potentially treatable. Discussed with patient about the rationale and potential side effects of androgen deprivation therapy.  He agrees.  I will start patient on Firmagon 240 loading dose today.  Anticipate that his pain will be better controlled with pain medication and angina deprivation therapy pain.  We also discussed about adding outpatient chemotherapy or  oral agent.  We will further discuss with him outpatient. bone scan has been scheduled outpatient later this week. We will coordinate patient to follow-up with radiation oncology for palliative radiation.     Thank you for allowing me to participate in the care of this patient.     Earlie Server, MD, PhD Hematology Oncology [facility] Pager- [phone] 06/19/2020

## 2020-06-19 NOTE — Consult Note (Signed)
[place]  Telephone:([phone] [phone]   Name: Cody Vaughan Date: 06/19/2020 MRN: [id number]  DOB: 1960-05-19  Patient Care Team: Patient, No Pcp Per (Inactive) as PCP - General (General Practice)    REASON FOR CONSULTATION: JERIN FRANZEL is a 60 y.o. 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  Patient was admitted to hospital 06/16/2020 with presyncope with hematuria, and hematochezia following his prostate biopsy.  He has been treated for presumed sepsis.  Patient was referred to palliative care to help address goals and manage ongoing symptoms.  SOCIAL HISTORY:     reports that he has been smoking. He has been smoking about 1.00 pack per day. He has never used smokeless tobacco. He reports previous alcohol use. He reports that he does not use drugs.  Patient is married and lives at home with his wife.  He has two sons.  One son lives in [place] and the other in [place].  Patient owns a lawn care business.  ADVANCE DIRECTIVES:  Not on file  CODE STATUS: Full code  PAST MEDICAL HISTORY: Past Medical History:  Diagnosis Date  . Prostate cancer ([place])    2022    PAST SURGICAL HISTORY:  Past Surgical History:  Procedure Laterality Date  . PROSTATE BIOPSY N/A 06/16/2020   Procedure: PROSTATE BIOPSY;  Surgeon: Billey Co, MD;  Location: [facility] ORS;  Service: Urology;  Laterality: N/A;  . SHOULDER SURGERY     1985  . TRANSRECTAL ULTRASOUND N/A 06/16/2020   Procedure: TRANSRECTAL ULTRASOUND;  Surgeon: Billey Co, MD;  Location: [facility] ORS;  Service: Urology;  Laterality: N/A;    HEMATOLOGY/ONCOLOGY HISTORY:  Oncology History   No history exists.    ALLERGIES:  has No Known Allergies.  MEDICATIONS:  Current Facility-Administered Medications  Medication Dose Route Frequency  Provider Last Rate Last Admin  . 0.9 %  sodium chloride infusion   Intravenous PRN Nita Sells, MD      . acetaminophen (TYLENOL) tablet 650 mg  650 mg Oral Q6H PRN Nita Sells, MD   650 mg at 06/18/20 1629  . degarelix [facility]) injection 240 mg  240 mg Subcutaneous Once Dorothe Pea, RPH      . feeding supplement (ENSURE ENLIVE / ENSURE PLUS) liquid 237 mL  237 mL Oral TID BM Nita Sells, MD   237 mL at 06/18/20 1601  . ibuprofen (ADVIL) tablet 400 mg  400 mg Oral QID Nita Sells, MD   400 mg at 06/19/20 1248  . morphine 2 MG/ML injection 2 mg  2 mg Intravenous Q4H PRN Nita Sells, MD   2 mg at 06/18/20 0222  . multivitamin with minerals tablet 1 tablet  1 tablet Oral Daily Nita Sells, MD   1 tablet at 06/19/20 0355  . nicotine (NICODERM CQ - dosed in mg/24 hours) patch 14 mg  14 mg Transdermal Daily Nita Sells, MD   14 mg at 06/19/20 0821  . ondansetron (ZOFRAN) injection 4 mg  4 mg Intravenous Q6H PRN Nita Sells, MD      . oxyCODONE (Oxy IR/ROXICODONE) immediate release tablet 10 mg  10 mg Oral Q3H PRN Nita Sells, MD   10 mg at 06/18/20 0941  . sulfamethoxazole-trimethoprim (BACTRIM DS) 800-160 MG per tablet 1 tablet  1 tablet Oral Q12H Nita Sells, MD      . traZODone (  DESYREL) tablet 50 mg  50 mg Oral QHS PRN Nita Sells, MD   50 mg at 06/17/20 2052    VITAL SIGNS: BP 113/68 (BP Location: Left Arm)   Pulse 82   Temp 98.4 F (36.9 C) (Oral)   Resp 18   Ht _0  (1.803 m)   Wt 168 lb 3.4 oz (76.3 kg)   SpO2 98%   BMI 23.46 kg/m  Filed Weights   06/16/20 1047 06/19/20 0525  Weight: 167 lb (75.8 kg) 168 lb 3.4 oz (76.3 kg)    Estimated body mass index is 23.46 kg/m as calculated from the following:   Height as of this encounter: _1  (1.803 m).   Weight as of this encounter: 168 lb 3.4 oz (76.3 kg).  LABS: CBC:    Component Value Date/Time   WBC 8.8 06/19/2020 0901    HGB 9.2 (L) 06/19/2020 0901   HGB 12.6 (L) 09/07/2012 0431   HCT 27.0 (L) 06/19/2020 0901   HCT 36.5 (L) 09/07/2012 0431   PLT 143 (L) 06/19/2020 0901   PLT 288 09/07/2012 0431   MCV 86.5 06/19/2020 0901   MCV 89 09/07/2012 0431   NEUTROABS 6.5 06/19/2020 0901   NEUTROABS 6.2 09/07/2012 0431   LYMPHSABS 1.5 06/19/2020 0901   LYMPHSABS 1.8 09/07/2012 0431   MONOABS 0.4 06/19/2020 0901   MONOABS 1.6 (H) 09/07/2012 0431   EOSABS 0.0 06/19/2020 0901   EOSABS 0.3 09/07/2012 0431   BASOSABS 0.0 06/19/2020 0901   BASOSABS 0.1 09/07/2012 0431   Comprehensive Metabolic Panel:    Component Value Date/Time   NA 136 06/19/2020 0901   NA 142 09/06/2012 0429   K 3.6 06/19/2020 0901   K 3.5 09/06/2012 0429   CL 104 06/19/2020 0901   CL 106 09/06/2012 0429   CO2 24 06/19/2020 0901   CO2 28 09/06/2012 0429   BUN 18 06/19/2020 0901   BUN 9 09/06/2012 0429   CREATININE 0.71 06/19/2020 0901   CREATININE 0.97 09/06/2012 0429   GLUCOSE 172 (H) 06/19/2020 0901   GLUCOSE 105 (H) 09/06/2012 0429   CALCIUM 8.2 (L) 06/19/2020 0901   CALCIUM 8.5 09/06/2012 0429   AST 59 (H) 06/19/2020 0901   AST 20 09/02/2012 0658   ALT 17 06/19/2020 0901   ALT 43 09/02/2012 0658   ALKPHOS 1,779 (H) 06/19/2020 0901   ALKPHOS 71 09/02/2012 0658   BILITOT 0.6 06/19/2020 0901   BILITOT 1.0 09/02/2012 0658   PROT 6.4 (L) 06/19/2020 0901   PROT 5.9 (L) 09/02/2012 0658   ALBUMIN 2.9 (L) 06/19/2020 0901   ALBUMIN 2.6 (L) 09/02/2012 0658    RADIOGRAPHIC STUDIES: DG Pelvis 1-2 Views  Result Date: 06/18/2020 CLINICAL DATA:  Fall on Friday. History of substance abuse. History of metastatic prostate cancer. EXAM: PELVIS - 1-2 VIEW COMPARISON:  None. FINDINGS: No evidence of acute osseous fracture or dislocation. Heterogeneous mineralization throughout the osseous pelvis, compatible with underlying osseous metastases, better demonstrated on earlier CT abdomen of 05/27/2020. Soft tissues about the pelvis are  unremarkable. IMPRESSION: No acute findings. No evidence of acute osseous fracture or dislocation. Osseous metastases, better demonstrated on earlier CT abdomen of 05/27/2020. Electronically Signed   By: Franki Cabot M.D.   On: 06/18/2020 10:04   CT CHEST W CONTRAST  Result Date: 06/16/2020 CLINICAL DATA:  Bone pain with weakness and weight loss for several weeks. Sclerotic osseous lesions on abdominopelvic CT suspicious for metastatic disease. EXAM: CT CHEST WITH CONTRAST TECHNIQUE: Multidetector CT imaging  of the chest was performed during intravenous contrast administration. CONTRAST:  39m OMNIPAQUE IOHEXOL 300 MG/ML  SOLN COMPARISON:  Abdominopelvic CT 05/27/2020. One view chest 05/27/2020. FINDINGS: Cardiovascular: Atherosclerosis of the aorta, great vessels and coronary arteries with mild intimal irregularity of the aortic arch. The ascending aorta is mildly dilated, having a maximal diameter 4.1 cm. No acute vascular findings are evident. The heart size is normal. Trace pericardial fluid. Mediastinum/Nodes: There are no enlarged mediastinal, hilar or axillary lymph nodes. The thyroid gland, trachea and esophagus demonstrate no significant findings. Lungs/Pleura: No pleural effusion or pneumothorax. Mild centrilobular and paraseptal emphysema with scattered parenchymal scarring. No suspicious pulmonary nodularity. Upper abdomen: The visualized upper abdomen appears stable without significant findings. The gallbladder is contracted. Musculoskeletal/Chest wall: Diffuse ill-defined sclerosis noted throughout the thoracic spine, sternum, ribs and scapula suspicious for blastic metastatic disease. Scattered Schmorl's node formation in the spine without evidence of acute fracture or epidural tumor. IMPRESSION: 1. Widespread sclerosis throughout the bones of the chest suspicious for osseous metastatic disease. 2. No evidence of extra osseous thoracic metastases or primary neoplasm. 3. Aortic Atherosclerosis  (ICD10-I70.0). Mild dilatation of the ascending aorta. Recommend annual imaging followup by CTA or MRA. This recommendation follows 2010 ACCF/AHA/AATS/ACR/ASA/SCA/SCAI/SIR/STS/SVM Guidelines for the Diagnosis and Management of Patients with Thoracic Aortic Disease. Circulation. 2010; 121:: [id number] Signed   By: WRichardean SaleM.D.   On: 06/16/2020 08:57   MR THORACIC SPINE W WO CONTRAST  Result Date: 06/18/2020 CLINICAL DATA:  Metastatic disease evaluation. Metastatic prostate cancer with back pain. EXAM: MRI THORACIC WITHOUT AND WITH CONTRAST TECHNIQUE: Multiplanar and multiecho pulse sequences of the thoracic spine were obtained without and with intravenous contrast. CONTRAST:  7.579mGADAVIST GADOBUTROL 1 MMOL/ML IV SOLN COMPARISON:  CT chest 06/14/2020 FINDINGS: Intermittent motion degradation. Alignment:  No significant spondylolisthesis. Vertebrae: Multilevel Schmorl nodes. Vertebral body height is otherwise maintained. There is diffuse heterogeneous signal abnormality and patchy corresponding enhancement throughout the visualized thoracic spine and posterior ribs compatible with diffuse osseous metastatic disease. No evidence of pathologic compression fracture. Cord: No spinal cord signal abnormality is identified. No definite dural-based metastatic disease is identified. Paraspinal and other soft tissues: No definite soft tissue extension of tumor is identified. Specifically, no paraspinal masses identified. Disc levels: Mild-to-moderate disc degeneration throughout the thoracic spine. Multilevel shallow disc bulges and facet arthrosis. No significant focal disc herniation, spinal canal stenosis or neural foraminal narrowing. IMPRESSION: Diffuse osseous metastatic disease throughout the visualized thoracic spine and posterior ribs. No pathologic compression fracture. No definite dural-based tumor is identified within the thoracic spinal canal. No soft tissue extension of tumor is  identified. Thoracic spondylosis as described without significant spinal canal stenosis or neural foraminal narrowing. Electronically Signed   By: KyKellie SimmeringO   On: 06/18/2020 16:06   MR Lumbar Spine W Wo Contrast  Result Date: 06/18/2020 CLINICAL DATA:  Metastatic disease evaluation. Metastatic prostate cancer. Back pain. EXAM: MRI LUMBAR SPINE WITHOUT AND WITH CONTRAST TECHNIQUE: Multiplanar and multiecho pulse sequences of the lumbar spine were obtained without and with intravenous contrast. CONTRAST:  7.15m62mADAVIST GADOBUTROL 1 MMOL/ML IV SOLN COMPARISON:  CT abdomen/pelvis 05/27/2020. FINDINGS: Segmentation: 5 lumbar vertebrae. The caudal most well-formed intervertebral disc space is designated L5-S1. Alignment:  No significant spondylolisthesis. Vertebrae: Multilevel Schmorl nodes. Mild chronic L4 superior endplate compression deformity. Vertebral body height is otherwise maintained. No acute pathologic compression fracture. There is diffuse heterogeneous osseous signal abnormality and patchy enhancement throughout the visualized lumbar spine, sacrum and bilateral  iliac bones compatible with diffuse osseous metastatic disease. Conus medullaris and cauda equina: Conus extends to the L2 level. No signal abnormality within the visualized distal spinal cord. No definite dural-based tumor is identified. Paraspinal and other soft tissues: No extraosseous tumor is identified. Specifically, no paraspinal soft tissue mass is appreciated. Disc levels: Multilevel disc degeneration. Most notably, there is moderate to moderately advanced disc degeneration at T12-L1, L1-L2 and L2-L3. T12-L1: Small disc bulge. Mild facet arthrosis. Mild relative spinal canal narrowing. No significant foraminal stenosis. L1-L2: Disc bulge with endplate spurring. Superimposed small left subarticular/foraminal disc protrusion. Mild facet arthrosis. Mild bilateral subarticular and central canal narrowing without appreciable nerve root  impingement. Mild left neural foraminal narrowing. L2-L3: Disc bulge with endplate spurring. Moderate facet arthrosis with ligamentum flavum hypertrophy. Moderate subtle subarticular stenosis with potential to affect either descending L3 nerve root. Moderate central canal stenosis. Moderate bilateral neural foraminal narrowing. L3-L4: Disc bulge with endplate spurring. Superimposed left foraminal/extraforaminal disc protrusion at site of posterior annular fissure. Moderate facet arthrosis with slight ligamentum flavum hypertrophy. Mild bilateral subarticular narrowing without appreciable nerve root impingement. Central canal patent. Moderate left neural foraminal narrowing L4-L5: Disc bulge with superimposed broad-based shallow central disc protrusion. Facet arthrosis (mild right, moderate/advanced left). Ligamentum flavum hypertrophy. Bilateral subarticular narrowing (moderate right, mild left) with potential to affect the descending right L5 nerve root. Mild to moderate central canal narrowing. Moderate bilateral neural foraminal narrowing. L5-S1: Disc bulge with slight endplate spurring. Advanced facet arthrosis with ligamentum flavum hypertrophy. Prominence of the dorsal epidural fat. Mild bilateral subarticular narrowing without appreciable nerve root impingement. No significant foraminal stenosis. IMPRESSION: Findings compatible with diffuse osseous metastatic disease within the visualized lumbar spine, sacrum and bilateral iliac bones. No definite dural-based tumor is identified within the lumbar spinal canal. No extraosseous tumor extension is identified. Lumbar spondylosis as outlined. Canal stenosis is greatest at L2-L3 (moderate) and L4-L5 (mild/moderate). Potential nerve root impingement within the spinal canal at L2-L3 and L4-L5. Sites of mild and moderate neural foraminal narrowing as detailed. Electronically Signed   By: Kellie Simmering DO   On: 06/18/2020 16:21   [place] Transrectal Complete  Result  Date: 06/16/2020 Please see Notes tab for imaging impression.  CT ABDOMEN PELVIS W CONTRAST  Result Date: 05/27/2020 CLINICAL DATA:  Right upper quadrant abdominal pain. Elevated alkaline phosphatase EXAM: CT ABDOMEN AND PELVIS WITH CONTRAST TECHNIQUE: Multidetector CT imaging of the abdomen and pelvis was performed using the standard protocol following bolus administration of intravenous contrast. CONTRAST:  110m OMNIPAQUE IOHEXOL 300 MG/ML  SOLN COMPARISON:  None. FINDINGS: Lower chest: Bibasilar subsegmental atelectasis. Heart size within normal limits. Hepatobiliary: No focal liver abnormality is seen. No gallstones, gallbladder wall thickening, or biliary dilatation. Pancreas: Unremarkable. No pancreatic ductal dilatation or surrounding inflammatory changes. Spleen: Normal in size without focal abnormality. Adrenals/Urinary Tract: Unremarkable adrenal glands. Area of cortical scarring within the midpole of the right kidney. Kidneys have otherwise symmetric enhancement. No renal lesion, stone, or hydronephrosis. Urinary bladder is within normal limits. Stomach/Bowel: Stomach appears within normal limits. No dilated loops of small bowel. Slightly hyperenhancing appendix with periappendiceal fat stranding (series 2, images 39-46). No focal colonic wall thickening. Vascular/Lymphatic: Fusiform infrarenal abdominal aortic aneurysm measuring 3.5 cm in maximal AP dimension. Extensive calcified and noncalcified atherosclerotic plaque throughout the aorta. No abdominopelvic lymphadenopathy is identified. Reproductive: Heterogeneous although nonenlarged prostate gland. Other: No free fluid. No abdominopelvic fluid collection. No pneumoperitoneum. No abdominal wall hernia. Musculoskeletal: Diffusely patchy sclerotic appearance of the osseous  structures. Mild superior endplate compression deformity of L4 with less than 10% vertebral body height loss, age indeterminate. IMPRESSION: 1. Findings suspicious for early  acute uncomplicated appendicitis. 2. Diffusely patchy sclerotic appearance of the osseous structures suspicious for osseous metastatic disease. Given patient demographics, metastatic prostate cancer could be potential etiology. Prostate gland is heterogeneous in appearance. Correlate with serum PSA. 3. Mild superior endplate compression deformity of L4 with less than 10% vertebral body height loss, age indeterminate. Correlate for point tenderness. 4. Fusiform infrarenal abdominal aortic aneurysm measuring up to 3.5 cm. Recommend follow-up ultrasound every 2 years. This recommendation follows ACR consensus guidelines: White Paper of the ACR Incidental Findings Committee II on Vascular Findings. J Am Coll Radiol 2013; 10:789-794. Aortic Atherosclerosis (ICD10-I70.0). Electronically Signed   By: Davina Poke D.O.   On: 05/27/2020 12:58   MR SACRUM SI JOINTS W WO CONTRAST  Result Date: 06/18/2020 CLINICAL DATA:  Metastatic prostate cancer EXAM: MRI PELVIS (SACRUM) WITHOUT AND WITH CONTRAST TECHNIQUE: Multiplanar multisequence MR imaging of the pelvis was performed both before and after administration of intravenous contrast. Sacral protocol utilized. CONTRAST:  7.33m GADAVIST GADOBUTROL 1 MMOL/ML IV SOLN COMPARISON:  CT pelvis 05/27/2020 FINDINGS: Osseous structures: Findings of diffuse metastatic disease throughout the visualized sacrum and in the adjacent iliac bones and ischia. There is some relative sparing of the coccyx. This manifests is diffusely low T2 signal in the involved regions, as well as patchy enhancement. This matches the diffuse sclerotic appearance with sparing of the coccyx seen on CT of 05/27/2020. In the sacrum, I do not observe compelling findings of superimposed fracture or substantial extraosseous extension. There is some anterior spurring along the SI joints. Degenerative facet arthropathy at L5-S1 noted. Sacroiliac joints: No erosion or effusion. Presacral soft tissues: There is  moderate presacral edema without a discrete mass. Sacral plexus: No discrete impinging lesion is identified along the sacral plexus or sciatic notch. Surrounding tissues: Mild edema signal is present in the iliacus and gluteus medius and minimus musculature in a symmetric manner. There is also some low-level edema in the medial piriformis musculature bilaterally. Visualized portion of the rectum unremarkable. IMPRESSION: 1. Diffuse malignant involvement of the sacrum as well as the adjacent portions of the iliac bones and ischia. Relative sparing of the coccyx. No appreciable fracture or impingement along the sacral plexus or sciatic notch region, although there is some regional edema tracking along adjacent musculature and in the presacral space as detailed above. Electronically Signed   By: WVan ClinesM.D.   On: 06/18/2020 15:50   [place] Img Lower Bilateral (DVT)  Result Date: 06/18/2020 CLINICAL DATA:  Initial evaluation for acute pain for several weeks. EXAM: BILATERAL LOWER EXTREMITY VENOUS DOPPLER ULTRASOUND TECHNIQUE: Gray-scale sonography with graded compression, as well as color Doppler and duplex ultrasound were performed to evaluate the lower extremity deep venous systems from the level of the common femoral vein and including the common femoral, femoral, profunda femoral, popliteal and calf veins including the posterior tibial, peroneal and gastrocnemius veins when visible. The superficial great saphenous vein was also interrogated. Spectral Doppler was utilized to evaluate flow at rest and with distal augmentation maneuvers in the common femoral, femoral and popliteal veins. COMPARISON:  None. FINDINGS: RIGHT LOWER EXTREMITY Common Femoral Vein: No evidence of thrombus. Normal compressibility, respiratory phasicity and response to augmentation. Saphenofemoral Junction: No evidence of thrombus. Normal compressibility and flow on color Doppler imaging. Profunda Femoral Vein: No evidence of  thrombus. Normal compressibility and flow on  color Doppler imaging. Femoral Vein: No evidence of thrombus. Normal compressibility, respiratory phasicity and response to augmentation. Popliteal Vein: No evidence of thrombus. Normal compressibility, respiratory phasicity and response to augmentation. Calf Veins: No evidence of thrombus. Normal compressibility and flow on color Doppler imaging. Superficial Great Saphenous Vein: No evidence of thrombus. Normal compressibility. Venous Reflux:  None. Other Findings:  None. LEFT LOWER EXTREMITY Common Femoral Vein: No evidence of thrombus. Normal compressibility, respiratory phasicity and response to augmentation. Saphenofemoral Junction: No evidence of thrombus. Normal compressibility and flow on color Doppler imaging. Profunda Femoral Vein: No evidence of thrombus. Normal compressibility and flow on color Doppler imaging. Femoral Vein: No evidence of thrombus. Normal compressibility, respiratory phasicity and response to augmentation. Popliteal Vein: No evidence of thrombus. Normal compressibility, respiratory phasicity and response to augmentation. Calf Veins: No evidence of thrombus. Normal compressibility and flow on color Doppler imaging. Superficial Great Saphenous Vein: No evidence of thrombus. Normal compressibility. Venous Reflux:  None. Other Findings:  None. IMPRESSION: No evidence of deep venous thrombosis in either lower extremity. Electronically Signed   By: Jeannine Boga M.D.   On: 06/18/2020 19:01   DG Chest Portable 1 View  Result Date: 05/27/2020 CLINICAL DATA:  Fever and myalgia EXAM: PORTABLE CHEST 1 VIEW COMPARISON:  None. FINDINGS: Normal heart size and mediastinal contours. No acute infiltrate or edema. No effusion or pneumothorax. No acute osseous findings. Remote left distal clavicle fracture with hypertrophic attempted healing. IMPRESSION: Negative for pneumonia. Electronically Signed   By: Monte Fantasia M.D.   On: 05/27/2020 09:52    [place] PROSTATE BIOPSY MULTIPLE  Result Date: 06/16/2020 Please see Notes tab for imaging impression.   PERFORMANCE STATUS (ECOG) : 1 - Symptomatic but completely ambulatory  Review of Systems Unless otherwise noted, a complete review of systems is negative.  Physical Exam General: NAD Cardiovascular: regular rate and rhythm Pulmonary: Unlabored Extremities: no edema, no joint deformities Skin: no rashes Neurological: Weakness but otherwise nonfocal  IMPRESSION: Met with patient and wife.  I introduced palliative care services and attempt to establish therapeutic rapport.  Patient reports that he is doing significantly better today.  He denies any symptomatic complaints at present.  He was having significant pain but this is also improved.  At baseline, patient takes oxycodone at home for management of his pain.  He has been referred to radiation oncology.  Hopefully pain will stabilize once he begins treatment.  Patient is anxious to start cancer treatment.  His goals clearly align with the current scope of treatment.  He is optimistic and faithful that God and prayer will facilitate his healing.   Patient reports that his son is currently hospitalized in the ICU at [facility].  Patient has had two serve as his son is proxy decision-maker given the critical nature of his illness.  Emotional support provided.  PLAN: -Continue current scope of treatment -Continue oxycodone PRN -Will plan to follow patient in the clinic following discharge from the hospital  Case and plan discussed with Dr. Tasia Catchings   Time Total: 60 minutes  Visit consisted of counseling and education dealing with the complex and emotionally intense issues of symptom management and palliative care in the setting of serious and potentially life-threatening illness.Greater than 50%  of this time was spent counseling and coordinating care related to the above assessment and plan.  Signed by: Altha Harm, PhD, NP-C

## 2020-06-19 NOTE — Consult Note (Signed)
Urology Consult  I have been asked to see the patient by Dr. Verlon Au, for evaluation and management of fever following prostate biopsy.  Chief Complaint: Syncope, hematuria, hematochezia  History of Present Illness: Cody Vaughan is a 60 y.o. year old male with elevated PSA and CT scan concerning for widely metastatic bone lesions consistent with metastatic prostate cancer who underwent prostate biopsy in the OR with Dr. Diamantina Providence 3 days ago.  Procedure was without complication and he was discharged home following; biopsy results are pending.  He was brought to the ED via EMS later that day with reports of an episode of near syncope, syncope, hematuria, and blood in stool.  He was admitted for cardiac monitoring due to hypotension.  Admission labs notable for hemoglobin 10.4, 8.7 yesterday; creatinine elevated over baseline to 1.05, now stable at 0.77.  Yesterday, patient developed fever and severe low back and BLE pain.  He had difficulty moving his lower extremities secondary to pain.  UA notable for >50 RBCs/hpf, no nitrites, no WBCs, and rare bacteria; lactate remained WNL. Urine culture finalized with no growth.  Blood cultures pending with no growth at 1 day.  He underwent MRI of the thoracic, lumbar, and sacral spine yesterday morning, with no evidence of cauda equina.  He was found to have diffuse malignant involvement of the lumbar spine, sacrum, and bilateral iliac bones.  Oncology and radiation oncology are involved in his case.  Today he reports significant improvement in his pain since yesterday.  He can move his legs without pain and notes only mild residual discomfort in his lower back.  He has been afebrile overnight.  Anti-infectives (From admission, onward)   Start     Dose/Rate Route Frequency Ordered Stop   06/18/20 1200  meropenem (MERREM) 1 g in sodium chloride 0.9 % 100 mL IVPB        1 g 200 mL/hr over 30 Minutes Intravenous Every 8 hours 06/18/20 1132     06/18/20 0330   cefTRIAXone (ROCEPHIN) 1 g in sodium chloride 0.9 % 100 mL IVPB  Status:  Discontinued        1 g 200 mL/hr over 30 Minutes Intravenous Every 24 hours 06/18/20 0215 06/18/20 1131      Past Medical History:  Diagnosis Date  . Prostate cancer ([place])    2022    Past Surgical History:  Procedure Laterality Date  . PROSTATE BIOPSY N/A 06/16/2020   Procedure: PROSTATE BIOPSY;  Surgeon: Billey Co, MD;  Location: [facility] ORS;  Service: Urology;  Laterality: N/A;  . SHOULDER SURGERY     1985  . TRANSRECTAL ULTRASOUND N/A 06/16/2020   Procedure: TRANSRECTAL ULTRASOUND;  Surgeon: Billey Co, MD;  Location: [facility] ORS;  Service: Urology;  Laterality: N/A;    Home Medications:  Current Meds  Medication Sig  . acetaminophen (TYLENOL) 325 MG tablet Take 650 mg by mouth every 6 (six) hours as needed for moderate pain or mild pain.  . magnesium 30 MG tablet Take 30 mg by mouth 2 (two) times daily.  . Multiple Vitamins-Minerals (MULTIVITAMIN WITH MINERALS) tablet Take 1 tablet by mouth daily.  Marland Kitchen oxyCODONE (OXY IR/ROXICODONE) 5 MG immediate release tablet Take 1 tablet (5 mg total) by mouth every 6 (six) hours as needed for severe pain.  Marland Kitchen Potassium 99 MG TABS Take 99 mg by mouth daily.    Allergies: No Known Allergies  Family History  Problem Relation Age of Onset  . COPD Mother   .  Heart attack Father     Social History:  reports that he has been smoking. He has been smoking about 1.00 pack per day. He has never used smokeless tobacco. He reports previous alcohol use. He reports that he does not use drugs.  ROS: A complete review of systems was performed.  All systems are negative except for pertinent findings as noted.  Physical Exam:  Vital signs in last 24 hours: Temp:  [97.3 F (36.3 C)-102.9 F (39.4 C)] 97.8 F (36.6 C) (04/18 0734) Pulse Rate:  [70-106] 70 (04/18 0734) Resp:  [16-17] 17 (04/18 0734) BP: (88-118)/(53-71) 109/57 (04/18 0734) SpO2:  [93 %-100 %] 97 %  (04/18 0734) Weight:  [76.3 kg] 76.3 kg (04/18 0525) Constitutional:  Alert and oriented, no acute distress HEENT: [place] AT, moist mucus membranes Cardiovascular: No clubbing, cyanosis, or edema Respiratory: Normal respiratory effort Skin: No rashes, bruises or suspicious lesions Neurologic: Grossly intact, no focal deficits, moving all 4 extremities Psychiatric: Normal mood and affect  Laboratory Data:  Recent Labs    06/16/20 1109 06/16/20 1638 06/17/20 0144 06/17/20 0705 06/17/20 1309 06/18/20 0904  WBC 9.8  --  7.3  --   --  7.7  HGB 10.4*   < > 8.2* 8.8* 9.1* 8.7*  HCT 31.5*   < > 24.8* 25.7* 26.8* 25.3*   < > = values in this interval not displayed.   Recent Labs    06/16/20 1109 06/17/20 0144 06/18/20 0904  NA 138 136 132*  K 3.5 3.9 3.8  CL 104 106 99  CO2 24 22 23   GLUCOSE 134* 103* 114*  BUN 14 12 13   CREATININE 1.05 0.74 0.77  CALCIUM 8.9 8.6* 7.9*   Recent Labs    06/16/20 1109  INR 1.1   Urinalysis    Component Value Date/Time   COLORURINE YELLOW (A) 06/18/2020 0017   APPEARANCEUR HAZY (A) 06/18/2020 0017   APPEARANCEUR Clear 06/14/2020 1503   LABSPEC 1.011 06/18/2020 0017   LABSPEC 1.017 09/01/2012 1709   PHURINE 5.0 06/18/2020 0017   GLUCOSEU NEGATIVE 06/18/2020 0017   GLUCOSEU Negative 09/01/2012 1709   HGBUR LARGE (A) 06/18/2020 0017   BILIRUBINUR NEGATIVE 06/18/2020 0017   BILIRUBINUR Negative 06/14/2020 1503   BILIRUBINUR Negative 09/01/2012 1709   KETONESUR NEGATIVE 06/18/2020 0017   PROTEINUR NEGATIVE 06/18/2020 0017   NITRITE NEGATIVE 06/18/2020 0017   LEUKOCYTESUR NEGATIVE 06/18/2020 0017   LEUKOCYTESUR Negative 09/01/2012 1709   Results for orders placed or performed during the hospital encounter of 06/16/20  CULTURE, BLOOD (ROUTINE X 2) w Reflex to ID Panel     Status: None (Preliminary result)   Collection Time: 06/18/20 12:58 AM   Specimen: BLOOD LEFT HAND  Result Value Ref Range Status   Specimen Description BLOOD LEFT HAND   Final   Special Requests   Final    BOTTLES DRAWN AEROBIC AND ANAEROBIC Blood Culture adequate volume   Culture   Final    NO GROWTH 1 DAY Performed at [facility], [address] [place], [place] [address]    Report Status PENDING  Incomplete  CULTURE, BLOOD (ROUTINE X 2) w Reflex to ID Panel     Status: None (Preliminary result)   Collection Time: 06/18/20 12:58 AM   Specimen: BLOOD LEFT WRIST  Result Value Ref Range Status   Specimen Description BLOOD LEFT WRIST  Final   Special Requests   Final    BOTTLES DRAWN AEROBIC AND ANAEROBIC Blood Culture adequate volume  Culture   Final    NO GROWTH 1 DAY Performed at [facility], [address] [place], [place] [address]    Report Status PENDING  Incomplete    Radiologic Imaging: DG Pelvis 1-2 Views  Result Date: 06/18/2020 CLINICAL DATA:  Fall on Friday. History of substance abuse. History of metastatic prostate cancer. EXAM: PELVIS - 1-2 VIEW COMPARISON:  None. FINDINGS: No evidence of acute osseous fracture or dislocation. Heterogeneous mineralization throughout the osseous pelvis, compatible with underlying osseous metastases, better demonstrated on earlier CT abdomen of 05/27/2020. Soft tissues about the pelvis are unremarkable. IMPRESSION: No acute findings. No evidence of acute osseous fracture or dislocation. Osseous metastases, better demonstrated on earlier CT abdomen of 05/27/2020. Electronically Signed   By: Franki Cabot M.D.   On: 06/18/2020 10:04   MR THORACIC SPINE W WO CONTRAST  Result Date: 06/18/2020 CLINICAL DATA:  Metastatic disease evaluation. Metastatic prostate cancer with back pain. EXAM: MRI THORACIC WITHOUT AND WITH CONTRAST TECHNIQUE: Multiplanar and multiecho pulse sequences of the thoracic spine were obtained without and with intravenous contrast. CONTRAST:  7.14mL GADAVIST GADOBUTROL 1 MMOL/ML IV SOLN COMPARISON:  CT chest 06/14/2020 FINDINGS: Intermittent motion degradation.  Alignment:  No significant spondylolisthesis. Vertebrae: Multilevel Schmorl nodes. Vertebral body height is otherwise maintained. There is diffuse heterogeneous signal abnormality and patchy corresponding enhancement throughout the visualized thoracic spine and posterior ribs compatible with diffuse osseous metastatic disease. No evidence of pathologic compression fracture. Cord: No spinal cord signal abnormality is identified. No definite dural-based metastatic disease is identified. Paraspinal and other soft tissues: No definite soft tissue extension of tumor is identified. Specifically, no paraspinal masses identified. Disc levels: Mild-to-moderate disc degeneration throughout the thoracic spine. Multilevel shallow disc bulges and facet arthrosis. No significant focal disc herniation, spinal canal stenosis or neural foraminal narrowing. IMPRESSION: Diffuse osseous metastatic disease throughout the visualized thoracic spine and posterior ribs. No pathologic compression fracture. No definite dural-based tumor is identified within the thoracic spinal canal. No soft tissue extension of tumor is identified. Thoracic spondylosis as described without significant spinal canal stenosis or neural foraminal narrowing. Electronically Signed   By: Kellie Simmering DO   On: 06/18/2020 16:06   MR Lumbar Spine W Wo Contrast  Result Date: 06/18/2020 CLINICAL DATA:  Metastatic disease evaluation. Metastatic prostate cancer. Back pain. EXAM: MRI LUMBAR SPINE WITHOUT AND WITH CONTRAST TECHNIQUE: Multiplanar and multiecho pulse sequences of the lumbar spine were obtained without and with intravenous contrast. CONTRAST:  7.39mL GADAVIST GADOBUTROL 1 MMOL/ML IV SOLN COMPARISON:  CT abdomen/pelvis 05/27/2020. FINDINGS: Segmentation: 5 lumbar vertebrae. The caudal most well-formed intervertebral disc space is designated L5-S1. Alignment:  No significant spondylolisthesis. Vertebrae: Multilevel Schmorl nodes. Mild chronic L4 superior  endplate compression deformity. Vertebral body height is otherwise maintained. No acute pathologic compression fracture. There is diffuse heterogeneous osseous signal abnormality and patchy enhancement throughout the visualized lumbar spine, sacrum and bilateral iliac bones compatible with diffuse osseous metastatic disease. Conus medullaris and cauda equina: Conus extends to the L2 level. No signal abnormality within the visualized distal spinal cord. No definite dural-based tumor is identified. Paraspinal and other soft tissues: No extraosseous tumor is identified. Specifically, no paraspinal soft tissue mass is appreciated. Disc levels: Multilevel disc degeneration. Most notably, there is moderate to moderately advanced disc degeneration at T12-L1, L1-L2 and L2-L3. T12-L1: Small disc bulge. Mild facet arthrosis. Mild relative spinal canal narrowing. No significant foraminal stenosis. L1-L2: Disc bulge with endplate spurring. Superimposed small left subarticular/foraminal disc protrusion. Mild facet  arthrosis. Mild bilateral subarticular and central canal narrowing without appreciable nerve root impingement. Mild left neural foraminal narrowing. L2-L3: Disc bulge with endplate spurring. Moderate facet arthrosis with ligamentum flavum hypertrophy. Moderate subtle subarticular stenosis with potential to affect either descending L3 nerve root. Moderate central canal stenosis. Moderate bilateral neural foraminal narrowing. L3-L4: Disc bulge with endplate spurring. Superimposed left foraminal/extraforaminal disc protrusion at site of posterior annular fissure. Moderate facet arthrosis with slight ligamentum flavum hypertrophy. Mild bilateral subarticular narrowing without appreciable nerve root impingement. Central canal patent. Moderate left neural foraminal narrowing L4-L5: Disc bulge with superimposed broad-based shallow central disc protrusion. Facet arthrosis (mild right, moderate/advanced left). Ligamentum flavum  hypertrophy. Bilateral subarticular narrowing (moderate right, mild left) with potential to affect the descending right L5 nerve root. Mild to moderate central canal narrowing. Moderate bilateral neural foraminal narrowing. L5-S1: Disc bulge with slight endplate spurring. Advanced facet arthrosis with ligamentum flavum hypertrophy. Prominence of the dorsal epidural fat. Mild bilateral subarticular narrowing without appreciable nerve root impingement. No significant foraminal stenosis. IMPRESSION: Findings compatible with diffuse osseous metastatic disease within the visualized lumbar spine, sacrum and bilateral iliac bones. No definite dural-based tumor is identified within the lumbar spinal canal. No extraosseous tumor extension is identified. Lumbar spondylosis as outlined. Canal stenosis is greatest at L2-L3 (moderate) and L4-L5 (mild/moderate). Potential nerve root impingement within the spinal canal at L2-L3 and L4-L5. Sites of mild and moderate neural foraminal narrowing as detailed. Electronically Signed   By: Kellie Simmering DO   On: 06/18/2020 16:21   MR SACRUM SI JOINTS W WO CONTRAST  Result Date: 06/18/2020 CLINICAL DATA:  Metastatic prostate cancer EXAM: MRI PELVIS (SACRUM) WITHOUT AND WITH CONTRAST TECHNIQUE: Multiplanar multisequence MR imaging of the pelvis was performed both before and after administration of intravenous contrast. Sacral protocol utilized. CONTRAST:  7.46mL GADAVIST GADOBUTROL 1 MMOL/ML IV SOLN COMPARISON:  CT pelvis 05/27/2020 FINDINGS: Osseous structures: Findings of diffuse metastatic disease throughout the visualized sacrum and in the adjacent iliac bones and ischia. There is some relative sparing of the coccyx. This manifests is diffusely low T2 signal in the involved regions, as well as patchy enhancement. This matches the diffuse sclerotic appearance with sparing of the coccyx seen on CT of 05/27/2020. In the sacrum, I do not observe compelling findings of superimposed  fracture or substantial extraosseous extension. There is some anterior spurring along the SI joints. Degenerative facet arthropathy at L5-S1 noted. Sacroiliac joints: No erosion or effusion. Presacral soft tissues: There is moderate presacral edema without a discrete mass. Sacral plexus: No discrete impinging lesion is identified along the sacral plexus or sciatic notch. Surrounding tissues: Mild edema signal is present in the iliacus and gluteus medius and minimus musculature in a symmetric manner. There is also some low-level edema in the medial piriformis musculature bilaterally. Visualized portion of the rectum unremarkable. IMPRESSION: 1. Diffuse malignant involvement of the sacrum as well as the adjacent portions of the iliac bones and ischia. Relative sparing of the coccyx. No appreciable fracture or impingement along the sacral plexus or sciatic notch region, although there is some regional edema tracking along adjacent musculature and in the presacral space as detailed above. Electronically Signed   By: Van Clines M.D.   On: 06/18/2020 15:50   US Venous Img Lower Bilateral (DVT)  Result Date: 06/18/2020 CLINICAL DATA:  Initial evaluation for acute pain for several weeks. EXAM: BILATERAL LOWER EXTREMITY VENOUS DOPPLER ULTRASOUND TECHNIQUE: Gray-scale sonography with graded compression, as well as color Doppler and duplex ultrasound  were performed to evaluate the lower extremity deep venous systems from the level of the common femoral vein and including the common femoral, femoral, profunda femoral, popliteal and calf veins including the posterior tibial, peroneal and gastrocnemius veins when visible. The superficial great saphenous vein was also interrogated. Spectral Doppler was utilized to evaluate flow at rest and with distal augmentation maneuvers in the common femoral, femoral and popliteal veins. COMPARISON:  None. FINDINGS: RIGHT LOWER EXTREMITY Common Femoral Vein: No evidence of  thrombus. Normal compressibility, respiratory phasicity and response to augmentation. Saphenofemoral Junction: No evidence of thrombus. Normal compressibility and flow on color Doppler imaging. Profunda Femoral Vein: No evidence of thrombus. Normal compressibility and flow on color Doppler imaging. Femoral Vein: No evidence of thrombus. Normal compressibility, respiratory phasicity and response to augmentation. Popliteal Vein: No evidence of thrombus. Normal compressibility, respiratory phasicity and response to augmentation. Calf Veins: No evidence of thrombus. Normal compressibility and flow on color Doppler imaging. Superficial Great Saphenous Vein: No evidence of thrombus. Normal compressibility. Venous Reflux:  None. Other Findings:  None. LEFT LOWER EXTREMITY Common Femoral Vein: No evidence of thrombus. Normal compressibility, respiratory phasicity and response to augmentation. Saphenofemoral Junction: No evidence of thrombus. Normal compressibility and flow on color Doppler imaging. Profunda Femoral Vein: No evidence of thrombus. Normal compressibility and flow on color Doppler imaging. Femoral Vein: No evidence of thrombus. Normal compressibility, respiratory phasicity and response to augmentation. Popliteal Vein: No evidence of thrombus. Normal compressibility, respiratory phasicity and response to augmentation. Calf Veins: No evidence of thrombus. Normal compressibility and flow on color Doppler imaging. Superficial Great Saphenous Vein: No evidence of thrombus. Normal compressibility. Venous Reflux:  None. Other Findings:  None. IMPRESSION: No evidence of deep venous thrombosis in either lower extremity. Electronically Signed   By: Jeannine Boga M.D.   On: 06/18/2020 19:01   Assessment & Plan:  60 year old male with elevated PSA and imaging findings consistent with bony metastasis from likely underlying prostate cancer admitted following prostate biopsy with fever, also with gross hematuria,  hematochezia consistent with anticipated bleeding following biopsy.  Patient clinically improving today.  Continue meropenem for management of postbiopsy infection.  Continue to monitor blood counts and transfuse as needed, however again gross hematuria, hematochezia, and hematospermia overall normal findings following biopsy.  Agree with oncology and radiation oncology involvement.  Dr. Diamantina Providence to reach out to pathology regarding expediting prostate biopsy results.  Thank you for involving me in this patient's care, I will continue to follow along.  Debroah Loop, PA-C 06/19/2020 8:57 AM

## 2020-06-19 NOTE — Progress Notes (Signed)
   06/19/20 1535  Clinical Encounter Type  Visited With Patient and family together  Visit Type Initial  Referral From Nurse  Consult/Referral To [place] responded to an OR in room 259A, Pt Cody Vaughan. Pt wife Cody Vaughan was by his bedside. Pt stated, he has a long road ahead of him but he know God is with him. The Pt wife stated, "he had a rough night but I called people and everyone prayed for him and look at him now, he is sitting up and doing better than he was last night." Pt was in good spirits today. I provided emotional and spiritual support by using reflective listening and words of encouragement.

## 2020-06-20 MED ORDER — NICOTINE 21 MG/24HR TD PT24: 21.0000 mg | MEDICATED_PATCH | Freq: Every day | TRANSDERMAL | 0 refills | Status: DC

## 2020-06-20 MED ORDER — IBUPROFEN 400 MG PO TABS: 400.0000 mg | ORAL_TABLET | Freq: Four times a day (QID) | ORAL | 0 refills | Status: DC

## 2020-06-20 MED ORDER — SULFAMETHOXAZOLE-TRIMETHOPRIM 800-160 MG PO TABS: 1.0000 | ORAL_TABLET | Freq: Two times a day (BID) | ORAL | 0 refills | Status: AC

## 2020-06-20 NOTE — TOC Transition Note (Signed)
Transition of Care [facility]) - CM/SW Discharge Note   Patient Details  Name: BUCKY GRIGG MRN: [id number] Date of Birth: Dec 06, 1960  [facility] CM/SW Contact:  Kerin Salen, RN Phone Number: 06/20/2020, 12:19 PM   Clinical Narrative: Spoke with patient and wife at bedside, for discharge needs. Both agree that Long rolling walker are not needed. The wife states the living area is on one level. Patient states he has been able to do ADL's and walk to bathroom in the last two days independently. A list of Clinics in the area was given to them so they can call and schedule appointment with PCP. No other TOC needs identified at this time.      Barriers to Discharge: Continued Medical Work up   Patient Goals and CMS Choice Patient states their goals for this hospitalization and ongoing recovery are:: home with charity home health CMS Medicare.gov Compare Post Acute Care list provided to:: Patient Choice offered to / list presented to : Patient  Discharge Placement                       Discharge Plan and Services                DME Arranged: Gilford Rile rolling,3-N-1 DME Agency:  [facility]) Date DME Agency Contacted: 06/18/20   Representative spoke with at DME Agency: Melene Muller [facility] Arranged: PT          Social Determinants of Health ([place]) Interventions     Readmission Risk Interventions No flowsheet data found.

## 2020-06-20 NOTE — Discharge Summary (Signed)
Physician Discharge Summary  Cody Vaughan ZOX:[id number] DOB: [birth year] DOA: 06/16/2020  PCP: Patient, No Pcp Per (Inactive)  Admit date: 06/16/2020 Discharge date: 06/20/2020  Time spent: 27 minutes  Recommendations for Outpatient Follow-up:  1. Outpatient close follow-up with oncology 2. Complete Bactrim DS-caution given with regards to adequate hydration etc. 3. MAR changed as per below   Discharge Diagnoses:  MAIN problem for hospitalization   Metastatic prostate cancer with volume depletion orthostasis and postprocedural prostate bleeding from recent surgery Sepsis ruled in this admission  Please see below for itemized issues addressed in [place]- refer to other progress notes for clarity if needed  Discharge Condition: Improved  Diet recommendation: reg   Filed Weights   06/16/20 1047 06/19/20 0525 06/20/20 0411  Weight: 75.8 kg 76.3 kg 74.7 kg    History of present illness:  Sepsis ruled in this admission with no source Spiked fever (despite being on ceftriaxone IV) 102.8, hypotensive in addition at this time-blood cultures are negative from 4/17 a.m. Meropenem has been narrowed to Bactrim DS on discharge which he will complete for 10 days Initially received IV fluid boluses 4/18 Overall much improved-normotensive and feels much better and is able to move lower extremities Presyncope hypotension Combination of blood loss anemia Fluids DC'd on discharge See above required boluses earlier in hospital stay Prostatic biopsy 4/15 Probable metastatic prostate cancer still being worked up by Dr. Tasia Catchings Some amount of hematuria, dark stool as expected per urologist--resolved Pain control morphine 2 mg every 4 as needed, oxycodone 5-->10 mg as needed for moderate pain (patient on OxyIR 5 mg at home) -MRI lumbosacral spine does not show any concern for cauda equina-started but then discontinued Decadron Patient received this hospital stay per Dr. Tasia Catchings 1 dose [place] Koller  She will  follow-up and ensure that the patient has read on follow-up in addition AKI on admission Resolved Recheck periodically labs Myalgias in both lower extremities and pain earlier in admission Retrospectively could this have been secondary to rhabdo??? CK was 300 on retrospective analysis and track   Consultations:  Palliative care  Oncology  Urology  Discharge Exam: Vitals:   06/19/20 1935 06/20/20 0411  BP: 116/69 109/71  Pulse: 82 76  Resp: 18 18  Temp: 98.2 F (36.8 C) 97.8 F (36.6 C)  SpO2: 98% 98%    Subj on day of d/c   Well ambulatory no distress  General Exam on discharge  EOMI NCAT no focal deficit Chest clear no added sound S1-S2 no murmur Abdomen soft no rebound Prostate exam deferred No lower extremity edema Power 5/5 to antigravity muscles ambulatory  Discharge Instructions   Discharge Instructions    Diet - low sodium heart healthy   Complete by: As directed    Discharge instructions   Complete by: As directed    Please keep your follow-up appointments with your oncologist They will see you and obtain a bone scan and further diagnostic studies please complete your Bactrim DS-as this can sometimes cause dehydration make sure that you drink adequate amount of water Take the ibuprofen 4 moderate pain and use the Percocet or opiate medications for breakthrough pain for pain above 6/10 Dr. Darreld Mclean you will coordinate your outpatient management and care-take care and God Bless   Increase activity slowly   Complete by: As directed      Allergies as of 06/20/2020   No Known Allergies     Medication List    STOP taking these medications   magnesium 30 MG  tablet   Potassium 99 MG Tabs     TAKE these medications   acetaminophen 325 MG tablet Commonly known as: TYLENOL Take 650 mg by mouth every 6 (six) hours as needed for moderate pain or mild pain.   ibuprofen 400 MG tablet Commonly known as: ADVIL Take 1 tablet (400 mg total) by mouth 4 (four)  times daily.   multivitamin with minerals tablet Take 1 tablet by mouth daily.   nicotine 21 mg/24hr patch Commonly known as: NICODERM CQ - dosed in mg/24 hours Place 1 patch (21 mg total) onto the skin daily. Start taking on: June 21, 2020   oxyCODONE 5 MG immediate release tablet Commonly known as: Oxy IR/ROXICODONE Take 1 tablet (5 mg total) by mouth every 6 (six) hours as needed for severe pain.   sulfamethoxazole-trimethoprim 800-160 MG tablet Commonly known as: BACTRIM DS Take 1 tablet by mouth 2 (two) times daily for 10 days.      No Known Allergies    The results of significant diagnostics from this hospitalization (including imaging, microbiology, ancillary and laboratory) are listed below for reference.    Significant Diagnostic Studies: DG Pelvis 1-2 Views  Result Date: 06/18/2020 CLINICAL DATA:  Fall on Friday. History of substance abuse. History of metastatic prostate cancer. EXAM: PELVIS - 1-2 VIEW COMPARISON:  None. FINDINGS: No evidence of acute osseous fracture or dislocation. Heterogeneous mineralization throughout the osseous pelvis, compatible with underlying osseous metastases, better demonstrated on earlier CT abdomen of 05/27/2020. Soft tissues about the pelvis are unremarkable. IMPRESSION: No acute findings. No evidence of acute osseous fracture or dislocation. Osseous metastases, better demonstrated on earlier CT abdomen of 05/27/2020. Electronically Signed   By: Franki Cabot M.D.   On: 06/18/2020 10:04   CT CHEST W CONTRAST  Result Date: 06/16/2020 CLINICAL DATA:  Bone pain with weakness and weight loss for several weeks. Sclerotic osseous lesions on abdominopelvic CT suspicious for metastatic disease. EXAM: CT CHEST WITH CONTRAST TECHNIQUE: Multidetector CT imaging of the chest was performed during intravenous contrast administration. CONTRAST:  62mL OMNIPAQUE IOHEXOL 300 MG/ML  SOLN COMPARISON:  Abdominopelvic CT 05/27/2020. One view chest 05/27/2020.  FINDINGS: Cardiovascular: Atherosclerosis of the aorta, great vessels and coronary arteries with mild intimal irregularity of the aortic arch. The ascending aorta is mildly dilated, having a maximal diameter 4.1 cm. No acute vascular findings are evident. The heart size is normal. Trace pericardial fluid. Mediastinum/Nodes: There are no enlarged mediastinal, hilar or axillary lymph nodes. The thyroid gland, trachea and esophagus demonstrate no significant findings. Lungs/Pleura: No pleural effusion or pneumothorax. Mild centrilobular and paraseptal emphysema with scattered parenchymal scarring. No suspicious pulmonary nodularity. Upper abdomen: The visualized upper abdomen appears stable without significant findings. The gallbladder is contracted. Musculoskeletal/Chest wall: Diffuse ill-defined sclerosis noted throughout the thoracic spine, sternum, ribs and scapula suspicious for blastic metastatic disease. Scattered Schmorl's node formation in the spine without evidence of acute fracture or epidural tumor. IMPRESSION: 1. Widespread sclerosis throughout the bones of the chest suspicious for osseous metastatic disease. 2. No evidence of extra osseous thoracic metastases or primary neoplasm. 3. Aortic Atherosclerosis (ICD10-I70.0). Mild dilatation of the ascending aorta. Recommend annual imaging followup by CTA or MRA. This recommendation follows 2010 ACCF/AHA/AATS/ACR/ASA/SCA/SCAI/SIR/STS/SVM Guidelines for the Diagnosis and Management of Patients with Thoracic Aortic Disease. Circulation. 2010; 121: W967-R916 Electronically Signed   By: Richardean Sale M.D.   On: 06/16/2020 08:57   MR THORACIC SPINE W WO CONTRAST  Result Date: 06/18/2020 CLINICAL DATA:  Metastatic disease evaluation.  Metastatic prostate cancer with back pain. EXAM: MRI THORACIC WITHOUT AND WITH CONTRAST TECHNIQUE: Multiplanar and multiecho pulse sequences of the thoracic spine were obtained without and with intravenous contrast. CONTRAST:   7.42mL GADAVIST GADOBUTROL 1 MMOL/ML IV SOLN COMPARISON:  CT chest 06/14/2020 FINDINGS: Intermittent motion degradation. Alignment:  No significant spondylolisthesis. Vertebrae: Multilevel Schmorl nodes. Vertebral body height is otherwise maintained. There is diffuse heterogeneous signal abnormality and patchy corresponding enhancement throughout the visualized thoracic spine and posterior ribs compatible with diffuse osseous metastatic disease. No evidence of pathologic compression fracture. Cord: No spinal cord signal abnormality is identified. No definite dural-based metastatic disease is identified. Paraspinal and other soft tissues: No definite soft tissue extension of tumor is identified. Specifically, no paraspinal masses identified. Disc levels: Mild-to-moderate disc degeneration throughout the thoracic spine. Multilevel shallow disc bulges and facet arthrosis. No significant focal disc herniation, spinal canal stenosis or neural foraminal narrowing. IMPRESSION: Diffuse osseous metastatic disease throughout the visualized thoracic spine and posterior ribs. No pathologic compression fracture. No definite dural-based tumor is identified within the thoracic spinal canal. No soft tissue extension of tumor is identified. Thoracic spondylosis as described without significant spinal canal stenosis or neural foraminal narrowing. Electronically Signed   By: Kellie Simmering DO   On: 06/18/2020 16:06   MR Lumbar Spine W Wo Contrast  Result Date: 06/18/2020 CLINICAL DATA:  Metastatic disease evaluation. Metastatic prostate cancer. Back pain. EXAM: MRI LUMBAR SPINE WITHOUT AND WITH CONTRAST TECHNIQUE: Multiplanar and multiecho pulse sequences of the lumbar spine were obtained without and with intravenous contrast. CONTRAST:  7.23mL GADAVIST GADOBUTROL 1 MMOL/ML IV SOLN COMPARISON:  CT abdomen/pelvis 05/27/2020. FINDINGS: Segmentation: 5 lumbar vertebrae. The caudal most well-formed intervertebral disc space is designated  L5-S1. Alignment:  No significant spondylolisthesis. Vertebrae: Multilevel Schmorl nodes. Mild chronic L4 superior endplate compression deformity. Vertebral body height is otherwise maintained. No acute pathologic compression fracture. There is diffuse heterogeneous osseous signal abnormality and patchy enhancement throughout the visualized lumbar spine, sacrum and bilateral iliac bones compatible with diffuse osseous metastatic disease. Conus medullaris and cauda equina: Conus extends to the L2 level. No signal abnormality within the visualized distal spinal cord. No definite dural-based tumor is identified. Paraspinal and other soft tissues: No extraosseous tumor is identified. Specifically, no paraspinal soft tissue mass is appreciated. Disc levels: Multilevel disc degeneration. Most notably, there is moderate to moderately advanced disc degeneration at T12-L1, L1-L2 and L2-L3. T12-L1: Small disc bulge. Mild facet arthrosis. Mild relative spinal canal narrowing. No significant foraminal stenosis. L1-L2: Disc bulge with endplate spurring. Superimposed small left subarticular/foraminal disc protrusion. Mild facet arthrosis. Mild bilateral subarticular and central canal narrowing without appreciable nerve root impingement. Mild left neural foraminal narrowing. L2-L3: Disc bulge with endplate spurring. Moderate facet arthrosis with ligamentum flavum hypertrophy. Moderate subtle subarticular stenosis with potential to affect either descending L3 nerve root. Moderate central canal stenosis. Moderate bilateral neural foraminal narrowing. L3-L4: Disc bulge with endplate spurring. Superimposed left foraminal/extraforaminal disc protrusion at site of posterior annular fissure. Moderate facet arthrosis with slight ligamentum flavum hypertrophy. Mild bilateral subarticular narrowing without appreciable nerve root impingement. Central canal patent. Moderate left neural foraminal narrowing L4-L5: Disc bulge with superimposed  broad-based shallow central disc protrusion. Facet arthrosis (mild right, moderate/advanced left). Ligamentum flavum hypertrophy. Bilateral subarticular narrowing (moderate right, mild left) with potential to affect the descending right L5 nerve root. Mild to moderate central canal narrowing. Moderate bilateral neural foraminal narrowing. L5-S1: Disc bulge with slight endplate spurring. Advanced facet arthrosis with ligamentum flavum  hypertrophy. Prominence of the dorsal epidural fat. Mild bilateral subarticular narrowing without appreciable nerve root impingement. No significant foraminal stenosis. IMPRESSION: Findings compatible with diffuse osseous metastatic disease within the visualized lumbar spine, sacrum and bilateral iliac bones. No definite dural-based tumor is identified within the lumbar spinal canal. No extraosseous tumor extension is identified. Lumbar spondylosis as outlined. Canal stenosis is greatest at L2-L3 (moderate) and L4-L5 (mild/moderate). Potential nerve root impingement within the spinal canal at L2-L3 and L4-L5. Sites of mild and moderate neural foraminal narrowing as detailed. Electronically Signed   By: Kellie Simmering DO   On: 06/18/2020 16:21   [place] Transrectal Complete  Result Date: 06/16/2020 Please see Notes tab for imaging impression.  CT ABDOMEN PELVIS W CONTRAST  Result Date: 05/27/2020 CLINICAL DATA:  Right upper quadrant abdominal pain. Elevated alkaline phosphatase EXAM: CT ABDOMEN AND PELVIS WITH CONTRAST TECHNIQUE: Multidetector CT imaging of the abdomen and pelvis was performed using the standard protocol following bolus administration of intravenous contrast. CONTRAST:  165mL OMNIPAQUE IOHEXOL 300 MG/ML  SOLN COMPARISON:  None. FINDINGS: Lower chest: Bibasilar subsegmental atelectasis. Heart size within normal limits. Hepatobiliary: No focal liver abnormality is seen. No gallstones, gallbladder wall thickening, or biliary dilatation. Pancreas: Unremarkable. No  pancreatic ductal dilatation or surrounding inflammatory changes. Spleen: Normal in size without focal abnormality. Adrenals/Urinary Tract: Unremarkable adrenal glands. Area of cortical scarring within the midpole of the right kidney. Kidneys have otherwise symmetric enhancement. No renal lesion, stone, or hydronephrosis. Urinary bladder is within normal limits. Stomach/Bowel: Stomach appears within normal limits. No dilated loops of small bowel. Slightly hyperenhancing appendix with periappendiceal fat stranding (series 2, images 39-46). No focal colonic wall thickening. Vascular/Lymphatic: Fusiform infrarenal abdominal aortic aneurysm measuring 3.5 cm in maximal AP dimension. Extensive calcified and noncalcified atherosclerotic plaque throughout the aorta. No abdominopelvic lymphadenopathy is identified. Reproductive: Heterogeneous although nonenlarged prostate gland. Other: No free fluid. No abdominopelvic fluid collection. No pneumoperitoneum. No abdominal wall hernia. Musculoskeletal: Diffusely patchy sclerotic appearance of the osseous structures. Mild superior endplate compression deformity of L4 with less than 10% vertebral body height loss, age indeterminate. IMPRESSION: 1. Findings suspicious for early acute uncomplicated appendicitis. 2. Diffusely patchy sclerotic appearance of the osseous structures suspicious for osseous metastatic disease. Given patient demographics, metastatic prostate cancer could be potential etiology. Prostate gland is heterogeneous in appearance. Correlate with serum PSA. 3. Mild superior endplate compression deformity of L4 with less than 10% vertebral body height loss, age indeterminate. Correlate for point tenderness. 4. Fusiform infrarenal abdominal aortic aneurysm measuring up to 3.5 cm. Recommend follow-up ultrasound every 2 years. This recommendation follows ACR consensus guidelines: White Paper of the ACR Incidental Findings Committee II on Vascular Findings. J Am Coll  Radiol 2013; 10:789-794. Aortic Atherosclerosis (ICD10-I70.0). Electronically Signed   By: Davina Poke D.O.   On: 05/27/2020 12:58   MR SACRUM SI JOINTS W WO CONTRAST  Result Date: 06/18/2020 CLINICAL DATA:  Metastatic prostate cancer EXAM: MRI PELVIS (SACRUM) WITHOUT AND WITH CONTRAST TECHNIQUE: Multiplanar multisequence MR imaging of the pelvis was performed both before and after administration of intravenous contrast. Sacral protocol utilized. CONTRAST:  7.90mL GADAVIST GADOBUTROL 1 MMOL/ML IV SOLN COMPARISON:  CT pelvis 05/27/2020 FINDINGS: Osseous structures: Findings of diffuse metastatic disease throughout the visualized sacrum and in the adjacent iliac bones and ischia. There is some relative sparing of the coccyx. This manifests is diffusely low T2 signal in the involved regions, as well as patchy enhancement. This matches the diffuse sclerotic appearance with sparing of the  coccyx seen on CT of 05/27/2020. In the sacrum, I do not observe compelling findings of superimposed fracture or substantial extraosseous extension. There is some anterior spurring along the SI joints. Degenerative facet arthropathy at L5-S1 noted. Sacroiliac joints: No erosion or effusion. Presacral soft tissues: There is moderate presacral edema without a discrete mass. Sacral plexus: No discrete impinging lesion is identified along the sacral plexus or sciatic notch. Surrounding tissues: Mild edema signal is present in the iliacus and gluteus medius and minimus musculature in a symmetric manner. There is also some low-level edema in the medial piriformis musculature bilaterally. Visualized portion of the rectum unremarkable. IMPRESSION: 1. Diffuse malignant involvement of the sacrum as well as the adjacent portions of the iliac bones and ischia. Relative sparing of the coccyx. No appreciable fracture or impingement along the sacral plexus or sciatic notch region, although there is some regional edema tracking along adjacent  musculature and in the presacral space as detailed above. Electronically Signed   By: Van Clines M.D.   On: 06/18/2020 15:50   US Venous Img Lower Bilateral (DVT)  Result Date: 06/18/2020 CLINICAL DATA:  Initial evaluation for acute pain for several weeks. EXAM: BILATERAL LOWER EXTREMITY VENOUS DOPPLER ULTRASOUND TECHNIQUE: Gray-scale sonography with graded compression, as well as color Doppler and duplex ultrasound were performed to evaluate the lower extremity deep venous systems from the level of the common femoral vein and including the common femoral, femoral, profunda femoral, popliteal and calf veins including the posterior tibial, peroneal and gastrocnemius veins when visible. The superficial great saphenous vein was also interrogated. Spectral Doppler was utilized to evaluate flow at rest and with distal augmentation maneuvers in the common femoral, femoral and popliteal veins. COMPARISON:  None. FINDINGS: RIGHT LOWER EXTREMITY Common Femoral Vein: No evidence of thrombus. Normal compressibility, respiratory phasicity and response to augmentation. Saphenofemoral Junction: No evidence of thrombus. Normal compressibility and flow on color Doppler imaging. Profunda Femoral Vein: No evidence of thrombus. Normal compressibility and flow on color Doppler imaging. Femoral Vein: No evidence of thrombus. Normal compressibility, respiratory phasicity and response to augmentation. Popliteal Vein: No evidence of thrombus. Normal compressibility, respiratory phasicity and response to augmentation. Calf Veins: No evidence of thrombus. Normal compressibility and flow on color Doppler imaging. Superficial Great Saphenous Vein: No evidence of thrombus. Normal compressibility. Venous Reflux:  None. Other Findings:  None. LEFT LOWER EXTREMITY Common Femoral Vein: No evidence of thrombus. Normal compressibility, respiratory phasicity and response to augmentation. Saphenofemoral Junction: No evidence of thrombus.  Normal compressibility and flow on color Doppler imaging. Profunda Femoral Vein: No evidence of thrombus. Normal compressibility and flow on color Doppler imaging. Femoral Vein: No evidence of thrombus. Normal compressibility, respiratory phasicity and response to augmentation. Popliteal Vein: No evidence of thrombus. Normal compressibility, respiratory phasicity and response to augmentation. Calf Veins: No evidence of thrombus. Normal compressibility and flow on color Doppler imaging. Superficial Great Saphenous Vein: No evidence of thrombus. Normal compressibility. Venous Reflux:  None. Other Findings:  None. IMPRESSION: No evidence of deep venous thrombosis in either lower extremity. Electronically Signed   By: Jeannine Boga M.D.   On: 06/18/2020 19:01   DG Chest Portable 1 View  Result Date: 05/27/2020 CLINICAL DATA:  Fever and myalgia EXAM: PORTABLE CHEST 1 VIEW COMPARISON:  None. FINDINGS: Normal heart size and mediastinal contours. No acute infiltrate or edema. No effusion or pneumothorax. No acute osseous findings. Remote left distal clavicle fracture with hypertrophic attempted healing. IMPRESSION: Negative for pneumonia. Electronically Signed   By:  Monte Fantasia M.D.   On: 05/27/2020 09:52   [place] PROSTATE BIOPSY MULTIPLE  Result Date: 06/16/2020 Please see Notes tab for imaging impression.   Microbiology: Recent Results (from the past 240 hour(s))  Microscopic Examination     Status: None   Collection Time: 06/14/20  3:03 PM   Urine  Result Value Ref Range Status   WBC, UA None seen 0 - 5 /hpf Final   RBC None seen 0 - 2 /hpf Final   Epithelial Cells (non renal) 0-10 0 - 10 /hpf Final   Bacteria, UA None seen None seen/Few Final  SARS CORONAVIRUS 2 (TAT 6-24 HRS) Nasopharyngeal Nasopharyngeal Swab     Status: None   Collection Time: 06/15/20  8:14 AM   Specimen: Nasopharyngeal Swab  Result Value Ref Range Status   SARS Coronavirus 2 NEGATIVE NEGATIVE Final    Comment:  (NOTE) SARS-CoV-2 target nucleic acids are NOT DETECTED.  The SARS-CoV-2 RNA is generally detectable in upper and lower respiratory specimens during the acute phase of infection. Negative results do not preclude SARS-CoV-2 infection, do not rule out co-infections with other pathogens, and should not be used as the sole basis for treatment or other patient management decisions. Negative results must be combined with clinical observations, patient history, and epidemiological information. The expected result is Negative.  Fact Sheet for Patients: SugarRoll.be  Fact Sheet for Healthcare Providers: https://www.woods-mathews.com/  This test is not yet approved or cleared by the [place] FDA and  has been authorized for detection and/or diagnosis of SARS-CoV-2 by FDA under an Emergency Use Authorization (EUA). This EUA will remain  in effect (meaning this test can be used) for the duration of the COVID-19 declaration under Se ction 564(b)(1) of the Act, 21 U.S.C. section 360bbb-3(b)(1), unless the authorization is terminated or revoked sooner.  Performed at [facility], [place] [address], [place], [place] [address]   Urine Culture     Status: None   Collection Time: 06/18/20 12:30 AM   Specimen: Urine, Clean Catch  Result Value Ref Range Status   Specimen Description   Final    URINE, CLEAN CATCH Performed at [facility], [address] [place], [place] [address]    Special Requests   Final    NONE Performed at [facility], [address] [place], [place] [address]    Culture   Final    NO GROWTH Performed at [facility], [place] [address], [place], [place] [address]    Report Status 06/19/2020 FINAL  Final  CULTURE, BLOOD (ROUTINE X 2) w Reflex to ID Panel     Status: None (Preliminary result)   Collection Time: 06/18/20 12:58 AM   Specimen: BLOOD LEFT HAND  Result Value Ref Range Status    Specimen Description BLOOD LEFT HAND  Final   Special Requests   Final    BOTTLES DRAWN AEROBIC AND ANAEROBIC Blood Culture adequate volume   Culture   Final    NO GROWTH 2 DAYS Performed at [facility], [address], [place], [place] [address]    Report Status PENDING  Incomplete  CULTURE, BLOOD (ROUTINE X 2) w Reflex to ID Panel     Status: None (Preliminary result)   Collection Time: 06/18/20 12:58 AM   Specimen: BLOOD LEFT WRIST  Result Value Ref Range Status   Specimen Description BLOOD LEFT WRIST  Final   Special Requests   Final    BOTTLES DRAWN AEROBIC AND  ANAEROBIC Blood Culture adequate volume   Culture   Final    NO GROWTH 2 DAYS Performed at [facility], [place], [place] [address]    Report Status PENDING  Incomplete     Labs: Basic Metabolic Panel: Recent Labs  Lab 06/16/20 1109 06/17/20 0144 06/18/20 0904 06/19/20 0901  NA 138 136 132* 136  K 3.5 3.9 3.8 3.6  CL 104 106 99 104  CO2 24 22 23 24   GLUCOSE 134* 103* 114* 172*  BUN 14 12 13 18   CREATININE 1.05 0.74 0.77 0.71  CALCIUM 8.9 8.6* 7.9* 8.2*  MG  --  1.5*  --   --   PHOS  --  3.6  --   --    Liver Function Tests: Recent Labs  Lab 06/16/20 1109 06/17/20 0144 06/18/20 0904 06/19/20 0901  AST 33 95* 152* 59*  ALT 21 22 19 17   ALKPHOS 862* 1,081* 2,435* 1,779*  BILITOT 0.6 0.5 0.9 0.6  PROT 7.0 5.9* 6.1* 6.4*  ALBUMIN 3.5 2.9* 3.0* 2.9*   No results for input(s): LIPASE, AMYLASE in the last 168 hours. No results for input(s): AMMONIA in the last 168 hours. CBC: Recent Labs  Lab 06/16/20 1109 06/16/20 1638 06/17/20 0144 06/17/20 0705 06/17/20 1309 06/18/20 0904 06/19/20 0901  WBC 9.8  --  7.3  --   --  7.7 8.8  NEUTROABS  --   --   --   --   --  4.7 6.5  HGB 10.4*   < > 8.2* 8.8* 9.1* 8.7* 9.2*  HCT 31.5*   < > 24.8* 25.7* 26.8* 25.3* 27.0*  MCV 87.7  --  87.3  --   --  86.1 86.5  PLT 244  --  183  --   --  143* 143*   < > = values in this  interval not displayed.   Cardiac Enzymes: Recent Labs  Lab 06/19/20 1223  CKTOTAL 368   BNP: BNP (last 3 results) No results for input(s): BNP in the last 8760 hours.  ProBNP (last 3 results) No results for input(s): PROBNP in the last 8760 hours.  CBG: No results for input(s): GLUCAP in the last 168 hours.     Signed:  Nita Sells MD   [facility] 06/20/2020, 10:08 AM

## 2020-06-22 ENCOUNTER — Other Ambulatory Visit: Payer: Self-pay

## 2020-06-22 ENCOUNTER — Encounter
Admission: RE | Admit: 2020-06-22 | Discharge: 2020-06-22 | Disposition: A | Discharge status: Home / Self Care | Payer: Self-pay | Source: Ambulatory Visit | Attending: Oncology | Admitting: Oncology

## 2020-06-22 DIAGNOSIS — R972 Elevated prostate specific antigen [PSA]: Secondary | ICD-10-CM | POA: Insufficient documentation

## 2020-06-22 DIAGNOSIS — M899 Disorder of bone, unspecified: Secondary | ICD-10-CM | POA: Insufficient documentation

## 2020-06-22 IMAGING — NM NM BONE WHOLE BODY
1 series · 2 of 2 positions shown · non-contrast
Comparison: None

Correlation: CT chest [DATE], CT abdomen and pelvis [DATE]

CLINICAL DATA: Prostate cancer, elevated PSA 678

EXAM:
NUCLEAR MEDICINE WHOLE BODY BONE SCAN
TECHNIQUE: Whole body anterior and posterior images were obtained approximately
3 hours after intravenous injection of radiopharmaceutical.
RADIOPHARMACEUTICALS:  20.179 mCi [WI] MDP IV

[Series 1000: 3 hr wholebody · 2.40mm/px · 2 of 2 frames shown]
[frame 1/2]
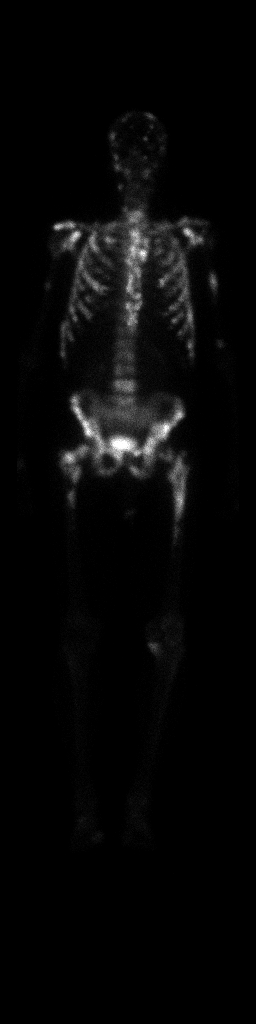
[frame 2/2]
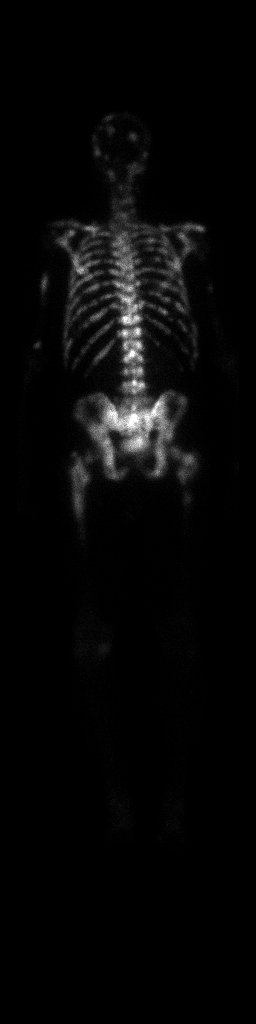

[2 of 2 positions shown; findings below may reference images not displayed]

FINDINGS: Innumerable sites of focal abnormal osseous tracer accumulation are
seen throughout the appendicular and axial skeleton consistent with
widespread osseous metastatic disease. These include calvaria,
cervical/thoracic/lumbar spine, BILATERAL ribs, sternum, and pelvis.

In addition, foci of osseous metastasis are seen at BILATERAL
humeri, BILATERAL scapula, and BILATERAL femora.

Uptake at LEFT medial tibial plateau may be degenerative in origin
though metastasis is not completely excluded.

Small amount of tracer within the urinary bladder but no significant
renal localization of tracer is seen.
IMPRESSION: Widespread osseous metastatic disease involving the appendicular and
axial skeleton as above.

## 2020-06-22 MED ORDER — TECHNETIUM TC 99M MEDRONATE IV KIT
20.0000 | PACK | Freq: Once | INTRAVENOUS | Status: AC | PRN
  Administered 2020-06-22: 20.179 via INTRAVENOUS

## 2020-06-23 ENCOUNTER — Inpatient Hospital Stay (HOSPITAL_BASED_OUTPATIENT_CLINIC_OR_DEPARTMENT_OTHER): Payer: Self-pay | Admitting: Oncology

## 2020-06-23 ENCOUNTER — Encounter: Payer: Self-pay | Admitting: Oncology

## 2020-06-23 ENCOUNTER — Telehealth: Payer: Self-pay | Admitting: *Deleted

## 2020-06-23 ENCOUNTER — Inpatient Hospital Stay: Payer: Self-pay

## 2020-06-23 VITALS — BP 112/62 | HR 90 | Temp 98.3°F | Resp 20 | Wt 164.8 lb

## 2020-06-23 DIAGNOSIS — Z7189 Other specified counseling: Secondary | ICD-10-CM

## 2020-06-23 DIAGNOSIS — C61 Malignant neoplasm of prostate: Secondary | ICD-10-CM

## 2020-06-23 DIAGNOSIS — Z79818 Long term (current) use of other agents affecting estrogen receptors and estrogen levels: Secondary | ICD-10-CM

## 2020-06-23 DIAGNOSIS — M899 Disorder of bone, unspecified: Secondary | ICD-10-CM

## 2020-06-23 DIAGNOSIS — R634 Abnormal weight loss: Secondary | ICD-10-CM

## 2020-06-23 DIAGNOSIS — G893 Neoplasm related pain (acute) (chronic): Secondary | ICD-10-CM

## 2020-06-23 LAB — CULTURE, BLOOD (ROUTINE X 2)
Culture: NO GROWTH
Culture: NO GROWTH
Special Requests: ADEQUATE
Special Requests: ADEQUATE

## 2020-06-23 MED ORDER — LIDOCAINE-PRILOCAINE 2.5-2.5 % EX CREA: TOPICAL_CREAM | CUTANEOUS | 3 refills | Status: DC

## 2020-06-23 MED ORDER — ONDANSETRON HCL 8 MG PO TABS: 8.0000 mg | ORAL_TABLET | Freq: Two times a day (BID) | ORAL | 1 refills | Status: DC | PRN

## 2020-06-23 MED ORDER — OXYCODONE HCL 5 MG PO TABS: 5.0000 mg | ORAL_TABLET | Freq: Four times a day (QID) | ORAL | 0 refills | Status: DC | PRN

## 2020-06-23 MED ORDER — DEXAMETHASONE 4 MG PO TABS: 8.0000 mg | ORAL_TABLET | Freq: Two times a day (BID) | ORAL | 1 refills | Status: DC

## 2020-06-23 MED ORDER — PROCHLORPERAZINE MALEATE 10 MG PO TABS: 10.0000 mg | ORAL_TABLET | Freq: Four times a day (QID) | ORAL | 1 refills | Status: DC | PRN

## 2020-06-23 NOTE — Telephone Encounter (Signed)
Patient's wife called because she was concerned about the 2 medication refill requests from this morning not being called into [facility] yet. Please advise. [phone]

## 2020-06-23 NOTE — Progress Notes (Signed)
START ON PATHWAY REGIMEN - Prostate     A cycle is every 21 days:     Docetaxel   **Always confirm dose/schedule in your pharmacy ordering system**  Patient Characteristics: Adenocarcinoma, Recurrent/New Systemic Disease, Non-Castrate, M1, High Volume Disease Histology: Adenocarcinoma Therapeutic Status: Recurrent/New Systemic Disease Intent of Therapy: Non-Curative / Palliative Intent, Discussed with Patient 

## 2020-06-23 NOTE — Progress Notes (Signed)
Pt states he has been having pain for couple of weeks. C/O chest pain 5/10. Wife states after injections he has been having "chills". Never in the same place. Will like for Dr. Tasia Catchings to check the spots he has his injections.Needs a refill for oxyCODONE and nicotine patches as well. Son passed away last 07/02/2022 while pt was in hospital. Pt is very stressed.

## 2020-06-23 NOTE — H&P (View-Only) (Signed)
Hematology/Oncology Consult note [facility] Telephone:([phone] [phone]   Patient Care Team: Patient, No Pcp Per (Inactive) as PCP - General (General Practice)  REFERRING PROVIDER: Vladimir Crofts, MD  CHIEF COMPLAINTS/REASON FOR VISIT:  Evaluation of bone lesions  HISTORY OF PRESENTING ILLNESS:   Cody Vaughan is a  60 y.o.  male with PMH listed below was seen in consultation at the request of  Vladimir Crofts, MD  for evaluation of bone lesions 05/27/2020 patient presented to emergency room for evaluation of generalized weakness and and body pain.  Prior to the presentations, patient also had history of tooth abscess and was prescribed antibiotics. Reports acute on chronic right-sided lumbar pain without any prior trauma history.  Profound weakness.  Decreased appetite and loss of weight about 10 pounds during the past few months.  Also complained intermittent right upper quadrant discomfort  326 04/10/2020 CT abdomen pelvis showed a diffusely patchy sclerotic appearance of the osseous structures suspicious for osseous metastatic disease.  Prostate gland is heterogeneous in appearance.  Correlate with PSA.  Patient had a mild superior endplate compression deformity of L4 with less than 10% vertebral body height loss.  H indeterminate.  Fusiform infrarenal abdominal aortic aneurysm measuring up to 3.5 cm.  Follow-up in 2 years. Suspicious findings for early acute uncomplicated appendicitis.  Patient denies any right lower quadrant pain.  In the emergency room, he has a negative Murphy sign.  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  Highest Gleason score 5+4 Postprocedure, patient has had hematuria and hematochezia, bilateral lower extremity pain, AKI orthostatic hypovolemia, sepsis and was admitted and treated.  Patient was discharged on a course of antibiotics.  06/18/2020 MRI  thoracic, lumbar, sacrum is compatible with diffuse osseous metastasis in the visualized thoracic lumbar spine, sacrum and bilateral iliac bones.  No definitive dural based tumor is identified.  No compression fracture.  Spine spondylosis  06/20/2019, patient received loading dose of Firmagon 240 mg x 1. #06/22/2020, bone scan showed widespread osseous metastatic disease involving the appendicular and axial skeleton   #INTERVAL HISTORY Cody Vaughan is a 60 y.o. male who has above history reviewed by me today presents for follow up visit for management of metastatic prostate cancer Problems and complaints are listed below: Patient was accompanied by wife.  He reports feeling better than last week.  He takes pain medication 1 to twice daily and the pain is better controlled.  Denies any hematochezia, hematuria. Denies any fever, chills, nausea vomiting diarrhea. Appetite is still not good. He has experienced hot flash Review of Systems  Constitutional: Positive for appetite change, fatigue and unexpected weight change. Negative for chills and fever.       Patient walks independently  HENT:   Negative for hearing loss and voice change.   Eyes: Negative for eye problems and icterus.  Respiratory: Negative for chest tightness, cough and shortness of breath.   Cardiovascular: Negative for chest pain and leg swelling.  Gastrointestinal: Negative for abdominal distention and abdominal pain.  Endocrine: Positive for hot flashes.  Genitourinary: Negative for difficulty urinating, dysuria and frequency.   Musculoskeletal: Positive for back pain. Negative for arthralgias.       Hip pain  Skin: Negative for itching and rash.  Neurological: Negative for light-headedness and numbness.  Hematological: Negative for adenopathy. Does not bruise/bleed easily.  Psychiatric/Behavioral: Negative for confusion.    MEDICAL HISTORY:  Past Medical History:  Diagnosis Date  . Prostate cancer (HCC)    2022     SURGICAL HISTORY: Past Surgical History:  Procedure Laterality Date  . PROSTATE BIOPSY N/A 06/16/2020   Procedure: PROSTATE BIOPSY;  Surgeon: Sninsky, Brian C, MD;  Location: [facility] ORS;  Service: Urology;  Laterality: N/A;  . SHOULDER SURGERY     1985  . TRANSRECTAL ULTRASOUND N/A 06/16/2020   Procedure: TRANSRECTAL ULTRASOUND;  Surgeon: Sninsky, Brian C, MD;  Location: [facility] ORS;  Service: Urology;  Laterality: N/A;    SOCIAL HISTORY: Social History   Socioeconomic History  . Marital status: Married    Spouse name: Not on file  . Number of children: Not on file  . Years of education: Not on file  . Highest education level: Not on file  Occupational History  . Occupation: land scaper    Employer: [employer]  Tobacco Use  . Smoking status: Current Every Day Smoker    Packs/day: 1.00  . Smokeless tobacco: Never Used  Substance and Sexual Activity  . Alcohol use: Not Currently  . Drug use: Never  . Sexual activity: Not Currently  Other Topics Concern  . Not on file  Social History Narrative  . Not on file   Social Determinants of Health   Financial Resource Strain: Not on file  Food Insecurity: Not on file  Transportation Needs: Not on file  Physical Activity: Not on file  Stress: Not on file  Social Connections: Not on file  Intimate Partner Violence: Not on file    FAMILY HISTORY: Family History  Problem Relation Age of Onset  . COPD Mother   . Heart attack Father     ALLERGIES:  has No Known Allergies.  MEDICATIONS:  Current Outpatient Medications  Medication Sig Dispense Refill  . acetaminophen (TYLENOL) 325 MG tablet Take 650 mg by mouth every 6 (six) hours as needed for moderate pain or mild pain.    . ibuprofen (ADVIL) 400 MG tablet Take 1 tablet (400 mg total) by mouth 4 (four) times daily. 30 tablet 0  . Multiple Vitamins-Minerals (MULTIVITAMIN WITH MINERALS) tablet Take 1 tablet by mouth daily.    . oxyCODONE (OXY  IR/ROXICODONE) 5 MG immediate release tablet Take 1 tablet (5 mg total) by mouth every 6 (six) hours as needed for severe pain. 60 tablet 0  . sulfamethoxazole-trimethoprim (BACTRIM DS) 800-160 MG tablet Take 1 tablet by mouth 2 (two) times daily for 10 days. 20 tablet 0  . nicotine (NICODERM CQ - DOSED IN MG/24 HOURS) 21 mg/24hr patch Place 1 patch (21 mg total) onto the skin daily. (Patient not taking: Reported on 06/23/2020) 28 patch 0   No current facility-administered medications for this visit.     PHYSICAL EXAMINATION: ECOG PERFORMANCE STATUS: 1 - Symptomatic but completely ambulatory Vitals:   06/23/20 1004  BP: 112/62  Pulse: 90  Resp: 20  Temp: 98.3 F (36.8 C)  SpO2: 100%   Filed Weights   06/23/20 1004  Weight: 164 lb 12.8 oz (74.8 kg)    Physical Exam Constitutional:      General: He is not in acute distress. HENT:     Head: Normocephalic and atraumatic.  Eyes:     General: No scleral icterus. Cardiovascular:     Rate and Rhythm: Normal rate and regular rhythm.     Heart sounds: Normal heart sounds.  Pulmonary:     Effort: Pulmonary effort is normal. No respiratory distress.     Breath sounds:   No wheezing.  Abdominal:     General: Bowel sounds are normal. There is no distension.     Palpations: Abdomen is soft.  Musculoskeletal:        General: No deformity. Normal range of motion.     Cervical back: Normal range of motion and neck supple.  Skin:    General: Skin is warm and dry.     Findings: No erythema or rash.  Neurological:     Mental Status: He is alert and oriented to person, place, and time. Mental status is at baseline.     Cranial Nerves: No cranial nerve deficit.     Coordination: Coordination normal.  Psychiatric:        Mood and Affect: Mood normal.     LABORATORY DATA:  I have reviewed the data as listed Lab Results  Component Value Date   WBC 8.8 06/19/2020   HGB 9.2 (L) 06/19/2020   HCT 27.0 (L) 06/19/2020   MCV 86.5 06/19/2020    PLT 143 (L) 06/19/2020   Recent Labs    06/17/20 0144 06/18/20 0904 06/19/20 0901  NA 136 132* 136  K 3.9 3.8 3.6  CL 106 99 104  CO2 22 23 24   GLUCOSE 103* 114* 172*  BUN 12 13 18   CREATININE 0.74 0.77 0.71  CALCIUM 8.6* 7.9* 8.2*  GFRNONAA >60 >60 >60  PROT 5.9* 6.1* 6.4*  ALBUMIN 2.9* 3.0* 2.9*  AST 95* 152* 59*  ALT 22 19 17   ALKPHOS 1,081* 2,435* 1,779*  BILITOT 0.5 0.9 0.6   Iron/TIBC/Ferritin/ %Sat No results found for: IRON, TIBC, FERRITIN, IRONPCTSAT    RADIOGRAPHIC STUDIES: I have personally reviewed the radiological images as listed and agreed with the findings in the report. DG Pelvis 1-2 Views  Result Date: 06/18/2020 CLINICAL DATA:  Fall on Friday. History of substance abuse. History of metastatic prostate cancer. EXAM: PELVIS - 1-2 VIEW COMPARISON:  None. FINDINGS: No evidence of acute osseous fracture or dislocation. Heterogeneous mineralization throughout the osseous pelvis, compatible with underlying osseous metastases, better demonstrated on earlier CT abdomen of 05/27/2020. Soft tissues about the pelvis are unremarkable. IMPRESSION: No acute findings. No evidence of acute osseous fracture or dislocation. Osseous metastases, better demonstrated on earlier CT abdomen of 05/27/2020. Electronically Signed   By: Franki Cabot M.D.   On: 06/18/2020 10:04   CT CHEST W CONTRAST  Result Date: 06/16/2020 CLINICAL DATA:  Bone pain with weakness and weight loss for several weeks. Sclerotic osseous lesions on abdominopelvic CT suspicious for metastatic disease. EXAM: CT CHEST WITH CONTRAST TECHNIQUE: Multidetector CT imaging of the chest was performed during intravenous contrast administration. CONTRAST:  63mL OMNIPAQUE IOHEXOL 300 MG/ML  SOLN COMPARISON:  Abdominopelvic CT 05/27/2020. One view chest 05/27/2020. FINDINGS: Cardiovascular: Atherosclerosis of the aorta, great vessels and coronary arteries with mild intimal irregularity of the aortic arch. The ascending aorta  is mildly dilated, having a maximal diameter 4.1 cm. No acute vascular findings are evident. The heart size is normal. Trace pericardial fluid. Mediastinum/Nodes: There are no enlarged mediastinal, hilar or axillary lymph nodes. The thyroid gland, trachea and esophagus demonstrate no significant findings. Lungs/Pleura: No pleural effusion or pneumothorax. Mild centrilobular and paraseptal emphysema with scattered parenchymal scarring. No suspicious pulmonary nodularity. Upper abdomen: The visualized upper abdomen appears stable without significant findings. The gallbladder is contracted. Musculoskeletal/Chest wall: Diffuse ill-defined sclerosis noted throughout the thoracic spine, sternum, ribs and scapula suspicious for blastic metastatic disease. Scattered Schmorl's node formation in the spine without evidence of  acute fracture or epidural tumor. IMPRESSION: 1. Widespread sclerosis throughout the bones of the chest suspicious for osseous metastatic disease. 2. No evidence of extra osseous thoracic metastases or primary neoplasm. 3. Aortic Atherosclerosis (ICD10-I70.0). Mild dilatation of the ascending aorta. Recommend annual imaging followup by CTA or MRA. This recommendation follows 2010 ACCF/AHA/AATS/ACR/ASA/SCA/SCAI/SIR/STS/SVM Guidelines for the Diagnosis and Management of Patients with Thoracic Aortic Disease. Circulation. 2010; [id number] Electronically Signed   By: Richardean Sale M.D.   On: 06/16/2020 08:57   MR THORACIC SPINE W WO CONTRAST  Result Date: 06/18/2020 CLINICAL DATA:  Metastatic disease evaluation. Metastatic prostate cancer with back pain. EXAM: MRI THORACIC WITHOUT AND WITH CONTRAST TECHNIQUE: Multiplanar and multiecho pulse sequences of the thoracic spine were obtained without and with intravenous contrast. CONTRAST:  7.39mL GADAVIST GADOBUTROL 1 MMOL/ML IV SOLN COMPARISON:  CT chest 06/14/2020 FINDINGS: Intermittent motion degradation. Alignment:  No significant spondylolisthesis.  Vertebrae: Multilevel Schmorl nodes. Vertebral body height is otherwise maintained. There is diffuse heterogeneous signal abnormality and patchy corresponding enhancement throughout the visualized thoracic spine and posterior ribs compatible with diffuse osseous metastatic disease. No evidence of pathologic compression fracture. Cord: No spinal cord signal abnormality is identified. No definite dural-based metastatic disease is identified. Paraspinal and other soft tissues: No definite soft tissue extension of tumor is identified. Specifically, no paraspinal masses identified. Disc levels: Mild-to-moderate disc degeneration throughout the thoracic spine. Multilevel shallow disc bulges and facet arthrosis. No significant focal disc herniation, spinal canal stenosis or neural foraminal narrowing. IMPRESSION: Diffuse osseous metastatic disease throughout the visualized thoracic spine and posterior ribs. No pathologic compression fracture. No definite dural-based tumor is identified within the thoracic spinal canal. No soft tissue extension of tumor is identified. Thoracic spondylosis as described without significant spinal canal stenosis or neural foraminal narrowing. Electronically Signed   By: Kellie Simmering DO   On: 06/18/2020 16:06   MR Lumbar Spine W Wo Contrast  Result Date: 06/18/2020 CLINICAL DATA:  Metastatic disease evaluation. Metastatic prostate cancer. Back pain. EXAM: MRI LUMBAR SPINE WITHOUT AND WITH CONTRAST TECHNIQUE: Multiplanar and multiecho pulse sequences of the lumbar spine were obtained without and with intravenous contrast. CONTRAST:  7.77mL GADAVIST GADOBUTROL 1 MMOL/ML IV SOLN COMPARISON:  CT abdomen/pelvis 05/27/2020. FINDINGS: Segmentation: 5 lumbar vertebrae. The caudal most well-formed intervertebral disc space is designated L5-S1. Alignment:  No significant spondylolisthesis. Vertebrae: Multilevel Schmorl nodes. Mild chronic L4 superior endplate compression deformity. Vertebral body  height is otherwise maintained. No acute pathologic compression fracture. There is diffuse heterogeneous osseous signal abnormality and patchy enhancement throughout the visualized lumbar spine, sacrum and bilateral iliac bones compatible with diffuse osseous metastatic disease. Conus medullaris and cauda equina: Conus extends to the L2 level. No signal abnormality within the visualized distal spinal cord. No definite dural-based tumor is identified. Paraspinal and other soft tissues: No extraosseous tumor is identified. Specifically, no paraspinal soft tissue mass is appreciated. Disc levels: Multilevel disc degeneration. Most notably, there is moderate to moderately advanced disc degeneration at T12-L1, L1-L2 and L2-L3. T12-L1: Small disc bulge. Mild facet arthrosis. Mild relative spinal canal narrowing. No significant foraminal stenosis. L1-L2: Disc bulge with endplate spurring. Superimposed small left subarticular/foraminal disc protrusion. Mild facet arthrosis. Mild bilateral subarticular and central canal narrowing without appreciable nerve root impingement. Mild left neural foraminal narrowing. L2-L3: Disc bulge with endplate spurring. Moderate facet arthrosis with ligamentum flavum hypertrophy. Moderate subtle subarticular stenosis with potential to affect either descending L3 nerve root. Moderate central canal stenosis. Moderate bilateral neural foraminal narrowing. L3-L4:  Disc bulge with endplate spurring. Superimposed left foraminal/extraforaminal disc protrusion at site of posterior annular fissure. Moderate facet arthrosis with slight ligamentum flavum hypertrophy. Mild bilateral subarticular narrowing without appreciable nerve root impingement. Central canal patent. Moderate left neural foraminal narrowing L4-L5: Disc bulge with superimposed broad-based shallow central disc protrusion. Facet arthrosis (mild right, moderate/advanced left). Ligamentum flavum hypertrophy. Bilateral subarticular narrowing  (moderate right, mild left) with potential to affect the descending right L5 nerve root. Mild to moderate central canal narrowing. Moderate bilateral neural foraminal narrowing. L5-S1: Disc bulge with slight endplate spurring. Advanced facet arthrosis with ligamentum flavum hypertrophy. Prominence of the dorsal epidural fat. Mild bilateral subarticular narrowing without appreciable nerve root impingement. No significant foraminal stenosis. IMPRESSION: Findings compatible with diffuse osseous metastatic disease within the visualized lumbar spine, sacrum and bilateral iliac bones. No definite dural-based tumor is identified within the lumbar spinal canal. No extraosseous tumor extension is identified. Lumbar spondylosis as outlined. Canal stenosis is greatest at L2-L3 (moderate) and L4-L5 (mild/moderate). Potential nerve root impingement within the spinal canal at L2-L3 and L4-L5. Sites of mild and moderate neural foraminal narrowing as detailed. Electronically Signed   By: Kellie Simmering DO   On: 06/18/2020 16:21   NM Bone Scan Whole Body  Result Date: 06/22/2020 CLINICAL DATA:  Prostate cancer, elevated PSA 678 EXAM: NUCLEAR MEDICINE WHOLE BODY BONE SCAN TECHNIQUE: Whole body anterior and posterior images were obtained approximately 3 hours after intravenous injection of radiopharmaceutical. RADIOPHARMACEUTICALS:  20.179 mCi Technetium-47m MDP IV COMPARISON:  None Correlation: CT chest 06/14/2020, CT abdomen and pelvis 05/27/2020 FINDINGS: Innumerable sites of focal abnormal osseous tracer accumulation are seen throughout the appendicular and axial skeleton consistent with widespread osseous metastatic disease. These include calvaria, cervical/thoracic/lumbar spine, BILATERAL ribs, sternum, and pelvis. In addition, foci of osseous metastasis are seen at BILATERAL humeri, BILATERAL scapula, and BILATERAL femora. Uptake at LEFT medial tibial plateau may be degenerative in origin though metastasis is not completely  excluded. Small amount of tracer within the urinary bladder but no significant renal localization of tracer is seen. IMPRESSION: Widespread osseous metastatic disease involving the appendicular and axial skeleton as above. Electronically Signed   By: Lavonia Dana M.D.   On: 06/22/2020 14:55   [place] Transrectal Complete  Result Date: 06/16/2020 Please see Notes tab for imaging impression.  CT ABDOMEN PELVIS W CONTRAST  Result Date: 05/27/2020 CLINICAL DATA:  Right upper quadrant abdominal pain. Elevated alkaline phosphatase EXAM: CT ABDOMEN AND PELVIS WITH CONTRAST TECHNIQUE: Multidetector CT imaging of the abdomen and pelvis was performed using the standard protocol following bolus administration of intravenous contrast. CONTRAST:  130mL OMNIPAQUE IOHEXOL 300 MG/ML  SOLN COMPARISON:  None. FINDINGS: Lower chest: Bibasilar subsegmental atelectasis. Heart size within normal limits. Hepatobiliary: No focal liver abnormality is seen. No gallstones, gallbladder wall thickening, or biliary dilatation. Pancreas: Unremarkable. No pancreatic ductal dilatation or surrounding inflammatory changes. Spleen: Normal in size without focal abnormality. Adrenals/Urinary Tract: Unremarkable adrenal glands. Area of cortical scarring within the midpole of the right kidney. Kidneys have otherwise symmetric enhancement. No renal lesion, stone, or hydronephrosis. Urinary bladder is within normal limits. Stomach/Bowel: Stomach appears within normal limits. No dilated loops of small bowel. Slightly hyperenhancing appendix with periappendiceal fat stranding (series 2, images 39-46). No focal colonic wall thickening. Vascular/Lymphatic: Fusiform infrarenal abdominal aortic aneurysm measuring 3.5 cm in maximal AP dimension. Extensive calcified and noncalcified atherosclerotic plaque throughout the aorta. No abdominopelvic lymphadenopathy is identified. Reproductive: Heterogeneous although nonenlarged prostate gland. Other: No free fluid. No  abdominopelvic fluid collection. No pneumoperitoneum. No abdominal wall hernia. Musculoskeletal: Diffusely patchy sclerotic appearance of the osseous structures. Mild superior endplate compression deformity of L4 with less than 10% vertebral body height loss, age indeterminate. IMPRESSION: 1. Findings suspicious for early acute uncomplicated appendicitis. 2. Diffusely patchy sclerotic appearance of the osseous structures suspicious for osseous metastatic disease. Given patient demographics, metastatic prostate cancer could be potential etiology. Prostate gland is heterogeneous in appearance. Correlate with serum PSA. 3. Mild superior endplate compression deformity of L4 with less than 10% vertebral body height loss, age indeterminate. Correlate for point tenderness. 4. Fusiform infrarenal abdominal aortic aneurysm measuring up to 3.5 cm. Recommend follow-up ultrasound every 2 years. This recommendation follows ACR consensus guidelines: White Paper of the ACR Incidental Findings Committee II on Vascular Findings. J Am Coll Radiol 2013; 10:789-794. Aortic Atherosclerosis (ICD10-I70.0). Electronically Signed   By: Davina Poke D.O.   On: 05/27/2020 12:58   MR SACRUM SI JOINTS W WO CONTRAST  Result Date: 06/18/2020 CLINICAL DATA:  Metastatic prostate cancer EXAM: MRI PELVIS (SACRUM) WITHOUT AND WITH CONTRAST TECHNIQUE: Multiplanar multisequence MR imaging of the pelvis was performed both before and after administration of intravenous contrast. Sacral protocol utilized. CONTRAST:  7.73mL GADAVIST GADOBUTROL 1 MMOL/ML IV SOLN COMPARISON:  CT pelvis 05/27/2020 FINDINGS: Osseous structures: Findings of diffuse metastatic disease throughout the visualized sacrum and in the adjacent iliac bones and ischia. There is some relative sparing of the coccyx. This manifests is diffusely low T2 signal in the involved regions, as well as patchy enhancement. This matches the diffuse sclerotic appearance with sparing of the coccyx  seen on CT of 05/27/2020. In the sacrum, I do not observe compelling findings of superimposed fracture or substantial extraosseous extension. There is some anterior spurring along the SI joints. Degenerative facet arthropathy at L5-S1 noted. Sacroiliac joints: No erosion or effusion. Presacral soft tissues: There is moderate presacral edema without a discrete mass. Sacral plexus: No discrete impinging lesion is identified along the sacral plexus or sciatic notch. Surrounding tissues: Mild edema signal is present in the iliacus and gluteus medius and minimus musculature in a symmetric manner. There is also some low-level edema in the medial piriformis musculature bilaterally. Visualized portion of the rectum unremarkable. IMPRESSION: 1. Diffuse malignant involvement of the sacrum as well as the adjacent portions of the iliac bones and ischia. Relative sparing of the coccyx. No appreciable fracture or impingement along the sacral plexus or sciatic notch region, although there is some regional edema tracking along adjacent musculature and in the presacral space as detailed above. Electronically Signed   By: Van Clines M.D.   On: 06/18/2020 15:50   US Venous Img Lower Bilateral (DVT)  Result Date: 06/18/2020 CLINICAL DATA:  Initial evaluation for acute pain for several weeks. EXAM: BILATERAL LOWER EXTREMITY VENOUS DOPPLER ULTRASOUND TECHNIQUE: Gray-scale sonography with graded compression, as well as color Doppler and duplex ultrasound were performed to evaluate the lower extremity deep venous systems from the level of the common femoral vein and including the common femoral, femoral, profunda femoral, popliteal and calf veins including the posterior tibial, peroneal and gastrocnemius veins when visible. The superficial great saphenous vein was also interrogated. Spectral Doppler was utilized to evaluate flow at rest and with distal augmentation maneuvers in the common femoral, femoral and popliteal veins.  COMPARISON:  None. FINDINGS: RIGHT LOWER EXTREMITY Common Femoral Vein: No evidence of thrombus. Normal compressibility, respiratory phasicity and response to augmentation. Saphenofemoral Junction: No evidence of thrombus. Normal compressibility and flow  on color Doppler imaging. Profunda Femoral Vein: No evidence of thrombus. Normal compressibility and flow on color Doppler imaging. Femoral Vein: No evidence of thrombus. Normal compressibility, respiratory phasicity and response to augmentation. Popliteal Vein: No evidence of thrombus. Normal compressibility, respiratory phasicity and response to augmentation. Calf Veins: No evidence of thrombus. Normal compressibility and flow on color Doppler imaging. Superficial Great Saphenous Vein: No evidence of thrombus. Normal compressibility. Venous Reflux:  None. Other Findings:  None. LEFT LOWER EXTREMITY Common Femoral Vein: No evidence of thrombus. Normal compressibility, respiratory phasicity and response to augmentation. Saphenofemoral Junction: No evidence of thrombus. Normal compressibility and flow on color Doppler imaging. Profunda Femoral Vein: No evidence of thrombus. Normal compressibility and flow on color Doppler imaging. Femoral Vein: No evidence of thrombus. Normal compressibility, respiratory phasicity and response to augmentation. Popliteal Vein: No evidence of thrombus. Normal compressibility, respiratory phasicity and response to augmentation. Calf Veins: No evidence of thrombus. Normal compressibility and flow on color Doppler imaging. Superficial Great Saphenous Vein: No evidence of thrombus. Normal compressibility. Venous Reflux:  None. Other Findings:  None. IMPRESSION: No evidence of deep venous thrombosis in either lower extremity. Electronically Signed   By: Jeannine Boga M.D.   On: 06/18/2020 19:01   DG Chest Portable 1 View  Result Date: 05/27/2020 CLINICAL DATA:  Fever and myalgia EXAM: PORTABLE CHEST 1 VIEW COMPARISON:  None.  FINDINGS: Normal heart size and mediastinal contours. No acute infiltrate or edema. No effusion or pneumothorax. No acute osseous findings. Remote left distal clavicle fracture with hypertrophic attempted healing. IMPRESSION: Negative for pneumonia. Electronically Signed   By: Monte Fantasia M.D.   On: 05/27/2020 09:52   [place] PROSTATE BIOPSY MULTIPLE  Result Date: 06/16/2020 Please see Notes tab for imaging impression.     ASSESSMENT & PLAN:  1. Prostate cancer ([place])   2. Bone lesion   3. Loss of weight   4. Goals of care, counseling/discussion   5. Androgen deprivation therapy   6. Neoplasm related pain   Cancer Staging Prostate cancer [facility]) Staging form: Prostate, AJCC 8th Edition - Clinical stage from 06/23/2020: Stage IVB (cT2c, cNX, cM1b, PSA: 678, Grade Group: 5) - Signed by Earlie Server, MD on 06/23/2020   #Metastatic prostate cancer Images were independently reviewed by me and discussed. Pathology was reviewed and discussed with patient. 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 Patient has been started on androgen deprivation therapy.  Firmagon 240mg  x 1 06/19/2020  The diagnosis and care plan were discussed with patient in detail.  NCCN guidelines were reviewed and shared with patient.  Patient understands that his condition is not curable.   The goal of treatment which is to palliate disease, disease related symptoms, improve quality of life and hopefully prolong life was highlighted in our discussion.  Chemotherapy education was provided.  We had discussed the composition of chemotherapy regimen, length of chemo cycle, duration of treatment and the time to assess response to treatment.    I explained to the patient the risks and benefits of chemotherapy docetaxel including all but not limited to hair loss, mouth sore, nausea, vomiting, diarrhea, low blood counts, bleeding, neuropathy and risk of life threatening infection and even death, secondary  malignancy etc.  Growth factor would be given as chemotherapy-induced neutropenia to prevent febrile neutropenias. Discussed potential side effect- including but not limited to myalgias/arthralgias- {recommend Claritin for 4 days), hypersensitivity, injection site reactions, inflammation, bleeding, splenic rupture, etc Patient voices understanding and willing to  proceed chemotherapy with G-CSF support..   # Chemotherapy education; Medi- port placement-refer to vascular surgery. Antiemetics-Zofran and Compazine; EMLA cream sent to pharmacy  Supportive care measures are necessary for patient well-being and will be provided as necessary. We spent sufficient time to discuss many aspect of care, questions were answered to patient's satisfaction.  Neoplasm related pain, Recommend oxycodone 5 mg every 6 hours as needed.  Refill sent to pharmacy.  Lack of insurance coverage.  Refer to Education officer, museum.  # Weight loss, cancer associated. Refer to nutrition.  Orders Placed This Encounter  Procedures  . Ambulatory Referral to [facility] Nutrition    Referral Priority:   Routine    Referral Type:   Consultation    Referral Reason:   Specialty Services Required    Number of Visits Requested:   1  . Ambulatory referral to Vascular Surgery    Referral Priority:   Routine    Referral Type:   Surgical    Referral Reason:   Specialty Services Required    Referred to Provider:   Algernon Huxley, MD    Requested Specialty:   Vascular Surgery    Number of Visits Requested:   1    All questions were answered. The patient knows to call the clinic with any problems questions or concerns.  cc Vladimir Crofts, MD    Return of visit:1-2 weeks to start treatment.   Earlie Server, MD, PhD Hematology Oncology [facility] Pager- [phone] 06/23/2020

## 2020-06-23 NOTE — Progress Notes (Signed)
Hematology/Oncology Consult note [facility] [phone] [phone]   Patient Care Team: Patient, No Pcp Per (Inactive) as PCP - General (General Practice)  REFERRING PROVIDER: Vladimir Crofts, MD  CHIEF COMPLAINTS/REASON FOR VISIT:  Evaluation of bone lesions  HISTORY OF PRESENTING ILLNESS:   Cody Vaughan is a  60 y.o.  male with PMH listed below was seen in consultation at the request of  Vladimir Crofts, MD  for evaluation of bone lesions 05/27/2020 patient presented to emergency room for evaluation of generalized weakness and and body pain.  Prior to the presentations, patient also had history of tooth abscess and was prescribed antibiotics. Reports acute on chronic right-sided lumbar pain without any prior trauma history.  Profound weakness.  Decreased appetite and loss of weight about 10 pounds during the past few months.  Also complained intermittent right upper quadrant discomfort  326 04/10/2020 CT abdomen pelvis showed a diffusely patchy sclerotic appearance of the osseous structures suspicious for osseous metastatic disease.  Prostate gland is heterogeneous in appearance.  Correlate with PSA.  Patient had a mild superior endplate compression deformity of L4 with less than 10% vertebral body height loss.  H indeterminate.  Fusiform infrarenal abdominal aortic aneurysm measuring up to 3.5 cm.  Follow-up in 2 years. Suspicious findings for early acute uncomplicated appendicitis.  Patient denies any right lower quadrant pain.  In the emergency room, he has a negative Murphy sign.  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  Highest Gleason score 5+4 Postprocedure, patient has had hematuria and hematochezia, bilateral lower extremity pain, AKI orthostatic hypovolemia, sepsis and was admitted and treated.  Patient was discharged on a course of antibiotics.  06/18/2020 MRI  thoracic, lumbar, sacrum is compatible with diffuse osseous metastasis in the visualized thoracic lumbar spine, sacrum and bilateral iliac bones.  No definitive dural based tumor is identified.  No compression fracture.  Spine spondylosis  06/20/2019, patient received loading dose of Firmagon 240 mg x 1. #06/22/2020, bone scan showed widespread osseous metastatic disease involving the appendicular and axial skeleton   #INTERVAL HISTORY Cody Vaughan is a 60 y.o. male who has above history reviewed by me today presents for follow up visit for management of metastatic prostate cancer Problems and complaints are listed below: Patient was accompanied by wife.  He reports feeling better than last week.  He takes pain medication 1 to twice daily and the pain is better controlled.  Denies any hematochezia, hematuria. Denies any fever, chills, nausea vomiting diarrhea. Appetite is still not good. He has experienced hot flash Review of Systems  Constitutional: Positive for appetite change, fatigue and unexpected weight change. Negative for chills and fever.       Patient walks independently  HENT:   Negative for hearing loss and voice change.   Eyes: Negative for eye problems and icterus.  Respiratory: Negative for chest tightness, cough and shortness of breath.   Cardiovascular: Negative for chest pain and leg swelling.  Gastrointestinal: Negative for abdominal distention and abdominal pain.  Endocrine: Positive for hot flashes.  Genitourinary: Negative for difficulty urinating, dysuria and frequency.   Musculoskeletal: Positive for back pain. Negative for arthralgias.       Hip pain  Skin: Negative for itching and rash.  Neurological: Negative for light-headedness and numbness.  Hematological: Negative for adenopathy. Does not bruise/bleed easily.  Psychiatric/Behavioral: Negative for confusion.    MEDICAL HISTORY:  Past Medical History:  Diagnosis Date  . Prostate cancer ([place])    2022     SURGICAL HISTORY: Past Surgical History:  Procedure Laterality Date  . PROSTATE BIOPSY N/A 06/16/2020   Procedure: PROSTATE BIOPSY;  Surgeon: Billey Co, MD;  Location: [facility] ORS;  Service: Urology;  Laterality: N/A;  . SHOULDER SURGERY     1985  . TRANSRECTAL ULTRASOUND N/A 06/16/2020   Procedure: TRANSRECTAL ULTRASOUND;  Surgeon: Billey Co, MD;  Location: [facility] ORS;  Service: Urology;  Laterality: N/A;    SOCIAL HISTORY: Social History   Socioeconomic History  . Marital status: Married    Spouse name: Not on file  . Number of children: Not on file  . Years of education: Not on file  . Highest education level: Not on file  Occupational History  . 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 Smoking status: Current Every Day Smoker    Packs/day: 1.00  . Smokeless tobacco: Never Used  Substance and Sexual Activity  . Alcohol use: Not Currently  . Drug use: Never  . Sexual activity: Not Currently  Other Topics Concern  . Not on file  Social History Narrative  . Not on file   Social Determinants of Health   Financial Resource Strain: Not on file  Food Insecurity: Not on file  Transportation Needs: Not on file  Physical Activity: Not on file  Stress: Not on file  Social Connections: Not on file  Intimate Partner Violence: Not on file    FAMILY HISTORY: Family History  Problem Relation Age of Onset  . COPD Mother   . Heart attack Father     ALLERGIES:  has No Known Allergies.  MEDICATIONS:  Current Outpatient Medications  Medication Sig Dispense Refill  . acetaminophen (TYLENOL) 325 MG tablet Take 650 mg by mouth every 6 (six) hours as needed for moderate pain or mild pain.    Marland Kitchen ibuprofen (ADVIL) 400 MG tablet Take 1 tablet (400 mg total) by mouth 4 (four) times daily. 30 tablet 0  . Multiple Vitamins-Minerals (MULTIVITAMIN WITH MINERALS) tablet Take 1 tablet by mouth daily.    Marland Kitchen oxyCODONE (OXY  IR/ROXICODONE) 5 MG immediate release tablet Take 1 tablet (5 mg total) by mouth every 6 (six) hours as needed for severe pain. 60 tablet 0  . sulfamethoxazole-trimethoprim (BACTRIM DS) 800-160 MG tablet Take 1 tablet by mouth 2 (two) times daily for 10 days. 20 tablet 0  . nicotine (NICODERM CQ - DOSED IN MG/24 HOURS) 21 mg/24hr patch Place 1 patch (21 mg total) onto the skin daily. (Patient not taking: Reported on 06/23/2020) 28 patch 0   No current facility-administered medications for this visit.     PHYSICAL EXAMINATION: ECOG PERFORMANCE STATUS: 1 - Symptomatic but completely ambulatory Vitals:   06/23/20 1004  BP: 112/62  Pulse: 90  Resp: 20  Temp: 98.3 F (36.8 C)  SpO2: 100%   Filed Weights   06/23/20 1004  Weight: 164 lb 12.8 oz (74.8 kg)    Physical Exam Constitutional:      General: He is not in acute distress. HENT:     Head: Normocephalic and atraumatic.  Eyes:     General: No scleral icterus. Cardiovascular:     Rate and Rhythm: Normal rate and regular rhythm.     Heart sounds: Normal heart sounds.  Pulmonary:     Effort: Pulmonary effort is normal. No respiratory distress.     Breath sounds:  No wheezing.  Abdominal:     General: Bowel sounds are normal. There is no distension.     Palpations: Abdomen is soft.  Musculoskeletal:        General: No deformity. Normal range of motion.     Cervical back: Normal range of motion and neck supple.  Skin:    General: Skin is warm and dry.     Findings: No erythema or rash.  Neurological:     Mental Status: He is alert and oriented to person, place, and time. Mental status is at baseline.     Cranial Nerves: No cranial nerve deficit.     Coordination: Coordination normal.  Psychiatric:        Mood and Affect: Mood normal.     LABORATORY DATA:  I have reviewed the data as listed Lab Results  Component Value Date   WBC 8.8 06/19/2020   HGB 9.2 (L) 06/19/2020   HCT 27.0 (L) 06/19/2020   MCV 86.5 06/19/2020    PLT 143 (L) 06/19/2020   Recent Labs    06/17/20 0144 06/18/20 0904 06/19/20 0901  NA 136 132* 136  K 3.9 3.8 3.6  CL 106 99 104  CO2 22 23 24   GLUCOSE 103* 114* 172*  BUN 12 13 18   CREATININE 0.74 0.77 0.71  CALCIUM 8.6* 7.9* 8.2*  GFRNONAA >60 >60 >60  PROT 5.9* 6.1* 6.4*  ALBUMIN 2.9* 3.0* 2.9*  AST 95* 152* 59*  ALT 22 19 17   ALKPHOS 1,081* 2,435* 1,779*  BILITOT 0.5 0.9 0.6   Iron/TIBC/Ferritin/ %Sat No results found for: IRON, TIBC, FERRITIN, IRONPCTSAT    RADIOGRAPHIC STUDIES: I have personally reviewed the radiological images as listed and agreed with the findings in the report. DG Pelvis 1-2 Views  Result Date: 06/18/2020 CLINICAL DATA:  Fall on Friday. History of substance abuse. History of metastatic prostate cancer. EXAM: PELVIS - 1-2 VIEW COMPARISON:  None. FINDINGS: No evidence of acute osseous fracture or dislocation. Heterogeneous mineralization throughout the osseous pelvis, compatible with underlying osseous metastases, better demonstrated on earlier CT abdomen of 05/27/2020. Soft tissues about the pelvis are unremarkable. IMPRESSION: No acute findings. No evidence of acute osseous fracture or dislocation. Osseous metastases, better demonstrated on earlier CT abdomen of 05/27/2020. Electronically Signed   By: Franki Cabot M.D.   On: 06/18/2020 10:04   CT CHEST W CONTRAST  Result Date: 06/16/2020 CLINICAL DATA:  Bone pain with weakness and weight loss for several weeks. Sclerotic osseous lesions on abdominopelvic CT suspicious for metastatic disease. EXAM: CT CHEST WITH CONTRAST TECHNIQUE: Multidetector CT imaging of the chest was performed during intravenous contrast administration. CONTRAST:  59mL OMNIPAQUE IOHEXOL 300 MG/ML  SOLN COMPARISON:  Abdominopelvic CT 05/27/2020. One view chest 05/27/2020. FINDINGS: Cardiovascular: Atherosclerosis of the aorta, great vessels and coronary arteries with mild intimal irregularity of the aortic arch. The ascending aorta  is mildly dilated, having a maximal diameter 4.1 cm. No acute vascular findings are evident. The heart size is normal. Trace pericardial fluid. Mediastinum/Nodes: There are no enlarged mediastinal, hilar or axillary lymph nodes. The thyroid gland, trachea and esophagus demonstrate no significant findings. Lungs/Pleura: No pleural effusion or pneumothorax. Mild centrilobular and paraseptal emphysema with scattered parenchymal scarring. No suspicious pulmonary nodularity. Upper abdomen: The visualized upper abdomen appears stable without significant findings. The gallbladder is contracted. Musculoskeletal/Chest wall: Diffuse ill-defined sclerosis noted throughout the thoracic spine, sternum, ribs and scapula suspicious for blastic metastatic disease. Scattered Schmorl's node formation in the spine without evidence of  acute fracture or epidural tumor. IMPRESSION: 1. Widespread sclerosis throughout the bones of the chest suspicious for osseous metastatic disease. 2. No evidence of extra osseous thoracic metastases or primary neoplasm. 3. Aortic Atherosclerosis (ICD10-I70.0). Mild dilatation of the ascending aorta. Recommend annual imaging followup by CTA or MRA. This recommendation follows 2010 ACCF/AHA/AATS/ACR/ASA/SCA/SCAI/SIR/STS/SVM Guidelines for the Diagnosis and Management of Patients with Thoracic Aortic Disease. Circulation. 2010; [id number] Electronically Signed   By: Richardean Sale M.D.   On: 06/16/2020 08:57   MR THORACIC SPINE W WO CONTRAST  Result Date: 06/18/2020 CLINICAL DATA:  Metastatic disease evaluation. Metastatic prostate cancer with back pain. EXAM: MRI THORACIC WITHOUT AND WITH CONTRAST TECHNIQUE: Multiplanar and multiecho pulse sequences of the thoracic spine were obtained without and with intravenous contrast. CONTRAST:  7.88mL GADAVIST GADOBUTROL 1 MMOL/ML IV SOLN COMPARISON:  CT chest 06/14/2020 FINDINGS: Intermittent motion degradation. Alignment:  No significant spondylolisthesis.  Vertebrae: Multilevel Schmorl nodes. Vertebral body height is otherwise maintained. There is diffuse heterogeneous signal abnormality and patchy corresponding enhancement throughout the visualized thoracic spine and posterior ribs compatible with diffuse osseous metastatic disease. No evidence of pathologic compression fracture. Cord: No spinal cord signal abnormality is identified. No definite dural-based metastatic disease is identified. Paraspinal and other soft tissues: No definite soft tissue extension of tumor is identified. Specifically, no paraspinal masses identified. Disc levels: Mild-to-moderate disc degeneration throughout the thoracic spine. Multilevel shallow disc bulges and facet arthrosis. No significant focal disc herniation, spinal canal stenosis or neural foraminal narrowing. IMPRESSION: Diffuse osseous metastatic disease throughout the visualized thoracic spine and posterior ribs. No pathologic compression fracture. No definite dural-based tumor is identified within the thoracic spinal canal. No soft tissue extension of tumor is identified. Thoracic spondylosis as described without significant spinal canal stenosis or neural foraminal narrowing. Electronically Signed   By: Kellie Simmering DO   On: 06/18/2020 16:06   MR Lumbar Spine W Wo Contrast  Result Date: 06/18/2020 CLINICAL DATA:  Metastatic disease evaluation. Metastatic prostate cancer. Back pain. EXAM: MRI LUMBAR SPINE WITHOUT AND WITH CONTRAST TECHNIQUE: Multiplanar and multiecho pulse sequences of the lumbar spine were obtained without and with intravenous contrast. CONTRAST:  7.52mL GADAVIST GADOBUTROL 1 MMOL/ML IV SOLN COMPARISON:  CT abdomen/pelvis 05/27/2020. FINDINGS: Segmentation: 5 lumbar vertebrae. The caudal most well-formed intervertebral disc space is designated L5-S1. Alignment:  No significant spondylolisthesis. Vertebrae: Multilevel Schmorl nodes. Mild chronic L4 superior endplate compression deformity. Vertebral body  height is otherwise maintained. No acute pathologic compression fracture. There is diffuse heterogeneous osseous signal abnormality and patchy enhancement throughout the visualized lumbar spine, sacrum and bilateral iliac bones compatible with diffuse osseous metastatic disease. Conus medullaris and cauda equina: Conus extends to the L2 level. No signal abnormality within the visualized distal spinal cord. No definite dural-based tumor is identified. Paraspinal and other soft tissues: No extraosseous tumor is identified. Specifically, no paraspinal soft tissue mass is appreciated. Disc levels: Multilevel disc degeneration. Most notably, there is moderate to moderately advanced disc degeneration at T12-L1, L1-L2 and L2-L3. T12-L1: Small disc bulge. Mild facet arthrosis. Mild relative spinal canal narrowing. No significant foraminal stenosis. L1-L2: Disc bulge with endplate spurring. Superimposed small left subarticular/foraminal disc protrusion. Mild facet arthrosis. Mild bilateral subarticular and central canal narrowing without appreciable nerve root impingement. Mild left neural foraminal narrowing. L2-L3: Disc bulge with endplate spurring. Moderate facet arthrosis with ligamentum flavum hypertrophy. Moderate subtle subarticular stenosis with potential to affect either descending L3 nerve root. Moderate central canal stenosis. Moderate bilateral neural foraminal narrowing. L3-L4:  Disc bulge with endplate spurring. Superimposed left foraminal/extraforaminal disc protrusion at site of posterior annular fissure. Moderate facet arthrosis with slight ligamentum flavum hypertrophy. Mild bilateral subarticular narrowing without appreciable nerve root impingement. Central canal patent. Moderate left neural foraminal narrowing L4-L5: Disc bulge with superimposed broad-based shallow central disc protrusion. Facet arthrosis (mild right, moderate/advanced left). Ligamentum flavum hypertrophy. Bilateral subarticular narrowing  (moderate right, mild left) with potential to affect the descending right L5 nerve root. Mild to moderate central canal narrowing. Moderate bilateral neural foraminal narrowing. L5-S1: Disc bulge with slight endplate spurring. Advanced facet arthrosis with ligamentum flavum hypertrophy. Prominence of the dorsal epidural fat. Mild bilateral subarticular narrowing without appreciable nerve root impingement. No significant foraminal stenosis. IMPRESSION: Findings compatible with diffuse osseous metastatic disease within the visualized lumbar spine, sacrum and bilateral iliac bones. No definite dural-based tumor is identified within the lumbar spinal canal. No extraosseous tumor extension is identified. Lumbar spondylosis as outlined. Canal stenosis is greatest at L2-L3 (moderate) and L4-L5 (mild/moderate). Potential nerve root impingement within the spinal canal at L2-L3 and L4-L5. Sites of mild and moderate neural foraminal narrowing as detailed. Electronically Signed   By: Kellie Simmering DO   On: 06/18/2020 16:21   NM Bone Scan Whole Body  Result Date: 06/22/2020 CLINICAL DATA:  Prostate cancer, elevated PSA 678 EXAM: NUCLEAR MEDICINE WHOLE BODY BONE SCAN TECHNIQUE: Whole body anterior and posterior images were obtained approximately 3 hours after intravenous injection of radiopharmaceutical. RADIOPHARMACEUTICALS:  20.179 mCi Technetium-47m MDP IV COMPARISON:  None Correlation: CT chest 06/14/2020, CT abdomen and pelvis 05/27/2020 FINDINGS: Innumerable sites of focal abnormal osseous tracer accumulation are seen throughout the appendicular and axial skeleton consistent with widespread osseous metastatic disease. These include calvaria, cervical/thoracic/lumbar spine, BILATERAL ribs, sternum, and pelvis. In addition, foci of osseous metastasis are seen at BILATERAL humeri, BILATERAL scapula, and BILATERAL femora. Uptake at LEFT medial tibial plateau may be degenerative in origin though metastasis is not completely  excluded. Small amount of tracer within the urinary bladder but no significant renal localization of tracer is seen. IMPRESSION: Widespread osseous metastatic disease involving the appendicular and axial skeleton as above. Electronically Signed   By: Lavonia Dana M.D.   On: 06/22/2020 14:55   [place] Transrectal Complete  Result Date: 06/16/2020 Please see Notes tab for imaging impression.  CT ABDOMEN PELVIS W CONTRAST  Result Date: 05/27/2020 CLINICAL DATA:  Right upper quadrant abdominal pain. Elevated alkaline phosphatase EXAM: CT ABDOMEN AND PELVIS WITH CONTRAST TECHNIQUE: Multidetector CT imaging of the abdomen and pelvis was performed using the standard protocol following bolus administration of intravenous contrast. CONTRAST:  130mL OMNIPAQUE IOHEXOL 300 MG/ML  SOLN COMPARISON:  None. FINDINGS: Lower chest: Bibasilar subsegmental atelectasis. Heart size within normal limits. Hepatobiliary: No focal liver abnormality is seen. No gallstones, gallbladder wall thickening, or biliary dilatation. Pancreas: Unremarkable. No pancreatic ductal dilatation or surrounding inflammatory changes. Spleen: Normal in size without focal abnormality. Adrenals/Urinary Tract: Unremarkable adrenal glands. Area of cortical scarring within the midpole of the right kidney. Kidneys have otherwise symmetric enhancement. No renal lesion, stone, or hydronephrosis. Urinary bladder is within normal limits. Stomach/Bowel: Stomach appears within normal limits. No dilated loops of small bowel. Slightly hyperenhancing appendix with periappendiceal fat stranding (series 2, images 39-46). No focal colonic wall thickening. Vascular/Lymphatic: Fusiform infrarenal abdominal aortic aneurysm measuring 3.5 cm in maximal AP dimension. Extensive calcified and noncalcified atherosclerotic plaque throughout the aorta. No abdominopelvic lymphadenopathy is identified. Reproductive: Heterogeneous although nonenlarged prostate gland. Other: No free fluid. No  abdominopelvic fluid collection. No pneumoperitoneum. No abdominal wall hernia. Musculoskeletal: Diffusely patchy sclerotic appearance of the osseous structures. Mild superior endplate compression deformity of L4 with less than 10% vertebral body height loss, age indeterminate. IMPRESSION: 1. Findings suspicious for early acute uncomplicated appendicitis. 2. Diffusely patchy sclerotic appearance of the osseous structures suspicious for osseous metastatic disease. Given patient demographics, metastatic prostate cancer could be potential etiology. Prostate gland is heterogeneous in appearance. Correlate with serum PSA. 3. Mild superior endplate compression deformity of L4 with less than 10% vertebral body height loss, age indeterminate. Correlate for point tenderness. 4. Fusiform infrarenal abdominal aortic aneurysm measuring up to 3.5 cm. Recommend follow-up ultrasound every 2 years. This recommendation follows ACR consensus guidelines: White Paper of the ACR Incidental Findings Committee II on Vascular Findings. J Am Coll Radiol 2013; 10:789-794. Aortic Atherosclerosis (ICD10-I70.0). Electronically Signed   By: Davina Poke D.O.   On: 05/27/2020 12:58   MR SACRUM SI JOINTS W WO CONTRAST  Result Date: 06/18/2020 CLINICAL DATA:  Metastatic prostate cancer EXAM: MRI PELVIS (SACRUM) WITHOUT AND WITH CONTRAST TECHNIQUE: Multiplanar multisequence MR imaging of the pelvis was performed both before and after administration of intravenous contrast. Sacral protocol utilized. CONTRAST:  7.54mL GADAVIST GADOBUTROL 1 MMOL/ML IV SOLN COMPARISON:  CT pelvis 05/27/2020 FINDINGS: Osseous structures: Findings of diffuse metastatic disease throughout the visualized sacrum and in the adjacent iliac bones and ischia. There is some relative sparing of the coccyx. This manifests is diffusely low T2 signal in the involved regions, as well as patchy enhancement. This matches the diffuse sclerotic appearance with sparing of the coccyx  seen on CT of 05/27/2020. In the sacrum, I do not observe compelling findings of superimposed fracture or substantial extraosseous extension. There is some anterior spurring along the SI joints. Degenerative facet arthropathy at L5-S1 noted. Sacroiliac joints: No erosion or effusion. Presacral soft tissues: There is moderate presacral edema without a discrete mass. Sacral plexus: No discrete impinging lesion is identified along the sacral plexus or sciatic notch. Surrounding tissues: Mild edema signal is present in the iliacus and gluteus medius and minimus musculature in a symmetric manner. There is also some low-level edema in the medial piriformis musculature bilaterally. Visualized portion of the rectum unremarkable. IMPRESSION: 1. Diffuse malignant involvement of the sacrum as well as the adjacent portions of the iliac bones and ischia. Relative sparing of the coccyx. No appreciable fracture or impingement along the sacral plexus or sciatic notch region, although there is some regional edema tracking along adjacent musculature and in the presacral space as detailed above. Electronically Signed   By: Van Clines M.D.   On: 06/18/2020 15:50   US Venous Img Lower Bilateral (DVT)  Result Date: 06/18/2020 CLINICAL DATA:  Initial evaluation for acute pain for several weeks. EXAM: BILATERAL LOWER EXTREMITY VENOUS DOPPLER ULTRASOUND TECHNIQUE: Gray-scale sonography with graded compression, as well as color Doppler and duplex ultrasound were performed to evaluate the lower extremity deep venous systems from the level of the common femoral vein and including the common femoral, femoral, profunda femoral, popliteal and calf veins including the posterior tibial, peroneal and gastrocnemius veins when visible. The superficial great saphenous vein was also interrogated. Spectral Doppler was utilized to evaluate flow at rest and with distal augmentation maneuvers in the common femoral, femoral and popliteal veins.  COMPARISON:  None. FINDINGS: RIGHT LOWER EXTREMITY Common Femoral Vein: No evidence of thrombus. Normal compressibility, respiratory phasicity and response to augmentation. Saphenofemoral Junction: No evidence of thrombus. Normal compressibility and flow  on color Doppler imaging. Profunda Femoral Vein: No evidence of thrombus. Normal compressibility and flow on color Doppler imaging. Femoral Vein: No evidence of thrombus. Normal compressibility, respiratory phasicity and response to augmentation. Popliteal Vein: No evidence of thrombus. Normal compressibility, respiratory phasicity and response to augmentation. Calf Veins: No evidence of thrombus. Normal compressibility and flow on color Doppler imaging. Superficial Great Saphenous Vein: No evidence of thrombus. Normal compressibility. Venous Reflux:  None. Other Findings:  None. LEFT LOWER EXTREMITY Common Femoral Vein: No evidence of thrombus. Normal compressibility, respiratory phasicity and response to augmentation. Saphenofemoral Junction: No evidence of thrombus. Normal compressibility and flow on color Doppler imaging. Profunda Femoral Vein: No evidence of thrombus. Normal compressibility and flow on color Doppler imaging. Femoral Vein: No evidence of thrombus. Normal compressibility, respiratory phasicity and response to augmentation. Popliteal Vein: No evidence of thrombus. Normal compressibility, respiratory phasicity and response to augmentation. Calf Veins: No evidence of thrombus. Normal compressibility and flow on color Doppler imaging. Superficial Great Saphenous Vein: No evidence of thrombus. Normal compressibility. Venous Reflux:  None. Other Findings:  None. IMPRESSION: No evidence of deep venous thrombosis in either lower extremity. Electronically Signed   By: Jeannine Boga M.D.   On: 06/18/2020 19:01   DG Chest Portable 1 View  Result Date: 05/27/2020 CLINICAL DATA:  Fever and myalgia EXAM: PORTABLE CHEST 1 VIEW COMPARISON:  None.  FINDINGS: Normal heart size and mediastinal contours. No acute infiltrate or edema. No effusion or pneumothorax. No acute osseous findings. Remote left distal clavicle fracture with hypertrophic attempted healing. IMPRESSION: Negative for pneumonia. Electronically Signed   By: Monte Fantasia M.D.   On: 05/27/2020 09:52   [place] PROSTATE BIOPSY MULTIPLE  Result Date: 06/16/2020 Please see Notes tab for imaging impression.     ASSESSMENT & PLAN:  1. Prostate cancer ([place])   2. Bone lesion   3. Loss of weight   4. Goals of care, counseling/discussion   5. Androgen deprivation therapy   6. Neoplasm related pain   Cancer Staging Prostate cancer [facility]) Staging form: Prostate, AJCC 8th Edition - Clinical stage from 06/23/2020: Stage IVB (cT2c, cNX, cM1b, PSA: 678, Grade Group: 5) - Signed by Earlie Server, MD on 06/23/2020   #Metastatic prostate cancer Images were independently reviewed by me and discussed. Pathology was reviewed and discussed with patient. 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 Patient has been started on androgen deprivation therapy.  Firmagon 240mg  x 1 06/19/2020  The diagnosis and care plan were discussed with patient in detail.  NCCN guidelines were reviewed and shared with patient.  Patient understands that his condition is not curable.   The goal of treatment which is to palliate disease, disease related symptoms, improve quality of life and hopefully prolong life was highlighted in our discussion.  Chemotherapy education was provided.  We had discussed the composition of chemotherapy regimen, length of chemo cycle, duration of treatment and the time to assess response to treatment.    I explained to the patient the risks and benefits of chemotherapy docetaxel including all but not limited to hair loss, mouth sore, nausea, vomiting, diarrhea, low blood counts, bleeding, neuropathy and risk of life threatening infection and even death, secondary  malignancy etc.  Growth factor would be given as chemotherapy-induced neutropenia to prevent febrile neutropenias. Discussed potential side effect- including but not limited to myalgias/arthralgias- {recommend Claritin for 4 days), hypersensitivity, injection site reactions, inflammation, bleeding, splenic rupture, etc Patient voices understanding and willing to  proceed chemotherapy with G-CSF support..   # Chemotherapy education; Medi- port placement-refer to vascular surgery. Antiemetics-Zofran and Compazine; EMLA cream sent to pharmacy  Supportive care measures are necessary for patient well-being and will be provided as necessary. We spent sufficient time to discuss many aspect of care, questions were answered to patient's satisfaction.  Neoplasm related pain, Recommend oxycodone 5 mg every 6 hours as needed.  Refill sent to pharmacy.  Lack of insurance coverage.  Refer to Education officer, museum.  # Weight loss, cancer associated. Refer to nutrition.  Orders Placed This Encounter  Procedures  . Ambulatory Referral to [facility] Nutrition    Referral Priority:   Routine    Referral Type:   Consultation    Referral Reason:   Specialty Services Required    Number of Visits Requested:   1  . Ambulatory referral to Vascular Surgery    Referral Priority:   Routine    Referral Type:   Surgical    Referral Reason:   Specialty Services Required    Referred to Provider:   Algernon Huxley, MD    Requested Specialty:   Vascular Surgery    Number of Visits Requested:   1    All questions were answered. The patient knows to call the clinic with any problems questions or concerns.  cc Vladimir Crofts, MD    Return of visit:1-2 weeks to start treatment.   Earlie Server, MD, PhD Hematology Oncology [facility] Pager- [phone] 06/23/2020

## 2020-06-26 ENCOUNTER — Telehealth (INDEPENDENT_AMBULATORY_CARE_PROVIDER_SITE_OTHER): Payer: Self-pay

## 2020-06-26 NOTE — Telephone Encounter (Signed)
Spoke with the patient and spouse the patient is schedule for a port placement on 06/30/20 with a 11:30 am arrival time to the [facility]. Covid testing on 06/28/20 between 8-2 pm at the [facility]. Pre-procedure instructions were discussed and they were asked to write it down, patient's wife stated she would remember.

## 2020-06-27 ENCOUNTER — Inpatient Hospital Stay: Payer: Self-pay

## 2020-06-27 ENCOUNTER — Inpatient Hospital Stay (HOSPITAL_BASED_OUTPATIENT_CLINIC_OR_DEPARTMENT_OTHER): Payer: Self-pay | Admitting: Hospice and Palliative Medicine

## 2020-06-27 ENCOUNTER — Other Ambulatory Visit: Payer: Self-pay

## 2020-06-27 ENCOUNTER — Encounter: Payer: Self-pay | Admitting: Hospice and Palliative Medicine

## 2020-06-27 VITALS — BP 99/61 | HR 72 | Temp 97.0°F | Resp 18 | Wt 163.3 lb

## 2020-06-27 DIAGNOSIS — G893 Neoplasm related pain (acute) (chronic): Secondary | ICD-10-CM

## 2020-06-27 DIAGNOSIS — Z515 Encounter for palliative care: Secondary | ICD-10-CM

## 2020-06-27 DIAGNOSIS — C61 Malignant neoplasm of prostate: Secondary | ICD-10-CM

## 2020-06-27 MED ORDER — MELOXICAM 7.5 MG PO TABS: 7.5000 mg | ORAL_TABLET | Freq: Every day | ORAL | 0 refills | Status: DC

## 2020-06-27 NOTE — Progress Notes (Signed)
Cody Vaughan  [phone] [phone]   Name: Cody Vaughan Date: 06/27/2020 MRN: [id number]  DOB: 1960-11-18  Patient Care Team: Patient, No Pcp Per (Inactive) as PCP - General (General Practice)    REASON FOR CONSULTATION: Cody Vaughan is a 60 y.o. 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  Patient was admitted to hospital 06/16/2020 to 419/22 with presyncope with hematuria, and hematochezia following his prostate biopsy.  He was treated for presumed sepsis.  Patient was referred to palliative care to help address goals and manage ongoing symptoms..   SOCIAL HISTORY:     reports that he has been smoking. He has been smoking about 1.00 pack per day. He has never used smokeless tobacco. He reports previous alcohol use. He reports that he does not use drugs.  Patient is married and lives at home with his wife.  He has two sons.  One son lives in [place] and the other in [place].  Patient owns a lawn care business.  ADVANCE DIRECTIVES:  Not on file  CODE STATUS:   PAST MEDICAL HISTORY: Past Medical History:  Diagnosis Date  . Prostate cancer ([place])    2022    PAST SURGICAL HISTORY:  Past Surgical History:  Procedure Laterality Date  . PROSTATE BIOPSY N/A 06/16/2020   Procedure: PROSTATE BIOPSY;  Surgeon: Cody Co, MD;  Location: [facility] ORS;  Service: Urology;  Laterality: N/A;  . SHOULDER SURGERY     1985  . TRANSRECTAL ULTRASOUND N/A 06/16/2020   Procedure: TRANSRECTAL ULTRASOUND;  Surgeon: Cody Co, MD;  Location: [facility] ORS;  Service: Urology;  Laterality: N/A;    HEMATOLOGY/ONCOLOGY HISTORY:  Oncology History  Prostate cancer ([place])  06/14/2020 Initial Diagnosis   Prostate cancer ([place])   06/23/2020 Cancer Staging   Staging form: Prostate, AJCC 8th Edition - Clinical stage from  06/23/2020: Stage IVB (cT2c, cNX, cM1b, PSA: 678, Grade Group: 5) - Signed by Earlie Server, MD on 06/23/2020 Prostate specific antigen (PSA) range: 20 or greater Histologic grading system: 5 grade system   07/05/2020 -  Chemotherapy    Patient is on Treatment Plan: PROSTATE DOCETAXEL Q21D        ALLERGIES:  has No Known Allergies.  MEDICATIONS:  Current Outpatient Medications  Medication Sig Dispense Refill  . acetaminophen (TYLENOL) 325 MG tablet Take 650 mg by mouth every 6 (six) hours as needed for moderate pain or mild pain.    Marland Kitchen dexamethasone (DECADRON) 4 MG tablet Take 2 tablets (8 mg total) by mouth 2 (two) times daily. Start the day before Taxotere. Then daily after chemo for 2 days. 30 tablet 1  . ibuprofen (ADVIL) 400 MG tablet Take 1 tablet (400 mg total) by mouth 4 (four) times daily. 30 tablet 0  . lidocaine-prilocaine (EMLA) cream Apply to affected area once 30 g 3  . Multiple Vitamins-Minerals (MULTIVITAMIN WITH MINERALS) tablet Take 1 tablet by mouth daily.    . nicotine (NICODERM CQ - DOSED IN MG/24 HOURS) 21 mg/24hr patch Place 1 patch (21 mg total) onto the skin daily. (Patient not taking: Reported on 06/23/2020) 28 patch 0  . ondansetron (ZOFRAN) 8 MG tablet Take 1 tablet (8 mg total) by mouth 2 (two) times daily as needed for refractory nausea / vomiting. 30 tablet 1  . oxyCODONE (OXY IR/ROXICODONE) 5 MG immediate release tablet Take 1 tablet (5 mg  total) by mouth every 6 (six) hours as needed for severe pain. 60 tablet 0  . prochlorperazine (COMPAZINE) 10 MG tablet Take 1 tablet (10 mg total) by mouth every 6 (six) hours as needed (Nausea or vomiting). 30 tablet 1  . sulfamethoxazole-trimethoprim (BACTRIM DS) 800-160 MG tablet Take 1 tablet by mouth 2 (two) times daily for 10 days. 20 tablet 0   No current facility-administered medications for this visit.    VITAL SIGNS: There were no vitals taken for this visit. There were no vitals filed for this visit.  Estimated body  mass index is 22.98 kg/m as calculated from the following:   Height as of 06/16/20: 5' 11"  (1.803 m).   Weight as of 06/23/20: 164 lb 12.8 oz (74.8 kg).  LABS: CBC:    Component Value Date/Time   WBC 8.8 06/19/2020 0901   HGB 9.2 (L) 06/19/2020 0901   HGB 12.6 (L) 09/07/2012 0431   HCT 27.0 (L) 06/19/2020 0901   HCT 36.5 (L) 09/07/2012 0431   PLT 143 (L) 06/19/2020 0901   PLT 288 09/07/2012 0431   MCV 86.5 06/19/2020 0901   MCV 89 09/07/2012 0431   NEUTROABS 6.5 06/19/2020 0901   NEUTROABS 6.2 09/07/2012 0431   LYMPHSABS 1.5 06/19/2020 0901   LYMPHSABS 1.8 09/07/2012 0431   MONOABS 0.4 06/19/2020 0901   MONOABS 1.6 (H) 09/07/2012 0431   EOSABS 0.0 06/19/2020 0901   EOSABS 0.3 09/07/2012 0431   BASOSABS 0.0 06/19/2020 0901   BASOSABS 0.1 09/07/2012 0431   Comprehensive Metabolic Panel:    Component Value Date/Time   NA 136 06/19/2020 0901   NA 142 09/06/2012 0429   K 3.6 06/19/2020 0901   K 3.5 09/06/2012 0429   CL 104 06/19/2020 0901   CL 106 09/06/2012 0429   CO2 24 06/19/2020 0901   CO2 28 09/06/2012 0429   BUN 18 06/19/2020 0901   BUN 9 09/06/2012 0429   CREATININE 0.71 06/19/2020 0901   CREATININE 0.97 09/06/2012 0429   GLUCOSE 172 (H) 06/19/2020 0901   GLUCOSE 105 (H) 09/06/2012 0429   CALCIUM 8.2 (L) 06/19/2020 0901   CALCIUM 8.5 09/06/2012 0429   AST 59 (H) 06/19/2020 0901   AST 20 09/02/2012 0658   ALT 17 06/19/2020 0901   ALT 43 09/02/2012 0658   ALKPHOS 1,779 (H) 06/19/2020 0901   ALKPHOS 71 09/02/2012 0658   BILITOT 0.6 06/19/2020 0901   BILITOT 1.0 09/02/2012 0658   PROT 6.4 (L) 06/19/2020 0901   PROT 5.9 (L) 09/02/2012 0658   ALBUMIN 2.9 (L) 06/19/2020 0901   ALBUMIN 2.6 (L) 09/02/2012 0658    RADIOGRAPHIC STUDIES: DG Pelvis 1-2 Views  Result Date: 06/18/2020 CLINICAL DATA:  Fall on Friday. History of substance abuse. History of metastatic prostate cancer. EXAM: PELVIS - 1-2 VIEW COMPARISON:  None. FINDINGS: No evidence of acute osseous  fracture or dislocation. Heterogeneous mineralization throughout the osseous pelvis, compatible with underlying osseous metastases, better demonstrated on earlier CT abdomen of 05/27/2020. Soft tissues about the pelvis are unremarkable. IMPRESSION: No acute findings. No evidence of acute osseous fracture or dislocation. Osseous metastases, better demonstrated on earlier CT abdomen of 05/27/2020. Electronically Signed   By: Franki Cabot M.D.   On: 06/18/2020 10:04   CT CHEST W CONTRAST  Result Date: 06/16/2020 CLINICAL DATA:  Bone pain with weakness and weight loss for several weeks. Sclerotic osseous lesions on abdominopelvic CT suspicious for metastatic disease. EXAM: CT CHEST WITH CONTRAST TECHNIQUE: Multidetector CT imaging of the  chest was performed during intravenous contrast administration. CONTRAST:  6mL OMNIPAQUE IOHEXOL 300 MG/ML  SOLN COMPARISON:  Abdominopelvic CT 05/27/2020. One view chest 05/27/2020. FINDINGS: Cardiovascular: Atherosclerosis of the aorta, great vessels and coronary arteries with mild intimal irregularity of the aortic arch. The ascending aorta is mildly dilated, having a maximal diameter 4.1 cm. No acute vascular findings are evident. The heart size is normal. Trace pericardial fluid. Mediastinum/Nodes: There are no enlarged mediastinal, hilar or axillary lymph nodes. The thyroid gland, trachea and esophagus demonstrate no significant findings. Lungs/Pleura: No pleural effusion or pneumothorax. Mild centrilobular and paraseptal emphysema with scattered parenchymal scarring. No suspicious pulmonary nodularity. Upper abdomen: The visualized upper abdomen appears stable without significant findings. The gallbladder is contracted. Musculoskeletal/Chest wall: Diffuse ill-defined sclerosis noted throughout the thoracic spine, sternum, ribs and scapula suspicious for blastic metastatic disease. Scattered Schmorl's node formation in the spine without evidence of acute fracture or epidural  tumor. IMPRESSION: 1. Widespread sclerosis throughout the bones of the chest suspicious for osseous metastatic disease. 2. No evidence of extra osseous thoracic metastases or primary neoplasm. 3. Aortic Atherosclerosis (ICD10-I70.0). Mild dilatation of the ascending aorta. Recommend annual imaging followup by CTA or MRA. This recommendation follows 2010 ACCF/AHA/AATS/ACR/ASA/SCA/SCAI/SIR/STS/SVM Guidelines for the Diagnosis and Management of Patients with Thoracic Aortic Disease. Circulation. 2010; 121: W409-B353 Electronically Signed   By: Richardean Sale M.D.   On: 06/16/2020 08:57   MR THORACIC SPINE W WO CONTRAST  Result Date: 06/18/2020 CLINICAL DATA:  Metastatic disease evaluation. Metastatic prostate cancer with back pain. EXAM: MRI THORACIC WITHOUT AND WITH CONTRAST TECHNIQUE: Multiplanar and multiecho pulse sequences of the thoracic spine were obtained without and with intravenous contrast. CONTRAST:  7.9mL GADAVIST GADOBUTROL 1 MMOL/ML IV SOLN COMPARISON:  CT chest 06/14/2020 FINDINGS: Intermittent motion degradation. Alignment:  No significant spondylolisthesis. Vertebrae: Multilevel Schmorl nodes. Vertebral body height is otherwise maintained. There is diffuse heterogeneous signal abnormality and patchy corresponding enhancement throughout the visualized thoracic spine and posterior ribs compatible with diffuse osseous metastatic disease. No evidence of pathologic compression fracture. Cord: No spinal cord signal abnormality is identified. No definite dural-based metastatic disease is identified. Paraspinal and other soft tissues: No definite soft tissue extension of tumor is identified. Specifically, no paraspinal masses identified. Disc levels: Mild-to-moderate disc degeneration throughout the thoracic spine. Multilevel shallow disc bulges and facet arthrosis. No significant focal disc herniation, spinal canal stenosis or neural foraminal narrowing. IMPRESSION: Diffuse osseous metastatic disease  throughout the visualized thoracic spine and posterior ribs. No pathologic compression fracture. No definite dural-based tumor is identified within the thoracic spinal canal. No soft tissue extension of tumor is identified. Thoracic spondylosis as described without significant spinal canal stenosis or neural foraminal narrowing. Electronically Signed   By: Kellie Simmering DO   On: 06/18/2020 16:06   MR Lumbar Spine W Wo Contrast  Result Date: 06/18/2020 CLINICAL DATA:  Metastatic disease evaluation. Metastatic prostate cancer. Back pain. EXAM: MRI LUMBAR SPINE WITHOUT AND WITH CONTRAST TECHNIQUE: Multiplanar and multiecho pulse sequences of the lumbar spine were obtained without and with intravenous contrast. CONTRAST:  7.7mL GADAVIST GADOBUTROL 1 MMOL/ML IV SOLN COMPARISON:  CT abdomen/pelvis 05/27/2020. FINDINGS: Segmentation: 5 lumbar vertebrae. The caudal most well-formed intervertebral disc space is designated L5-S1. Alignment:  No significant spondylolisthesis. Vertebrae: Multilevel Schmorl nodes. Mild chronic L4 superior endplate compression deformity. Vertebral body height is otherwise maintained. No acute pathologic compression fracture. There is diffuse heterogeneous osseous signal abnormality and patchy enhancement throughout the visualized lumbar spine, sacrum and bilateral iliac bones  compatible with diffuse osseous metastatic disease. Conus medullaris and cauda equina: Conus extends to the L2 level. No signal abnormality within the visualized distal spinal cord. No definite dural-based tumor is identified. Paraspinal and other soft tissues: No extraosseous tumor is identified. Specifically, no paraspinal soft tissue mass is appreciated. Disc levels: Multilevel disc degeneration. Most notably, there is moderate to moderately advanced disc degeneration at T12-L1, L1-L2 and L2-L3. T12-L1: Small disc bulge. Mild facet arthrosis. Mild relative spinal canal narrowing. No significant foraminal stenosis.  L1-L2: Disc bulge with endplate spurring. Superimposed small left subarticular/foraminal disc protrusion. Mild facet arthrosis. Mild bilateral subarticular and central canal narrowing without appreciable nerve root impingement. Mild left neural foraminal narrowing. L2-L3: Disc bulge with endplate spurring. Moderate facet arthrosis with ligamentum flavum hypertrophy. Moderate subtle subarticular stenosis with potential to affect either descending L3 nerve root. Moderate central canal stenosis. Moderate bilateral neural foraminal narrowing. L3-L4: Disc bulge with endplate spurring. Superimposed left foraminal/extraforaminal disc protrusion at site of posterior annular fissure. Moderate facet arthrosis with slight ligamentum flavum hypertrophy. Mild bilateral subarticular narrowing without appreciable nerve root impingement. Central canal patent. Moderate left neural foraminal narrowing L4-L5: Disc bulge with superimposed broad-based shallow central disc protrusion. Facet arthrosis (mild right, moderate/advanced left). Ligamentum flavum hypertrophy. Bilateral subarticular narrowing (moderate right, mild left) with potential to affect the descending right L5 nerve root. Mild to moderate central canal narrowing. Moderate bilateral neural foraminal narrowing. L5-S1: Disc bulge with slight endplate spurring. Advanced facet arthrosis with ligamentum flavum hypertrophy. Prominence of the dorsal epidural fat. Mild bilateral subarticular narrowing without appreciable nerve root impingement. No significant foraminal stenosis. IMPRESSION: Findings compatible with diffuse osseous metastatic disease within the visualized lumbar spine, sacrum and bilateral iliac bones. No definite dural-based tumor is identified within the lumbar spinal canal. No extraosseous tumor extension is identified. Lumbar spondylosis as outlined. Canal stenosis is greatest at L2-L3 (moderate) and L4-L5 (mild/moderate). Potential nerve root impingement  within the spinal canal at L2-L3 and L4-L5. Sites of mild and moderate neural foraminal narrowing as detailed. Electronically Signed   By: Kellie Simmering DO   On: 06/18/2020 16:21   NM Bone Scan Whole Body  Result Date: 06/22/2020 CLINICAL DATA:  Prostate cancer, elevated PSA 678 EXAM: NUCLEAR MEDICINE WHOLE BODY BONE SCAN TECHNIQUE: Whole body anterior and posterior images were obtained approximately 3 hours after intravenous injection of radiopharmaceutical. RADIOPHARMACEUTICALS:  20.179 mCi Technetium-52mMDP IV COMPARISON:  None Correlation: CT chest 06/14/2020, CT abdomen and pelvis 05/27/2020 FINDINGS: Innumerable sites of focal abnormal osseous tracer accumulation are seen throughout the appendicular and axial skeleton consistent with widespread osseous metastatic disease. These include calvaria, cervical/thoracic/lumbar spine, BILATERAL ribs, sternum, and pelvis. In addition, foci of osseous metastasis are seen at BILATERAL humeri, BILATERAL scapula, and BILATERAL femora. Uptake at LEFT medial tibial plateau may be degenerative in origin though metastasis is not completely excluded. Small amount of tracer within the urinary bladder but no significant renal localization of tracer is seen. IMPRESSION: Widespread osseous metastatic disease involving the appendicular and axial skeleton as above. Electronically Signed   By: MLavonia DanaM.D.   On: 06/22/2020 14:55   [place]rectal Complete  Result Date: 06/16/2020 Please see Notes tab for imaging impression.  MR SACRUM SI JOINTS W WO CONTRAST  Result Date: 06/18/2020 CLINICAL DATA:  Metastatic prostate cancer EXAM: MRI PELVIS (SACRUM) WITHOUT AND WITH CONTRAST TECHNIQUE: Multiplanar multisequence MR imaging of the pelvis was performed both before and after administration of intravenous contrast. Sacral protocol utilized. CONTRAST:  7.515m  GADAVIST GADOBUTROL 1 MMOL/ML IV SOLN COMPARISON:  CT pelvis 05/27/2020 FINDINGS: Osseous structures: Findings of  diffuse metastatic disease throughout the visualized sacrum and in the adjacent iliac bones and ischia. There is some relative sparing of the coccyx. This manifests is diffusely low T2 signal in the involved regions, as well as patchy enhancement. This matches the diffuse sclerotic appearance with sparing of the coccyx seen on CT of 05/27/2020. In the sacrum, I do not observe compelling findings of superimposed fracture or substantial extraosseous extension. There is some anterior spurring along the SI joints. Degenerative facet arthropathy at L5-S1 noted. Sacroiliac joints: No erosion or effusion. Presacral soft tissues: There is moderate presacral edema without a discrete mass. Sacral plexus: No discrete impinging lesion is identified along the sacral plexus or sciatic notch. Surrounding tissues: Mild edema signal is present in the iliacus and gluteus medius and minimus musculature in a symmetric manner. There is also some low-level edema in the medial piriformis musculature bilaterally. Visualized portion of the rectum unremarkable. IMPRESSION: 1. Diffuse malignant involvement of the sacrum as well as the adjacent portions of the iliac bones and ischia. Relative sparing of the coccyx. No appreciable fracture or impingement along the sacral plexus or sciatic notch region, although there is some regional edema tracking along adjacent musculature and in the presacral space as detailed above. Electronically Signed   By: Van Clines M.D.   On: 06/18/2020 15:50   US Venous Img Lower Bilateral (DVT)  Result Date: 06/18/2020 CLINICAL DATA:  Initial evaluation for acute pain for several weeks. EXAM: BILATERAL LOWER EXTREMITY VENOUS DOPPLER ULTRASOUND TECHNIQUE: Gray-scale sonography with graded compression, as well as color Doppler and duplex ultrasound were performed to evaluate the lower extremity deep venous systems from the level of the common femoral vein and including the common femoral, femoral, profunda  femoral, popliteal and calf veins including the posterior tibial, peroneal and gastrocnemius veins when visible. The superficial great saphenous vein was also interrogated. Spectral Doppler was utilized to evaluate flow at rest and with distal augmentation maneuvers in the common femoral, femoral and popliteal veins. COMPARISON:  None. FINDINGS: RIGHT LOWER EXTREMITY Common Femoral Vein: No evidence of thrombus. Normal compressibility, respiratory phasicity and response to augmentation. Saphenofemoral Junction: No evidence of thrombus. Normal compressibility and flow on color Doppler imaging. Profunda Femoral Vein: No evidence of thrombus. Normal compressibility and flow on color Doppler imaging. Femoral Vein: No evidence of thrombus. Normal compressibility, respiratory phasicity and response to augmentation. Popliteal Vein: No evidence of thrombus. Normal compressibility, respiratory phasicity and response to augmentation. Calf Veins: No evidence of thrombus. Normal compressibility and flow on color Doppler imaging. Superficial Great Saphenous Vein: No evidence of thrombus. Normal compressibility. Venous Reflux:  None. Other Findings:  None. LEFT LOWER EXTREMITY Common Femoral Vein: No evidence of thrombus. Normal compressibility, respiratory phasicity and response to augmentation. Saphenofemoral Junction: No evidence of thrombus. Normal compressibility and flow on color Doppler imaging. Profunda Femoral Vein: No evidence of thrombus. Normal compressibility and flow on color Doppler imaging. Femoral Vein: No evidence of thrombus. Normal compressibility, respiratory phasicity and response to augmentation. Popliteal Vein: No evidence of thrombus. Normal compressibility, respiratory phasicity and response to augmentation. Calf Veins: No evidence of thrombus. Normal compressibility and flow on color Doppler imaging. Superficial Great Saphenous Vein: No evidence of thrombus. Normal compressibility. Venous Reflux:  None.  Other Findings:  None. IMPRESSION: No evidence of deep venous thrombosis in either lower extremity. Electronically Signed   By: Pincus Badder.D.  On: 06/18/2020 19:01   [place] PROSTATE BIOPSY MULTIPLE  Result Date: 06/16/2020 Please see Notes tab for imaging impression.   PERFORMANCE STATUS (ECOG) : 1 - Symptomatic but completely ambulatory  Review of Systems Unless otherwise noted, a complete review of systems is negative.  Physical Exam General: NAD Cardiovascular: regular rate and rhythm Pulmonary: clear ant fields Abdomen: soft, nontender, + bowel sounds GU: no suprapubic tenderness Extremities: no edema, no joint deformities Skin: no rashes Neurological: Grossly nonfocal  IMPRESSION: Post hospital follow-up.  I met with patient and wife.  Patient reports that he is doing well.  He denies any significant changes or concerns.  He is returned to doing some light yard work.  He does continue to endorse bony pain, primarily in sternum and ribs at sites of known metastatic disease.  On average, patient is taking oxycodone 3-4 times a day and finds it is helpful but does not fully alleviate the pain.  Patient asked if there is anything else that could be taken for the pain. Discussed with Dr. Tasia Catchings, will trial patient on meloxicam. Avoid other NSAIDs. Monitor CMP.   Plan is to start chemotherapy next week.  Patient's goals are clearly aligned with treatment.  He wants to maintain positivity and a strong faith in the healing nature of God.  PLAN: -Continue current scope of treatment -Continue oxycodone 5 mg every 6 hours as needed for pain -Start meloxicam 7.5 mg daily -Bowel regimen as needed -Future ACP conversation -Follow-up MyChart visit 1 month  Case and plan discussed with Dr. Tasia Catchings   Patient expressed understanding and was in agreement with this plan. He also understands that He can call the clinic at any time with any questions, concerns, or complaints.     Time  Total: 30 minutes  Visit consisted of counseling and education dealing with the complex and emotionally intense issues of symptom management and palliative care in the setting of serious and potentially life-threatening illness.Greater than 50%  of this time was spent counseling and coordinating care related to the above assessment and plan.  Signed by: Altha Harm, PhD, NP-C

## 2020-06-27 NOTE — Progress Notes (Signed)
Pt complains to pain to upper chest and it makes it hard for him to keep head up. Pt instructed to take Dex the day before treatment, per MD instructions. He is scheduled for port placement on Friday.

## 2020-06-27 NOTE — Progress Notes (Signed)
Nutrition Assessment   Reason for Assessment:  Weight loss   ASSESSMENT: 60 year old male with metastatic prostate cancer.  Patient with history of HCV, tobacco abuse, Etoh/cocaine use.  Planning to start   Met with patient and wife in clinic.  Patient reports declining appetite for the past month or so.  Reports breakfast he sometimes skips. Lunch yesterday was pimento cheese sandwich with boost shake.  Dinner last night was chicken casserole, green pea and pineapple casserole. Wife cooks.  Drinks whole milk and boost shakes.  Had recent tooth abscess but was treated with antibiotics.  Denies that tooth is causing problems eating.  Drinks water, juice.   Patient reports pain being biggest factor effecting appetite.    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 Handout provided Provided high calorie shakes samples to try (ensure complete, boost plus, [employer] shake, orgain) Contact information given   MONITORING, EVALUATION, GOAL: weight trends, intake   Next Visit: to be determined with treatment  Araceli Coufal B. Zenia Resides, [place], [place] Registered Dietitian [phone] (mobile)

## 2020-06-28 ENCOUNTER — Other Ambulatory Visit
Admission: RE | Admit: 2020-06-28 | Discharge: 2020-06-28 | Disposition: A | Discharge status: Home / Self Care | Payer: Self-pay | Source: Ambulatory Visit | Attending: Vascular Surgery | Admitting: Vascular Surgery

## 2020-06-28 ENCOUNTER — Other Ambulatory Visit: Payer: Self-pay

## 2020-06-28 DIAGNOSIS — Z01812 Encounter for preprocedural laboratory examination: Secondary | ICD-10-CM | POA: Insufficient documentation

## 2020-06-28 DIAGNOSIS — U071 COVID-19: Secondary | ICD-10-CM | POA: Insufficient documentation

## 2020-06-28 LAB — SARS CORONAVIRUS 2 (TAT 6-24 HRS): SARS Coronavirus 2: POSITIVE — AB

## 2020-06-28 NOTE — Progress Notes (Signed)
The following Medication: Cody Vaughan is approved for drug replacement program by [employer]. The enrollment period is from 06/28/2020 to 06/28/2021.  Reason for Assistance: Self. ID: [id number] First DOS:07/05/2020. Madalyn Rob, CPhT IV Drug Replacement Specialist  [place] Phone: [phone]

## 2020-06-28 NOTE — Patient Instructions (Signed)
Docetaxel injection What is this medicine? DOCETAXEL (doe se TAX el) is a chemotherapy drug. It targets fast dividing cells, like cancer cells, and causes these cells to die. This medicine is used to treat many types of cancers like breast cancer, certain stomach cancers, head and neck cancer, lung cancer, and prostate cancer. This medicine may be used for other purposes; ask your health care provider or pharmacist if you have questions. COMMON BRAND NAME(S): Docefrez, Taxotere What should I tell my health care provider before I take this medicine? They need to know if you have any of these conditions:  infection (especially a virus infection such as chickenpox, cold sores, or herpes)  liver disease  low blood counts, like low white cell, platelet, or red cell counts  an unusual or allergic reaction to docetaxel, polysorbate 80, other chemotherapy agents, other medicines, foods, dyes, or preservatives  pregnant or trying to get pregnant  breast-feeding How should I use this medicine? This drug is given as an infusion into a vein. It is administered in a hospital or clinic by a specially trained health care professional. Talk to your pediatrician regarding the use of this medicine in children. Special care may be needed. Overdosage: If you think you have taken too much of this medicine contact a poison control center or emergency room at once. NOTE: This medicine is only for you. Do not share this medicine with others. What if I miss a dose? It is important not to miss your dose. Call your doctor or health care professional if you are unable to keep an appointment. What may interact with this medicine? Do not take this medicine with any of the following medications:  live virus vaccines This medicine may also interact with the following medications:  aprepitant  certain antibiotics like erythromycin or clarithromycin  certain antivirals for HIV or hepatitis  certain medicines for  fungal infections like fluconazole, itraconazole, ketoconazole, posaconazole, or voriconazole  cimetidine  ciprofloxacin  conivaptan  cyclosporine  dronedarone  fluvoxamine  grapefruit juice  imatinib  verapamil This list may not describe all possible interactions. Give your health care provider a list of all the medicines, herbs, non-prescription drugs, or dietary supplements you use. Also tell them if you smoke, drink alcohol, or use illegal drugs. Some items may interact with your medicine. What should I watch for while using this medicine? Your condition will be monitored carefully while you are receiving this medicine. You will need important blood work done while you are taking this medicine. Call your doctor or health care professional for advice if you get a fever, chills or sore throat, or other symptoms of a cold or flu. Do not treat yourself. This drug decreases your body's ability to fight infections. Try to avoid being around people who are sick. Some products may contain alcohol. Ask your health care professional if this medicine contains alcohol. Be sure to tell all health care professionals you are taking this medicine. Certain medicines, like metronidazole and disulfiram, can cause an unpleasant reaction when taken with alcohol. The reaction includes flushing, headache, nausea, vomiting, sweating, and increased thirst. The reaction can last from 30 minutes to several hours. You may get drowsy or dizzy. Do not drive, use machinery, or do anything that needs mental alertness until you know how this medicine affects you. Do not stand or sit up quickly, especially if you are an older patient. This reduces the risk of dizzy or fainting spells. Alcohol may interfere with the effect of this  medicine. Talk to your health care professional about your risk of cancer. You may be more at risk for certain types of cancer if you take this medicine. Do not become pregnant while taking  this medicine or for 6 months after stopping it. Women should inform their doctor if they wish to become pregnant or think they might be pregnant. There is a potential for serious side effects to an unborn child. Talk to your health care professional or pharmacist for more information. Do not breast-feed an infant while taking this medicine or for 1 week after stopping it. Males who get this medicine must use a condom during sex with females who can get pregnant. If you get a woman pregnant, the baby could have birth defects. The baby could die before they are born. You will need to continue wearing a condom for 3 months after stopping the medicine. Tell your health care provider right away if your partner becomes pregnant while you are taking this medicine. This may interfere with the ability to father a child. You should talk to your doctor or health care professional if you are concerned about your fertility. What side effects may I notice from receiving this medicine? Side effects that you should report to your doctor or health care professional as soon as possible:  allergic reactions like skin rash, itching or hives, swelling of the face, lips, or tongue  blurred vision  breathing problems  changes in vision  low blood counts - This drug may decrease the number of white blood cells, red blood cells and platelets. You may be at increased risk for infections and bleeding.  nausea and vomiting  pain, redness or irritation at site where injected  pain, tingling, numbness in the hands or feet  redness, blistering, peeling, or loosening of the skin, including inside the mouth  signs of decreased platelets or bleeding - bruising, pinpoint red spots on the skin, black, tarry stools, nosebleeds  signs of decreased red blood cells - unusually weak or tired, fainting spells, lightheadedness  signs of infection - fever or chills, cough, sore throat, pain or difficulty passing urine  swelling  of the ankle, feet, hands Side effects that usually do not require medical attention (report to your doctor or health care professional if they continue or are bothersome):  constipation  diarrhea  fingernail or toenail changes  hair loss  loss of appetite  mouth sores  muscle pain This list may not describe all possible side effects. Call your doctor for medical advice about side effects. You may report side effects to FDA at 1-800-FDA-1088. Where should I keep my medicine? This drug is given in a hospital or clinic and will not be stored at home. NOTE: This sheet is a summary. It may not cover all possible information. If you have questions about this medicine, talk to your doctor, pharmacist, or health care provider.  2021 Elsevier/Gold Standard (2019-01-18 19:50:31)

## 2020-06-29 ENCOUNTER — Telehealth: Payer: Self-pay | Admitting: Oncology

## 2020-06-29 ENCOUNTER — Inpatient Hospital Stay: Payer: Self-pay

## 2020-06-29 NOTE — Telephone Encounter (Signed)
Called patient to make him aware of appointments that have been rescheduled from 5/4 to 5/16 due to testing + for COVID 19. Patient was agreeable to these changes and awaits a call with his new port placement appointment.

## 2020-06-29 NOTE — Telephone Encounter (Signed)
I attempted to contact the patient regarding his port placement and having to reschedule due to being positive for covid and a message was left for a return call.

## 2020-06-30 ENCOUNTER — Other Ambulatory Visit (INDEPENDENT_AMBULATORY_CARE_PROVIDER_SITE_OTHER): Payer: Self-pay | Admitting: Nurse Practitioner

## 2020-06-30 ENCOUNTER — Encounter: Admission: RE | Payer: Self-pay | Source: Ambulatory Visit

## 2020-06-30 ENCOUNTER — Ambulatory Visit: Admission: RE | Admit: 2020-06-30 | Payer: Self-pay | Source: Ambulatory Visit | Admitting: Vascular Surgery

## 2020-06-30 DIAGNOSIS — C61 Malignant neoplasm of prostate: Secondary | ICD-10-CM

## 2020-06-30 SURGERY — PORTA CATH INSERTION
Anesthesia: Moderate Sedation

## 2020-07-03 ENCOUNTER — Telehealth: Payer: Self-pay | Admitting: Internal Medicine

## 2020-07-03 ENCOUNTER — Other Ambulatory Visit: Payer: Self-pay | Admitting: Hospice and Palliative Medicine

## 2020-07-03 ENCOUNTER — Other Ambulatory Visit: Payer: Self-pay | Admitting: *Deleted

## 2020-07-03 DIAGNOSIS — C61 Malignant neoplasm of prostate: Secondary | ICD-10-CM

## 2020-07-03 MED ORDER — MIRTAZAPINE 15 MG PO TABS: 15.0000 mg | ORAL_TABLET | Freq: Every day | ORAL | 2 refills | Status: DC

## 2020-07-03 MED ORDER — OXYCODONE HCL ER 10 MG PO T12A: 10.0000 mg | EXTENDED_RELEASE_TABLET | Freq: Two times a day (BID) | ORAL | 0 refills | Status: DC

## 2020-07-03 MED ORDER — MIRTAZAPINE 7.5 MG PO TABS: 7.5000 mg | ORAL_TABLET | Freq: Every day | ORAL | 0 refills | Status: DC

## 2020-07-03 MED ORDER — OXYCODONE HCL 5 MG PO TABS: 5.0000 mg | ORAL_TABLET | Freq: Four times a day (QID) | ORAL | 0 refills | Status: DC | PRN

## 2020-07-03 MED ORDER — MIRTAZAPINE 30 MG PO TABS: 30.0000 mg | ORAL_TABLET | Freq: Every day | ORAL | 0 refills | Status: DC

## 2020-07-03 NOTE — Telephone Encounter (Signed)
Cody Vaughan got message and has called you back

## 2020-07-03 NOTE — Progress Notes (Addendum)
Had a conversation with patient's wife. She says he is depressed following the loss of his son. Discussed grief counseling. Patient not sleeping well. Will start mirtazapine 15mg  QHS. Patient also having frequent breakthrough pain. Will send Rx for OxyContin 10mg  Q12H. Refill oxycodone.   Wife says patient is interested in [place] treatment. Will send referral for consideration of [facility].

## 2020-07-03 NOTE — Telephone Encounter (Signed)
Wife called re: pain medications. Refilled oxycodone/remeron.   Defer to [place] re: oxycontin script.  GB

## 2020-07-03 NOTE — Addendum Note (Signed)
Addended by: Irean Hong on: 07/03/2020 08:56 PM   Modules accepted: Orders

## 2020-07-03 NOTE — Telephone Encounter (Signed)
Cody Vaughan called reporting that patient is having extreme pain and has been having to taking 2 Oxycodone at a time to help control it. She is requesting a return call from [place] to discuss and she states that he only has 10 pills left Please return her call [phone]

## 2020-07-03 NOTE — Telephone Encounter (Signed)
I called and left a VM

## 2020-07-04 ENCOUNTER — Telehealth: Payer: Self-pay | Admitting: *Deleted

## 2020-07-04 ENCOUNTER — Telehealth: Payer: Self-pay

## 2020-07-04 MED ORDER — OXYCODONE HCL 5 MG PO TABS: 5.0000 mg | ORAL_TABLET | ORAL | 0 refills | Status: DC | PRN

## 2020-07-04 MED ORDER — OXYCODONE HCL ER 10 MG PO T12A: 10.0000 mg | EXTENDED_RELEASE_TABLET | Freq: Two times a day (BID) | ORAL | 0 refills | Status: DC

## 2020-07-04 NOTE — Telephone Encounter (Signed)
Oxycodone prescription cancelled at [facility], I spoke with pharmacist

## 2020-07-04 NOTE — Telephone Encounter (Signed)
Patient prescription was not sent correctly, Dr B sent to Oxycodone in under his old directions, and the Oxycontin was sent to the wrong pharmacy. It should have gone to [facility]. She did report that the patient took half a Mirtazapine and it worked well for him. Please advise regarding the Oxycodone having old directions as you had wanted to change it to 1-2 tabs every 4 hours as needed and what was sent was 1 tabs every 6 hours as needed And please resend Oxycontin to [facility]

## 2020-07-04 NOTE — Telephone Encounter (Signed)
Rx's resent. Please inform pharmacy.

## 2020-07-04 NOTE — Telephone Encounter (Signed)
Called to discuss with patient about COVID-19 symptoms and the use of one of the available treatments for those with mild to moderate Covid symptoms and at a high risk of hospitalization.  Pt appears to qualify for outpatient treatment due to co-morbid conditions and/or a member of an at-risk group in accordance with the FDA Emergency Use Authorization.    Symptom onset: Cough,congestion,fatigue Vaccinated: No Booster? No Immunocompromised? Yes Qualifiers: Prostate cancer NIH Criteria:   Unable to reach pt - Left call back number with wife [phone].  Marcello Moores

## 2020-07-05 ENCOUNTER — Other Ambulatory Visit: Payer: Self-pay

## 2020-07-05 ENCOUNTER — Ambulatory Visit: Payer: Self-pay | Admitting: Oncology

## 2020-07-05 ENCOUNTER — Ambulatory Visit: Payer: Self-pay

## 2020-07-06 ENCOUNTER — Telehealth (INDEPENDENT_AMBULATORY_CARE_PROVIDER_SITE_OTHER): Payer: Self-pay

## 2020-07-06 NOTE — Telephone Encounter (Signed)
Spoke with patient's spouse and he has been rescheduled with Dr. Lucky Cowboy for a port placement on 07/10/20 with a 10:15 am arrival to [facility]. Covid testing not needed. Pre-procedure instructions were discussed and will be mailed.

## 2020-07-07 ENCOUNTER — Telehealth: Payer: Self-pay | Admitting: Hospice and Palliative Medicine

## 2020-07-07 ENCOUNTER — Ambulatory Visit: Payer: Self-pay

## 2020-07-07 NOTE — Telephone Encounter (Signed)
I called and spoke with patient's wife.  She reports that patient's pain is stable on current regimen.  Overall, she says patient is much improved.  His appetite is better.  He is sleeping better.  And his pain is better controlled.  They are anxious to begin chemotherapy, which is currently scheduled for 5/16.

## 2020-07-09 ENCOUNTER — Other Ambulatory Visit (INDEPENDENT_AMBULATORY_CARE_PROVIDER_SITE_OTHER): Payer: Self-pay | Admitting: Nurse Practitioner

## 2020-07-10 ENCOUNTER — Ambulatory Visit
Admission: RE | Admit: 2020-07-10 | Discharge: 2020-07-10 | Disposition: A | Discharge status: Home / Self Care | Payer: Self-pay | Source: Ambulatory Visit | Attending: Vascular Surgery | Admitting: Vascular Surgery

## 2020-07-10 ENCOUNTER — Other Ambulatory Visit: Payer: Self-pay

## 2020-07-10 ENCOUNTER — Encounter: Payer: Self-pay | Admitting: Vascular Surgery

## 2020-07-10 ENCOUNTER — Encounter
Admission: RE | Disposition: A | Discharge status: Home / Self Care | Payer: Self-pay | Source: Ambulatory Visit | Attending: Vascular Surgery

## 2020-07-10 DIAGNOSIS — F17210 Nicotine dependence, cigarettes, uncomplicated: Secondary | ICD-10-CM | POA: Insufficient documentation

## 2020-07-10 DIAGNOSIS — G893 Neoplasm related pain (acute) (chronic): Secondary | ICD-10-CM | POA: Insufficient documentation

## 2020-07-10 DIAGNOSIS — C61 Malignant neoplasm of prostate: Secondary | ICD-10-CM

## 2020-07-10 DIAGNOSIS — C7951 Secondary malignant neoplasm of bone: Secondary | ICD-10-CM | POA: Insufficient documentation

## 2020-07-10 HISTORY — PX: PORTA CATH INSERTION: CATH118285

## 2020-07-10 SURGERY — PORTA CATH INSERTION
Anesthesia: Moderate Sedation

## 2020-07-10 MED ORDER — DIPHENHYDRAMINE HCL 50 MG/ML IJ SOLN: 50.0000 mg | Freq: Once | INTRAMUSCULAR | Status: DC | PRN

## 2020-07-10 MED ORDER — CEFAZOLIN SODIUM-DEXTROSE 2-4 GM/100ML-% IV SOLN
2.0000 g | INTRAVENOUS | Status: AC
  Administered 2020-07-10: 2 g via INTRAVENOUS

## 2020-07-10 MED ORDER — CEFAZOLIN SODIUM-DEXTROSE 2-4 GM/100ML-% IV SOLN: 2.0000 g | Freq: Once | INTRAVENOUS | Status: DC

## 2020-07-10 MED ORDER — FENTANYL CITRATE (PF) 100 MCG/2ML IJ SOLN
INTRAMUSCULAR | Status: DC | PRN
  Administered 2020-07-10: 50 ug via INTRAVENOUS
  Administered 2020-07-10: 25 ug via INTRAVENOUS

## 2020-07-10 MED ORDER — FAMOTIDINE 20 MG PO TABS: 40.0000 mg | ORAL_TABLET | Freq: Once | ORAL | Status: DC | PRN

## 2020-07-10 MED ORDER — SODIUM CHLORIDE 0.9 % IV SOLN
Freq: Once | INTRAVENOUS | Status: DC
  Filled 2020-07-10: qty 2

## 2020-07-10 MED ORDER — MIDAZOLAM HCL 2 MG/ML PO SYRP: 8.0000 mg | ORAL_SOLUTION | Freq: Once | ORAL | Status: DC | PRN

## 2020-07-10 MED ORDER — HYDROMORPHONE HCL 1 MG/ML IJ SOLN: 1.0000 mg | Freq: Once | INTRAMUSCULAR | Status: DC | PRN

## 2020-07-10 MED ORDER — MIDAZOLAM HCL 2 MG/2ML IJ SOLN
INTRAMUSCULAR | Status: DC | PRN
  Administered 2020-07-10: 0.5 mg via INTRAVENOUS
  Administered 2020-07-10: 2 mg via INTRAVENOUS

## 2020-07-10 MED ORDER — CHLORHEXIDINE GLUCONATE CLOTH 2 % EX PADS: 6.0000 | MEDICATED_PAD | Freq: Once | CUTANEOUS | Status: DC

## 2020-07-10 MED ORDER — ONDANSETRON HCL 4 MG/2ML IJ SOLN: 4.0000 mg | Freq: Four times a day (QID) | INTRAMUSCULAR | Status: DC | PRN

## 2020-07-10 MED ORDER — METHYLPREDNISOLONE SODIUM SUCC 125 MG IJ SOLR: 125.0000 mg | Freq: Once | INTRAMUSCULAR | Status: DC | PRN

## 2020-07-10 MED ORDER — MIDAZOLAM HCL 5 MG/5ML IJ SOLN
INTRAMUSCULAR | Status: AC
  Filled 2020-07-10: qty 5

## 2020-07-10 MED ORDER — CHLORHEXIDINE GLUCONATE CLOTH 2 % EX PADS
6.0000 | MEDICATED_PAD | Freq: Every day | CUTANEOUS | Status: DC
  Administered 2020-07-10: 6 via TOPICAL

## 2020-07-10 MED ORDER — FENTANYL CITRATE (PF) 100 MCG/2ML IJ SOLN
INTRAMUSCULAR | Status: AC
  Filled 2020-07-10: qty 2

## 2020-07-10 MED ORDER — SODIUM CHLORIDE 0.9 % IV SOLN: INTRAVENOUS | Status: DC

## 2020-07-10 SURGICAL SUPPLY — 16 items
COVER PROBE U/S 5X48 (MISCELLANEOUS) ×2 IMPLANT
COVER SURGICAL LIGHT HANDLE (MISCELLANEOUS) ×2 IMPLANT
DERMABOND ADVANCED (GAUZE/BANDAGES/DRESSINGS) ×2
DERMABOND ADVANCED .7 DNX12 (GAUZE/BANDAGES/DRESSINGS) ×2 IMPLANT
ELECT REM PT RETURN 9FT ADLT (ELECTROSURGICAL) ×2
ELECTRODE REM PT RTRN 9FT ADLT (ELECTROSURGICAL) ×1 IMPLANT
HANDLE YANKAUER SUCT BULB TIP (MISCELLANEOUS) ×2 IMPLANT
KIT PORT POWER 8FR ISP CVUE (Port) ×2 IMPLANT
PACK ANGIOGRAPHY (CUSTOM PROCEDURE TRAY) ×2 IMPLANT
PENCIL ELECTRO HAND CTR (MISCELLANEOUS) ×2 IMPLANT
SPONGE XRAY 4X4 16PLY STRL (MISCELLANEOUS) ×2 IMPLANT
SUT MNCRL AB 4-0 PS2 18 (SUTURE) ×2 IMPLANT
SUT VIC AB 3-0 SH 27 (SUTURE) ×1
SUT VIC AB 3-0 SH 27X BRD (SUTURE) ×1 IMPLANT
SYR TOOMEY 50ML (SYRINGE) ×2 IMPLANT
TUBING CONNECTING 10 (TUBING) ×4 IMPLANT

## 2020-07-10 NOTE — Interval H&P Note (Signed)
History and Physical Interval Note:  07/10/2020 10:18 AM  Cody Vaughan  has presented today for surgery, with the diagnosis of Porta Cath Placement   Prostate Ca.  The various methods of treatment have been discussed with the patient and family. After consideration of risks, benefits and other options for treatment, the patient has consented to  Procedure(s): PORTA CATH INSERTION (N/A) as a surgical intervention.  The patient's history has been reviewed, patient examined, no change in status, stable for surgery.  I have reviewed the patient's chart and labs.  Questions were answered to the patient's satisfaction.     Leotis Pain

## 2020-07-10 NOTE — Discharge Instructions (Signed)
Implanted Port Insertion, Care After This sheet gives you information about how to care for yourself after your procedure. Your health care provider may also give you more specific instructions. If you have problems or questions, contact your health care provider. What can I expect after the procedure? After the procedure, it is common to have:  Discomfort at the port insertion site.  Bruising on the skin over the port. This should improve over 3-4 days. Follow these instructions at home: Port care  After your port is placed, you will get a manufacturer's information card. The card has information about your port. Keep this card with you at all times.  Take care of the port as told by your health care provider. Ask your health care provider if you or a family member can get training for taking care of the port at home. A home health care nurse may also take care of the port.  Make sure to remember what type of port you have. Incision care  Follow instructions from your health care provider about how to take care of your port insertion site. Make sure you: ? Wash your hands with soap and water before and after you change your bandage (dressing). If soap and water are not available, use hand sanitizer. ? Change your dressing as told by your health care provider. ? Leave stitches (sutures), skin glue, or adhesive strips in place. These skin closures may need to stay in place for 2 weeks or longer. If adhesive strip edges start to loosen and curl up, you may trim the loose edges. Do not remove adhesive strips completely unless your health care provider tells you to do that.  Check your port insertion site every day for signs of infection. Check for: ? Redness, swelling, or pain. ? Fluid or blood. ? Warmth. ? Pus or a bad smell.      Activity  Return to your normal activities as told by your health care provider. Ask your health care provider what activities are safe for you.  Do not  lift anything that is heavier than 10 lb (4.5 kg), or the limit that you are told, until your health care provider says that it is safe. General instructions  Take over-the-counter and prescription medicines only as told by your health care provider.  Do not take baths, swim, or use a hot tub until your health care provider approves. Ask your health care provider if you may take showers. You may only be allowed to take sponge baths.  Do not drive for 24 hours if you were given a sedative during your procedure.  Wear a medical alert bracelet in case of an emergency. This will tell any health care providers that you have a port.  Keep all follow-up visits as told by your health care provider. This is important. Contact a health care provider if:  You cannot flush your port with saline as directed, or you cannot draw blood from the port.  You have a fever or chills.  You have redness, swelling, or pain around your port insertion site.  You have fluid or blood coming from your port insertion site.  Your port insertion site feels warm to the touch.  You have pus or a bad smell coming from the port insertion site. Get help right away if:  You have chest pain or shortness of breath.  You have bleeding from your port that you cannot control. Summary  Take care of the port as told by your   health care provider. Keep the manufacturer's information card with you at all times.  Change your dressing as told by your health care provider.  Contact a health care provider if you have a fever or chills or if you have redness, swelling, or pain around your port insertion site.  Keep all follow-up visits as told by your health care provider. This information is not intended to replace advice given to you by your health care provider. Make sure you discuss any questions you have with your health care provider. Document Revised: 09/16/2017 Document Reviewed: 09/16/2017 Elsevier Patient Education   2021 Elsevier Inc.  Moderate Conscious Sedation, Adult, Care After This sheet gives you information about how to care for yourself after your procedure. Your health care provider may also give you more specific instructions. If you have problems or questions, contact your health care provider. What can I expect after the procedure? After the procedure, it is common to have:  Sleepiness for several hours.  Impaired judgment for several hours.  Difficulty with balance.  Vomiting if you eat too soon. Follow these instructions at home: For the time period you were told by your health care provider:  Rest.  Do not participate in activities where you could fall or become injured.  Do not drive or use machinery.  Do not drink alcohol.  Do not take sleeping pills or medicines that cause drowsiness.  Do not make important decisions or sign legal documents.  Do not take care of children on your own.      Eating and drinking  Follow the diet recommended by your health care provider.  Drink enough fluid to keep your urine pale yellow.  If you vomit: ? Drink water, juice, or soup when you can drink without vomiting. ? Make sure you have little or no nausea before eating solid foods.   General instructions  Take over-the-counter and prescription medicines only as told by your health care provider.  Have a responsible adult stay with you for the time you are told. It is important to have someone help care for you until you are awake and alert.  Do not smoke.  Keep all follow-up visits as told by your health care provider. This is important. Contact a health care provider if:  You are still sleepy or having trouble with balance after 24 hours.  You feel light-headed.  You keep feeling nauseous or you keep vomiting.  You develop a rash.  You have a fever.  You have redness or swelling around the IV site. Get help right away if:  You have trouble breathing.  You have  new-onset confusion at home. Summary  After the procedure, it is common to feel sleepy, have impaired judgment, or feel nauseous if you eat too soon.  Rest after you get home. Know the things you should not do after the procedure.  Follow the diet recommended by your health care provider and drink enough fluid to keep your urine pale yellow.  Get help right away if you have trouble breathing or new-onset confusion at home. This information is not intended to replace advice given to you by your health care provider. Make sure you discuss any questions you have with your health care provider. Document Revised: 06/18/2019 Document Reviewed: 01/14/2019 Elsevier Patient Education  2021 Elsevier Inc.  

## 2020-07-10 NOTE — Op Note (Signed)
      [facility]       Operative Note  Date: 07/10/2020  Preoperative diagnosis:  1. Prostate cancer  Postoperative diagnosis:  Same as above  Procedures: #1. Ultrasound guidance for vascular access to the right internal jugular vein. #2. Fluoroscopic guidance for placement of catheter. #3. Placement of CT compatible Port-A-Cath, right internal jugular vein.  Surgeon: Leotis Pain, MD.   Anesthesia: Local with moderate conscious sedation for approximately 30  minutes using 2.5 mg of Versed and 75 mcg of Fentanyl  Fluoroscopy time: less than 1 minute  Contrast used: 0  Estimated blood loss: 5  Indication for the procedure:  The patient is a 60 y.o.male with stage 4 prostate cancer.  The patient needs a Port-A-Cath for durable venous access, chemotherapy, lab draws, and CT scans. We are asked to place this. Risks and benefits were discussed and informed consent was obtained.  Description of procedure: The patient was brought to the vascular and interventional radiology suite.  Moderate conscious sedation was administered throughout the procedure during a face to face encounter with the patient with my supervision of the RN administering medicines and monitoring the patient's vital signs, pulse oximetry, telemetry and mental status throughout from the start of the procedure until the patient was taken to the recovery room. The right neck chest and shoulder were sterilely prepped and draped, and a sterile surgical field was created. Ultrasound was used to help visualize a patent right internal jugular vein. This was then accessed under direct ultrasound guidance without difficulty with the Seldinger needle and a permanent image was recorded. A J-wire was placed. After skin nick and dilatation, the peel-away sheath was then placed over the wire. I then anesthetized an area under the clavicle approximately 1-2 fingerbreadths. A transverse incision was created and an inferior  pocket was created with electrocautery and blunt dissection. The port was then brought onto the field, placed into the pocket and secured to the chest wall with 2 Prolene sutures. The catheter was connected to the port and tunneled from the subclavicular incision to the access site. Fluoroscopic guidance was then used to cut the catheter to an appropriate length. The catheter was then placed through the peel-away sheath and the peel-away sheath was removed. The catheter tip was parked in excellent location under fluorocoscopic guidance in the cavoatrial junction. The pocket was then irrigated with antibiotic impregnated saline and the wound was closed with a running 3-0 Vicryl and a 4-0 Monocryl. The access incision was closed with a single 4-0 Monocryl. The Huber needle was used to withdraw blood and flush the port with heparinized saline. Dermabond was then placed as a dressing. The patient tolerated the procedure well and was taken to the recovery room in stable condition.   Leotis Pain 07/10/2020 11:58 AM   This note was created with Dragon Medical transcription system. Any errors in dictation are purely unintentional.

## 2020-07-17 ENCOUNTER — Other Ambulatory Visit: Payer: Self-pay

## 2020-07-17 ENCOUNTER — Inpatient Hospital Stay (HOSPITAL_BASED_OUTPATIENT_CLINIC_OR_DEPARTMENT_OTHER): Payer: Self-pay | Admitting: Oncology

## 2020-07-17 ENCOUNTER — Inpatient Hospital Stay: Payer: Self-pay

## 2020-07-17 ENCOUNTER — Encounter: Payer: Self-pay | Admitting: Oncology

## 2020-07-17 ENCOUNTER — Inpatient Hospital Stay: Payer: Self-pay | Attending: Oncology

## 2020-07-17 VITALS — BP 136/88 | HR 90 | Temp 97.0°F | Resp 18

## 2020-07-17 VITALS — BP 134/77 | HR 80 | Temp 97.3°F | Resp 16 | Wt 168.0 lb

## 2020-07-17 DIAGNOSIS — Z5111 Encounter for antineoplastic chemotherapy: Secondary | ICD-10-CM | POA: Insufficient documentation

## 2020-07-17 DIAGNOSIS — C7951 Secondary malignant neoplasm of bone: Secondary | ICD-10-CM

## 2020-07-17 DIAGNOSIS — Z7952 Long term (current) use of systemic steroids: Secondary | ICD-10-CM | POA: Insufficient documentation

## 2020-07-17 DIAGNOSIS — C61 Malignant neoplasm of prostate: Secondary | ICD-10-CM

## 2020-07-17 DIAGNOSIS — D649 Anemia, unspecified: Secondary | ICD-10-CM

## 2020-07-17 DIAGNOSIS — Z79899 Other long term (current) drug therapy: Secondary | ICD-10-CM | POA: Insufficient documentation

## 2020-07-17 DIAGNOSIS — G893 Neoplasm related pain (acute) (chronic): Secondary | ICD-10-CM

## 2020-07-17 DIAGNOSIS — Z5189 Encounter for other specified aftercare: Secondary | ICD-10-CM | POA: Insufficient documentation

## 2020-07-17 DIAGNOSIS — F17210 Nicotine dependence, cigarettes, uncomplicated: Secondary | ICD-10-CM | POA: Insufficient documentation

## 2020-07-17 DIAGNOSIS — I714 Abdominal aortic aneurysm, without rupture: Secondary | ICD-10-CM | POA: Insufficient documentation

## 2020-07-17 DIAGNOSIS — Z79818 Long term (current) use of other agents affecting estrogen receptors and estrogen levels: Secondary | ICD-10-CM

## 2020-07-17 LAB — IRON AND TIBC
Iron: 121 ug/dL (ref 45–182)
Saturation Ratios: 32 % (ref 17.9–39.5)
TIBC: 384 ug/dL (ref 250–450)
UIBC: 263 ug/dL

## 2020-07-17 LAB — CBC WITH DIFFERENTIAL/PLATELET
Abs Immature Granulocytes: 0.22 10*3/uL — ABNORMAL HIGH (ref 0.00–0.07)
Basophils Absolute: 0 10*3/uL (ref 0.0–0.1)
Basophils Relative: 0 %
Eosinophils Absolute: 0 10*3/uL (ref 0.0–0.5)
Eosinophils Relative: 0 %
HCT: 28.8 % — ABNORMAL LOW (ref 39.0–52.0)
Hemoglobin: 9.2 g/dL — ABNORMAL LOW (ref 13.0–17.0)
Immature Granulocytes: 2 %
Lymphocytes Relative: 10 %
Lymphs Abs: 1 10*3/uL (ref 0.7–4.0)
MCH: 29.4 pg (ref 26.0–34.0)
MCHC: 31.9 g/dL (ref 30.0–36.0)
MCV: 92 fL (ref 80.0–100.0)
Monocytes Absolute: 0.7 10*3/uL (ref 0.1–1.0)
Monocytes Relative: 7 %
Neutro Abs: 7.9 10*3/uL — ABNORMAL HIGH (ref 1.7–7.7)
Neutrophils Relative %: 81 %
Platelets: 306 10*3/uL (ref 150–400)
RBC: 3.13 MIL/uL — ABNORMAL LOW (ref 4.22–5.81)
RDW: 19.2 % — ABNORMAL HIGH (ref 11.5–15.5)
WBC: 9.8 10*3/uL (ref 4.0–10.5)
nRBC: 0.2 % (ref 0.0–0.2)

## 2020-07-17 LAB — COMPREHENSIVE METABOLIC PANEL
ALT: 36 U/L (ref 0–44)
AST: 30 U/L (ref 15–41)
Albumin: 3.6 g/dL (ref 3.5–5.0)
Alkaline Phosphatase: 381 U/L — ABNORMAL HIGH (ref 38–126)
Anion gap: 9 (ref 5–15)
BUN: 21 mg/dL (ref 8–23)
CO2: 22 mmol/L (ref 22–32)
Calcium: 8.9 mg/dL (ref 8.9–10.3)
Chloride: 103 mmol/L (ref 98–111)
Creatinine, Ser: 0.78 mg/dL (ref 0.61–1.24)
GFR, Estimated: >60 mL/min (ref >60)
Glucose, Bld: 174 mg/dL — ABNORMAL HIGH (ref 70–99)
Potassium: 3.9 mmol/L (ref 3.5–5.1)
Sodium: 134 mmol/L — ABNORMAL LOW (ref 135–145)
Total Bilirubin: 0.5 mg/dL (ref 0.3–1.2)
Total Protein: 7.2 g/dL (ref 6.5–8.1)

## 2020-07-17 LAB — FOLATE: Folate: 24 ng/mL (ref >5.9)

## 2020-07-17 LAB — RETIC PANEL
Immature Retic Fract: 32.6 % — ABNORMAL HIGH (ref 2.3–15.9)
RBC.: 3.11 MIL/uL — ABNORMAL LOW (ref 4.22–5.81)
Retic Count, Absolute: 122.5 10*3/uL (ref 19.0–186.0)
Retic Ct Pct: 3.9 % — ABNORMAL HIGH (ref 0.4–3.1)
Reticulocyte Hemoglobin: 32.3 pg (ref >27.9)

## 2020-07-17 LAB — PSA: Prostatic Specific Antigen: 2.16 ng/mL (ref 0.00–4.00)

## 2020-07-17 LAB — VITAMIN B12: Vitamin B-12: 367 pg/mL (ref 180–914)

## 2020-07-17 LAB — FERRITIN: Ferritin: 299 ng/mL (ref 24–336)

## 2020-07-17 MED ORDER — SODIUM CHLORIDE 0.9 % IV SOLN
75.0000 mg/m2 | Freq: Once | INTRAVENOUS | Status: AC
  Administered 2020-07-17: 150 mg via INTRAVENOUS
  Filled 2020-07-17: qty 15

## 2020-07-17 MED ORDER — SODIUM CHLORIDE 0.9 % IV SOLN
Freq: Once | INTRAVENOUS | Status: AC
  Filled 2020-07-17: qty 250

## 2020-07-17 MED ORDER — DEGARELIX ACETATE 80 MG ~~LOC~~ SOLR
80.0000 mg | Freq: Once | SUBCUTANEOUS | Status: AC
  Administered 2020-07-17: 80 mg via SUBCUTANEOUS
  Filled 2020-07-17: qty 4

## 2020-07-17 MED ORDER — SODIUM CHLORIDE 0.9 % IV SOLN
10.0000 mg | Freq: Once | INTRAVENOUS | Status: AC
  Administered 2020-07-17: 10 mg via INTRAVENOUS
  Filled 2020-07-17: qty 10

## 2020-07-17 MED ORDER — HEPARIN SOD (PORK) LOCK FLUSH 100 UNIT/ML IV SOLN
500.0000 [IU] | Freq: Once | INTRAVENOUS | Status: AC | PRN
  Administered 2020-07-17: 500 [IU]
  Filled 2020-07-17: qty 5

## 2020-07-17 NOTE — Progress Notes (Signed)
Pt here for follow up and new tx start. Pt reports heavy night sweats. OxyContin has been helping with bone pain.

## 2020-07-17 NOTE — Patient Instructions (Signed)
[facility]  Discharge Instructions: Thank you for choosing [facility] to provide your oncology and hematology care.  If you have a lab appointment with the [facility], please go directly to the [facility] and check in at the registration area.  Wear comfortable clothing and clothing appropriate for easy access to any Portacath or PICC line.   We strive to give you quality time with your provider. You may need to reschedule your appointment if you arrive late (15 or more minutes).  Arriving late affects you and other patients whose appointments are after yours.  Also, if you miss three or more appointments without notifying the office, you may be dismissed from the clinic at the provider's discretion.      For prescription refill requests, have your pharmacy contact our office and allow 72 hours for refills to be completed.    Today you received the following chemotherapy and/or immunotherapy agents : TaxotereDocetaxel injection What is this medicine? DOCETAXEL (doe se TAX el) is a chemotherapy drug. It targets fast dividing cells, like cancer cells, and causes these cells to die. This medicine is used to treat many types of cancers like breast cancer, certain stomach cancers, head and neck cancer, lung cancer, and prostate cancer. This medicine may be used for other purposes; ask your health care provider or pharmacist if you have questions. COMMON BRAND NAME(S): Docefrez, Taxotere What should I tell my health care provider before I take this medicine? They need to know if you have any of these conditions:  infection (especially a virus infection such as chickenpox, cold sores, or herpes)  liver disease  low blood counts, like low white cell, platelet, or red cell counts  an unusual or allergic reaction to docetaxel, polysorbate 80, other chemotherapy agents, other medicines, foods, dyes, or preservatives  pregnant or trying to get  pregnant  breast-feeding How should I use this medicine? This drug is given as an infusion into a vein. It is administered in a hospital or clinic by a specially trained health care professional. Talk to your pediatrician regarding the use of this medicine in children. Special care may be needed. Overdosage: If you think you have taken too much of this medicine contact a poison control center or emergency room at once. NOTE: This medicine is only for you. Do not share this medicine with others. What if I miss a dose? It is important not to miss your dose. Call your doctor or health care professional if you are unable to keep an appointment. What may interact with this medicine? Do not take this medicine with any of the following medications:  live virus vaccines This medicine may also interact with the following medications:  aprepitant  certain antibiotics like erythromycin or clarithromycin  certain antivirals for HIV or hepatitis  certain medicines for fungal infections like fluconazole, itraconazole, ketoconazole, posaconazole, or voriconazole  cimetidine  ciprofloxacin  conivaptan  cyclosporine  dronedarone  fluvoxamine  grapefruit juice  imatinib  verapamil This list may not describe all possible interactions. Give your health care provider a list of all the medicines, herbs, non-prescription drugs, or dietary supplements you use. Also tell them if you smoke, drink alcohol, or use illegal drugs. Some items may interact with your medicine. What should I watch for while using this medicine? Your condition will be monitored carefully while you are receiving this medicine. You will need important blood work done while you are taking this medicine. Call your doctor  or health care professional for advice if you get a fever, chills or sore throat, or other symptoms of a cold or flu. Do not treat yourself. This drug decreases your body's ability to fight infections. Try to  avoid being around people who are sick. Some products may contain alcohol. Ask your health care professional if this medicine contains alcohol. Be sure to tell all health care professionals you are taking this medicine. Certain medicines, like metronidazole and disulfiram, can cause an unpleasant reaction when taken with alcohol. The reaction includes flushing, headache, nausea, vomiting, sweating, and increased thirst. The reaction can last from 30 minutes to several hours. You may get drowsy or dizzy. Do not drive, use machinery, or do anything that needs mental alertness until you know how this medicine affects you. Do not stand or sit up quickly, especially if you are an older patient. This reduces the risk of dizzy or fainting spells. Alcohol may interfere with the effect of this medicine. Talk to your health care professional about your risk of cancer. You may be more at risk for certain types of cancer if you take this medicine. Do not become pregnant while taking this medicine or for 6 months after stopping it. Women should inform their doctor if they wish to become pregnant or think they might be pregnant. There is a potential for serious side effects to an unborn child. Talk to your health care professional or pharmacist for more information. Do not breast-feed an infant while taking this medicine or for 1 week after stopping it. Males who get this medicine must use a condom during sex with females who can get pregnant. If you get a woman pregnant, the baby could have birth defects. The baby could die before they are born. You will need to continue wearing a condom for 3 months after stopping the medicine. Tell your health care provider right away if your partner becomes pregnant while you are taking this medicine. This may interfere with the ability to father a child. You should talk to your doctor or health care professional if you are concerned about your fertility. What side effects may I notice  from receiving this medicine? Side effects that you should report to your doctor or health care professional as soon as possible:  allergic reactions like skin rash, itching or hives, swelling of the face, lips, or tongue  blurred vision  breathing problems  changes in vision  low blood counts - This drug may decrease the number of white blood cells, red blood cells and platelets. You may be at increased risk for infections and bleeding.  nausea and vomiting  pain, redness or irritation at site where injected  pain, tingling, numbness in the hands or feet  redness, blistering, peeling, or loosening of the skin, including inside the mouth  signs of decreased platelets or bleeding - bruising, pinpoint red spots on the skin, black, tarry stools, nosebleeds  signs of decreased red blood cells - unusually weak or tired, fainting spells, lightheadedness  signs of infection - fever or chills, cough, sore throat, pain or difficulty passing urine  swelling of the ankle, feet, hands Side effects that usually do not require medical attention (report to your doctor or health care professional if they continue or are bothersome):  constipation  diarrhea  fingernail or toenail changes  hair loss  loss of appetite  mouth sores  muscle pain This list may not describe all possible side effects. Call your doctor for medical advice about  side effects. You may report side effects to FDA at 1-800-FDA-1088. Where should I keep my medicine? This drug is given in a hospital or clinic and will not be stored at home. NOTE: This sheet is a summary. It may not cover all possible information. If you have questions about this medicine, talk to your doctor, pharmacist, or health care provider.  2021 [employer]/Gold Standard (2019-01-18 19:50:31)     To help prevent nausea and vomiting after your treatment, we encourage you to take your nausea medication as directed.  BELOW ARE SYMPTOMS THAT  SHOULD BE REPORTED IMMEDIATELY: . *FEVER GREATER THAN 100.4 F (38 C) OR HIGHER . *CHILLS OR SWEATING . *NAUSEA AND VOMITING THAT IS NOT CONTROLLED WITH YOUR NAUSEA MEDICATION . *UNUSUAL SHORTNESS OF BREATH . *UNUSUAL BRUISING OR BLEEDING . *URINARY PROBLEMS (pain or burning when urinating, or frequent urination) . *BOWEL PROBLEMS (unusual diarrhea, constipation, pain near the anus) . TENDERNESS IN MOUTH AND THROAT WITH OR WITHOUT PRESENCE OF ULCERS (sore throat, sores in mouth, or a toothache) . UNUSUAL RASH, SWELLING OR PAIN  . UNUSUAL VAGINAL DISCHARGE OR ITCHING   Items with * indicate a potential emergency and should be followed up as soon as possible or go to the Emergency Department if any problems should occur.  Please show the CHEMOTHERAPY ALERT CARD or IMMUNOTHERAPY ALERT CARD at check-in to the Emergency Department and triage nurse.  Should you have questions after your visit or need to cancel or reschedule your appointment, please contact [facility]  [phone] and follow the prompts.  Office hours are 8:00 a.m. to 4:30 p.m. Monday - Friday. Please note that voicemails left after 4:00 p.m. may not be returned until the following business day.  We are closed weekends and major holidays. You have access to a nurse at all times for urgent questions. Please call the main number to the clinic [phone] and follow the prompts.  For any non-urgent questions, you may also contact your provider using MyChart. We now offer e-Visits for anyone 83 and older to request care online for non-urgent symptoms. For details visit [email or web address].   Also download the MyChart app! Go to the app store, search "MyChart", open the app, select [facility], and log in with your MyChart username and password.  Due to Covid, a mask is required upon entering the hospital/clinic. If you do not have a mask, one will be given to you upon arrival. For doctor visits,  patients may have 1 support person aged 52 or older with them. For treatment visits, patients cannot have anyone with them due to current Covid guidelines and our immunocompromised population.

## 2020-07-17 NOTE — Progress Notes (Signed)
Hematology/Oncology Consult note [facility] Telephone:([phone] [phone]   Patient Care Team: Patient, No Pcp Per (Inactive) as PCP - General (General Practice)  REFERRING PROVIDER: Vladimir Crofts, MD  CHIEF COMPLAINTS/REASON FOR VISIT:  Evaluation of bone lesions  HISTORY OF PRESENTING ILLNESS:   Cody Vaughan is a  60 y.o.  male with PMH listed below was seen in consultation at the request of  Vladimir Crofts, MD  for evaluation of bone lesions 05/27/2020 patient presented to emergency room for evaluation of generalized weakness and and body pain.  Prior to the presentations, patient also had history of tooth abscess and was prescribed antibiotics. Reports acute on chronic right-sided lumbar pain without any prior trauma history.  Profound weakness.  Decreased appetite and loss of weight about 10 pounds during the past few months.  Also complained intermittent right upper quadrant discomfort  326 04/10/2020 CT abdomen pelvis showed a diffusely patchy sclerotic appearance of the osseous structures suspicious for osseous metastatic disease.  Prostate gland is heterogeneous in appearance.  Correlate with PSA.  Patient had a mild superior endplate compression deformity of L4 with less than 10% vertebral body height loss.  H indeterminate.  Fusiform infrarenal abdominal aortic aneurysm measuring up to 3.5 cm.  Follow-up in 2 years. Suspicious findings for early acute uncomplicated appendicitis.  Patient denies any right lower quadrant pain.  In the emergency room, he has a negative Murphy sign.  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  Highest Gleason score 5+4 Postprocedure, patient has had hematuria and hematochezia, bilateral lower extremity pain, AKI orthostatic hypovolemia, sepsis and was admitted and treated.  Patient was discharged on a course of antibiotics.  06/18/2020 MRI  thoracic, lumbar, sacrum is compatible with diffuse osseous metastasis in the visualized thoracic lumbar spine, sacrum and bilateral iliac bones.  No definitive dural based tumor is identified.  No compression fracture.  Spine spondylosis  06/20/2019, patient received loading dose of Firmagon 240 mg x 1. #06/22/2020, bone scan showed widespread osseous metastatic disease involving the appendicular and axial skeleton #07/10/2020, Dr. Lucky Cowboy placed Mediport  #INTERVAL HISTORY Cody Vaughan is a 60 y.o. male who has above history reviewed by me today presents for follow up visit for management of metastatic prostate cancer Problems and complaints are listed below Firmagon loading dose 06/19/2020 Patient reports night sweats/hot flash. Generalized pain has improved He has gained some weight since last visit. Patient had a COVID infection 2 weeks ago, main symptom is cough.  Symptoms now has improved.  No fever chills shortness of breath today.  He is accompanied by his wife today. Continues to have some ongoing bilateral hip pain, 4 out of 10.  He takes pain medicine which helps his symptoms.  Review of Systems  Constitutional: Positive for fatigue. Negative for appetite change, chills, fever and unexpected weight change.       Patient walks independently  HENT:   Negative for hearing loss and voice change.   Eyes: Negative for eye problems and icterus.  Respiratory: Negative for chest tightness, cough and shortness of breath.   Cardiovascular: Negative for chest pain and leg swelling.  Gastrointestinal: Negative for abdominal distention and abdominal pain.  Endocrine: Positive for hot flashes.  Genitourinary: Negative for difficulty urinating, dysuria and frequency.   Musculoskeletal: Positive for back pain. Negative for arthralgias.       Hip pain  Skin: Negative for itching and rash.  Neurological: Negative for light-headedness and numbness.  Hematological: Negative for adenopathy. Does not  bruise/bleed easily.  Psychiatric/Behavioral: Negative for confusion.    MEDICAL HISTORY:  Past Medical History:  Diagnosis Date  . Prostate cancer ([place])    2022    SURGICAL HISTORY: Past Surgical History:  Procedure Laterality Date  . PORTA CATH INSERTION N/A 07/10/2020   Procedure: PORTA CATH INSERTION;  Surgeon: Algernon Huxley, MD;  Location: [place] [facility];  Service: Cardiovascular;  Laterality: N/A;  . PROSTATE BIOPSY N/A 06/16/2020   Procedure: PROSTATE BIOPSY;  Surgeon: Billey Co, MD;  Location: [facility];  Service: Urology;  Laterality: N/A;  . SHOULDER SURGERY     1985  . TRANSRECTAL ULTRASOUND N/A 06/16/2020   Procedure: TRANSRECTAL ULTRASOUND;  Surgeon: Billey Co, MD;  Location: [facility];  Service: Urology;  Laterality: N/A;    SOCIAL HISTORY: Social History   Socioeconomic History  . Marital status: Married    Spouse name: Not on file  . Number of children: Not on file  . Years of education: Not on file  . Highest education level: Not on file  Occupational History  . 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 Smoking status: Current Every Day Smoker    Packs/day: 0.75  . Smokeless tobacco: Never Used  Substance and Sexual Activity  . Alcohol use: Not Currently  . Drug use: Never  . Sexual activity: Not Currently  Other Topics Concern  . Not on file  Social History Narrative  . Not on file   Social Determinants of Health   Financial Resource Strain: Not on file  Food Insecurity: Not on file  Transportation Needs: Not on file  Physical Activity: Not on file  Stress: Not on file  Social Connections: Not on file  Intimate Partner Violence: Not on file    FAMILY HISTORY: Family History  Problem Relation Age of Onset  . COPD Mother   . Heart attack Father     ALLERGIES:  has No Known Allergies.  MEDICATIONS:  Current Outpatient Medications  Medication Sig Dispense Refill  .  acetaminophen (TYLENOL) 325 MG tablet Take 650 mg by mouth every 6 (six) hours as needed for moderate pain or mild pain.    Marland Kitchen dexamethasone (DECADRON) 4 MG tablet Take 2 tablets (8 mg total) by mouth 2 (two) times daily. Start the day before Taxotere. Then daily after chemo for 2 days. 30 tablet 1  . lidocaine-prilocaine (EMLA) cream Apply to affected area once 30 g 3  . meloxicam (MOBIC) 7.5 MG tablet Take 1 tablet (7.5 mg total) by mouth daily. 30 tablet 0  . mirtazapine (REMERON) 15 MG tablet Take 1 tablet (15 mg total) by mouth at bedtime. 30 tablet 2  . Multiple Vitamins-Minerals (MULTIVITAMIN WITH MINERALS) tablet Take 1 tablet by mouth daily.    Marland Kitchen oxyCODONE (OXY IR/ROXICODONE) 5 MG immediate release tablet Take 1-2 tablets (5-10 mg total) by mouth every 4 (four) hours as needed for severe pain. 60 tablet 0  . oxyCODONE (OXYCONTIN) 10 mg 12 hr tablet Take 1 tablet (10 mg total) by mouth every 12 (twelve) hours. 60 tablet 0  . nicotine (NICODERM CQ - DOSED IN MG/24 HOURS) 21 mg/24hr patch Place 1 patch (21 mg total) onto the skin daily. (Patient not taking: No sig reported) 28 patch 0  . ondansetron (ZOFRAN) 8 MG tablet Take 1 tablet (8 mg total) by mouth 2 (  two) times daily as needed for refractory nausea / vomiting. (Patient not taking: No sig reported) 30 tablet 1  . prochlorperazine (COMPAZINE) 10 MG tablet Take 1 tablet (10 mg total) by mouth every 6 (six) hours as needed (Nausea or vomiting). (Patient not taking: No sig reported) 30 tablet 1   No current facility-administered medications for this visit.     PHYSICAL EXAMINATION: ECOG PERFORMANCE STATUS: 1 - Symptomatic but completely ambulatory Vitals:   07/17/20 0923  BP: 134/77  Pulse: 80  Resp: 16  Temp: (!) 97.3 F (36.3 C)   Filed Weights   07/17/20 0923  Weight: 168 lb (76.2 kg)    Physical Exam Constitutional:      General: He is not in acute distress. HENT:     Head: Normocephalic and atraumatic.  Eyes:      General: No scleral icterus. Cardiovascular:     Rate and Rhythm: Normal rate and regular rhythm.     Heart sounds: Normal heart sounds.  Pulmonary:     Effort: Pulmonary effort is normal. No respiratory distress.     Breath sounds: No wheezing.  Abdominal:     General: Bowel sounds are normal. There is no distension.     Palpations: Abdomen is soft.  Musculoskeletal:        General: No deformity. Normal range of motion.     Cervical back: Normal range of motion and neck supple.  Skin:    General: Skin is warm and dry.     Findings: No erythema or rash.  Neurological:     Mental Status: He is alert and oriented to person, place, and time. Mental status is at baseline.     Cranial Nerves: No cranial nerve deficit.     Coordination: Coordination normal.  Psychiatric:        Mood and Affect: Mood normal.     LABORATORY DATA:  I have reviewed the data as listed Lab Results  Component Value Date   WBC 9.8 07/17/2020   HGB 9.2 (L) 07/17/2020   HCT 28.8 (L) 07/17/2020   MCV 92.0 07/17/2020   PLT 306 07/17/2020   Recent Labs    06/18/20 0904 06/19/20 0901 07/17/20 0852  NA 132* 136 134*  K 3.8 3.6 3.9  CL 99 104 103  CO2 23 24 22   GLUCOSE 114* 172* 174*  BUN 13 18 21   CREATININE 0.77 0.71 0.78  CALCIUM 7.9* 8.2* 8.9  GFRNONAA >60 >60 >60  PROT 6.1* 6.4* 7.2  ALBUMIN 3.0* 2.9* 3.6  AST 152* 59* 30  ALT 19 17 36  ALKPHOS 2,435* 1,779* 381*  BILITOT 0.9 0.6 0.5   Iron/TIBC/Ferritin/ %Sat    Component Value Date/Time   IRON 121 07/17/2020 1000   TIBC 384 07/17/2020 1000   FERRITIN 299 07/17/2020 1000   IRONPCTSAT 32 07/17/2020 1000      RADIOGRAPHIC STUDIES: I have personally reviewed the radiological images as listed and agreed with the findings in the report. DG Pelvis 1-2 Views  Result Date: 06/18/2020 CLINICAL DATA:  Fall on Friday. History of substance abuse. History of metastatic prostate cancer. EXAM: PELVIS - 1-2 VIEW COMPARISON:  None. FINDINGS: No  evidence of acute osseous fracture or dislocation. Heterogeneous mineralization throughout the osseous pelvis, compatible with underlying osseous metastases, better demonstrated on earlier CT abdomen of 05/27/2020. Soft tissues about the pelvis are unremarkable. IMPRESSION: No acute findings. No evidence of acute osseous fracture or dislocation. Osseous metastases, better demonstrated on earlier CT abdomen of  05/27/2020. Electronically Signed   By: Franki Cabot M.D.   On: 06/18/2020 10:04   MR THORACIC SPINE W WO CONTRAST  Result Date: 06/18/2020 CLINICAL DATA:  Metastatic disease evaluation. Metastatic prostate cancer with back pain. EXAM: MRI THORACIC WITHOUT AND WITH CONTRAST TECHNIQUE: Multiplanar and multiecho pulse sequences of the thoracic spine were obtained without and with intravenous contrast. CONTRAST:  7.2mL GADAVIST GADOBUTROL 1 MMOL/ML IV SOLN COMPARISON:  CT chest 06/14/2020 FINDINGS: Intermittent motion degradation. Alignment:  No significant spondylolisthesis. Vertebrae: Multilevel Schmorl nodes. Vertebral body height is otherwise maintained. There is diffuse heterogeneous signal abnormality and patchy corresponding enhancement throughout the visualized thoracic spine and posterior ribs compatible with diffuse osseous metastatic disease. No evidence of pathologic compression fracture. Cord: No spinal cord signal abnormality is identified. No definite dural-based metastatic disease is identified. Paraspinal and other soft tissues: No definite soft tissue extension of tumor is identified. Specifically, no paraspinal masses identified. Disc levels: Mild-to-moderate disc degeneration throughout the thoracic spine. Multilevel shallow disc bulges and facet arthrosis. No significant focal disc herniation, spinal canal stenosis or neural foraminal narrowing. IMPRESSION: Diffuse osseous metastatic disease throughout the visualized thoracic spine and posterior ribs. No pathologic compression fracture.  No definite dural-based tumor is identified within the thoracic spinal canal. No soft tissue extension of tumor is identified. Thoracic spondylosis as described without significant spinal canal stenosis or neural foraminal narrowing. Electronically Signed   By: Kellie Simmering DO   On: 06/18/2020 16:06   MR Lumbar Spine W Wo Contrast  Result Date: 06/18/2020 CLINICAL DATA:  Metastatic disease evaluation. Metastatic prostate cancer. Back pain. EXAM: MRI LUMBAR SPINE WITHOUT AND WITH CONTRAST TECHNIQUE: Multiplanar and multiecho pulse sequences of the lumbar spine were obtained without and with intravenous contrast. CONTRAST:  7.36mL GADAVIST GADOBUTROL 1 MMOL/ML IV SOLN COMPARISON:  CT abdomen/pelvis 05/27/2020. FINDINGS: Segmentation: 5 lumbar vertebrae. The caudal most well-formed intervertebral disc space is designated L5-S1. Alignment:  No significant spondylolisthesis. Vertebrae: Multilevel Schmorl nodes. Mild chronic L4 superior endplate compression deformity. Vertebral body height is otherwise maintained. No acute pathologic compression fracture. There is diffuse heterogeneous osseous signal abnormality and patchy enhancement throughout the visualized lumbar spine, sacrum and bilateral iliac bones compatible with diffuse osseous metastatic disease. Conus medullaris and cauda equina: Conus extends to the L2 level. No signal abnormality within the visualized distal spinal cord. No definite dural-based tumor is identified. Paraspinal and other soft tissues: No extraosseous tumor is identified. Specifically, no paraspinal soft tissue mass is appreciated. Disc levels: Multilevel disc degeneration. Most notably, there is moderate to moderately advanced disc degeneration at T12-L1, L1-L2 and L2-L3. T12-L1: Small disc bulge. Mild facet arthrosis. Mild relative spinal canal narrowing. No significant foraminal stenosis. L1-L2: Disc bulge with endplate spurring. Superimposed small left subarticular/foraminal disc  protrusion. Mild facet arthrosis. Mild bilateral subarticular and central canal narrowing without appreciable nerve root impingement. Mild left neural foraminal narrowing. L2-L3: Disc bulge with endplate spurring. Moderate facet arthrosis with ligamentum flavum hypertrophy. Moderate subtle subarticular stenosis with potential to affect either descending L3 nerve root. Moderate central canal stenosis. Moderate bilateral neural foraminal narrowing. L3-L4: Disc bulge with endplate spurring. Superimposed left foraminal/extraforaminal disc protrusion at site of posterior annular fissure. Moderate facet arthrosis with slight ligamentum flavum hypertrophy. Mild bilateral subarticular narrowing without appreciable nerve root impingement. Central canal patent. Moderate left neural foraminal narrowing L4-L5: Disc bulge with superimposed broad-based shallow central disc protrusion. Facet arthrosis (mild right, moderate/advanced left). Ligamentum flavum hypertrophy. Bilateral subarticular narrowing (moderate right, mild left) with potential  to affect the descending right L5 nerve root. Mild to moderate central canal narrowing. Moderate bilateral neural foraminal narrowing. L5-S1: Disc bulge with slight endplate spurring. Advanced facet arthrosis with ligamentum flavum hypertrophy. Prominence of the dorsal epidural fat. Mild bilateral subarticular narrowing without appreciable nerve root impingement. No significant foraminal stenosis. IMPRESSION: Findings compatible with diffuse osseous metastatic disease within the visualized lumbar spine, sacrum and bilateral iliac bones. No definite dural-based tumor is identified within the lumbar spinal canal. No extraosseous tumor extension is identified. Lumbar spondylosis as outlined. Canal stenosis is greatest at L2-L3 (moderate) and L4-L5 (mild/moderate). Potential nerve root impingement within the spinal canal at L2-L3 and L4-L5. Sites of mild and moderate neural foraminal narrowing  as detailed. Electronically Signed   By: Kellie Simmering DO   On: 06/18/2020 16:21   NM Bone Scan Whole Body  Result Date: 06/22/2020 CLINICAL DATA:  Prostate cancer, elevated PSA 678 EXAM: NUCLEAR MEDICINE WHOLE BODY BONE SCAN TECHNIQUE: Whole body anterior and posterior images were obtained approximately 3 hours after intravenous injection of radiopharmaceutical. RADIOPHARMACEUTICALS:  20.179 mCi Technetium-11m MDP IV COMPARISON:  None Correlation: CT chest 06/14/2020, CT abdomen and pelvis 05/27/2020 FINDINGS: Innumerable sites of focal abnormal osseous tracer accumulation are seen throughout the appendicular and axial skeleton consistent with widespread osseous metastatic disease. These include calvaria, cervical/thoracic/lumbar spine, BILATERAL ribs, sternum, and pelvis. In addition, foci of osseous metastasis are seen at BILATERAL humeri, BILATERAL scapula, and BILATERAL femora. Uptake at LEFT medial tibial plateau may be degenerative in origin though metastasis is not completely excluded. Small amount of tracer within the urinary bladder but no significant renal localization of tracer is seen. IMPRESSION: Widespread osseous metastatic disease involving the appendicular and axial skeleton as above. Electronically Signed   By: Lavonia Dana M.D.   On: 06/22/2020 14:55   MR SACRUM SI JOINTS W WO CONTRAST  Result Date: 06/18/2020 CLINICAL DATA:  Metastatic prostate cancer EXAM: MRI PELVIS (SACRUM) WITHOUT AND WITH CONTRAST TECHNIQUE: Multiplanar multisequence MR imaging of the pelvis was performed both before and after administration of intravenous contrast. Sacral protocol utilized. CONTRAST:  7.51mL GADAVIST GADOBUTROL 1 MMOL/ML IV SOLN COMPARISON:  CT pelvis 05/27/2020 FINDINGS: Osseous structures: Findings of diffuse metastatic disease throughout the visualized sacrum and in the adjacent iliac bones and ischia. There is some relative sparing of the coccyx. This manifests is diffusely low T2 signal in the  involved regions, as well as patchy enhancement. This matches the diffuse sclerotic appearance with sparing of the coccyx seen on CT of 05/27/2020. In the sacrum, I do not observe compelling findings of superimposed fracture or substantial extraosseous extension. There is some anterior spurring along the SI joints. Degenerative facet arthropathy at L5-S1 noted. Sacroiliac joints: No erosion or effusion. Presacral soft tissues: There is moderate presacral edema without a discrete mass. Sacral plexus: No discrete impinging lesion is identified along the sacral plexus or sciatic notch. Surrounding tissues: Mild edema signal is present in the iliacus and gluteus medius and minimus musculature in a symmetric manner. There is also some low-level edema in the medial piriformis musculature bilaterally. Visualized portion of the rectum unremarkable. IMPRESSION: 1. Diffuse malignant involvement of the sacrum as well as the adjacent portions of the iliac bones and ischia. Relative sparing of the coccyx. No appreciable fracture or impingement along the sacral plexus or sciatic notch region, although there is some regional edema tracking along adjacent musculature and in the presacral space as detailed above. Electronically Signed   By: Cindra Eves.D.  On: 06/18/2020 15:50   PERIPHERAL VASCULAR CATHETERIZATION  Result Date: 07/10/2020 See op note  US Venous Img Lower Bilateral (DVT)  Result Date: 06/18/2020 CLINICAL DATA:  Initial evaluation for acute pain for several weeks. EXAM: BILATERAL LOWER EXTREMITY VENOUS DOPPLER ULTRASOUND TECHNIQUE: Gray-scale sonography with graded compression, as well as color Doppler and duplex ultrasound were performed to evaluate the lower extremity deep venous systems from the level of the common femoral vein and including the common femoral, femoral, profunda femoral, popliteal and calf veins including the posterior tibial, peroneal and gastrocnemius veins when visible. The  superficial great saphenous vein was also interrogated. Spectral Doppler was utilized to evaluate flow at rest and with distal augmentation maneuvers in the common femoral, femoral and popliteal veins. COMPARISON:  None. FINDINGS: RIGHT LOWER EXTREMITY Common Femoral Vein: No evidence of thrombus. Normal compressibility, respiratory phasicity and response to augmentation. Saphenofemoral Junction: No evidence of thrombus. Normal compressibility and flow on color Doppler imaging. Profunda Femoral Vein: No evidence of thrombus. Normal compressibility and flow on color Doppler imaging. Femoral Vein: No evidence of thrombus. Normal compressibility, respiratory phasicity and response to augmentation. Popliteal Vein: No evidence of thrombus. Normal compressibility, respiratory phasicity and response to augmentation. Calf Veins: No evidence of thrombus. Normal compressibility and flow on color Doppler imaging. Superficial Great Saphenous Vein: No evidence of thrombus. Normal compressibility. Venous Reflux:  None. Other Findings:  None. LEFT LOWER EXTREMITY Common Femoral Vein: No evidence of thrombus. Normal compressibility, respiratory phasicity and response to augmentation. Saphenofemoral Junction: No evidence of thrombus. Normal compressibility and flow on color Doppler imaging. Profunda Femoral Vein: No evidence of thrombus. Normal compressibility and flow on color Doppler imaging. Femoral Vein: No evidence of thrombus. Normal compressibility, respiratory phasicity and response to augmentation. Popliteal Vein: No evidence of thrombus. Normal compressibility, respiratory phasicity and response to augmentation. Calf Veins: No evidence of thrombus. Normal compressibility and flow on color Doppler imaging. Superficial Great Saphenous Vein: No evidence of thrombus. Normal compressibility. Venous Reflux:  None. Other Findings:  None. IMPRESSION: No evidence of deep venous thrombosis in either lower extremity. Electronically  Signed   By: Jeannine Boga M.D.   On: 06/18/2020 19:01      ASSESSMENT & PLAN:  1. Prostate cancer ([place])   2. Neoplasm related pain   3. Androgen deprivation therapy   4. Encounter for antineoplastic chemotherapy   5. Bone metastasis ([place])   6. Normocytic anemia   Cancer Staging Prostate cancer [facility]) Staging form: Prostate, AJCC 8th Edition - Clinical stage from 06/23/2020: Stage IVB (cT2c, cNX, cM1b, PSA: 678, Grade Group: 5) - Signed by Earlie Server, MD on 06/23/2020   #Metastatic prostate cancer Continue androgen deprivation therapy.--PSA has decreased to 2.16. -Hot flash/night sweats likely due to androgen deprivation therapy.  Monitor. Proceed with Firmagon 80 mg x 1 today.  Consider to switch to Lupron in 4 weeks Discussed with patient about the rationale and potential side effects of docetaxel and he agrees with the treatment. Patient will receive G-CSF  #Anemia, hemoglobin has dropped to 9.2.  Add reticulocyte panel. Check iron panel, folate and vitamin B12.  #Extensive bone metastasis, neoplasm related pain. Continue OxyContin twice daily.  Oxycodone 5 mg tablet every 4 hours as needed. Consider palliative radiation. Currently concerned about further decrease of hemoglobin.  I will refer Near future if his pain persists.  Supportive care measures are necessary for patient well-being and will be provided as necessary. We spent sufficient time to discuss many aspect of care, questions  were answered to patient's satisfaction.  # Weight loss, cancer associated. Refer to nutrition.  Has gained weight today.  Monitor.  Orders Placed This Encounter  Procedures  . Ferritin    Standing Status:   Future    Number of Occurrences:   1    Standing Expiration Date:   07/17/2021  . Iron and TIBC    Standing Status:   Future    Number of Occurrences:   1    Standing Expiration Date:   07/17/2021  . Retic Panel    Standing Status:   Future    Number of Occurrences:   1     Standing Expiration Date:   07/17/2021  . Vitamin B12    Standing Status:   Future    Number of Occurrences:   1    Standing Expiration Date:   07/17/2021  . Folate    Standing Status:   Future    Number of Occurrences:   1    Standing Expiration Date:   07/17/2021    All questions were answered. The patient knows to call the clinic with any problems questions or concerns.  cc Vladimir Crofts, MD    Return of visit: 1 week  Earlie Server, MD, PhD Hematology Oncology [facility] Pager- [phone] 07/17/2020

## 2020-07-18 ENCOUNTER — Telehealth: Payer: Self-pay

## 2020-07-18 NOTE — Telephone Encounter (Signed)
Telephone call to patient for follow up after receiving first infusion.   Patient wife states infusion went great.  States eating good and drinking plenty of fluids.   Denies any nausea or vomiting.  Encouraged patient to call for any concerns or questions.  

## 2020-07-18 NOTE — Telephone Encounter (Signed)
-----   Message from Earlie Server, MD sent at 07/17/2020  8:25 PM EDT ----- Please add injection encounter 4 weeks from today for Eligard.

## 2020-07-18 NOTE — Telephone Encounter (Signed)
Please schedule as MD recommends *new* Eligard.

## 2020-07-19 ENCOUNTER — Inpatient Hospital Stay: Payer: Self-pay

## 2020-07-19 ENCOUNTER — Other Ambulatory Visit: Payer: Self-pay

## 2020-07-19 DIAGNOSIS — C61 Malignant neoplasm of prostate: Secondary | ICD-10-CM

## 2020-07-19 MED ORDER — PEGFILGRASTIM-JMDB 6 MG/0.6ML ~~LOC~~ SOSY
6.0000 mg | PREFILLED_SYRINGE | Freq: Once | SUBCUTANEOUS | Status: AC
Start: 2020-07-19 — End: 2020-07-19
  Administered 2020-07-19: 6 mg via SUBCUTANEOUS
  Filled 2020-07-19: qty 0.6

## 2020-07-24 ENCOUNTER — Inpatient Hospital Stay: Payer: Self-pay

## 2020-07-24 ENCOUNTER — Inpatient Hospital Stay (HOSPITAL_BASED_OUTPATIENT_CLINIC_OR_DEPARTMENT_OTHER): Payer: Self-pay | Admitting: Oncology

## 2020-07-24 ENCOUNTER — Other Ambulatory Visit: Payer: Self-pay

## 2020-07-24 ENCOUNTER — Encounter: Payer: Self-pay | Admitting: Oncology

## 2020-07-24 VITALS — BP 117/77 | HR 102 | Temp 98.1°F | Resp 16 | Wt 170.3 lb

## 2020-07-24 DIAGNOSIS — G893 Neoplasm related pain (acute) (chronic): Secondary | ICD-10-CM

## 2020-07-24 DIAGNOSIS — C61 Malignant neoplasm of prostate: Secondary | ICD-10-CM

## 2020-07-24 DIAGNOSIS — Z5111 Encounter for antineoplastic chemotherapy: Secondary | ICD-10-CM | POA: Insufficient documentation

## 2020-07-24 DIAGNOSIS — Z79818 Long term (current) use of other agents affecting estrogen receptors and estrogen levels: Secondary | ICD-10-CM

## 2020-07-24 LAB — COMPREHENSIVE METABOLIC PANEL
ALT: 40 U/L (ref 0–44)
AST: 31 U/L (ref 15–41)
Albumin: 3.4 g/dL — ABNORMAL LOW (ref 3.5–5.0)
Alkaline Phosphatase: 293 U/L — ABNORMAL HIGH (ref 38–126)
Anion gap: 11 (ref 5–15)
BUN: 15 mg/dL (ref 8–23)
CO2: 22 mmol/L (ref 22–32)
Calcium: 8.4 mg/dL — ABNORMAL LOW (ref 8.9–10.3)
Chloride: 104 mmol/L (ref 98–111)
Creatinine, Ser: 0.79 mg/dL (ref 0.61–1.24)
GFR, Estimated: >60 mL/min (ref >60)
Glucose, Bld: 130 mg/dL — ABNORMAL HIGH (ref 70–99)
Potassium: 4 mmol/L (ref 3.5–5.1)
Sodium: 137 mmol/L (ref 135–145)
Total Bilirubin: 0.4 mg/dL (ref 0.3–1.2)
Total Protein: 6.1 g/dL — ABNORMAL LOW (ref 6.5–8.1)

## 2020-07-24 LAB — CBC WITH DIFFERENTIAL/PLATELET
Abs Immature Granulocytes: 2.77 10*3/uL — ABNORMAL HIGH (ref 0.00–0.07)
Basophils Absolute: 0 10*3/uL (ref 0.0–0.1)
Basophils Relative: 0 %
Eosinophils Absolute: 0.1 10*3/uL (ref 0.0–0.5)
Eosinophils Relative: 1 %
HCT: 28.8 % — ABNORMAL LOW (ref 39.0–52.0)
Hemoglobin: 9.5 g/dL — ABNORMAL LOW (ref 13.0–17.0)
Immature Granulocytes: 13 %
Lymphocytes Relative: 15 %
Lymphs Abs: 3.2 10*3/uL (ref 0.7–4.0)
MCH: 29.9 pg (ref 26.0–34.0)
MCHC: 33 g/dL (ref 30.0–36.0)
MCV: 90.6 fL (ref 80.0–100.0)
Monocytes Absolute: 3.6 10*3/uL — ABNORMAL HIGH (ref 0.1–1.0)
Monocytes Relative: 17 %
Neutro Abs: 11.2 10*3/uL — ABNORMAL HIGH (ref 1.7–7.7)
Neutrophils Relative %: 54 %
Platelets: 213 10*3/uL (ref 150–400)
RBC: 3.18 MIL/uL — ABNORMAL LOW (ref 4.22–5.81)
RDW: 19.2 % — ABNORMAL HIGH (ref 11.5–15.5)
Smear Review: NORMAL
WBC: 20.9 10*3/uL — ABNORMAL HIGH (ref 4.0–10.5)
nRBC: 1.3 % — ABNORMAL HIGH (ref 0.0–0.2)

## 2020-07-24 MED ORDER — MELOXICAM 7.5 MG PO TABS: 7.5000 mg | ORAL_TABLET | Freq: Every day | ORAL | 1 refills | Status: DC | PRN

## 2020-07-24 MED ORDER — HEPARIN SOD (PORK) LOCK FLUSH 100 UNIT/ML IV SOLN
500.0000 [IU] | Freq: Once | INTRAVENOUS | Status: AC
  Administered 2020-07-24: 500 [IU] via INTRAVENOUS
  Filled 2020-07-24: qty 5

## 2020-07-24 MED ORDER — OMEPRAZOLE 20 MG PO CPDR: 20.0000 mg | DELAYED_RELEASE_CAPSULE | Freq: Every day | ORAL | 1 refills | Status: DC

## 2020-07-24 MED ORDER — CALCIUM 600-400 MG-UNIT PO CHEW: 2.0000 | CHEWABLE_TABLET | Freq: Every day | ORAL | 0 refills | Status: DC

## 2020-07-24 NOTE — Progress Notes (Signed)
Hematology/Oncology Consult note [facility] [phone] [phone]   Patient Care Team: Patient, No Pcp Per (Inactive) as PCP - General (General Practice)  REFERRING PROVIDER: Vladimir Crofts, MD  CHIEF COMPLAINTS/REASON FOR VISIT:  Follow-up for prostate cancer treatment  HISTORY OF PRESENTING ILLNESS:   Cody Vaughan is a  60 y.o.  male with PMH listed below was seen in consultation at the request of  Vladimir Crofts, MD  for evaluation of bone lesions 05/27/2020 patient presented to emergency room for evaluation of generalized weakness and and body pain.  Prior to the presentations, patient also had history of tooth abscess and was prescribed antibiotics. Reports acute on chronic right-sided lumbar pain without any prior trauma history.  Profound weakness.  Decreased appetite and loss of weight about 10 pounds during the past few months.  Also complained intermittent right upper quadrant discomfort  326 04/10/2020 CT abdomen pelvis showed a diffusely patchy sclerotic appearance of the osseous structures suspicious for osseous metastatic disease.  Prostate gland is heterogeneous in appearance.  Correlate with PSA.  Patient had a mild superior endplate compression deformity of L4 with less than 10% vertebral body height loss.  H indeterminate.  Fusiform infrarenal abdominal aortic aneurysm measuring up to 3.5 cm.  Follow-up in 2 years. Suspicious findings for early acute uncomplicated appendicitis.  Patient denies any right lower quadrant pain.  In the emergency room, he has a negative Murphy sign.  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  Highest Gleason score 5+4 Postprocedure, patient has had hematuria and hematochezia, bilateral lower extremity pain, AKI orthostatic hypovolemia, sepsis and was admitted and treated.  Patient was discharged on a course of antibiotics.  06/18/2020  MRI thoracic, lumbar, sacrum is compatible with diffuse osseous metastasis in the visualized thoracic lumbar spine, sacrum and bilateral iliac bones.  No definitive dural based tumor is identified.  No compression fracture.  Spine spondylosis  06/20/2019, patient received loading dose of Firmagon 240 mg x 1. #06/22/2020, bone scan showed widespread osseous metastatic disease involving the appendicular and axial skeleton #April 2022. COVID infection. #07/10/2020, Dr. Lucky Cowboy placed Mediport #07/17/2020, cycle 1 docetaxel.  Firmagon x1  #INTERVAL HISTORY Cody Vaughan is a 60 y.o. male who has above history reviewed by me today presents for follow up visit for management of metastatic prostate cancer Problems and complaints are listed below Status post cycle 1 docetaxel.  Overall tolerates well.  No nausea vomiting diarrhea.  Bone pain after G-CSF for 2 days. Continues to have some ongoing bilateral hip pain, 4 out of 10.  He takes meloxicam, OxyContin twice daily and as needed oxycodone.  Pain is well controlled.  He continues to work  Review of Systems  Constitutional: Positive for fatigue. Negative for appetite change, chills, fever and unexpected weight change.       Patient walks independently  HENT:   Negative for hearing loss and voice change.   Eyes: Negative for eye problems and icterus.  Respiratory: Negative for chest tightness, cough and shortness of breath.   Cardiovascular: Negative for chest pain and leg swelling.  Gastrointestinal: Negative for abdominal distention and abdominal pain.  Endocrine: Positive for hot flashes.  Genitourinary: Negative for difficulty urinating, dysuria and frequency.   Musculoskeletal: Negative for arthralgias and back pain.       Hip pain  Skin: Negative for itching and rash.  Neurological: Negative for light-headedness and numbness.  Hematological: Negative  for adenopathy. Does not bruise/bleed easily.  Psychiatric/Behavioral: Negative for confusion.     MEDICAL HISTORY:  Past Medical History:  Diagnosis Date  . Prostate cancer ([place])    2022    SURGICAL HISTORY: Past Surgical History:  Procedure Laterality Date  . PORTA CATH INSERTION N/A 07/10/2020   Procedure: PORTA CATH INSERTION;  Surgeon: Algernon Huxley, MD;  Location: [facility];  Service: Cardiovascular;  Laterality: N/A;  . PROSTATE BIOPSY N/A 06/16/2020   Procedure: PROSTATE BIOPSY;  Surgeon: Billey Co, MD;  Location: [facility];  Service: Urology;  Laterality: N/A;  . SHOULDER SURGERY     1985  . TRANSRECTAL ULTRASOUND N/A 06/16/2020   Procedure: TRANSRECTAL ULTRASOUND;  Surgeon: Billey Co, MD;  Location: [facility];  Service: Urology;  Laterality: N/A;    SOCIAL HISTORY: Social History   Socioeconomic History  . Marital status: Married    Spouse name: Not on file  . Number of children: Not on file  . Years of education: Not on file  . Highest education level: Not on file  Occupational History  . 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 Smoking status: Current Every Day Smoker    Packs/day: 0.75  . Smokeless tobacco: Never Used  Substance and Sexual Activity  . Alcohol use: Not Currently  . Drug use: Never  . Sexual activity: Not Currently  Other Topics Concern  . Not on file  Social History Narrative  . Not on file   Social Determinants of Health   Financial Resource Strain: Not on file  Food Insecurity: Not on file  Transportation Needs: Not on file  Physical Activity: Not on file  Stress: Not on file  Social Connections: Not on file  Intimate Partner Violence: Not on file    FAMILY HISTORY: Family History  Problem Relation Age of Onset  . COPD Mother   . Heart attack Father     ALLERGIES:  has No Known Allergies.  MEDICATIONS:  Current Outpatient Medications  Medication Sig Dispense Refill  . acetaminophen (TYLENOL) 325 MG tablet Take 650 mg by mouth every 6 (six) hours as  needed for moderate pain or mild pain.    Marland Kitchen dexamethasone (DECADRON) 4 MG tablet Take 2 tablets (8 mg total) by mouth 2 (two) times daily. Start the day before Taxotere. Then daily after chemo for 2 days. 30 tablet 1  . lidocaine-prilocaine (EMLA) cream Apply to affected area once 30 g 3  . meloxicam (MOBIC) 7.5 MG tablet Take 1 tablet (7.5 mg total) by mouth daily. 30 tablet 0  . mirtazapine (REMERON) 15 MG tablet Take 1 tablet (15 mg total) by mouth at bedtime. 30 tablet 2  . Multiple Vitamins-Minerals (MULTIVITAMIN WITH MINERALS) tablet Take 1 tablet by mouth daily.    . nicotine (NICODERM CQ - DOSED IN MG/24 HOURS) 21 mg/24hr patch Place 1 patch (21 mg total) onto the skin daily. (Patient not taking: No sig reported) 28 patch 0  . ondansetron (ZOFRAN) 8 MG tablet Take 1 tablet (8 mg total) by mouth 2 (two) times daily as needed for refractory nausea / vomiting. (Patient not taking: No sig reported) 30 tablet 1  . oxyCODONE (OXY IR/ROXICODONE) 5 MG immediate release tablet Take 1-2 tablets (5-10 mg total) by mouth every 4 (four) hours as needed for severe pain. 60 tablet 0  . oxyCODONE (OXYCONTIN) 10 mg 12 hr tablet Take 1 tablet (10 mg  total) by mouth every 12 (twelve) hours. 60 tablet 0  . prochlorperazine (COMPAZINE) 10 MG tablet Take 1 tablet (10 mg total) by mouth every 6 (six) hours as needed (Nausea or vomiting). (Patient not taking: No sig reported) 30 tablet 1   No current facility-administered medications for this visit.     PHYSICAL EXAMINATION: ECOG PERFORMANCE STATUS: 1 - Symptomatic but completely ambulatory Vitals:   07/24/20 0851  BP: 117/77  Pulse: (!) 102  Resp: 16  Temp: 98.1 F (36.7 C)   Filed Weights   07/24/20 0851  Weight: 170 lb 4.8 oz (77.2 kg)    Physical Exam Constitutional:      General: He is not in acute distress. HENT:     Head: Normocephalic and atraumatic.  Eyes:     General: No scleral icterus. Cardiovascular:     Rate and Rhythm: Normal  rate and regular rhythm.     Heart sounds: Normal heart sounds.  Pulmonary:     Effort: Pulmonary effort is normal. No respiratory distress.     Breath sounds: No wheezing.  Abdominal:     General: Bowel sounds are normal. There is no distension.     Palpations: Abdomen is soft.  Musculoskeletal:        General: No deformity. Normal range of motion.     Cervical back: Normal range of motion and neck supple.  Skin:    General: Skin is warm and dry.     Findings: No erythema or rash.  Neurological:     Mental Status: He is alert and oriented to person, place, and time. Mental status is at baseline.     Cranial Nerves: No cranial nerve deficit.     Coordination: Coordination normal.  Psychiatric:        Mood and Affect: Mood normal.     LABORATORY DATA:  I have reviewed the data as listed Lab Results  Component Value Date   WBC 9.8 07/17/2020   HGB 9.2 (L) 07/17/2020   HCT 28.8 (L) 07/17/2020   MCV 92.0 07/17/2020   PLT 306 07/17/2020   Recent Labs    06/18/20 0904 06/19/20 0901 07/17/20 0852  NA 132* 136 134*  K 3.8 3.6 3.9  CL 99 104 103  CO2 23 24 22   GLUCOSE 114* 172* 174*  BUN 13 18 21   CREATININE 0.77 0.71 0.78  CALCIUM 7.9* 8.2* 8.9  GFRNONAA >60 >60 >60  PROT 6.1* 6.4* 7.2  ALBUMIN 3.0* 2.9* 3.6  AST 152* 59* 30  ALT 19 17 36  ALKPHOS 2,435* 1,779* 381*  BILITOT 0.9 0.6 0.5   Iron/TIBC/Ferritin/ %Sat    Component Value Date/Time   IRON 121 07/17/2020 1000   TIBC 384 07/17/2020 1000   FERRITIN 299 07/17/2020 1000   IRONPCTSAT 32 07/17/2020 1000      RADIOGRAPHIC STUDIES: I have personally reviewed the radiological images as listed and agreed with the findings in the report. PERIPHERAL VASCULAR CATHETERIZATION  Result Date: 07/10/2020 See op note     ASSESSMENT & PLAN:  1. Prostate cancer ([place])   2. Neoplasm related pain   3. Androgen deprivation therapy   4. Encounter for antineoplastic chemotherapy   Cancer Staging Prostate cancer  [facility]) Staging form: Prostate, AJCC 8th Edition - Clinical stage from 06/23/2020: Stage IVB (cT2c, cNX, cM1b, PSA: 678, Grade Group: 5) - Signed by Earlie Server, MD on 06/23/2020   #Metastatic prostate cancer On  androgen deprivation therapy.--PSA has decreased to 2.16.  We will  switch to long-acting Eligard at the next visit. Tolerated first cycle of docetaxel. Labs reviewed and discussed with patient. Leukocytosis secondary to G-CSF.  #Anemia, hemoglobin has dropped to 9.5.   Adequate iron level, folate and vitamin B12.  #Extensive bone metastasis, neoplasm related pain. Continue OxyContin twice daily.  Oxycodone 5 mg tablet every 4 hours as needed. Refer to radiation oncology for consideration of palliative radiation to his hip.  Supportive care measures are necessary for patient well-being and will be provided as necessary. We spent sufficient time to discuss many aspect of care, questions were answered to patient's satisfaction.  Orders Placed This Encounter  Procedures  . Ambulatory referral to Radiation Oncology    Referral Priority:   Routine    Referral Type:   Consultation    Referral Reason:   Specialty Services Required    Requested Specialty:   Radiation Oncology    Number of Visits Requested:   1    All questions were answered. The patient knows to call the clinic with any problems questions or concerns.  Return of visit: 2 weeks  Earlie Server, MD, PhD Hematology Oncology [facility] Pager- [phone] 07/24/2020

## 2020-07-24 NOTE — Progress Notes (Signed)
Patient has hip pain 4/10 today.  He gets hiccups a lot.

## 2020-07-24 NOTE — Progress Notes (Signed)
Nutrition Follow-up:  Patient with metastatic prostate cancer.  Patient to receive docetaxel.  Receiving firmagon.  Met with patient and wife today.  Patient reports that appetite is better and eating all the time.  Last night at 2 hot dogs, potato salad, baked beans and dessert for supper.  This am ate cereal, peanut butter sandwich and drank a boost shake. Drinks about 3 boost shakes per day.  Has been snacking on nuts, granola and protein bars.      Medications: reviewed  Labs: glucose 130  Anthropometrics:   Weight 170 lb 4.8 oz today increased  164 lb 12.8 oz on 4/22 173 lb on 3/26  UBW of 180-185 lb   NUTRITION DIAGNOSIS: Inadequate oral intake improving   INTERVENTION:  Continue with high calorie, high protein foods Continue oral nutrition supplements    MONITORING, EVALUATION, GOAL: weight trends, intake   NEXT VISIT: Monday, June 6 during infusion  Cody Vaughan B. Zenia Resides, [place], [place] Registered Dietitian [phone] (mobile)

## 2020-07-26 ENCOUNTER — Ambulatory Visit
Admission: RE | Admit: 2020-07-26 | Discharge: 2020-07-26 | Disposition: A | Discharge status: Home / Self Care | Payer: Self-pay | Source: Ambulatory Visit | Attending: Radiation Oncology | Admitting: Radiation Oncology

## 2020-07-26 ENCOUNTER — Other Ambulatory Visit: Payer: Self-pay

## 2020-07-26 ENCOUNTER — Encounter: Payer: Self-pay | Admitting: Radiation Oncology

## 2020-07-26 VITALS — BP 108/75 | HR 105 | Temp 96.4°F | Resp 16 | Wt 171.1 lb

## 2020-07-26 DIAGNOSIS — F1120 Opioid dependence, uncomplicated: Secondary | ICD-10-CM | POA: Insufficient documentation

## 2020-07-26 DIAGNOSIS — C7951 Secondary malignant neoplasm of bone: Secondary | ICD-10-CM | POA: Insufficient documentation

## 2020-07-26 DIAGNOSIS — Z79899 Other long term (current) drug therapy: Secondary | ICD-10-CM | POA: Insufficient documentation

## 2020-07-26 DIAGNOSIS — F17210 Nicotine dependence, cigarettes, uncomplicated: Secondary | ICD-10-CM | POA: Insufficient documentation

## 2020-07-26 DIAGNOSIS — M545 Low back pain, unspecified: Secondary | ICD-10-CM | POA: Insufficient documentation

## 2020-07-26 DIAGNOSIS — C61 Malignant neoplasm of prostate: Secondary | ICD-10-CM | POA: Insufficient documentation

## 2020-07-26 NOTE — Consult Note (Signed)
NEW PATIENT EVALUATION  Name: Cody Vaughan  MRN: [id number]  Date:   07/26/2020     DOB: 1960/06/21   This 60 y.o. male patient presents to the clinic for initial evaluation of palliative treatment for bone metastasis and patient with stage IV prostate cancer.  REFERRING PHYSICIAN: Earlie Server, MD  CHIEF COMPLAINT:  Chief Complaint  Patient presents with  . Cancer    Initial consultation    DIAGNOSIS: The encounter diagnosis was Bone metastases ([place]).   PREVIOUS INVESTIGATIONS:  Bone scan and MRI scans of spine all reviewed Pathology report reviewed Clinical notes reviewed  HPI: Patient is a 60 year old male whose wife is a former patient of mine who is seen today for evaluation of stage IV prostate cancer with a complaint of significant narcotic dependent pain in his lower back and sacrum.  He initially presented back in March with lower back pain to the emergency room imaging showed diffuse patchy sclerotic appearance of his bone suspicious for metastatic disease.  L4 showed compression deformity with less than 10% of people body height loss.  He underwent transrectal ultrasound-guided biopsy back i 1 year ago showing 8 out of 12 cores positive for Gleason 9 (5+4).  MRI scans of thoracic lumbar and sacrum showed diffuse osseous metastatic disease.  He has been started on ADT therapy with thermogram which is planned to be switched over to Eligard.  Patient is also had this month 1 cycle of Doxy Taxol.  He is seen today for narcotic dependent pain in his lower back and sacrum.  MRI scans and bone scan shows involvement of that region.  He is ambulating well without assistance.  He is having no focal neurologic deficits.  PLANNED TREATMENT REGIMEN: Lower L-spine and SI joint palliative radiation  PAST MEDICAL HISTORY:  has a past medical history of Prostate cancer ([place]).    PAST SURGICAL HISTORY:  Past Surgical History:  Procedure Laterality Date  . PORTA CATH INSERTION N/A 07/10/2020    Procedure: PORTA CATH INSERTION;  Surgeon: Algernon Huxley, MD;  Location: [facility];  Service: Cardiovascular;  Laterality: N/A;  . PROSTATE BIOPSY N/A 06/16/2020   Procedure: PROSTATE BIOPSY;  Surgeon: Billey Co, MD;  Location: [facility];  Service: Urology;  Laterality: N/A;  . SHOULDER SURGERY     1985  . TRANSRECTAL ULTRASOUND N/A 06/16/2020   Procedure: TRANSRECTAL ULTRASOUND;  Surgeon: Billey Co, MD;  Location: [facility];  Service: Urology;  Laterality: N/A;    FAMILY HISTORY: family history includes COPD in his mother; Heart attack in his father.  SOCIAL HISTORY:  reports that he has been smoking. He has been smoking about 0.75 packs per day. He has never used smokeless tobacco. He reports previous alcohol use. He reports that he does not use drugs.  ALLERGIES: Patient has no known allergies.  MEDICATIONS:  Current Outpatient Medications  Medication Sig Dispense Refill  . acetaminophen (TYLENOL) 325 MG tablet Take 650 mg by mouth every 6 (six) hours as needed for moderate pain or mild pain.    . Calcium 600-400 MG-UNIT CHEW Chew 2 tablets by mouth daily. 90 tablet 0  . dexamethasone (DECADRON) 4 MG tablet Take 2 tablets (8 mg total) by mouth 2 (two) times daily. Start the day before Taxotere. Then daily after chemo for 2 days. 30 tablet 1  . lidocaine-prilocaine (EMLA) cream Apply to affected area once 30 g 3  . meloxicam (MOBIC) 7.5 MG tablet Take 1 tablet (7.5 mg  total) by mouth daily as needed for pain. 30 tablet 1  . mirtazapine (REMERON) 15 MG tablet Take 1 tablet (15 mg total) by mouth at bedtime. 30 tablet 2  . Multiple Vitamins-Minerals (MULTIVITAMIN WITH MINERALS) tablet Take 1 tablet by mouth daily.    Marland Kitchen omeprazole (PRILOSEC) 20 MG capsule Take 1 capsule (20 mg total) by mouth daily. 30 capsule 1  . oxyCODONE (OXY IR/ROXICODONE) 5 MG immediate release tablet Take 1-2 tablets (5-10 mg total) by mouth every 4 (four) hours as needed for severe pain. 60 tablet  0  . oxyCODONE (OXYCONTIN) 10 mg 12 hr tablet Take 1 tablet (10 mg total) by mouth every 12 (twelve) hours. 60 tablet 0  . nicotine (NICODERM CQ - DOSED IN MG/24 HOURS) 21 mg/24hr patch Place 1 patch (21 mg total) onto the skin daily. (Patient not taking: No sig reported) 28 patch 0  . ondansetron (ZOFRAN) 8 MG tablet Take 1 tablet (8 mg total) by mouth 2 (two) times daily as needed for refractory nausea / vomiting. (Patient not taking: No sig reported) 30 tablet 1  . prochlorperazine (COMPAZINE) 10 MG tablet Take 1 tablet (10 mg total) by mouth every 6 (six) hours as needed (Nausea or vomiting). (Patient not taking: No sig reported) 30 tablet 1   No current facility-administered medications for this encounter.    ECOG PERFORMANCE STATUS:  1 - Symptomatic but completely ambulatory  REVIEW OF SYSTEMS: Patient denies any weight loss, fatigue, weakness, fever, chills or night sweats. Patient denies any loss of vision, blurred vision. Patient denies any ringing  of the ears or hearing loss. No irregular heartbeat. Patient denies heart murmur or history of fainting. Patient denies any chest pain or pain radiating to her upper extremities. Patient denies any shortness of breath, difficulty breathing at night, cough or hemoptysis. Patient denies any swelling in the lower legs. Patient denies any nausea vomiting, vomiting of blood, or coffee ground material in the vomitus. Patient denies any stomach pain. Patient states has had normal bowel movements no significant constipation or diarrhea. Patient denies any dysuria, hematuria or significant nocturia. Patient denies any problems walking, swelling in the joints or loss of balance. Patient denies any skin changes, loss of hair or loss of weight. Patient denies any excessive worrying or anxiety or significant depression. Patient denies any problems with insomnia. Patient denies excessive thirst, polyuria, polydipsia. Patient denies any swollen glands, patient  denies easy bruising or easy bleeding. Patient denies any recent infections, allergies or URI. Patient "s visual fields have not changed significantly in recent time.   PHYSICAL EXAM: BP 108/75 (BP Location: Right Arm, Patient Position: Sitting)   Pulse (!) 105   Temp (!) 96.4 F (35.8 C) (Tympanic)   Resp 16   Wt 171 lb 1.6 oz (77.6 kg)   BMI 23.86 kg/m  Deep palpation of the spine does not elicit pain.  Does have pain on deep palpation of his sacrum.  Range of motion of his lower extremities does not elicit pain.  Motor or sensory and DTR levels are equal and symmetric in the upper lower extremities.  Well-developed well-nourished patient in NAD. HEENT reveals PERLA, EOMI, discs not visualized.  Oral cavity is clear. No oral mucosal lesions are identified. Neck is clear without evidence of cervical or supraclavicular adenopathy. Lungs are clear to A&P. Cardiac examination is essentially unremarkable with regular rate and rhythm without murmur rub or thrill. Abdomen is benign with no organomegaly or masses noted. Motor sensory and  DTR levels are equal and symmetric in the upper and lower extremities. Cranial nerves II through XII are grossly intact. Proprioception is intact. No peripheral adenopathy or edema is identified. No motor or sensory levels are noted. Crude visual fields are within normal range.  LABORATORY DATA: Pathology report reviewed    RADIOLOGY RESULTS: MRI scans bone scan all reviewed compatible with above-stated findings   IMPRESSION: Widespread metastatic disease from adenocarcinoma the prostate with significant pain in his lower back sacral region in 60 year old male  PLAN: At this time elect go ahead with palliative radiation therapy to his lower L-spine as well as his sacrum since this is a major area of pain.  Mostly concerned about lesions in his bilateral hips although they are not causing significant problem at this time.  Would plan on delivering 30 Gray in 10  fractions to his lower L-spine and SI joints.  Risks and benefits of treatment including possible increased lower urinary tract symptoms diarrhea fatigue alteration of blood count skin reaction all were reviewed in detail with the patient and his wife.  I personally set up and ordered CT simulation for tomorrow.  I would like to take this opportunity to thank you for allowing me to participate in the care of your patient.Noreene Filbert, MD

## 2020-07-27 ENCOUNTER — Ambulatory Visit
Admission: RE | Admit: 2020-07-27 | Discharge: 2020-07-27 | Disposition: A | Discharge status: Home / Self Care | Payer: Self-pay | Source: Ambulatory Visit | Attending: Radiation Oncology | Admitting: Radiation Oncology

## 2020-07-27 DIAGNOSIS — C61 Malignant neoplasm of prostate: Secondary | ICD-10-CM | POA: Insufficient documentation

## 2020-07-27 DIAGNOSIS — C7951 Secondary malignant neoplasm of bone: Secondary | ICD-10-CM | POA: Insufficient documentation

## 2020-07-27 DIAGNOSIS — Z510 Encounter for antineoplastic radiation therapy: Secondary | ICD-10-CM | POA: Insufficient documentation

## 2020-07-27 NOTE — Progress Notes (Signed)
The following Medication: Eligard has been approved thru [employer] as Assistance Program. Enrollment period is 07/27/2020 to 03/03/2022.  . Reason for Assistance: Self First DOS: 08/15/2020  Madalyn Rob, CPhT IV Drug Replacement Specialist  [place] Phone: [phone]

## 2020-08-02 ENCOUNTER — Other Ambulatory Visit: Payer: Self-pay | Admitting: *Deleted

## 2020-08-02 MED ORDER — OXYCODONE HCL ER 10 MG PO T12A: 10.0000 mg | EXTENDED_RELEASE_TABLET | Freq: Two times a day (BID) | ORAL | 0 refills | Status: DC

## 2020-08-03 ENCOUNTER — Ambulatory Visit: Admission: RE | Admit: 2020-08-03 | Payer: Self-pay | Source: Ambulatory Visit

## 2020-08-03 DIAGNOSIS — C61 Malignant neoplasm of prostate: Secondary | ICD-10-CM | POA: Insufficient documentation

## 2020-08-03 DIAGNOSIS — Z510 Encounter for antineoplastic radiation therapy: Secondary | ICD-10-CM | POA: Insufficient documentation

## 2020-08-03 DIAGNOSIS — C7951 Secondary malignant neoplasm of bone: Secondary | ICD-10-CM | POA: Insufficient documentation

## 2020-08-07 ENCOUNTER — Inpatient Hospital Stay: Payer: Self-pay

## 2020-08-07 ENCOUNTER — Inpatient Hospital Stay (HOSPITAL_BASED_OUTPATIENT_CLINIC_OR_DEPARTMENT_OTHER): Payer: Self-pay | Admitting: Internal Medicine

## 2020-08-07 ENCOUNTER — Other Ambulatory Visit: Payer: Self-pay

## 2020-08-07 ENCOUNTER — Inpatient Hospital Stay: Payer: Self-pay | Attending: Internal Medicine

## 2020-08-07 ENCOUNTER — Encounter: Payer: Self-pay | Admitting: Internal Medicine

## 2020-08-07 ENCOUNTER — Ambulatory Visit
Admission: RE | Admit: 2020-08-07 | Discharge: 2020-08-07 | Disposition: A | Discharge status: Home / Self Care | Payer: Self-pay | Source: Ambulatory Visit | Attending: Radiation Oncology | Admitting: Radiation Oncology

## 2020-08-07 VITALS — BP 141/78 | HR 65

## 2020-08-07 DIAGNOSIS — C61 Malignant neoplasm of prostate: Secondary | ICD-10-CM

## 2020-08-07 DIAGNOSIS — T451X5A Adverse effect of antineoplastic and immunosuppressive drugs, initial encounter: Secondary | ICD-10-CM | POA: Insufficient documentation

## 2020-08-07 DIAGNOSIS — Z79899 Other long term (current) drug therapy: Secondary | ICD-10-CM | POA: Insufficient documentation

## 2020-08-07 DIAGNOSIS — Z5189 Encounter for other specified aftercare: Secondary | ICD-10-CM | POA: Insufficient documentation

## 2020-08-07 DIAGNOSIS — Z5111 Encounter for antineoplastic chemotherapy: Secondary | ICD-10-CM | POA: Insufficient documentation

## 2020-08-07 DIAGNOSIS — C7951 Secondary malignant neoplasm of bone: Secondary | ICD-10-CM | POA: Insufficient documentation

## 2020-08-07 DIAGNOSIS — D6481 Anemia due to antineoplastic chemotherapy: Secondary | ICD-10-CM | POA: Insufficient documentation

## 2020-08-07 DIAGNOSIS — Z7952 Long term (current) use of systemic steroids: Secondary | ICD-10-CM | POA: Insufficient documentation

## 2020-08-07 DIAGNOSIS — G893 Neoplasm related pain (acute) (chronic): Secondary | ICD-10-CM | POA: Insufficient documentation

## 2020-08-07 LAB — CBC WITH DIFFERENTIAL/PLATELET
Abs Immature Granulocytes: 0.11 10*3/uL — ABNORMAL HIGH (ref 0.00–0.07)
Basophils Absolute: 0 10*3/uL (ref 0.0–0.1)
Basophils Relative: 0 %
Eosinophils Absolute: 0 10*3/uL (ref 0.0–0.5)
Eosinophils Relative: 0 %
HCT: 26.9 % — ABNORMAL LOW (ref 39.0–52.0)
Hemoglobin: 8.7 g/dL — ABNORMAL LOW (ref 13.0–17.0)
Immature Granulocytes: 1 %
Lymphocytes Relative: 11 %
Lymphs Abs: 1 10*3/uL (ref 0.7–4.0)
MCH: 29.6 pg (ref 26.0–34.0)
MCHC: 32.3 g/dL (ref 30.0–36.0)
MCV: 91.5 fL (ref 80.0–100.0)
Monocytes Absolute: 0.8 10*3/uL (ref 0.1–1.0)
Monocytes Relative: 9 %
Neutro Abs: 6.6 10*3/uL (ref 1.7–7.7)
Neutrophils Relative %: 79 %
Platelets: 261 10*3/uL (ref 150–400)
RBC: 2.94 MIL/uL — ABNORMAL LOW (ref 4.22–5.81)
RDW: 19.5 % — ABNORMAL HIGH (ref 11.5–15.5)
WBC: 8.5 10*3/uL (ref 4.0–10.5)
nRBC: 0 % (ref 0.0–0.2)

## 2020-08-07 LAB — COMPREHENSIVE METABOLIC PANEL
ALT: 46 U/L — ABNORMAL HIGH (ref 0–44)
AST: 33 U/L (ref 15–41)
Albumin: 3.5 g/dL (ref 3.5–5.0)
Alkaline Phosphatase: 161 U/L — ABNORMAL HIGH (ref 38–126)
Anion gap: 11 (ref 5–15)
BUN: 21 mg/dL (ref 8–23)
CO2: 21 mmol/L — ABNORMAL LOW (ref 22–32)
Calcium: 8.9 mg/dL (ref 8.9–10.3)
Chloride: 105 mmol/L (ref 98–111)
Creatinine, Ser: 0.63 mg/dL (ref 0.61–1.24)
GFR, Estimated: >60 mL/min (ref >60)
Glucose, Bld: 153 mg/dL — ABNORMAL HIGH (ref 70–99)
Potassium: 4 mmol/L (ref 3.5–5.1)
Sodium: 137 mmol/L (ref 135–145)
Total Bilirubin: 0.2 mg/dL — ABNORMAL LOW (ref 0.3–1.2)
Total Protein: 6.5 g/dL (ref 6.5–8.1)

## 2020-08-07 LAB — PSA: Prostatic Specific Antigen: 0.12 ng/mL (ref 0.00–4.00)

## 2020-08-07 MED ORDER — HEPARIN SOD (PORK) LOCK FLUSH 100 UNIT/ML IV SOLN
500.0000 [IU] | Freq: Once | INTRAVENOUS | Status: AC | PRN
Start: 2020-08-07 — End: 2020-08-07
  Administered 2020-08-07: 500 [IU]
  Filled 2020-08-07: qty 5

## 2020-08-07 MED ORDER — HEPARIN SOD (PORK) LOCK FLUSH 100 UNIT/ML IV SOLN
INTRAVENOUS | Status: AC
  Filled 2020-08-07: qty 5

## 2020-08-07 MED ORDER — SODIUM CHLORIDE 0.9% FLUSH
10.0000 mL | Freq: Once | INTRAVENOUS | Status: AC
Start: 2020-08-07 — End: 2020-08-07
  Administered 2020-08-07: 10 mL via INTRAVENOUS
  Filled 2020-08-07: qty 10

## 2020-08-07 MED ORDER — SODIUM CHLORIDE 0.9 % IV SOLN
75.0000 mg/m2 | Freq: Once | INTRAVENOUS | Status: AC
  Administered 2020-08-07: 150 mg via INTRAVENOUS
  Filled 2020-08-07: qty 15

## 2020-08-07 MED ORDER — SODIUM CHLORIDE 0.9 % IV SOLN
Freq: Once | INTRAVENOUS | Status: AC
  Filled 2020-08-07: qty 250

## 2020-08-07 MED ORDER — SODIUM CHLORIDE 0.9 % IV SOLN
10.0000 mg | Freq: Once | INTRAVENOUS | Status: AC
  Administered 2020-08-07: 10 mg via INTRAVENOUS
  Filled 2020-08-07: qty 10

## 2020-08-07 NOTE — Progress Notes (Signed)
[facility] CONSULT NOTE  Patient Care Team: Patient, No Pcp Per (Inactive) as PCP - General (General Practice)  CHIEF COMPLAINTS/PURPOSE OF CONSULTATION: PROSTATE CANCER  #  Oncology History  Prostate cancer ([place])  06/14/2020 Initial Diagnosis   Prostate cancer ([place])    06/23/2020 Cancer Staging   Staging form: Prostate, AJCC 8th Edition - Clinical stage from 06/23/2020: Stage IVB (cT2c, cNX, cM1b, PSA: 678, Grade Group: 5) - Signed by Earlie Server, MD on 06/23/2020  Prostate specific antigen (PSA) range: 20 or greater  Histologic grading system: 5 grade system    07/17/2020 -  Chemotherapy    Patient is on Treatment Plan: PROSTATE DOCETAXEL Q21D          HISTORY OF PRESENTING ILLNESS:  Cody Vaughan 60 y.o.  male with metastatic prostate cancer currently on docetaxel chemotherapy plus ADT is here for follow-up.  In the interim patient also started on palliative radiation to his lower back/SI joints.  Denies any significant nausea vomiting.  Complains of fatigue.  Denies any swelling in the legs.  Denies any chest pain or shortness of the cough.   Review of Systems  Constitutional:  Positive for malaise/fatigue. Negative for chills, diaphoresis, fever and weight loss.  HENT:  Negative for nosebleeds and sore throat.   Eyes:  Negative for double vision.  Respiratory:  Negative for cough, hemoptysis, sputum production, shortness of breath and wheezing.   Cardiovascular:  Negative for chest pain, palpitations, orthopnea and leg swelling.  Gastrointestinal:  Negative for abdominal pain, blood in stool, constipation, diarrhea, heartburn, melena, nausea and vomiting.  Genitourinary:  Negative for dysuria, frequency and urgency.  Musculoskeletal:  Positive for back pain and joint pain.  Skin: Negative.  Negative for itching and rash.  Neurological:  Negative for dizziness, tingling, focal weakness, weakness and headaches.  Endo/Heme/Allergies:  Does not bruise/bleed  easily.  Psychiatric/Behavioral:  Negative for depression. The patient is not nervous/anxious and does not have insomnia.     MEDICAL HISTORY:  Past Medical History:  Diagnosis Date   Prostate cancer ([place])    2022    SURGICAL HISTORY: Past Surgical History:  Procedure Laterality Date   PORTA CATH INSERTION N/A 07/10/2020   Procedure: PORTA CATH INSERTION;  Surgeon: Algernon Huxley, MD;  Location: [facility];  Service: Cardiovascular;  Laterality: N/A;   PROSTATE BIOPSY N/A 06/16/2020   Procedure: PROSTATE BIOPSY;  Surgeon: Billey Co, MD;  Location: [facility];  Service: Urology;  Laterality: N/A;   SHOULDER SURGERY     1985   TRANSRECTAL ULTRASOUND N/A 06/16/2020   Procedure: TRANSRECTAL ULTRASOUND;  Surgeon: Billey Co, MD;  Location: [facility];  Service: Urology;  Laterality: N/A;    SOCIAL HISTORY: Social History   Socioeconomic History   Marital status: Married    Spouse name: Not on file   Number of children: Not on file   Years of education: Not on file   Highest education level: Not on file  Occupational History   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  Tobacco Use   Smoking status: Every Day    Packs/day: 0.75    Pack years: 0.00    Types: Cigarettes   Smokeless tobacco: Never  Substance and Sexual Activity   Alcohol use: Not Currently   Drug use: Never   Sexual activity: Not Currently  Other Topics Concern   Not on file  Social History Narrative   Not on  file   Social Determinants of Health   Financial Resource Strain: Not on file  Food Insecurity: Not on file  Transportation Needs: Not on file  Physical Activity: Not on file  Stress: Not on file  Social Connections: Not on file  Intimate Partner Violence: Not on file    FAMILY HISTORY: Family History  Problem Relation Age of Onset   COPD Mother    Heart attack Father     ALLERGIES:  has No Known Allergies.  MEDICATIONS:  Current Outpatient  Medications  Medication Sig Dispense Refill   Calcium 600-400 MG-UNIT CHEW Chew 2 tablets by mouth daily. 90 tablet 0   dexamethasone (DECADRON) 4 MG tablet Take 2 tablets (8 mg total) by mouth 2 (two) times daily. Start the day before Taxotere. Then daily after chemo for 2 days. 30 tablet 1   lidocaine-prilocaine (EMLA) cream Apply to affected area once 30 g 3   meloxicam (MOBIC) 7.5 MG tablet Take 1 tablet (7.5 mg total) by mouth daily as needed for pain. 30 tablet 1   mirtazapine (REMERON) 15 MG tablet Take 1 tablet (15 mg total) by mouth at bedtime. 30 tablet 2   Multiple Vitamins-Minerals (MULTIVITAMIN WITH MINERALS) tablet Take 1 tablet by mouth daily.     nicotine (NICODERM CQ - DOSED IN MG/24 HOURS) 21 mg/24hr patch Place 1 patch (21 mg total) onto the skin daily. 28 patch 0   omeprazole (PRILOSEC) 20 MG capsule Take 1 capsule (20 mg total) by mouth daily. 30 capsule 1   ondansetron (ZOFRAN) 8 MG tablet Take 1 tablet (8 mg total) by mouth 2 (two) times daily as needed for refractory nausea / vomiting. 30 tablet 1   oxyCODONE (OXYCONTIN) 10 mg 12 hr tablet Take 1 tablet (10 mg total) by mouth every 12 (twelve) hours. 60 tablet 0   acetaminophen (TYLENOL) 325 MG tablet Take 650 mg by mouth every 6 (six) hours as needed for moderate pain or mild pain. (Patient not taking: Reported on 08/07/2020)     oxyCODONE (OXY IR/ROXICODONE) 5 MG immediate release tablet Take 1-2 tablets (5-10 mg total) by mouth every 4 (four) hours as needed for severe pain. 60 tablet 0   prochlorperazine (COMPAZINE) 10 MG tablet Take 1 tablet (10 mg total) by mouth every 6 (six) hours as needed (Nausea or vomiting). (Patient not taking: No sig reported) 30 tablet 1   No current facility-administered medications for this visit.      Marland Kitchen  PHYSICAL EXAMINATION: ECOG PERFORMANCE STATUS: 1 - Symptomatic but completely ambulatory  Vitals:   08/07/20 0850  BP: (!) 149/90  Pulse: 78  Resp: 16  Temp: 97.7 F (36.5 C)   SpO2: 100%   Filed Weights   08/07/20 0850  Weight: 174 lb (78.9 kg)    Physical Exam HENT:     Head: Normocephalic and atraumatic.     Mouth/Throat:     Pharynx: No oropharyngeal exudate.  Eyes:     Pupils: Pupils are equal, round, and reactive to light.  Cardiovascular:     Rate and Rhythm: Normal rate and regular rhythm.  Pulmonary:     Effort: No respiratory distress.     Breath sounds: No wheezing.  Abdominal:     General: Bowel sounds are normal. There is no distension.     Palpations: Abdomen is soft. There is no mass.     Tenderness: There is no abdominal tenderness. There is no guarding or rebound.  Musculoskeletal:  General: No tenderness. Normal range of motion.     Cervical back: Normal range of motion and neck supple.  Skin:    General: Skin is warm.  Neurological:     Mental Status: He is alert and oriented to person, place, and time.  Psychiatric:        Mood and Affect: Affect normal.     LABORATORY DATA:  I have reviewed the data as listed Lab Results  Component Value Date   WBC 11.4 (H) 08/14/2020   HGB 9.4 (L) 08/14/2020   HCT 28.8 (L) 08/14/2020   MCV 90.6 08/14/2020   PLT 260 08/14/2020   Recent Labs    07/17/20 0852 07/24/20 0833 08/07/20 0837  NA 134* 137 137  K 3.9 4.0 4.0  CL 103 104 105  CO2 22 22 21*  GLUCOSE 174* 130* 153*  BUN 21 15 21   CREATININE 0.78 0.79 0.63  CALCIUM 8.9 8.4* 8.9  GFRNONAA >60 >60 >60  PROT 7.2 6.1* 6.5  ALBUMIN 3.6 3.4* 3.5  AST 30 31 33  ALT 36 40 46*  ALKPHOS 381* 293* 161*  BILITOT 0.5 0.4 0.2*    RADIOGRAPHIC STUDIES: I have personally reviewed the radiological images as listed and agreed with the findings in the report. No results found.   ASSESSMENT & PLAN:   Prostate cancer [facility]) #Metastatic prostate cancer; on ADT-proceed with docetaxel; tolerating well without any major side effects.  Proceed with cycle #2. Labs today reviewed;  acceptable for treatment today.   #Anemia/sec  to chemotherapy- hemoglobin has dropped to 8.5; recommend possible PRBC trasnfsion in 1 week.   #Extensive bone metastasis, neoplasm related pain. Continue OxyContin twice daily.  Oxycodone 5 mg tablet every 4 hours as needed. [2-4 days]; currently on palliative radiation until 6/17;   # DISPOSITION: # chemo today;  # labs in 1 week- cbc/bmp/hold tube- possible 1unit of PRBC # follow up in 3 weeks- MD-Dr.Yu; labs- cbc/cmp/psa; taxoetere; D-2- fulphila-Dr.B     All questions were answered. The patient knows to call the clinic with any problems, questions or concerns.       Cammie Sickle, MD 08/20/2020 6:08 PM

## 2020-08-07 NOTE — Patient Instructions (Signed)
[facility]COL[facility]   Discharge Instructions: Thank you for choosing [facility] to provide your oncology and hematology care.  If you have a lab appointment with the [facility], please go directly to the [facility] and check in at the registration area.  Wear comfortable clothing and clothing appropriate for easy access to any Portacath or PICC line.   We strive to give you quality time with your provider. You may need to reschedule your appointment if you arrive late (15 or more minutes).  Arriving late affects you and other patients whose appointments are after yours.  Also, if you miss three or more appointments without notifying the office, you may be dismissed from the clinic at the provider's discretion.      For prescription refill requests, have your pharmacy contact our office and allow 72 hours for refills to be completed.    Today you received the following chemotherapy and/or immunotherapy agents Docetaxel  Docetaxel injection What is this medicine? DOCETAXEL (doe se TAX el) is a chemotherapy drug. It targets fast dividing cells, like cancer cells, and causes these cells to die. This medicine is used to treat many types of cancers like breast cancer, certain stomach cancers, head and neck cancer, lung cancer, and prostate cancer. This medicine may be used for other purposes; ask your health care provider or pharmacist if you have questions. COMMON BRAND NAME(S): Docefrez, Taxotere What should I tell my health care provider before I take this medicine? They need to know if you have any of these conditions:  infection (especially a virus infection such as chickenpox, cold sores, or herpes)  liver disease  low blood counts, like low white cell, platelet, or red cell counts  an unusual or allergic reaction to docetaxel, polysorbate 80, other chemotherapy agents, other medicines, foods, dyes, or preservatives  pregnant or trying to  get pregnant  breast-feeding How should I use this medicine? This drug is given as an infusion into a vein. It is administered in a hospital or clinic by a specially trained health care professional. Talk to your pediatrician regarding the use of this medicine in children. Special care may be needed. Overdosage: If you think you have taken too much of this medicine contact a poison control center or emergency room at once. NOTE: This medicine is only for you. Do not share this medicine with others. What if I miss a dose? It is important not to miss your dose. Call your doctor or health care professional if you are unable to keep an appointment. What may interact with this medicine? Do not take this medicine with any of the following medications:  live virus vaccines This medicine may also interact with the following medications:  aprepitant  certain antibiotics like erythromycin or clarithromycin  certain antivirals for HIV or hepatitis  certain medicines for fungal infections like fluconazole, itraconazole, ketoconazole, posaconazole, or voriconazole  cimetidine  ciprofloxacin  conivaptan  cyclosporine  dronedarone  fluvoxamine  grapefruit juice  imatinib  verapamil This list may not describe all possible interactions. Give your health care provider a list of all the medicines, herbs, non-prescription drugs, or dietary supplements you use. Also tell them if you smoke, drink alcohol, or use illegal drugs. Some items may interact with your medicine. What should I watch for while using this medicine? Your condition will be monitored carefully while you are receiving this medicine. You will need important blood work done while you are taking this medicine. Call  your doctor or health care professional for advice if you get a fever, chills or sore throat, or other symptoms of a cold or flu. Do not treat yourself. This drug decreases your body's ability to fight infections. Try  to avoid being around people who are sick. Some products may contain alcohol. Ask your health care professional if this medicine contains alcohol. Be sure to tell all health care professionals you are taking this medicine. Certain medicines, like metronidazole and disulfiram, can cause an unpleasant reaction when taken with alcohol. The reaction includes flushing, headache, nausea, vomiting, sweating, and increased thirst. The reaction can last from 30 minutes to several hours. You may get drowsy or dizzy. Do not drive, use machinery, or do anything that needs mental alertness until you know how this medicine affects you. Do not stand or sit up quickly, especially if you are an older patient. This reduces the risk of dizzy or fainting spells. Alcohol may interfere with the effect of this medicine. Talk to your health care professional about your risk of cancer. You may be more at risk for certain types of cancer if you take this medicine. Do not become pregnant while taking this medicine or for 6 months after stopping it. Women should inform their doctor if they wish to become pregnant or think they might be pregnant. There is a potential for serious side effects to an unborn child. Talk to your health care professional or pharmacist for more information. Do not breast-feed an infant while taking this medicine or for 1 week after stopping it. Males who get this medicine must use a condom during sex with females who can get pregnant. If you get a woman pregnant, the baby could have birth defects. The baby could die before they are born. You will need to continue wearing a condom for 3 months after stopping the medicine. Tell your health care provider right away if your partner becomes pregnant while you are taking this medicine. This may interfere with the ability to father a child. You should talk to your doctor or health care professional if you are concerned about your fertility. What side effects may I  notice from receiving this medicine? Side effects that you should report to your doctor or health care professional as soon as possible:  allergic reactions like skin rash, itching or hives, swelling of the face, lips, or tongue  blurred vision  breathing problems  changes in vision  low blood counts - This drug may decrease the number of white blood cells, red blood cells and platelets. You may be at increased risk for infections and bleeding.  nausea and vomiting  pain, redness or irritation at site where injected  pain, tingling, numbness in the hands or feet  redness, blistering, peeling, or loosening of the skin, including inside the mouth  signs of decreased platelets or bleeding - bruising, pinpoint red spots on the skin, black, tarry stools, nosebleeds  signs of decreased red blood cells - unusually weak or tired, fainting spells, lightheadedness  signs of infection - fever or chills, cough, sore throat, pain or difficulty passing urine  swelling of the ankle, feet, hands Side effects that usually do not require medical attention (report to your doctor or health care professional if they continue or are bothersome):  constipation  diarrhea  fingernail or toenail changes  hair loss  loss of appetite  mouth sores  muscle pain This list may not describe all possible side effects. Call your doctor for medical  advice about side effects. You may report side effects to FDA at 1-800-FDA-1088. Where should I keep my medicine? This drug is given in a hospital or clinic and will not be stored at home. NOTE: This sheet is a summary. It may not cover all possible information. If you have questions about this medicine, talk to your doctor, pharmacist, or health care provider.  2021 Elsevier/Gold Standard (2019-01-18 19:50:31)       To help prevent nausea and vomiting after your treatment, we encourage you to take your nausea medication as directed.  BELOW ARE SYMPTOMS  THAT SHOULD BE REPORTED IMMEDIATELY: . *FEVER GREATER THAN 100.4 F (38 C) OR HIGHER . *CHILLS OR SWEATING . *NAUSEA AND VOMITING THAT IS NOT CONTROLLED WITH YOUR NAUSEA MEDICATION . *UNUSUAL SHORTNESS OF BREATH . *UNUSUAL BRUISING OR BLEEDING . *URINARY PROBLEMS (pain or burning when urinating, or frequent urination) . *BOWEL PROBLEMS (unusual diarrhea, constipation, pain near the anus) . TENDERNESS IN MOUTH AND THROAT WITH OR WITHOUT PRESENCE OF ULCERS (sore throat, sores in mouth, or a toothache) . UNUSUAL RASH, SWELLING OR PAIN  . UNUSUAL VAGINAL DISCHARGE OR ITCHING   Items with * indicate a potential emergency and should be followed up as soon as possible or go to the Emergency Department if any problems should occur.  Please show the CHEMOTHERAPY ALERT CARD or IMMUNOTHERAPY ALERT CARD at check-in to the Emergency Department and triage nurse.  Should you have questions after your visit or need to cancel or reschedule your appointment, please contact [facility]  [phone] and follow the prompts.  Office hours are 8:00 a.m. to 4:30 p.m. Monday - Friday. Please note that voicemails left after 4:00 p.m. may not be returned until the following business day.  We are closed weekends and major holidays. You have access to a nurse at all times for urgent questions. Please call the main number to the clinic [phone] and follow the prompts.  For any non-urgent questions, you may also contact your provider using MyChart. We now offer e-Visits for anyone 46 and older to request care online for non-urgent symptoms. For details visit [email or web address].   Also download the MyChart app! Go to the app store, search "MyChart", open the app, select [facility], and log in with your MyChart username and password.  Due to Covid, a mask is required upon entering the hospital/clinic. If you do not have a mask, one will be given to you upon arrival. For doctor  visits, patients may have 1 support person aged 59 or older with them. For treatment visits, patients cannot have anyone with them due to current Covid guidelines and our immunocompromised population.

## 2020-08-07 NOTE — Progress Notes (Signed)
Nutrition Follow-up:  Patient with metastatic prostate cancer.  Patient receiving docetaxel.    Met with patient during infusion.  Patient reports appetite continues to be good.  "I eat all the time." Denies nutrition impact symptoms. Currently working but taking breaks during the day.  Staying hydrated.  Drinking boost 3 times per day    Medications: reviewed  Labs: reviewed  Anthropometrics:   Weight 174 lb increased  170 lb 4.8 oz on 5/23 164 lb 12.8 oz on 4/22 173 lb on 3/26   NUTRITION DIAGNOSIS: Inadequate oral intake improved   INTERVENTION:  Continue with high calorie, high protein foods Continue with oral nutrition supplements    MONITORING, EVALUATION, GOAL: weight trends, intake   NEXT VISIT: to be determined with treatment  Cody Vaughan, [place], [place] Registered Dietitian [phone] (mobile)

## 2020-08-07 NOTE — Assessment & Plan Note (Addendum)
#  Metastatic prostate cancer; on ADT-proceed with docetaxel; tolerating well without any major side effects.  Proceed with cycle #2. Labs today reviewed;  acceptable for treatment today.   #Anemia/sec to chemotherapy- hemoglobin has dropped to 8.5; recommend possible PRBC trasnfsion in 1 week.   #Extensive bone metastasis, neoplasm related pain. Continue OxyContin twice daily.  Oxycodone 5 mg tablet every 4 hours as needed. [2-4 days]; currently on palliative radiation until 6/17;   # DISPOSITION: # chemo today;  # labs in 1 week- cbc/bmp/hold tube- possible 1unit of PRBC # follow up in 3 weeks- MD-Dr.Yu; labs- cbc/cmp/psa; taxoetere; D-2- fulphila-Dr.B

## 2020-08-08 ENCOUNTER — Ambulatory Visit
Admission: RE | Admit: 2020-08-08 | Discharge: 2020-08-08 | Disposition: A | Discharge status: Home / Self Care | Payer: Self-pay | Source: Ambulatory Visit | Attending: Radiation Oncology | Admitting: Radiation Oncology

## 2020-08-09 ENCOUNTER — Ambulatory Visit
Admission: RE | Admit: 2020-08-09 | Discharge: 2020-08-09 | Disposition: A | Discharge status: Home / Self Care | Payer: Self-pay | Source: Ambulatory Visit | Attending: Radiation Oncology | Admitting: Radiation Oncology

## 2020-08-09 ENCOUNTER — Inpatient Hospital Stay: Payer: Self-pay

## 2020-08-09 ENCOUNTER — Other Ambulatory Visit: Payer: Self-pay

## 2020-08-09 DIAGNOSIS — C61 Malignant neoplasm of prostate: Secondary | ICD-10-CM

## 2020-08-09 MED ORDER — PEGFILGRASTIM-JMDB 6 MG/0.6ML ~~LOC~~ SOSY
6.0000 mg | PREFILLED_SYRINGE | Freq: Once | SUBCUTANEOUS | Status: AC
  Administered 2020-08-09: 6 mg via SUBCUTANEOUS
  Filled 2020-08-09: qty 0.6

## 2020-08-10 ENCOUNTER — Ambulatory Visit
Admission: RE | Admit: 2020-08-10 | Discharge: 2020-08-10 | Disposition: A | Discharge status: Home / Self Care | Payer: Self-pay | Source: Ambulatory Visit | Attending: Radiation Oncology | Admitting: Radiation Oncology

## 2020-08-11 ENCOUNTER — Ambulatory Visit
Admission: RE | Admit: 2020-08-11 | Discharge: 2020-08-11 | Disposition: A | Discharge status: Home / Self Care | Payer: Self-pay | Source: Ambulatory Visit | Attending: Radiation Oncology | Admitting: Radiation Oncology

## 2020-08-14 ENCOUNTER — Other Ambulatory Visit: Payer: Self-pay

## 2020-08-14 ENCOUNTER — Inpatient Hospital Stay: Payer: Self-pay

## 2020-08-14 ENCOUNTER — Ambulatory Visit
Admission: RE | Admit: 2020-08-14 | Discharge: 2020-08-14 | Disposition: A | Discharge status: Home / Self Care | Payer: Self-pay | Source: Ambulatory Visit | Attending: Radiation Oncology | Admitting: Radiation Oncology

## 2020-08-14 DIAGNOSIS — Z95828 Presence of other vascular implants and grafts: Secondary | ICD-10-CM

## 2020-08-14 DIAGNOSIS — C61 Malignant neoplasm of prostate: Secondary | ICD-10-CM

## 2020-08-14 LAB — CBC WITH DIFFERENTIAL/PLATELET
Abs Immature Granulocytes: 1.48 10*3/uL — ABNORMAL HIGH (ref 0.00–0.07)
Basophils Absolute: 0.1 10*3/uL (ref 0.0–0.1)
Basophils Relative: 1 %
Eosinophils Absolute: 0.3 10*3/uL (ref 0.0–0.5)
Eosinophils Relative: 3 %
HCT: 28.8 % — ABNORMAL LOW (ref 39.0–52.0)
Hemoglobin: 9.4 g/dL — ABNORMAL LOW (ref 13.0–17.0)
Immature Granulocytes: 13 %
Lymphocytes Relative: 14 %
Lymphs Abs: 1.6 10*3/uL (ref 0.7–4.0)
MCH: 29.6 pg (ref 26.0–34.0)
MCHC: 32.6 g/dL (ref 30.0–36.0)
MCV: 90.6 fL (ref 80.0–100.0)
Monocytes Absolute: 2.3 10*3/uL — ABNORMAL HIGH (ref 0.1–1.0)
Monocytes Relative: 20 %
Neutro Abs: 5.7 10*3/uL (ref 1.7–7.7)
Neutrophils Relative %: 49 %
Platelets: 260 10*3/uL (ref 150–400)
RBC: 3.18 MIL/uL — ABNORMAL LOW (ref 4.22–5.81)
RDW: 19.4 % — ABNORMAL HIGH (ref 11.5–15.5)
Smear Review: NORMAL
WBC: 11.4 10*3/uL — ABNORMAL HIGH (ref 4.0–10.5)
nRBC: 0.9 % — ABNORMAL HIGH (ref 0.0–0.2)

## 2020-08-14 LAB — SAMPLE TO BLOOD BANK

## 2020-08-14 MED ORDER — HEPARIN SOD (PORK) LOCK FLUSH 100 UNIT/ML IV SOLN
500.0000 [IU] | Freq: Once | INTRAVENOUS | Status: AC
  Administered 2020-08-14: 500 [IU] via INTRAVENOUS
  Filled 2020-08-14: qty 5

## 2020-08-14 MED ORDER — HEPARIN SOD (PORK) LOCK FLUSH 100 UNIT/ML IV SOLN
INTRAVENOUS | Status: AC
  Filled 2020-08-14: qty 5

## 2020-08-14 MED ORDER — SODIUM CHLORIDE 0.9% FLUSH
10.0000 mL | Freq: Once | INTRAVENOUS | Status: AC
  Administered 2020-08-14: 10 mL via INTRAVENOUS
  Filled 2020-08-14: qty 10

## 2020-08-15 ENCOUNTER — Ambulatory Visit
Admission: RE | Admit: 2020-08-15 | Discharge: 2020-08-15 | Disposition: A | Discharge status: Home / Self Care | Payer: Self-pay | Source: Ambulatory Visit | Attending: Radiation Oncology | Admitting: Radiation Oncology

## 2020-08-15 ENCOUNTER — Other Ambulatory Visit: Payer: Self-pay | Admitting: *Deleted

## 2020-08-15 ENCOUNTER — Inpatient Hospital Stay: Payer: Self-pay

## 2020-08-15 ENCOUNTER — Other Ambulatory Visit: Payer: Self-pay

## 2020-08-15 DIAGNOSIS — C61 Malignant neoplasm of prostate: Secondary | ICD-10-CM

## 2020-08-15 MED ORDER — OXYCODONE HCL 5 MG PO TABS: 5.0000 mg | ORAL_TABLET | ORAL | 0 refills | Status: DC | PRN

## 2020-08-15 MED ORDER — LEUPROLIDE ACETATE (6 MONTH) 45 MG ~~LOC~~ KIT
45.0000 mg | PACK | Freq: Once | SUBCUTANEOUS | Status: AC
  Administered 2020-08-15: 45 mg via SUBCUTANEOUS
  Filled 2020-08-15: qty 45

## 2020-08-16 ENCOUNTER — Ambulatory Visit
Admission: RE | Admit: 2020-08-16 | Discharge: 2020-08-16 | Disposition: A | Discharge status: Home / Self Care | Payer: Self-pay | Source: Ambulatory Visit | Attending: Radiation Oncology | Admitting: Radiation Oncology

## 2020-08-17 ENCOUNTER — Ambulatory Visit
Admission: RE | Admit: 2020-08-17 | Discharge: 2020-08-17 | Disposition: A | Discharge status: Home / Self Care | Payer: Self-pay | Source: Ambulatory Visit | Attending: Radiation Oncology | Admitting: Radiation Oncology

## 2020-08-18 ENCOUNTER — Ambulatory Visit
Admission: RE | Admit: 2020-08-18 | Discharge: 2020-08-18 | Disposition: A | Discharge status: Home / Self Care | Payer: Self-pay | Source: Ambulatory Visit | Attending: Radiation Oncology | Admitting: Radiation Oncology

## 2020-08-20 ENCOUNTER — Encounter: Payer: Self-pay | Admitting: Oncology

## 2020-08-23 ENCOUNTER — Other Ambulatory Visit: Payer: Self-pay | Admitting: Oncology

## 2020-08-24 ENCOUNTER — Other Ambulatory Visit: Payer: Self-pay | Admitting: *Deleted

## 2020-08-25 ENCOUNTER — Encounter: Payer: Self-pay | Admitting: Oncology

## 2020-08-28 ENCOUNTER — Encounter: Payer: Self-pay | Admitting: Oncology

## 2020-08-28 ENCOUNTER — Other Ambulatory Visit: Payer: Self-pay

## 2020-08-28 ENCOUNTER — Inpatient Hospital Stay: Payer: Self-pay

## 2020-08-28 ENCOUNTER — Inpatient Hospital Stay (HOSPITAL_BASED_OUTPATIENT_CLINIC_OR_DEPARTMENT_OTHER): Payer: Self-pay | Admitting: Oncology

## 2020-08-28 VITALS — BP 110/68 | HR 68 | Temp 98.0°F | Resp 20 | Wt 169.4 lb

## 2020-08-28 DIAGNOSIS — Z79818 Long term (current) use of other agents affecting estrogen receptors and estrogen levels: Secondary | ICD-10-CM

## 2020-08-28 DIAGNOSIS — G893 Neoplasm related pain (acute) (chronic): Secondary | ICD-10-CM

## 2020-08-28 DIAGNOSIS — Z5111 Encounter for antineoplastic chemotherapy: Secondary | ICD-10-CM

## 2020-08-28 DIAGNOSIS — C61 Malignant neoplasm of prostate: Secondary | ICD-10-CM

## 2020-08-28 DIAGNOSIS — Z95828 Presence of other vascular implants and grafts: Secondary | ICD-10-CM

## 2020-08-28 LAB — COMPREHENSIVE METABOLIC PANEL
ALT: 32 U/L (ref 0–44)
AST: 24 U/L (ref 15–41)
Albumin: 2.8 g/dL — ABNORMAL LOW (ref 3.5–5.0)
Alkaline Phosphatase: 86 U/L (ref 38–126)
Anion gap: 8 (ref 5–15)
BUN: 17 mg/dL (ref 8–23)
CO2: 24 mmol/L (ref 22–32)
Calcium: 8.3 mg/dL — ABNORMAL LOW (ref 8.9–10.3)
Chloride: 106 mmol/L (ref 98–111)
Creatinine, Ser: 0.62 mg/dL (ref 0.61–1.24)
GFR, Estimated: >60 mL/min (ref >60)
Glucose, Bld: 135 mg/dL — ABNORMAL HIGH (ref 70–99)
Potassium: 4 mmol/L (ref 3.5–5.1)
Sodium: 138 mmol/L (ref 135–145)
Total Bilirubin: 0.3 mg/dL (ref 0.3–1.2)
Total Protein: 5.4 g/dL — ABNORMAL LOW (ref 6.5–8.1)

## 2020-08-28 LAB — CBC WITH DIFFERENTIAL/PLATELET
Abs Immature Granulocytes: 0.05 10*3/uL (ref 0.00–0.07)
Basophils Absolute: 0 10*3/uL (ref 0.0–0.1)
Basophils Relative: 0 %
Eosinophils Absolute: 0 10*3/uL (ref 0.0–0.5)
Eosinophils Relative: 0 %
HCT: 27.5 % — ABNORMAL LOW (ref 39.0–52.0)
Hemoglobin: 9 g/dL — ABNORMAL LOW (ref 13.0–17.0)
Immature Granulocytes: 1 %
Lymphocytes Relative: 7 %
Lymphs Abs: 0.5 10*3/uL — ABNORMAL LOW (ref 0.7–4.0)
MCH: 29.8 pg (ref 26.0–34.0)
MCHC: 32.7 g/dL (ref 30.0–36.0)
MCV: 91.1 fL (ref 80.0–100.0)
Monocytes Absolute: 0.9 10*3/uL (ref 0.1–1.0)
Monocytes Relative: 12 %
Neutro Abs: 5.5 10*3/uL (ref 1.7–7.7)
Neutrophils Relative %: 80 %
Platelets: 194 10*3/uL (ref 150–400)
RBC: 3.02 MIL/uL — ABNORMAL LOW (ref 4.22–5.81)
RDW: 18.9 % — ABNORMAL HIGH (ref 11.5–15.5)
WBC: 6.9 10*3/uL (ref 4.0–10.5)
nRBC: 0 % (ref 0.0–0.2)

## 2020-08-28 LAB — PSA: Prostatic Specific Antigen: 0.04 ng/mL (ref 0.00–4.00)

## 2020-08-28 MED ORDER — HEPARIN SOD (PORK) LOCK FLUSH 100 UNIT/ML IV SOLN
500.0000 [IU] | Freq: Once | INTRAVENOUS | Status: AC | PRN
Start: 2020-08-28 — End: 2020-08-28
  Administered 2020-08-28: 500 [IU]
  Filled 2020-08-28: qty 5

## 2020-08-28 MED ORDER — CALCIUM 600-400 MG-UNIT PO CHEW: 2.0000 | CHEWABLE_TABLET | Freq: Every day | ORAL | 3 refills | Status: AC

## 2020-08-28 MED ORDER — SODIUM CHLORIDE 0.9 % IV SOLN
10.0000 mg | Freq: Once | INTRAVENOUS | Status: AC
  Administered 2020-08-28: 10 mg via INTRAVENOUS
  Filled 2020-08-28: qty 10

## 2020-08-28 MED ORDER — SODIUM CHLORIDE 0.9 % IV SOLN
75.0000 mg/m2 | Freq: Once | INTRAVENOUS | Status: AC
  Administered 2020-08-28: 150 mg via INTRAVENOUS
  Filled 2020-08-28: qty 15

## 2020-08-28 MED ORDER — SODIUM CHLORIDE 0.9 % IV SOLN
Freq: Once | INTRAVENOUS | Status: AC
  Filled 2020-08-28: qty 250

## 2020-08-28 MED ORDER — OMEPRAZOLE 20 MG PO CPDR: 20.0000 mg | DELAYED_RELEASE_CAPSULE | Freq: Every day | ORAL | 1 refills | Status: DC

## 2020-08-28 NOTE — Patient Instructions (Signed)
[facility]  Discharge Instructions: Thank you for choosing [facility] to provide your oncology and hematology care.  If you have a lab appointment with the [facility], please go directly to the [facility] and check in at the registration area.  Wear comfortable clothing and clothing appropriate for easy access to any Portacath or PICC line.   We strive to give you quality time with your provider. You may need to reschedule your appointment if you arrive late (15 or more minutes).  Arriving late affects you and other patients whose appointments are after yours.  Also, if you miss three or more appointments without notifying the office, you may be dismissed from the clinic at the provider's discretion.      For prescription refill requests, have your pharmacy contact our office and allow 72 hours for refills to be completed.    Today you received the following chemotherapy and/or immunotherapy agents: Taxotere      To help prevent nausea and vomiting after your treatment, we encourage you to take your nausea medication as directed.  BELOW ARE SYMPTOMS THAT SHOULD BE REPORTED IMMEDIATELY: *FEVER GREATER THAN 100.4 F (38 C) OR HIGHER *CHILLS OR SWEATING *NAUSEA AND VOMITING THAT IS NOT CONTROLLED WITH YOUR NAUSEA MEDICATION *UNUSUAL SHORTNESS OF BREATH *UNUSUAL BRUISING OR BLEEDING *URINARY PROBLEMS (pain or burning when urinating, or frequent urination) *BOWEL PROBLEMS (unusual diarrhea, constipation, pain near the anus) TENDERNESS IN MOUTH AND THROAT WITH OR WITHOUT PRESENCE OF ULCERS (sore throat, sores in mouth, or a toothache) UNUSUAL RASH, SWELLING OR PAIN  UNUSUAL VAGINAL DISCHARGE OR ITCHING   Items with * indicate a potential emergency and should be followed up as soon as possible or go to the Emergency Department if any problems should occur.  Please show the CHEMOTHERAPY ALERT CARD or IMMUNOTHERAPY ALERT CARD at check-in  to the Emergency Department and triage nurse.  Should you have questions after your visit or need to cancel or reschedule your appointment, please contact [facility]  [phone] and follow the prompts.  Office hours are 8:00 a.m. to 4:30 p.m. Monday - Friday. Please note that voicemails left after 4:00 p.m. may not be returned until the following business day.  We are closed weekends and major holidays. You have access to a nurse at all times for urgent questions. Please call the main number to the clinic [phone] and follow the prompts.  For any non-urgent questions, you may also contact your provider using MyChart. We now offer e-Visits for anyone 64 and older to request care online for non-urgent symptoms. For details visit [email or web address].   Also download the MyChart app! Go to the app store, search "MyChart", open the app, select [facility], and log in with your MyChart username and password.  Due to Covid, a mask is required upon entering the hospital/clinic. If you do not have a mask, one will be given to you upon arrival. For doctor visits, patients may have 1 support person aged 72 or older with them. For treatment visits, patients cannot have anyone with them due to current Covid guidelines and our immunocompromised population.

## 2020-08-28 NOTE — Progress Notes (Signed)
Hematology/Oncology progress note [facility] Telephone:([phone] [phone]   Patient Care Team: Patient, No Pcp Per (Inactive) as PCP - General (General Practice)  REFERRING PROVIDER: No ref. provider found  CHIEF COMPLAINTS/REASON FOR VISIT:  Follow-up for prostate cancer treatment  HISTORY OF PRESENTING ILLNESS:   Cody Vaughan is a  60 y.o.  male with PMH listed below was seen in consultation at the request of  No ref. provider found  for evaluation of bone lesions 05/27/2020 patient presented to emergency room for evaluation of generalized weakness and and body pain.  Prior to the presentations, patient also had history of tooth abscess and was prescribed antibiotics. Reports acute on chronic right-sided lumbar pain without any prior trauma history.  Profound weakness.  Decreased appetite and loss of weight about 10 pounds during the past few months.  Also complained intermittent right upper quadrant discomfort  326 04/10/2020 CT abdomen pelvis showed a diffusely patchy sclerotic appearance of the osseous structures suspicious for osseous metastatic disease.  Prostate gland is heterogeneous in appearance.  Correlate with PSA.  Patient had a mild superior endplate compression deformity of L4 with less than 10% vertebral body height loss.  H indeterminate.  Fusiform infrarenal abdominal aortic aneurysm measuring up to 3.5 cm.  Follow-up in 2 years. Suspicious findings for early acute uncomplicated appendicitis.  Patient denies any right lower quadrant pain.  In the emergency room, he has a negative Murphy sign.  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  Highest Gleason score 5+4 Postprocedure, patient has had hematuria and hematochezia, bilateral lower extremity pain, AKI orthostatic hypovolemia, sepsis and was admitted and treated.  Patient was discharged on a course of  antibiotics.  06/18/2020 MRI thoracic, lumbar, sacrum is compatible with diffuse osseous metastasis in the visualized thoracic lumbar spine, sacrum and bilateral iliac bones.  No definitive dural based tumor is identified.  No compression fracture.  Spine spondylosis  06/20/2019, patient received loading dose of Firmagon 240 mg x 1. #06/22/2020, bone scan showed widespread osseous metastatic disease involving the appendicular and axial skeleton #April 2022. COVID infection. #07/10/2020, Dr. Lucky Cowboy placed Mediport #07/17/2020, cycle 1 docetaxel.  Firmagon x1  #INTERVAL HISTORY Cody Vaughan is a 60 y.o. male who has above history reviewed by me today presents for follow up visit for management of metastatic prostate cancer Problems and complaints are listed below Status post cycle 2 docetaxel treats.    Bone pain after G-CSF for 2 days. He experienced 1 week of diarrhea which improved with Imodium.  S/p palliative RT to his hip pain has improved.   He takes 1 OxyContin in the morning and no need for short acting oxycodone on most days.  Pain is well controlled.  He continues to work  Review of Systems  Constitutional:  Positive for fatigue. Negative for appetite change, chills, fever and unexpected weight change.       Patient walks independently  HENT:   Negative for hearing loss and voice change.   Eyes:  Negative for eye problems and icterus.  Respiratory:  Negative for chest tightness, cough and shortness of breath.   Cardiovascular:  Negative for chest pain and leg swelling.  Gastrointestinal:  Negative for abdominal distention and abdominal pain.  Endocrine: Positive for hot flashes.  Genitourinary:  Negative for difficulty urinating, dysuria and frequency.   Musculoskeletal:  Negative for arthralgias and back pain.  Hip pain  Skin:  Negative for itching and rash.  Neurological:  Negative for light-headedness and numbness.  Hematological:  Negative for adenopathy. Does not  bruise/bleed easily.  Psychiatric/Behavioral:  Negative for confusion.    MEDICAL HISTORY:  Past Medical History:  Diagnosis Date   Prostate cancer ([place])    2022    SURGICAL HISTORY: Past Surgical History:  Procedure Laterality Date   PORTA CATH INSERTION N/A 07/10/2020   Procedure: PORTA CATH INSERTION;  Surgeon: Algernon Huxley, MD;  Location: [facility];  Service: Cardiovascular;  Laterality: N/A;   PROSTATE BIOPSY N/A 06/16/2020   Procedure: PROSTATE BIOPSY;  Surgeon: Billey Co, MD;  Location: [facility];  Service: Urology;  Laterality: N/A;   SHOULDER SURGERY     1985   TRANSRECTAL ULTRASOUND N/A 06/16/2020   Procedure: TRANSRECTAL ULTRASOUND;  Surgeon: Billey Co, MD;  Location: [facility] OR[facility];  Service: Urology;  Laterality: N/A;    SOCIAL HISTORY: Social History   Socioeconomic History   Marital status: Married    Spouse name: Not on file   Number of children: Not on file   Years of education: Not on file   Highest education level: Not on file  Occupational History   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  Tobacco Use   Smoking status: Every Day    Packs/day: 0.75    Pack years: 0.00    Types: Cigarettes   Smokeless tobacco: Never  Substance and Sexual Activity   Alcohol use: Not Currently   Drug use: Never   Sexual activity: Not Currently  Other Topics Concern   Not on file  Social History Narrative   Not on file   Social Determinants of Health   Financial Resource Strain: Not on file  Food Insecurity: Not on file  Transportation Needs: Not on file  Physical Activity: Not on file  Stress: Not on file  Social Connections: Not on file  Intimate Partner Violence: Not on file    FAMILY HISTORY: Family History  Problem Relation Age of Onset   COPD Mother    Heart attack Father     ALLERGIES:  has No Known Allergies.  MEDICATIONS:  Current Outpatient Medications  Medication Sig Dispense Refill    dexamethasone (DECADRON) 4 MG tablet Take 2 tablets (8 mg total) by mouth 2 (two) times daily. Start the day before Taxotere. Then daily after chemo for 2 days. 30 tablet 1   lidocaine-prilocaine (EMLA) cream Apply to affected area once 30 g 3   meloxicam (MOBIC) 7.5 MG tablet Take 1 tablet (7.5 mg total) by mouth daily as needed for pain. 30 tablet 1   mirtazapine (REMERON) 15 MG tablet Take 1 tablet (15 mg total) by mouth at bedtime. 30 tablet 2   Multiple Vitamins-Minerals (MULTIVITAMIN WITH MINERALS) tablet Take 1 tablet by mouth daily.     nicotine (NICODERM CQ - DOSED IN MG/24 HOURS) 21 mg/24hr patch Place 1 patch (21 mg total) onto the skin daily. 28 patch 0   ondansetron (ZOFRAN) 8 MG tablet Take 1 tablet (8 mg total) by mouth 2 (two) times daily as needed for refractory nausea / vomiting. 30 tablet 1   oxyCODONE (OXY IR/ROXICODONE) 5 MG immediate release tablet Take 1-2 tablets (5-10 mg total) by mouth every 4 (four) hours as needed for severe pain. 60 tablet 0   oxyCODONE (OXYCONTIN) 10 mg 12 hr tablet Take 1 tablet (10 mg total) by mouth  every 12 (twelve) hours. 60 tablet 0   prochlorperazine (COMPAZINE) 10 MG tablet Take 1 tablet (10 mg total) by mouth every 6 (six) hours as needed (Nausea or vomiting). 30 tablet 1   acetaminophen (TYLENOL) 325 MG tablet Take 650 mg by mouth every 6 (six) hours as needed for moderate pain or mild pain. (Patient not taking: No sig reported)     Calcium 600-400 MG-UNIT CHEW Chew 2 tablets by mouth daily. 60 tablet 3   omeprazole (PRILOSEC) 20 MG capsule Take 1 capsule (20 mg total) by mouth daily. 30 capsule 1   No current facility-administered medications for this visit.     PHYSICAL EXAMINATION: ECOG PERFORMANCE STATUS: 1 - Symptomatic but completely ambulatory Vitals:   08/28/20 0844  BP: 110/68  Pulse: 68  Resp: 20  Temp: 98 F (36.7 C)  SpO2: 100%   Filed Weights   08/28/20 0844  Weight: 169 lb 6.4 oz (76.8 kg)    Physical  Exam Constitutional:      General: He is not in acute distress. HENT:     Head: Normocephalic and atraumatic.  Eyes:     General: No scleral icterus. Cardiovascular:     Rate and Rhythm: Normal rate and regular rhythm.     Heart sounds: Normal heart sounds.  Pulmonary:     Effort: Pulmonary effort is normal. No respiratory distress.     Breath sounds: No wheezing.  Abdominal:     General: Bowel sounds are normal. There is no distension.     Palpations: Abdomen is soft.  Musculoskeletal:        General: No deformity. Normal range of motion.     Cervical back: Normal range of motion and neck supple.  Skin:    General: Skin is warm and dry.     Findings: No erythema or rash.  Neurological:     Mental Status: He is alert and oriented to person, place, and time. Mental status is at baseline.     Cranial Nerves: No cranial nerve deficit.     Coordination: Coordination normal.  Psychiatric:        Mood and Affect: Mood normal.    LABORATORY DATA:  I have reviewed the data as listed Lab Results  Component Value Date   WBC 6.9 08/28/2020   HGB 9.0 (L) 08/28/2020   HCT 27.5 (L) 08/28/2020   MCV 91.1 08/28/2020   PLT 194 08/28/2020   Recent Labs    07/24/20 0833 08/07/20 0837 08/28/20 0833  NA 137 137 138  K 4.0 4.0 4.0  CL 104 105 106  CO2 22 21* 24  GLUCOSE 130* 153* 135*  BUN 15 21 17   CREATININE 0.79 0.63 0.62  CALCIUM 8.4* 8.9 8.3*  GFRNONAA >60 >60 >60  PROT 6.1* 6.5 5.4*  ALBUMIN 3.4* 3.5 2.8*  AST 31 33 24  ALT 40 46* 32  ALKPHOS 293* 161* 86  BILITOT 0.4 0.2* 0.3    Iron/TIBC/Ferritin/ %Sat    Component Value Date/Time   IRON 121 07/17/2020 1000   TIBC 384 07/17/2020 1000   FERRITIN 299 07/17/2020 1000   IRONPCTSAT 32 07/17/2020 1000      RADIOGRAPHIC STUDIES: I have personally reviewed the radiological images as listed and agreed with the findings in the report. No results found.     ASSESSMENT & PLAN:  1. Androgen deprivation therapy    2. Encounter for antineoplastic chemotherapy   3. Neoplasm related pain   4. Prostate cancer ([place])  5. Port-A-Cath in place   Cancer Staging Prostate cancer [facility]) Staging form: Prostate, AJCC 8th Edition - Clinical stage from 06/23/2020: Stage IVB (cT2c, cNX, cM1b, PSA: 678, Grade Group: 5) - Signed by Earlie Server, MD on 06/23/2020   #Metastatic prostate cancer On  androgen deprivation therapy.--PSA has decreased to 0.12.   Eligard 45mg  on 08/15/20 Labs are reviewed and discussed with patient. Proceed with cycle 3 Docetaxel.  G-CSF support on Day 3.   #Anemia, hemoglobin has dropped to 9.0.  chemotherapy induced.  Adequate iron level, folate and vitamin B12.  #Extensive bone metastasis, neoplasm related pain. Continue OxyContin and PRN Oxycodone 5 mg  S/p radiation to hip. Currently just take Oxycontin  Supportive care measures are necessary for patient well-being and will be provided as necessary. We spent sufficient time to discuss many aspect of care, questions were answered to patient's satisfaction.   All questions were answered. The patient knows to call the clinic with any problems questions or concerns.  Return of visit: 3 weeks  Earlie Server, MD, PhD Hematology Oncology [facility] Pager- [phone] 08/28/2020

## 2020-08-28 NOTE — Progress Notes (Signed)
Patient stated he had diarrhea for a week but has gotten better. Appetite was really poor during the week of diarrhea but patient states appetite has increased within the last two days.

## 2020-08-29 ENCOUNTER — Inpatient Hospital Stay: Payer: Self-pay

## 2020-08-30 ENCOUNTER — Inpatient Hospital Stay: Payer: Self-pay

## 2020-08-30 DIAGNOSIS — C61 Malignant neoplasm of prostate: Secondary | ICD-10-CM

## 2020-08-30 MED ORDER — PEGFILGRASTIM-JMDB 6 MG/0.6ML ~~LOC~~ SOSY
6.0000 mg | PREFILLED_SYRINGE | Freq: Once | SUBCUTANEOUS | Status: AC
  Administered 2020-08-30: 6 mg via SUBCUTANEOUS
  Filled 2020-08-30: qty 0.6

## 2020-09-18 ENCOUNTER — Inpatient Hospital Stay: Payer: Self-pay

## 2020-09-18 ENCOUNTER — Other Ambulatory Visit: Payer: Self-pay

## 2020-09-18 ENCOUNTER — Ambulatory Visit
Admission: RE | Admit: 2020-09-18 | Discharge: 2020-09-18 | Disposition: A | Discharge status: Home / Self Care | Payer: Self-pay | Source: Ambulatory Visit | Attending: Radiation Oncology | Admitting: Radiation Oncology

## 2020-09-18 ENCOUNTER — Inpatient Hospital Stay (HOSPITAL_BASED_OUTPATIENT_CLINIC_OR_DEPARTMENT_OTHER): Payer: Self-pay | Admitting: Oncology

## 2020-09-18 ENCOUNTER — Inpatient Hospital Stay: Payer: Self-pay | Attending: Oncology

## 2020-09-18 ENCOUNTER — Encounter: Payer: Self-pay | Admitting: Oncology

## 2020-09-18 VITALS — BP 154/76 | HR 92 | Temp 97.9°F | Resp 18 | Wt 171.9 lb

## 2020-09-18 DIAGNOSIS — Z5111 Encounter for antineoplastic chemotherapy: Secondary | ICD-10-CM | POA: Insufficient documentation

## 2020-09-18 DIAGNOSIS — Z923 Personal history of irradiation: Secondary | ICD-10-CM | POA: Insufficient documentation

## 2020-09-18 DIAGNOSIS — C7951 Secondary malignant neoplasm of bone: Secondary | ICD-10-CM | POA: Insufficient documentation

## 2020-09-18 DIAGNOSIS — C61 Malignant neoplasm of prostate: Secondary | ICD-10-CM | POA: Insufficient documentation

## 2020-09-18 DIAGNOSIS — G893 Neoplasm related pain (acute) (chronic): Secondary | ICD-10-CM | POA: Insufficient documentation

## 2020-09-18 DIAGNOSIS — Z79899 Other long term (current) drug therapy: Secondary | ICD-10-CM | POA: Insufficient documentation

## 2020-09-18 DIAGNOSIS — D6481 Anemia due to antineoplastic chemotherapy: Secondary | ICD-10-CM | POA: Insufficient documentation

## 2020-09-18 DIAGNOSIS — Z5189 Encounter for other specified aftercare: Secondary | ICD-10-CM | POA: Insufficient documentation

## 2020-09-18 DIAGNOSIS — T451X5A Adverse effect of antineoplastic and immunosuppressive drugs, initial encounter: Secondary | ICD-10-CM | POA: Insufficient documentation

## 2020-09-18 DIAGNOSIS — F17210 Nicotine dependence, cigarettes, uncomplicated: Secondary | ICD-10-CM | POA: Insufficient documentation

## 2020-09-18 LAB — CBC WITH DIFFERENTIAL/PLATELET
Abs Immature Granulocytes: 0.06 10*3/uL (ref 0.00–0.07)
Basophils Absolute: 0 10*3/uL (ref 0.0–0.1)
Basophils Relative: 0 %
Eosinophils Absolute: 0.2 10*3/uL (ref 0.0–0.5)
Eosinophils Relative: 3 %
HCT: 30.6 % — ABNORMAL LOW (ref 39.0–52.0)
Hemoglobin: 9.6 g/dL — ABNORMAL LOW (ref 13.0–17.0)
Immature Granulocytes: 1 %
Lymphocytes Relative: 8 %
Lymphs Abs: 0.5 10*3/uL — ABNORMAL LOW (ref 0.7–4.0)
MCH: 29 pg (ref 26.0–34.0)
MCHC: 31.4 g/dL (ref 30.0–36.0)
MCV: 92.4 fL (ref 80.0–100.0)
Monocytes Absolute: 0.6 10*3/uL (ref 0.1–1.0)
Monocytes Relative: 9 %
Neutro Abs: 5.4 10*3/uL (ref 1.7–7.7)
Neutrophils Relative %: 79 %
Platelets: 412 10*3/uL — ABNORMAL HIGH (ref 150–400)
RBC: 3.31 MIL/uL — ABNORMAL LOW (ref 4.22–5.81)
RDW: 18.9 % — ABNORMAL HIGH (ref 11.5–15.5)
WBC: 6.7 10*3/uL (ref 4.0–10.5)
nRBC: 0 % (ref 0.0–0.2)

## 2020-09-18 LAB — COMPREHENSIVE METABOLIC PANEL
ALT: 33 U/L (ref 0–44)
AST: 26 U/L (ref 15–41)
Albumin: 3.2 g/dL — ABNORMAL LOW (ref 3.5–5.0)
Alkaline Phosphatase: 88 U/L (ref 38–126)
Anion gap: 8 (ref 5–15)
BUN: 16 mg/dL (ref 8–23)
CO2: 22 mmol/L (ref 22–32)
Calcium: 8.7 mg/dL — ABNORMAL LOW (ref 8.9–10.3)
Chloride: 108 mmol/L (ref 98–111)
Creatinine, Ser: 0.63 mg/dL (ref 0.61–1.24)
GFR, Estimated: >60 mL/min (ref >60)
Glucose, Bld: 198 mg/dL — ABNORMAL HIGH (ref 70–99)
Potassium: 3.9 mmol/L (ref 3.5–5.1)
Sodium: 138 mmol/L (ref 135–145)
Total Bilirubin: 0.3 mg/dL (ref 0.3–1.2)
Total Protein: 6.3 g/dL — ABNORMAL LOW (ref 6.5–8.1)

## 2020-09-18 LAB — PSA: Prostatic Specific Antigen: 0.06 ng/mL (ref 0.00–4.00)

## 2020-09-18 MED ORDER — HEPARIN SOD (PORK) LOCK FLUSH 100 UNIT/ML IV SOLN
INTRAVENOUS | Status: AC
  Filled 2020-09-18: qty 5

## 2020-09-18 MED ORDER — SODIUM CHLORIDE 0.9 % IV SOLN
75.0000 mg/m2 | Freq: Once | INTRAVENOUS | Status: AC
  Administered 2020-09-18: 150 mg via INTRAVENOUS
  Filled 2020-09-18: qty 15

## 2020-09-18 MED ORDER — OXYCODONE HCL ER 10 MG PO T12A: 10.0000 mg | EXTENDED_RELEASE_TABLET | Freq: Two times a day (BID) | ORAL | 0 refills | Status: DC

## 2020-09-18 MED ORDER — HEPARIN SOD (PORK) LOCK FLUSH 100 UNIT/ML IV SOLN
500.0000 [IU] | Freq: Once | INTRAVENOUS | Status: AC | PRN
  Administered 2020-09-18: 500 [IU]
  Filled 2020-09-18: qty 5

## 2020-09-18 MED ORDER — SODIUM CHLORIDE 0.9 % IV SOLN
Freq: Once | INTRAVENOUS | Status: AC
  Filled 2020-09-18: qty 250

## 2020-09-18 MED ORDER — SODIUM CHLORIDE 0.9 % IV SOLN
10.0000 mg | Freq: Once | INTRAVENOUS | Status: AC
  Administered 2020-09-18: 10 mg via INTRAVENOUS
  Filled 2020-09-18: qty 10

## 2020-09-18 MED ORDER — MELOXICAM 7.5 MG PO TABS: 7.5000 mg | ORAL_TABLET | Freq: Every day | ORAL | 1 refills | Status: DC | PRN

## 2020-09-18 NOTE — Patient Instructions (Signed)
[facility] ONCOLOGY  Discharge Instructions: Thank you for choosing [facility] to provide your oncology and hematology care.  If you have a lab appointment with the [facility], please go directly to the [facility] and check in at the registration area.  Wear comfortable clothing and clothing appropriate for easy access to any Portacath or PICC line.   We strive to give you quality time with your provider. You may need to reschedule your appointment if you arrive late (15 or more minutes).  Arriving late affects you and other patients whose appointments are after yours.  Also, if you miss three or more appointments without notifying the office, you may be dismissed from the clinic at the provider's discretion.      For prescription refill requests, have your pharmacy contact our office and allow 72 hours for refills to be completed.    Today you received the following chemotherapy and/or immunotherapy agents Taxotere       To help prevent nausea and vomiting after your treatment, we encourage you to take your nausea medication as directed.  BELOW ARE SYMPTOMS THAT SHOULD BE REPORTED IMMEDIATELY: *FEVER GREATER THAN 100.4 F (38 C) OR HIGHER *CHILLS OR SWEATING *NAUSEA AND VOMITING THAT IS NOT CONTROLLED WITH YOUR NAUSEA MEDICATION *UNUSUAL SHORTNESS OF BREATH *UNUSUAL BRUISING OR BLEEDING *URINARY PROBLEMS (pain or burning when urinating, or frequent urination) *BOWEL PROBLEMS (unusual diarrhea, constipation, pain near the anus) TENDERNESS IN MOUTH AND THROAT WITH OR WITHOUT PRESENCE OF ULCERS (sore throat, sores in mouth, or a toothache) UNUSUAL RASH, SWELLING OR PAIN  UNUSUAL VAGINAL DISCHARGE OR ITCHING   Items with * indicate a potential emergency and should be followed up as soon as possible or go to the Emergency Department if any problems should occur.  Please show the CHEMOTHERAPY ALERT CARD or IMMUNOTHERAPY ALERT CARD at check-in  to the Emergency Department and triage nurse.  Should you have questions after your visit or need to cancel or reschedule your appointment, please contact [facility]  [phone] and follow the prompts.  Office hours are 8:00 a.m. to 4:30 p.m. Monday - Friday. Please note that voicemails left after 4:00 p.m. may not be returned until the following business day.  We are closed weekends and major holidays. You have access to a nurse at all times for urgent questions. Please call the main number to the clinic [phone] and follow the prompts.  For any non-urgent questions, you may also contact your provider using MyChart. We now offer e-Visits for anyone 7 and older to request care online for non-urgent symptoms. For details visit [email or web address].   Also download the MyChart app! Go to the app store, search "MyChart", open the app, select [facility], and log in with your MyChart username and password.  Due to Covid, a mask is required upon entering the hospital/clinic. If you do not have a mask, one will be given to you upon arrival. For doctor visits, patients may have 1 support person aged 62 or older with them. For treatment visits, patients cannot have anyone with them due to current Covid guidelines and our immunocompromised population.

## 2020-09-18 NOTE — Addendum Note (Signed)
Addended by: Earlie Server on: 09/18/2020 09:59 AM   Modules accepted: Orders

## 2020-09-18 NOTE — Progress Notes (Signed)
Radiation Oncology Follow up Note  Name: Cody Vaughan   Date:   09/18/2020 MRN:  [id number] DOB: 12/03/1960    This 60 y.o. male presents to the clinic today for 1 month follow-up status post palliative radiation therapy.  To his lower lumbar spine and sacrum for stage IV adenocarcinoma the prostate  REFERRING PROVIDER: No ref. provider found  HPI: Patient is a 60 year old male now out 1 month having completed palliative radiation therapy to his lower lumbar spine and sacrum for metastatic involvement of prostate cancer seen today in routine follow-up he is doing well he states the pain is markedly improved he is ambulating well without significant side effect or complaint.Marland Kitchen  He currently is receiving ADT therapy as well asDocetaxel which she is tolerating well.  He continues on reduced dose of OxyContin.  He is having no other areas of bony pain.  COMPLICATIONS OF TREATMENT: none  FOLLOW UP COMPLIANCE: keeps appointments   PHYSICAL EXAM:  There were no vitals taken for this visit. Range of motion lower extremities does not elicit pain.  Motor or sensory levels are equal and symmetric in lower extremities.  Deep palpation of his lower back and sacrum areas does not elicit pain.  Well-developed well-nourished patient in NAD. HEENT reveals PERLA, EOMI, discs not visualized.  Oral cavity is clear. No oral mucosal lesions are identified. Neck is clear without evidence of cervical or supraclavicular adenopathy. Lungs are clear to A&P. Cardiac examination is essentially unremarkable with regular rate and rhythm without murmur rub or thrill. Abdomen is benign with no organomegaly or masses noted. Motor sensory and DTR levels are equal and symmetric in the upper and lower extremities. Cranial nerves II through XII are grossly intact. Proprioception is intact. No peripheral adenopathy or edema is identified. No motor or sensory levels are noted. Crude visual fields are within normal range.  RADIOLOGY  RESULTS: No current films for review  PLAN: At the present time I am going to turn over care to medical oncology.  I be happy to reevaluate the patient anytime should further palliative treatment be indicated.  Patient knows to call with any concerns.  I would like to take this opportunity to thank you for allowing me to participate in the care of your patient.Noreene Filbert, MD

## 2020-09-18 NOTE — Progress Notes (Signed)
Hematology/Oncology progress note [facility] Telephone:([phone] [phone]   Patient Care Team: Patient, No Pcp Per (Inactive) as PCP - General (General Practice)  REFERRING PROVIDER: No ref. provider found  CHIEF COMPLAINTS/REASON FOR VISIT:  Follow-up for prostate cancer treatment  HISTORY OF PRESENTING ILLNESS:   Cody Vaughan is a  60 y.o.  male with PMH listed below was seen in consultation at the request of  No ref. provider found  for evaluation of bone lesions 05/27/2020 patient presented to emergency room for evaluation of generalized weakness and and body pain.  Prior to the presentations, patient also had history of tooth abscess and was prescribed antibiotics. Reports acute on chronic right-sided lumbar pain without any prior trauma history.  Profound weakness.  Decreased appetite and loss of weight about 10 pounds during the past few months.  Also complained intermittent right upper quadrant discomfort  326 04/10/2020 CT abdomen pelvis showed a diffusely patchy sclerotic appearance of the osseous structures suspicious for osseous metastatic disease.  Prostate gland is heterogeneous in appearance.  Correlate with PSA.  Patient had a mild superior endplate compression deformity of L4 with less than 10% vertebral body height loss.  H indeterminate.  Fusiform infrarenal abdominal aortic aneurysm measuring up to 3.5 cm.  Follow-up in 2 years. Suspicious findings for early acute uncomplicated appendicitis.  Patient denies any right lower quadrant pain.  In the emergency room, he has a negative Murphy sign.  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  Highest Gleason score 5+4 Postprocedure, patient has had hematuria and hematochezia, bilateral lower extremity pain, AKI orthostatic hypovolemia, sepsis and was admitted and treated.  Patient was discharged on a course of  antibiotics.  06/18/2020 MRI thoracic, lumbar, sacrum is compatible with diffuse osseous metastasis in the visualized thoracic lumbar spine, sacrum and bilateral iliac bones.  No definitive dural based tumor is identified.  No compression fracture.  Spine spondylosis  06/20/2019, patient received loading dose of Firmagon 240 mg x 1. #06/22/2020, bone scan showed widespread osseous metastatic disease involving the appendicular and axial skeleton #April 2022. COVID infection. #07/10/2020, Dr. Lucky Cowboy placed Mediport #07/17/2020, Start  docetaxel.  Firmagon x1  #INTERVAL HISTORY Cody Vaughan is a 60 y.o. male who has above history reviewed by me today presents for follow up visit for management of metastatic prostate cancer Problems and complaints are listed below Patient tolerates chemotherapy well. Bone pain after G CSF injection for 2 days. Takes claritin. Mild diarrhea for a few days after last chemo, resolved spontaneously.   He takes 1 OxyContin in the morning and no need for short acting oxycodone on most days.  He takes Mobic on some of the days.   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   Review of Systems  Constitutional:  Positive for fatigue. Negative for appetite change, chills, fever and unexpected weight change.       Patient walks independently  HENT:   Negative for hearing loss and voice change.   Eyes:  Negative for eye problems and icterus.  Respiratory:  Negative for chest tightness, cough and shortness of breath.   Cardiovascular:  Negative for chest pain and leg swelling.  Gastrointestinal:  Negative for abdominal distention and abdominal pain.  Endocrine: Positive for hot flashes.  Genitourinary:  Negative for difficulty urinating, dysuria and frequency.   Musculoskeletal:  Negative for arthralgias and back pain.  Hip pain  Skin:  Negative for itching and rash.  Neurological:  Negative for light-headedness and numbness.  Hematological:  Negative for  adenopathy. Does not bruise/bleed easily.  Psychiatric/Behavioral:  Negative for confusion.    MEDICAL HISTORY:  Past Medical History:  Diagnosis Date   Prostate cancer ([place])    2022    SURGICAL HISTORY: Past Surgical History:  Procedure Laterality Date   PORTA CATH INSERTION N/A 07/10/2020   Procedure: PORTA CATH INSERTION;  Surgeon: Algernon Huxley, MD;  Location: [place] [facility];  Service: Cardiovascular;  Laterality: N/A;   PROSTATE BIOPSY N/A 06/16/2020   Procedure: PROSTATE BIOPSY;  Surgeon: Billey Co, MD;  Location: [facility];  Service: Urology;  Laterality: N/A;   SHOULDER SURGERY     1985   TRANSRECTAL ULTRASOUND N/A 06/16/2020   Procedure: TRANSRECTAL ULTRASOUND;  Surgeon: Billey Co, MD;  Location: [facility];  Service: Urology;  Laterality: N/A;    SOCIAL HISTORY: Social History   Socioeconomic History   Marital status: Married    Spouse name: Not on file   Number of children: Not on file   Years of education: Not on file   Highest education level: Not on file  Occupational History   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  Tobacco Use   Smoking status: Every Day    Packs/day: 0.75    Types: Cigarettes   Smokeless tobacco: Never  Substance and Sexual Activity   Alcohol use: Not Currently   Drug use: Never   Sexual activity: Not Currently  Other Topics Concern   Not on file  Social History Narrative   Not on file   Social Determinants of Health   Financial Resource Strain: Not on file  Food Insecurity: Not on file  Transportation Needs: Not on file  Physical Activity: Not on file  Stress: Not on file  Social Connections: Not on file  Intimate Partner Violence: Not on file    FAMILY HISTORY: Family History  Problem Relation Age of Onset   COPD Mother    Heart attack Father     ALLERGIES:  has No Known Allergies.  MEDICATIONS:  Current Outpatient Medications  Medication Sig Dispense Refill   Calcium  600-400 MG-UNIT CHEW Chew 2 tablets by mouth daily. 60 tablet 3   dexamethasone (DECADRON) 4 MG tablet Take 2 tablets (8 mg total) by mouth 2 (two) times daily. Start the day before Taxotere. Then daily after chemo for 2 days. 30 tablet 1   lidocaine-prilocaine (EMLA) cream Apply to affected area once 30 g 3   meloxicam (MOBIC) 7.5 MG tablet Take 1 tablet (7.5 mg total) by mouth daily as needed for pain. 30 tablet 1   mirtazapine (REMERON) 15 MG tablet Take 1 tablet (15 mg total) by mouth at bedtime. 30 tablet 2   Multiple Vitamins-Minerals (MULTIVITAMIN WITH MINERALS) tablet Take 1 tablet by mouth daily.     omeprazole (PRILOSEC) 20 MG capsule Take 1 capsule (20 mg total) by mouth daily. 30 capsule 1   ondansetron (ZOFRAN) 8 MG tablet Take 1 tablet (8 mg total) by mouth 2 (two) times daily as needed for refractory nausea / vomiting. 30 tablet 1   oxyCODONE (OXY IR/ROXICODONE) 5 MG immediate release tablet Take 1-2 tablets (5-10 mg total) by mouth every 4 (four) hours as needed for severe pain. 60 tablet 0   oxyCODONE (OXYCONTIN) 10 mg 12 hr tablet Take 1 tablet (10 mg total)  by mouth every 12 (twelve) hours. 60 tablet 0   prochlorperazine (COMPAZINE) 10 MG tablet Take 1 tablet (10 mg total) by mouth every 6 (six) hours as needed (Nausea or vomiting). 30 tablet 1   acetaminophen (TYLENOL) 325 MG tablet Take 650 mg by mouth every 6 (six) hours as needed for moderate pain or mild pain. (Patient not taking: No sig reported)     nicotine (NICODERM CQ - DOSED IN MG/24 HOURS) 21 mg/24hr patch Place 1 patch (21 mg total) onto the skin daily. (Patient not taking: Reported on 09/18/2020) 28 patch 0   No current facility-administered medications for this visit.     PHYSICAL EXAMINATION: ECOG PERFORMANCE STATUS: 1 - Symptomatic but completely ambulatory Vitals:   09/18/20 0926  BP: (!) 154/76  Pulse: 92  Resp: 18  Temp: 97.9 F (36.6 C)   Filed Weights   09/18/20 0926  Weight: 171 lb 14.4 oz (78  kg)    Physical Exam Constitutional:      General: He is not in acute distress. HENT:     Head: Normocephalic and atraumatic.  Eyes:     General: No scleral icterus. Cardiovascular:     Rate and Rhythm: Normal rate and regular rhythm.     Heart sounds: Normal heart sounds.  Pulmonary:     Effort: Pulmonary effort is normal. No respiratory distress.     Breath sounds: No wheezing.  Abdominal:     General: Bowel sounds are normal. There is no distension.     Palpations: Abdomen is soft.  Musculoskeletal:        General: No deformity. Normal range of motion.     Cervical back: Normal range of motion and neck supple.  Skin:    General: Skin is warm and dry.     Findings: No erythema or rash.  Neurological:     Mental Status: He is alert and oriented to person, place, and time. Mental status is at baseline.     Cranial Nerves: No cranial nerve deficit.     Coordination: Coordination normal.  Psychiatric:        Mood and Affect: Mood normal.    LABORATORY DATA:  I have reviewed the data as listed Lab Results  Component Value Date   WBC 6.9 08/28/2020   HGB 9.0 (L) 08/28/2020   HCT 27.5 (L) 08/28/2020   MCV 91.1 08/28/2020   PLT 194 08/28/2020   Recent Labs    07/24/20 0833 08/07/20 0837 08/28/20 0833  NA 137 137 138  K 4.0 4.0 4.0  CL 104 105 106  CO2 22 21* 24  GLUCOSE 130* 153* 135*  BUN 15 21 17   CREATININE 0.79 0.63 0.62  CALCIUM 8.4* 8.9 8.3*  GFRNONAA >60 >60 >60  PROT 6.1* 6.5 5.4*  ALBUMIN 3.4* 3.5 2.8*  AST 31 33 24  ALT 40 46* 32  ALKPHOS 293* 161* 86  BILITOT 0.4 0.2* 0.3    Iron/TIBC/Ferritin/ %Sat    Component Value Date/Time   IRON 121 07/17/2020 1000   TIBC 384 07/17/2020 1000   FERRITIN 299 07/17/2020 1000   IRONPCTSAT 32 07/17/2020 1000      RADIOGRAPHIC STUDIES: I have personally reviewed the radiological images as listed and agreed with the findings in the report. No results found.     ASSESSMENT & PLAN:  1. Encounter for  antineoplastic chemotherapy   2. Prostate cancer ([place])   3. Neoplasm related pain   Cancer Staging Prostate cancer [facility]) Staging form:  Prostate, AJCC 8th Edition - Clinical stage from 06/23/2020: Stage IVB (cT2c, cNX, cM1b, PSA: 678, Grade Group: 5) - Signed by Earlie Server, MD on 06/23/2020   #Metastatic prostate cancer On  androgen deprivation therapy.--08/28/20 PSA has decreased to 0.12.   Eligard 45mg  on 08/15/20 Labs are reviewed and discussed with patient. Proceed with cycle 4 Docetaxel.  G-CSF support on Day 3.   #Anemia, hemoglobin has dropped to 9.0.  chemotherapy induced.  Adequate iron level, folate and vitamin B12.  #Extensive bone metastasis, neoplasm related pain. He was on  OxyContin and PRN Oxycodone 5 mg  S/p radiation to hip. Currently just take Oxycontin 1-2 times per day, refilled 60 tablets today.  Mobic PRN, refilled.   Supportive care measures are necessary for patient well-being and will be provided as necessary. We spent sufficient time to discuss many aspect of care, questions were answered to patient's satisfaction.   All questions were answered. The patient knows to call the clinic with any problems questions or concerns.  Return of visit: 3 weeks lab MD Docetaxel + Day 3 Fulphilia.   Earlie Server, MD, PhD Hematology Oncology [facility] at [facility] Pager- [phone] 09/18/2020

## 2020-09-18 NOTE — Progress Notes (Signed)
Patient denies new problems/concerns today.   °

## 2020-09-20 ENCOUNTER — Inpatient Hospital Stay: Payer: Self-pay

## 2020-09-20 ENCOUNTER — Other Ambulatory Visit: Payer: Self-pay

## 2020-09-20 DIAGNOSIS — C61 Malignant neoplasm of prostate: Secondary | ICD-10-CM

## 2020-09-20 MED ORDER — PEGFILGRASTIM-JMDB 6 MG/0.6ML ~~LOC~~ SOSY
6.0000 mg | PREFILLED_SYRINGE | Freq: Once | SUBCUTANEOUS | Status: AC
  Administered 2020-09-20: 6 mg via SUBCUTANEOUS
  Filled 2020-09-20: qty 0.6

## 2020-10-09 ENCOUNTER — Inpatient Hospital Stay: Payer: Self-pay | Attending: Oncology

## 2020-10-09 ENCOUNTER — Inpatient Hospital Stay: Payer: Self-pay

## 2020-10-09 ENCOUNTER — Inpatient Hospital Stay (HOSPITAL_BASED_OUTPATIENT_CLINIC_OR_DEPARTMENT_OTHER): Payer: Self-pay | Admitting: Oncology

## 2020-10-09 ENCOUNTER — Encounter: Payer: Self-pay | Admitting: Oncology

## 2020-10-09 VITALS — BP 128/75 | HR 70 | Temp 96.7°F | Resp 18 | Wt 174.0 lb

## 2020-10-09 VITALS — BP 154/90 | HR 81 | Resp 16

## 2020-10-09 DIAGNOSIS — R739 Hyperglycemia, unspecified: Secondary | ICD-10-CM

## 2020-10-09 DIAGNOSIS — Z5111 Encounter for antineoplastic chemotherapy: Secondary | ICD-10-CM | POA: Insufficient documentation

## 2020-10-09 DIAGNOSIS — G893 Neoplasm related pain (acute) (chronic): Secondary | ICD-10-CM | POA: Insufficient documentation

## 2020-10-09 DIAGNOSIS — Z79899 Other long term (current) drug therapy: Secondary | ICD-10-CM | POA: Insufficient documentation

## 2020-10-09 DIAGNOSIS — Z923 Personal history of irradiation: Secondary | ICD-10-CM | POA: Insufficient documentation

## 2020-10-09 DIAGNOSIS — C61 Malignant neoplasm of prostate: Secondary | ICD-10-CM

## 2020-10-09 DIAGNOSIS — Z79818 Long term (current) use of other agents affecting estrogen receptors and estrogen levels: Secondary | ICD-10-CM

## 2020-10-09 DIAGNOSIS — D6481 Anemia due to antineoplastic chemotherapy: Secondary | ICD-10-CM | POA: Insufficient documentation

## 2020-10-09 DIAGNOSIS — F17210 Nicotine dependence, cigarettes, uncomplicated: Secondary | ICD-10-CM | POA: Insufficient documentation

## 2020-10-09 DIAGNOSIS — C7951 Secondary malignant neoplasm of bone: Secondary | ICD-10-CM | POA: Insufficient documentation

## 2020-10-09 DIAGNOSIS — T451X5A Adverse effect of antineoplastic and immunosuppressive drugs, initial encounter: Secondary | ICD-10-CM | POA: Insufficient documentation

## 2020-10-09 DIAGNOSIS — Z5189 Encounter for other specified aftercare: Secondary | ICD-10-CM | POA: Insufficient documentation

## 2020-10-09 LAB — CBC WITH DIFFERENTIAL/PLATELET
Abs Immature Granulocytes: 0.02 10*3/uL (ref 0.00–0.07)
Basophils Absolute: 0 10*3/uL (ref 0.0–0.1)
Basophils Relative: 0 %
Eosinophils Absolute: 0 10*3/uL (ref 0.0–0.5)
Eosinophils Relative: 0 %
HCT: 28.1 % — ABNORMAL LOW (ref 39.0–52.0)
Hemoglobin: 9 g/dL — ABNORMAL LOW (ref 13.0–17.0)
Immature Granulocytes: 0 %
Lymphocytes Relative: 14 %
Lymphs Abs: 1 10*3/uL (ref 0.7–4.0)
MCH: 29.5 pg (ref 26.0–34.0)
MCHC: 32 g/dL (ref 30.0–36.0)
MCV: 92.1 fL (ref 80.0–100.0)
Monocytes Absolute: 0.7 10*3/uL (ref 0.1–1.0)
Monocytes Relative: 11 %
Neutro Abs: 5 10*3/uL (ref 1.7–7.7)
Neutrophils Relative %: 75 %
Platelets: 276 10*3/uL (ref 150–400)
RBC: 3.05 MIL/uL — ABNORMAL LOW (ref 4.22–5.81)
RDW: 18.5 % — ABNORMAL HIGH (ref 11.5–15.5)
WBC: 6.7 10*3/uL (ref 4.0–10.5)
nRBC: 0 % (ref 0.0–0.2)

## 2020-10-09 LAB — COMPREHENSIVE METABOLIC PANEL
ALT: 34 U/L (ref 0–44)
AST: 27 U/L (ref 15–41)
Albumin: 3.4 g/dL — ABNORMAL LOW (ref 3.5–5.0)
Alkaline Phosphatase: 79 U/L (ref 38–126)
Anion gap: 8 (ref 5–15)
BUN: 19 mg/dL (ref 8–23)
CO2: 24 mmol/L (ref 22–32)
Calcium: 8.7 mg/dL — ABNORMAL LOW (ref 8.9–10.3)
Chloride: 104 mmol/L (ref 98–111)
Creatinine, Ser: 0.72 mg/dL (ref 0.61–1.24)
GFR, Estimated: >60 mL/min (ref >60)
Glucose, Bld: 198 mg/dL — ABNORMAL HIGH (ref 70–99)
Potassium: 3.8 mmol/L (ref 3.5–5.1)
Sodium: 136 mmol/L (ref 135–145)
Total Bilirubin: 0.3 mg/dL (ref 0.3–1.2)
Total Protein: 6.3 g/dL — ABNORMAL LOW (ref 6.5–8.1)

## 2020-10-09 LAB — PSA: Prostatic Specific Antigen: 0.01 ng/mL (ref 0.00–4.00)

## 2020-10-09 MED ORDER — SODIUM CHLORIDE 0.9% FLUSH
10.0000 mL | Freq: Once | INTRAVENOUS | Status: AC
  Administered 2020-10-09: 10 mL via INTRAVENOUS
  Filled 2020-10-09: qty 10

## 2020-10-09 MED ORDER — HEPARIN SOD (PORK) LOCK FLUSH 100 UNIT/ML IV SOLN
500.0000 [IU] | Freq: Once | INTRAVENOUS | Status: AC | PRN
  Administered 2020-10-09: 500 [IU]
  Filled 2020-10-09: qty 5

## 2020-10-09 MED ORDER — SODIUM CHLORIDE 0.9 % IV SOLN
10.0000 mg | Freq: Once | INTRAVENOUS | Status: AC
  Administered 2020-10-09: 10 mg via INTRAVENOUS
  Filled 2020-10-09: qty 10

## 2020-10-09 MED ORDER — SODIUM CHLORIDE 0.9 % IV SOLN
75.0000 mg/m2 | Freq: Once | INTRAVENOUS | Status: AC
  Administered 2020-10-09: 150 mg via INTRAVENOUS
  Filled 2020-10-09: qty 15

## 2020-10-09 MED ORDER — HEPARIN SOD (PORK) LOCK FLUSH 100 UNIT/ML IV SOLN
INTRAVENOUS | Status: AC
  Filled 2020-10-09: qty 5

## 2020-10-09 MED ORDER — SODIUM CHLORIDE 0.9 % IV SOLN
Freq: Once | INTRAVENOUS | Status: AC
  Filled 2020-10-09: qty 250

## 2020-10-09 NOTE — Progress Notes (Signed)
Nutrition Follow-up:   Patient with metastatic prostate cancer.  Patient receiving docetaxel.    Met with patient during infusion. Patient reports that his appetite is usually good.  Appetite does decrease for few days following treatment but then picks back up.  Patient concerned about blood glucose and said MD was concerned about it.  Maybe steroids.      Medications: reviewed  Labs: glucose 198  Anthropometrics:   Weight 174 lb today, stable  174 lb on 6/6 170 lb 4.8 oz on 5/23 164 lb on 12.8 oz on 4/22 173 lb on 3/26   NUTRITION DIAGNOSIS: Inadequate oral intake improved   INTERVENTION:  Discussed switching to sugar free beverages to help blood glucose.  Patient drinks sweet tea, regular sodas.   Continue oral nutrition supplements, likes boost shakes    MONITORING, EVALUATION, GOAL: weight trends, intake, blood glucose   NEXT VISIT: Monday, August 29th during infusion  Shallon Yaklin B. Zenia Resides, [place], [place] Registered Dietitian [phone] (mobile)

## 2020-10-09 NOTE — Progress Notes (Signed)
Hematology/Oncology progress note [facility] Telephone:([phone] [phone]   Patient Care Team: Patient, No Pcp Per (Inactive) as PCP - General (General Practice)  REFERRING PROVIDER: No ref. provider found  CHIEF COMPLAINTS/REASON FOR VISIT:  Follow-up for prostate cancer treatment  HISTORY OF PRESENTING ILLNESS:   Cody Vaughan is a  60 y.o.  male with PMH listed below was seen in consultation at the request of  No ref. provider found  for evaluation of bone lesions 05/27/2020 patient presented to emergency room for evaluation of generalized weakness and and body pain.  Prior to the presentations, patient also had history of tooth abscess and was prescribed antibiotics. Reports acute on chronic right-sided lumbar pain without any prior trauma history.  Profound weakness.  Decreased appetite and loss of weight about 10 pounds during the past few months.  Also complained intermittent right upper quadrant discomfort  326 04/10/2020 CT abdomen pelvis showed a diffusely patchy sclerotic appearance of the osseous structures suspicious for osseous metastatic disease.  Prostate gland is heterogeneous in appearance.  Correlate with PSA.  Patient had a mild superior endplate compression deformity of L4 with less than 10% vertebral body height loss.  H indeterminate.  Fusiform infrarenal abdominal aortic aneurysm measuring up to 3.5 cm.  Follow-up in 2 years. Suspicious findings for early acute uncomplicated appendicitis.  Patient denies any right lower quadrant pain.  In the emergency room, he has a negative Murphy sign.  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  Highest Gleason score 5+4 Postprocedure, patient has had hematuria and hematochezia, bilateral lower extremity pain, AKI orthostatic hypovolemia, sepsis and was admitted and treated.  Patient was discharged on a course of  antibiotics.  06/18/2020 MRI thoracic, lumbar, sacrum is compatible with diffuse osseous metastasis in the visualized thoracic lumbar spine, sacrum and bilateral iliac bones.  No definitive dural based tumor is identified.  No compression fracture.  Spine spondylosis  06/20/2019, patient received loading dose of Firmagon 240 mg x 1. #06/22/2020, bone scan showed widespread osseous metastatic disease involving the appendicular and axial skeleton #April 2022. COVID infection. #07/10/2020, Dr. Lucky Cowboy placed Mediport #07/17/2020, Start  docetaxel.  Firmagon x1  #INTERVAL HISTORY Cody Vaughan is a 60 y.o. male who has above history reviewed by me today presents for follow up visit for management of metastatic prostate cancer Problems and complaints are listed below He tolerates chemotherapy well.  No nausea vomiting, diarrhea.   He takes 1 OxyContin in the morning and no need for short acting oxycodone on most days.  Some fatigue, he works on his home improvement projects.   Review of Systems  Constitutional:  Positive for fatigue. Negative for appetite change, chills, fever and unexpected weight change.       Patient walks independently  HENT:   Negative for hearing loss and voice change.   Eyes:  Negative for eye problems and icterus.  Respiratory:  Negative for chest tightness, cough and shortness of breath.   Cardiovascular:  Negative for chest pain and leg swelling.  Gastrointestinal:  Negative for abdominal distention and abdominal pain.  Endocrine: Positive for hot flashes.  Genitourinary:  Negative for difficulty urinating, dysuria and frequency.   Musculoskeletal:  Negative for arthralgias and back pain.       Hip pain  Skin:  Negative for itching and rash.  Neurological:  Negative for light-headedness and numbness.  Hematological:  Negative for adenopathy. Does not  bruise/bleed easily.  Psychiatric/Behavioral:  Negative for confusion.    MEDICAL HISTORY:  Past Medical History:   Diagnosis Date   Prostate cancer ([place])    2022    SURGICAL HISTORY: Past Surgical History:  Procedure Laterality Date   PORTA CATH INSERTION N/A 07/10/2020   Procedure: PORTA CATH INSERTION;  Surgeon: Algernon Huxley, MD;  Location: [place] [facility];  Service: Cardiovascular;  Laterality: N/A;   PROSTATE BIOPSY N/A 06/16/2020   Procedure: PROSTATE BIOPSY;  Surgeon: Billey Co, MD;  Location: [facility];  Service: Urology;  Laterality: N/A;   SHOULDER SURGERY     1985   TRANSRECTAL ULTRASOUND N/A 06/16/2020   Procedure: TRANSRECTAL ULTRASOUND;  Surgeon: Billey Co, MD;  Location: [facility];  Service: Urology;  Laterality: N/A;    SOCIAL HISTORY: Social History   Socioeconomic History   Marital status: Married    Spouse name: Not on file   Number of children: Not on file   Years of education: Not on file   Highest education level: Not on file  Occupational History   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  Tobacco Use   Smoking status: Every Day    Packs/day: 0.75    Types: Cigarettes   Smokeless tobacco: Never  Substance and Sexual Activity   Alcohol use: Not Currently   Drug use: Never   Sexual activity: Not Currently  Other Topics Concern   Not on file  Social History Narrative   Not on file   Social Determinants of Health   Financial Resource Strain: Not on file  Food Insecurity: Not on file  Transportation Needs: Not on file  Physical Activity: Not on file  Stress: Not on file  Social Connections: Not on file  Intimate Partner Violence: Not on file    FAMILY HISTORY: Family History  Problem Relation Age of Onset   COPD Mother    Heart attack Father     ALLERGIES:  has No Known Allergies.  MEDICATIONS:  Current Outpatient Medications  Medication Sig Dispense Refill   acetaminophen (TYLENOL) 325 MG tablet Take 650 mg by mouth every 6 (six) hours as needed for moderate pain or mild pain.     Calcium 600-400  MG-UNIT CHEW Chew 2 tablets by mouth daily. 60 tablet 3   dexamethasone (DECADRON) 4 MG tablet Take 2 tablets (8 mg total) by mouth 2 (two) times daily. Start the day before Taxotere. Then daily after chemo for 2 days. 30 tablet 1   lidocaine-prilocaine (EMLA) cream Apply to affected area once 30 g 3   meloxicam (MOBIC) 7.5 MG tablet Take 1 tablet (7.5 mg total) by mouth daily as needed for pain. 60 tablet 1   mirtazapine (REMERON) 15 MG tablet Take 1 tablet (15 mg total) by mouth at bedtime. 30 tablet 2   Multiple Vitamins-Minerals (MULTIVITAMIN WITH MINERALS) tablet Take 1 tablet by mouth daily.     omeprazole (PRILOSEC) 20 MG capsule Take 1 capsule (20 mg total) by mouth daily. 30 capsule 1   oxyCODONE (OXY IR/ROXICODONE) 5 MG immediate release tablet Take 1-2 tablets (5-10 mg total) by mouth every 4 (four) hours as needed for severe pain. 60 tablet 0   oxyCODONE (OXYCONTIN) 10 mg 12 hr tablet Take 1 tablet (10 mg total) by mouth every 12 (twelve) hours. 60 tablet 0   nicotine (NICODERM CQ - DOSED IN MG/24 HOURS) 21 mg/24hr patch Place 1 patch (21 mg total) onto  the skin daily. (Patient not taking: No sig reported) 28 patch 0   ondansetron (ZOFRAN) 8 MG tablet Take 1 tablet (8 mg total) by mouth 2 (two) times daily as needed for refractory nausea / vomiting. (Patient not taking: Reported on 10/09/2020) 30 tablet 1   prochlorperazine (COMPAZINE) 10 MG tablet Take 1 tablet (10 mg total) by mouth every 6 (six) hours as needed (Nausea or vomiting). (Patient not taking: Reported on 10/09/2020) 30 tablet 1   No current facility-administered medications for this visit.     PHYSICAL EXAMINATION: ECOG PERFORMANCE STATUS: 1 - Symptomatic but completely ambulatory Vitals:   10/09/20 0854  BP: 128/75  Pulse: 70  Resp: 18  Temp: (!) 96.7 F (35.9 C)  SpO2: 99%   Filed Weights   10/09/20 0854  Weight: 174 lb (78.9 kg)    Physical Exam Constitutional:      General: He is not in acute  distress. HENT:     Head: Normocephalic and atraumatic.  Eyes:     General: No scleral icterus. Cardiovascular:     Rate and Rhythm: Normal rate and regular rhythm.     Heart sounds: Normal heart sounds.  Pulmonary:     Effort: Pulmonary effort is normal. No respiratory distress.     Breath sounds: No wheezing.  Abdominal:     General: Bowel sounds are normal. There is no distension.     Palpations: Abdomen is soft.  Musculoskeletal:        General: No deformity. Normal range of motion.     Cervical back: Normal range of motion and neck supple.  Skin:    General: Skin is warm and dry.     Findings: No erythema or rash.  Neurological:     Mental Status: He is alert and oriented to person, place, and time. Mental status is at baseline.     Cranial Nerves: No cranial nerve deficit.     Coordination: Coordination normal.  Psychiatric:        Mood and Affect: Mood normal.    LABORATORY DATA:  I have reviewed the data as listed Lab Results  Component Value Date   WBC 6.7 10/09/2020   HGB 9.0 (L) 10/09/2020   HCT 28.1 (L) 10/09/2020   MCV 92.1 10/09/2020   PLT 276 10/09/2020   Recent Labs    08/28/20 0833 09/18/20 0903 10/09/20 0822  NA 138 138 136  K 4.0 3.9 3.8  CL 106 108 104  CO2 '24 22 24  '$ GLUCOSE 135* 198* 198*  BUN '17 16 19  '$ CREATININE 0.62 0.63 0.72  CALCIUM 8.3* 8.7* 8.7*  GFRNONAA >60 >60 >60  PROT 5.4* 6.3* 6.3*  ALBUMIN 2.8* 3.2* 3.4*  AST '24 26 27  '$ ALT 32 33 34  ALKPHOS 86 88 79  BILITOT 0.3 0.3 0.3    Iron/TIBC/Ferritin/ %Sat    Component Value Date/Time   IRON 121 07/17/2020 1000   TIBC 384 07/17/2020 1000   FERRITIN 299 07/17/2020 1000   IRONPCTSAT 32 07/17/2020 1000      RADIOGRAPHIC STUDIES: I have personally reviewed the radiological images as listed and agreed with the findings in the report. No results found.     ASSESSMENT & PLAN:  1. Encounter for antineoplastic chemotherapy   2. Prostate cancer ([place])   3. Neoplasm related  pain   4. Androgen deprivation therapy   5. Bone metastasis ([place])   6. Hyperglycemia   Cancer Staging Prostate cancer [facility]) Staging form: Prostate, AJCC 8th  Edition - Clinical stage from 06/23/2020: Stage IVB (cT2c, cNX, cM1b, PSA: 678, Grade Group: 5) - Signed by Earlie Server, MD on 06/23/2020   #Metastatic prostate cancer-castration sensitive On  androgen deprivation therapy.--08/28/20 PSA has decreased to 0.01  Eligard '45mg'$  on 08/15/20 Labs are reviewed and discussed with patient. Proceed with cycle 5 Docetaxel.  G-CSF support on Day 3.   #Anemia, hemoglobin has dropped to 9.0.  chemotherapy induced.  Adequate iron level, folate and vitamin B12. Continue to monitor.   #Extensive bone metastasis, neoplasm related pain. He was on  OxyContin and PRN Oxycodone 5 mg  S/p radiation to hip. Currently just take Oxycontin 1-2 times per day, refilled 60 tablets today.    Supportive care measures are necessary for patient well-being and will be provided as necessary. We spent sufficient time to discuss many aspect of care, questions were answered to patient's satisfaction.   All questions were answered. The patient knows to call the clinic with any problems questions or concerns.  Return of visit: 3 weeks lab MD Docetaxel + Day 3 Fulphilia.   Earlie Server, MD, PhD Hematology Oncology [facility] Pager- [phone] 10/09/2020

## 2020-10-09 NOTE — Patient Instructions (Signed)
[facility]  Discharge Instructions: Thank you for choosing [facility] to provide your oncology and hematology care.  If you have a lab appointment with the [facility], please go directly to the [facility] and check in at the registration area.  Wear comfortable clothing and clothing appropriate for easy access to any Portacath or PICC line.   We strive to give you quality time with your provider. You may need to reschedule your appointment if you arrive late (15 or more minutes).  Arriving late affects you and other patients whose appointments are after yours.  Also, if you miss three or more appointments without notifying the office, you may be dismissed from the clinic at the provider's discretion.      For prescription refill requests, have your pharmacy contact our office and allow 72 hours for refills to be completed.    Today you received the following chemotherapy and/or immunotherapy agents Docetaxel      To help prevent nausea and vomiting after your treatment, we encourage you to take your nausea medication as directed.  BELOW ARE SYMPTOMS THAT SHOULD BE REPORTED IMMEDIATELY: *FEVER GREATER THAN 100.4 F (38 C) OR HIGHER *CHILLS OR SWEATING *NAUSEA AND VOMITING THAT IS NOT CONTROLLED WITH YOUR NAUSEA MEDICATION *UNUSUAL SHORTNESS OF BREATH *UNUSUAL BRUISING OR BLEEDING *URINARY PROBLEMS (pain or burning when urinating, or frequent urination) *BOWEL PROBLEMS (unusual diarrhea, constipation, pain near the anus) TENDERNESS IN MOUTH AND THROAT WITH OR WITHOUT PRESENCE OF ULCERS (sore throat, sores in mouth, or a toothache) UNUSUAL RASH, SWELLING OR PAIN  UNUSUAL VAGINAL DISCHARGE OR ITCHING   Items with * indicate a potential emergency and should be followed up as soon as possible or go to the Emergency Department if any problems should occur.  Please show the CHEMOTHERAPY ALERT CARD or IMMUNOTHERAPY ALERT CARD at check-in  to the Emergency Department and triage nurse.  Should you have questions after your visit or need to cancel or reschedule your appointment, please contact [facility]  [phone] and follow the prompts.  Office hours are 8:00 a.m. to 4:30 p.m. Monday - Friday. Please note that voicemails left after 4:00 p.m. may not be returned until the following business day.  We are closed weekends and major holidays. You have access to a nurse at all times for urgent questions. Please call the main number to the clinic [phone] and follow the prompts.  For any non-urgent questions, you may also contact your provider using MyChart. We now offer e-Visits for anyone 62 and older to request care online for non-urgent symptoms. For details visit [email or web address].   Also download the MyChart app! Go to the app store, search "MyChart", open the app, select [facility], and log in with your MyChart username and password.  Due to Covid, a mask is required upon entering the hospital/clinic. If you do not have a mask, one will be given to you upon arrival. For doctor visits, patients may have 1 support person aged 2 or older with them. For treatment visits, patients cannot have anyone with them due to current Covid guidelines and our immunocompromised population.

## 2020-10-09 NOTE — Progress Notes (Signed)
Patient here for oncology follow-up appointment, concerns about injection

## 2020-10-11 ENCOUNTER — Other Ambulatory Visit: Payer: Self-pay

## 2020-10-11 ENCOUNTER — Inpatient Hospital Stay: Payer: Self-pay

## 2020-10-11 DIAGNOSIS — C61 Malignant neoplasm of prostate: Secondary | ICD-10-CM

## 2020-10-11 MED ORDER — PEGFILGRASTIM-JMDB 6 MG/0.6ML ~~LOC~~ SOSY
6.0000 mg | PREFILLED_SYRINGE | Freq: Once | SUBCUTANEOUS | Status: AC
  Administered 2020-10-11: 6 mg via SUBCUTANEOUS
  Filled 2020-10-11: qty 0.6

## 2020-10-12 ENCOUNTER — Inpatient Hospital Stay: Payer: Self-pay

## 2020-10-30 ENCOUNTER — Other Ambulatory Visit: Payer: Self-pay

## 2020-10-30 ENCOUNTER — Encounter: Payer: Self-pay | Admitting: Oncology

## 2020-10-30 ENCOUNTER — Inpatient Hospital Stay: Payer: Self-pay

## 2020-10-30 ENCOUNTER — Inpatient Hospital Stay (HOSPITAL_BASED_OUTPATIENT_CLINIC_OR_DEPARTMENT_OTHER): Payer: Self-pay | Admitting: Oncology

## 2020-10-30 VITALS — BP 124/70 | HR 68 | Temp 96.8°F | Resp 18 | Wt 178.1 lb

## 2020-10-30 DIAGNOSIS — R739 Hyperglycemia, unspecified: Secondary | ICD-10-CM

## 2020-10-30 DIAGNOSIS — Z79818 Long term (current) use of other agents affecting estrogen receptors and estrogen levels: Secondary | ICD-10-CM

## 2020-10-30 DIAGNOSIS — Z5111 Encounter for antineoplastic chemotherapy: Secondary | ICD-10-CM

## 2020-10-30 DIAGNOSIS — C61 Malignant neoplasm of prostate: Secondary | ICD-10-CM

## 2020-10-30 DIAGNOSIS — C7951 Secondary malignant neoplasm of bone: Secondary | ICD-10-CM

## 2020-10-30 DIAGNOSIS — G893 Neoplasm related pain (acute) (chronic): Secondary | ICD-10-CM

## 2020-10-30 LAB — COMPREHENSIVE METABOLIC PANEL
ALT: 39 U/L (ref 0–44)
AST: 27 U/L (ref 15–41)
Albumin: 3.5 g/dL (ref 3.5–5.0)
Alkaline Phosphatase: 79 U/L (ref 38–126)
Anion gap: 9 (ref 5–15)
BUN: 22 mg/dL (ref 8–23)
CO2: 23 mmol/L (ref 22–32)
Calcium: 9.1 mg/dL (ref 8.9–10.3)
Chloride: 106 mmol/L (ref 98–111)
Creatinine, Ser: 0.67 mg/dL (ref 0.61–1.24)
GFR, Estimated: >60 mL/min (ref >60)
Glucose, Bld: 185 mg/dL — ABNORMAL HIGH (ref 70–99)
Potassium: 4.1 mmol/L (ref 3.5–5.1)
Sodium: 138 mmol/L (ref 135–145)
Total Bilirubin: 0.4 mg/dL (ref 0.3–1.2)
Total Protein: 6.4 g/dL — ABNORMAL LOW (ref 6.5–8.1)

## 2020-10-30 LAB — CBC WITH DIFFERENTIAL/PLATELET
Abs Immature Granulocytes: 0.02 10*3/uL (ref 0.00–0.07)
Basophils Absolute: 0 10*3/uL (ref 0.0–0.1)
Basophils Relative: 0 %
Eosinophils Absolute: 0 10*3/uL (ref 0.0–0.5)
Eosinophils Relative: 0 %
HCT: 28.3 % — ABNORMAL LOW (ref 39.0–52.0)
Hemoglobin: 9.1 g/dL — ABNORMAL LOW (ref 13.0–17.0)
Immature Granulocytes: 0 %
Lymphocytes Relative: 14 %
Lymphs Abs: 1 10*3/uL (ref 0.7–4.0)
MCH: 29.4 pg (ref 26.0–34.0)
MCHC: 32.2 g/dL (ref 30.0–36.0)
MCV: 91.3 fL (ref 80.0–100.0)
Monocytes Absolute: 0.8 10*3/uL (ref 0.1–1.0)
Monocytes Relative: 10 %
Neutro Abs: 5.8 10*3/uL (ref 1.7–7.7)
Neutrophils Relative %: 76 %
Platelets: 328 10*3/uL (ref 150–400)
RBC: 3.1 MIL/uL — ABNORMAL LOW (ref 4.22–5.81)
RDW: 18 % — ABNORMAL HIGH (ref 11.5–15.5)
WBC: 7.6 10*3/uL (ref 4.0–10.5)
nRBC: 0 % (ref 0.0–0.2)

## 2020-10-30 LAB — HEMOGLOBIN A1C
Hgb A1c MFr Bld: 6 % — ABNORMAL HIGH (ref 4.8–5.6)
Mean Plasma Glucose: 125.5 mg/dL

## 2020-10-30 LAB — PSA: Prostatic Specific Antigen: 0.01 ng/mL (ref 0.00–4.00)

## 2020-10-30 MED ORDER — HEPARIN SOD (PORK) LOCK FLUSH 100 UNIT/ML IV SOLN
INTRAVENOUS | Status: AC
  Administered 2020-10-30: 500 [IU]
  Filled 2020-10-30: qty 5

## 2020-10-30 MED ORDER — SODIUM CHLORIDE 0.9 % IV SOLN
10.0000 mg | Freq: Once | INTRAVENOUS | Status: AC
  Administered 2020-10-30: 10 mg via INTRAVENOUS
  Filled 2020-10-30: qty 10

## 2020-10-30 MED ORDER — SODIUM CHLORIDE 0.9% FLUSH
10.0000 mL | Freq: Once | INTRAVENOUS | Status: AC
Start: 2020-10-30 — End: 2020-10-30
  Administered 2020-10-30: 10 mL via INTRAVENOUS
  Filled 2020-10-30: qty 10

## 2020-10-30 MED ORDER — HEPARIN SOD (PORK) LOCK FLUSH 100 UNIT/ML IV SOLN
500.0000 [IU] | Freq: Once | INTRAVENOUS | Status: AC | PRN
  Filled 2020-10-30: qty 5

## 2020-10-30 MED ORDER — SODIUM CHLORIDE 0.9 % IV SOLN
75.0000 mg/m2 | Freq: Once | INTRAVENOUS | Status: AC
  Administered 2020-10-30: 150 mg via INTRAVENOUS
  Filled 2020-10-30: qty 15

## 2020-10-30 MED ORDER — SODIUM CHLORIDE 0.9 % IV SOLN
Freq: Once | INTRAVENOUS | Status: AC
  Filled 2020-10-30: qty 250

## 2020-10-30 NOTE — Patient Instructions (Signed)
[facility]  Discharge Instructions: Thank you for choosing [facility] to provide your oncology and hematology care.  If you have a lab appointment with the [facility], please go directly to the [facility] and check in at the registration area.  Wear comfortable clothing and clothing appropriate for easy access to any Portacath or PICC line.   We strive to give you quality time with your provider. You may need to reschedule your appointment if you arrive late (15 or more minutes).  Arriving late affects you and other patients whose appointments are after yours.  Also, if you miss three or more appointments without notifying the office, you may be dismissed from the clinic at the provider's discretion.      For prescription refill requests, have your pharmacy contact our office and allow 72 hours for refills to be completed.    Today you received the following chemotherapy and/or immunotherapy agents Docetaxel       To help prevent nausea and vomiting after your treatment, we encourage you to take your nausea medication as directed.  BELOW ARE SYMPTOMS THAT SHOULD BE REPORTED IMMEDIATELY: *FEVER GREATER THAN 100.4 F (38 C) OR HIGHER *CHILLS OR SWEATING *NAUSEA AND VOMITING THAT IS NOT CONTROLLED WITH YOUR NAUSEA MEDICATION *UNUSUAL SHORTNESS OF BREATH *UNUSUAL BRUISING OR BLEEDING *URINARY PROBLEMS (pain or burning when urinating, or frequent urination) *BOWEL PROBLEMS (unusual diarrhea, constipation, pain near the anus) TENDERNESS IN MOUTH AND THROAT WITH OR WITHOUT PRESENCE OF ULCERS (sore throat, sores in mouth, or a toothache) UNUSUAL RASH, SWELLING OR PAIN  UNUSUAL VAGINAL DISCHARGE OR ITCHING   Items with * indicate a potential emergency and should be followed up as soon as possible or go to the Emergency Department if any problems should occur.  Please show the CHEMOTHERAPY ALERT CARD or IMMUNOTHERAPY ALERT CARD at check-in  to the Emergency Department and triage nurse.  Should you have questions after your visit or need to cancel or reschedule your appointment, please contact [facility]  [phone] and follow the prompts.  Office hours are 8:00 a.m. to 4:30 p.m. Monday - Friday. Please note that voicemails left after 4:00 p.m. may not be returned until the following business day.  We are closed weekends and major holidays. You have access to a nurse at all times for urgent questions. Please call the main number to the clinic [phone] and follow the prompts.  For any non-urgent questions, you may also contact your provider using MyChart. We now offer e-Visits for anyone 90 and older to request care online for non-urgent symptoms. For details visit [email or web address].   Also download the MyChart app! Go to the app store, search "MyChart", open the app, select [facility], and log in with your MyChart username and password.  Due to Covid, a mask is required upon entering the hospital/clinic. If you do not have a mask, one will be given to you upon arrival. For doctor visits, patients may have 1 support person aged 76 or older with them. For treatment visits, patients cannot have anyone with them due to current Covid guidelines and our immunocompromised population.

## 2020-10-30 NOTE — Progress Notes (Signed)
Pt here for follow up. No new concerns voiced.   

## 2020-10-30 NOTE — Progress Notes (Signed)
Hematology/Oncology progress note [facility] [phone] [phone]   Patient Care Team: Patient, No Pcp Per (Inactive) as PCP - General (General Practice)  REFERRING PROVIDER: No ref. provider found  CHIEF COMPLAINTS/REASON FOR VISIT:  Follow-up for prostate cancer treatment  HISTORY OF PRESENTING ILLNESS:   Cody Vaughan is a  60 y.o.  male with PMH listed below was seen in consultation at the request of  No ref. provider found  for evaluation of bone lesions 05/27/2020 patient presented to emergency room for evaluation of generalized weakness and and body pain.  Prior to the presentations, patient also had history of tooth abscess and was prescribed antibiotics. Reports acute on chronic right-sided lumbar pain without any prior trauma history.  Profound weakness.  Decreased appetite and loss of weight about 10 pounds during the past few months.  Also complained intermittent right upper quadrant discomfort  326 04/10/2020 CT abdomen pelvis showed a diffusely patchy sclerotic appearance of the osseous structures suspicious for osseous metastatic disease.  Prostate gland is heterogeneous in appearance.  Correlate with PSA.  Patient had a mild superior endplate compression deformity of L4 with less than 10% vertebral body height loss.  H indeterminate.  Fusiform infrarenal abdominal aortic aneurysm measuring up to 3.5 cm.  Follow-up in 2 years. Suspicious findings for early acute uncomplicated appendicitis.  Patient denies any right lower quadrant pain.  In the emergency room, he has a negative Murphy sign.  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  Highest Gleason score 5+4 Postprocedure, patient has had hematuria and hematochezia, bilateral lower extremity pain, AKI orthostatic hypovolemia, sepsis and was admitted and treated.  Patient was discharged on a course of  antibiotics.  06/18/2020 MRI thoracic, lumbar, sacrum is compatible with diffuse osseous metastasis in the visualized thoracic lumbar spine, sacrum and bilateral iliac bones.  No definitive dural based tumor is identified.  No compression fracture.  Spine spondylosis  06/20/2019, patient received loading dose of Firmagon 240 mg x 1. #06/22/2020, bone scan showed widespread osseous metastatic disease involving the appendicular and axial skeleton #April 2022. COVID infection. #07/10/2020, Dr. Lucky Cowboy placed Mediport #07/17/2020, Start  docetaxel.  Firmagon x1  #INTERVAL HISTORY Cody Vaughan is a 60 y.o. male who has above history reviewed by me today presents for follow up visit for management of metastatic prostate cancer Problems and complaints are listed below He tolerates chemotherapy well.  Denies any nausea vomiting diarrhea. Patient takes 1 OxyContin on most of his days for pain.  He also takes Mobic every day. He remains active and works on [employer].   Review of Systems  Constitutional:  Positive for fatigue. Negative for appetite change, chills, fever and unexpected weight change.       Patient walks independently  HENT:   Negative for hearing loss and voice change.   Eyes:  Negative for eye problems and icterus.  Respiratory:  Negative for chest tightness, cough and shortness of breath.   Cardiovascular:  Negative for chest pain and leg swelling.  Gastrointestinal:  Negative for abdominal distention and abdominal pain.  Endocrine: Positive for hot flashes.  Genitourinary:  Negative for difficulty urinating, dysuria and frequency.   Musculoskeletal:  Negative for arthralgias and back pain.  Skin:  Negative for itching and rash.  Neurological:  Negative for light-headedness and numbness.  Hematological:  Negative for adenopathy. Does not bruise/bleed easily.  Psychiatric/Behavioral:  Negative for confusion.  MEDICAL HISTORY:  Past Medical History:  Diagnosis Date    Prostate cancer ([facility])    2022    SURGICAL HISTORY: Past Surgical History:  Procedure Laterality Date   PORTA CATH INSERTION N/A 07/10/2020   Procedure: PORTA CATH INSERTION;  Surgeon: Algernon Huxley, MD;  Location: [facility];  Service: Cardiovascular;  Laterality: N/A;   PROSTATE BIOPSY N/A 06/16/2020   Procedure: PROSTATE BIOPSY;  Surgeon: Billey Co, MD;  Location: [facility];  Service: Urology;  Laterality: N/A;   SHOULDER SURGERY     1985   TRANSRECTAL ULTRASOUND N/A 06/16/2020   Procedure: TRANSRECTAL ULTRASOUND;  Surgeon: Billey Co, MD;  Location: [facility];  Service: Urology;  Laterality: N/A;    SOCIAL HISTORY: Social History   Socioeconomic History   Marital status: Married    Spouse name: Not on file   Number of children: Not on file   Years of education: Not on file   Highest education level: Not on file  Occupational History   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  Tobacco Use   Smoking status: Every Day    Packs/day: 0.75    Types: Cigarettes   Smokeless tobacco: Never  Substance and Sexual Activity   Alcohol use: Not Currently   Drug use: Never   Sexual activity: Not Currently  Other Topics Concern   Not on file  Social History Narrative   Not on file   Social Determinants of Health   Financial Resource Strain: Not on file  Food Insecurity: Not on file  Transportation Needs: Not on file  Physical Activity: Not on file  Stress: Not on file  Social Connections: Not on file  Intimate Partner Violence: Not on file    FAMILY HISTORY: Family History  Problem Relation Age of Onset   COPD Mother    Heart attack Father     ALLERGIES:  has No Known Allergies.  MEDICATIONS:  Current Outpatient Medications  Medication Sig Dispense Refill   acetaminophen (TYLENOL) 325 MG tablet Take 650 mg by mouth every 6 (six) hours as needed for moderate pain or mild pain.     Calcium 600-400 MG-UNIT CHEW Chew 2  tablets by mouth daily. 60 tablet 3   dexamethasone (DECADRON) 4 MG tablet Take 2 tablets (8 mg total) by mouth 2 (two) times daily. Start the day before Taxotere. Then daily after chemo for 2 days. 30 tablet 1   lidocaine-prilocaine (EMLA) cream Apply to affected area once 30 g 3   meloxicam (MOBIC) 7.5 MG tablet Take 1 tablet (7.5 mg total) by mouth daily as needed for pain. 60 tablet 1   mirtazapine (REMERON) 15 MG tablet Take 1 tablet (15 mg total) by mouth at bedtime. 30 tablet 2   Multiple Vitamins-Minerals (MULTIVITAMIN WITH MINERALS) tablet Take 1 tablet by mouth daily.     omeprazole (PRILOSEC) 20 MG capsule Take 1 capsule (20 mg total) by mouth daily. 30 capsule 1   oxyCODONE (OXYCONTIN) 10 mg 12 hr tablet Take 1 tablet (10 mg total) by mouth every 12 (twelve) hours. 60 tablet 0   nicotine (NICODERM CQ - DOSED IN MG/24 HOURS) 21 mg/24hr patch Place 1 patch (21 mg total) onto the skin daily. (Patient not taking: No sig reported) 28 patch 0   ondansetron (ZOFRAN) 8 MG tablet Take 1 tablet (8 mg total) by mouth 2 (two) times daily as needed for refractory nausea / vomiting. (Patient not taking:  No sig reported) 30 tablet 1   oxyCODONE (OXY IR/ROXICODONE) 5 MG immediate release tablet Take 1-2 tablets (5-10 mg total) by mouth every 4 (four) hours as needed for severe pain. (Patient not taking: Reported on 10/30/2020) 60 tablet 0   prochlorperazine (COMPAZINE) 10 MG tablet Take 1 tablet (10 mg total) by mouth every 6 (six) hours as needed (Nausea or vomiting). (Patient not taking: No sig reported) 30 tablet 1   No current facility-administered medications for this visit.     PHYSICAL EXAMINATION: ECOG PERFORMANCE STATUS: 1 - Symptomatic but completely ambulatory Vitals:   10/30/20 0904  BP: 124/70  Pulse: 68  Resp: 18  Temp: (!) 96.8 F (36 C)   Filed Weights   10/30/20 0904  Weight: 178 lb 1.6 oz (80.8 kg)    Physical Exam Constitutional:      General: He is not in acute  distress. HENT:     Head: Normocephalic and atraumatic.  Eyes:     General: No scleral icterus. Cardiovascular:     Rate and Rhythm: Normal rate and regular rhythm.     Heart sounds: Normal heart sounds.  Pulmonary:     Effort: Pulmonary effort is normal. No respiratory distress.     Breath sounds: No wheezing.  Abdominal:     General: Bowel sounds are normal. There is no distension.     Palpations: Abdomen is soft.  Musculoskeletal:        General: No deformity. Normal range of motion.     Cervical back: Normal range of motion and neck supple.  Skin:    General: Skin is warm and dry.     Findings: No erythema or rash.  Neurological:     Mental Status: He is alert and oriented to person, place, and time. Mental status is at baseline.     Cranial Nerves: No cranial nerve deficit.     Coordination: Coordination normal.  Psychiatric:        Mood and Affect: Mood normal.    LABORATORY DATA:  I have reviewed the data as listed Lab Results  Component Value Date   WBC 7.6 10/30/2020   HGB 9.1 (L) 10/30/2020   HCT 28.3 (L) 10/30/2020   MCV 91.3 10/30/2020   PLT 328 10/30/2020   Recent Labs    09/18/20 0903 10/09/20 0822 10/30/20 0833  NA 138 136 138  K 3.9 3.8 4.1  CL 108 104 106  CO2 '22 24 23  '$ GLUCOSE 198* 198* 185*  BUN '16 19 22  '$ CREATININE 0.63 0.72 0.67  CALCIUM 8.7* 8.7* 9.1  GFRNONAA >60 >60 >60  PROT 6.3* 6.3* 6.4*  ALBUMIN 3.2* 3.4* 3.5  AST '26 27 27  '$ ALT 33 34 39  ALKPHOS 88 79 79  BILITOT 0.3 0.3 0.4    Iron/TIBC/Ferritin/ %Sat    Component Value Date/Time   IRON 121 07/17/2020 1000   TIBC 384 07/17/2020 1000   FERRITIN 299 07/17/2020 1000   IRONPCTSAT 32 07/17/2020 1000      RADIOGRAPHIC STUDIES: I have personally reviewed the radiological images as listed and agreed with the findings in the report. No results found.     ASSESSMENT & PLAN:  1. Prostate cancer ([place])   2. Encounter for antineoplastic chemotherapy   3. Neoplasm related  pain   4. Androgen deprivation therapy   5. Bone metastasis ([place])   Cancer Staging Prostate cancer [facility]) Staging form: Prostate, AJCC 8th Edition - Clinical stage from 06/23/2020: Stage IVB (cT2c, cNX,  cM1b, PSA: 678, Grade Group: 5) - Signed by Earlie Server, MD on 06/23/2020   #Metastatic prostate cancer-castration sensitive On  androgen deprivation therapy.--08/28/20 PSA has decreased to 0.01  Eligard '45mg'$  on 08/15/20 Labs reviewed and discussed with patient.  Proceed with cycle 5 docetaxel G-CSF support on day 3 .  #Anemia, hemoglobin has dropped to 9.1.  chemotherapy induced.  Adequate iron level, folate and vitamin B12. Continue to monitor.  Anticipate to improve after he finishes chemo.  #Extensive bone metastasis, neoplasm related pain.  Improved. He was on  OxyContin and PRN Oxycodone 5 mg  S/p radiation to hip. Currently just take Oxycontin 1-2 times per day. Patient also takes Mobic 7.5 mg daily. Recommend patient to alternate Mobic with OxyContin and Tylenol as needed for pain.  Supportive care measures are necessary for patient well-being and will be provided as necessary. We spent sufficient time to discuss many aspect of care, questions were answered to patient's satisfaction.   All questions were answered. The patient knows to call the clinic with any problems questions or concerns.  Return of visit:   For flushing 8 weeks Follow-up in  December 2022 for lab MD Eligard injection.    Earlie Server, MD, PhD Hematology Oncology [facility] Pager- [phone] 10/30/2020

## 2020-10-30 NOTE — Progress Notes (Signed)
Nutrition Follow-up:  Patient with metastatic prostate cancer.  Patient receiving docetaxel.    Spoke with patient via phone. Patient reports that his appetite continues to be good.  "My wife packs me all kinds of snacks."  Has been trying to drink more water and less sugary beverages.    Denies any nutrition impact symptoms at this time    Medications: reviewed  Labs: reviewed  Anthropometrics:   Weight 178 lb 1.6 oz today  174 lb on 8/8 170 lb 4.8 oz on 5/23 164 lb on 4/22 173 lb on 3/26   NUTRITION DIAGNOSIS: Inadequate oral intake improved   INTERVENTION:  Continue limiting sugary beverages to help blood glucose Patient to eat well balanced diet including good sources of protein Patient to contact RD if needed    NEXT VISIT: as needed  Cody Vaughan, [place] Registered Dietitian [phone] (mobile)

## 2020-11-01 ENCOUNTER — Inpatient Hospital Stay: Payer: Self-pay

## 2020-11-01 ENCOUNTER — Other Ambulatory Visit: Payer: Self-pay

## 2020-11-01 DIAGNOSIS — C61 Malignant neoplasm of prostate: Secondary | ICD-10-CM

## 2020-11-01 MED ORDER — PEGFILGRASTIM-JMDB 6 MG/0.6ML ~~LOC~~ SOSY
6.0000 mg | PREFILLED_SYRINGE | Freq: Once | SUBCUTANEOUS | Status: AC
  Administered 2020-11-01: 6 mg via SUBCUTANEOUS
  Filled 2020-11-01: qty 0.6

## 2020-11-17 ENCOUNTER — Other Ambulatory Visit: Payer: Self-pay | Admitting: *Deleted

## 2020-11-17 MED ORDER — MIRTAZAPINE 15 MG PO TABS
15.0000 mg | ORAL_TABLET | Freq: Every day | ORAL | 2 refills | Status: DC
Start: 2020-11-17 — End: 2021-03-20

## 2020-12-25 ENCOUNTER — Inpatient Hospital Stay: Payer: Self-pay | Attending: Oncology

## 2020-12-25 ENCOUNTER — Other Ambulatory Visit: Payer: Self-pay

## 2020-12-25 DIAGNOSIS — C61 Malignant neoplasm of prostate: Secondary | ICD-10-CM | POA: Insufficient documentation

## 2020-12-25 DIAGNOSIS — Z452 Encounter for adjustment and management of vascular access device: Secondary | ICD-10-CM | POA: Insufficient documentation

## 2020-12-25 DIAGNOSIS — Z95828 Presence of other vascular implants and grafts: Secondary | ICD-10-CM

## 2020-12-25 DIAGNOSIS — C7931 Secondary malignant neoplasm of brain: Secondary | ICD-10-CM | POA: Insufficient documentation

## 2020-12-25 MED ORDER — SODIUM CHLORIDE 0.9% FLUSH
10.0000 mL | INTRAVENOUS | Status: DC | PRN
  Administered 2020-12-25: 10 mL via INTRAVENOUS
  Filled 2020-12-25: qty 10

## 2020-12-25 MED ORDER — HEPARIN SOD (PORK) LOCK FLUSH 100 UNIT/ML IV SOLN
INTRAVENOUS | Status: AC
  Administered 2020-12-25: 500 [IU] via INTRAVENOUS
  Filled 2020-12-25: qty 5

## 2020-12-25 MED ORDER — HEPARIN SOD (PORK) LOCK FLUSH 100 UNIT/ML IV SOLN
500.0000 [IU] | Freq: Once | INTRAVENOUS | Status: AC
  Filled 2020-12-25: qty 5

## 2021-02-08 ENCOUNTER — Telehealth: Payer: Self-pay | Admitting: Oncology

## 2021-02-08 NOTE — Telephone Encounter (Signed)
Wife called wants all pt's appt on the same day. Please give her a call back at [phone]

## 2021-02-09 NOTE — Telephone Encounter (Signed)
Ok to schedule labs same day, per MD

## 2021-02-14 ENCOUNTER — Other Ambulatory Visit: Payer: Self-pay

## 2021-02-16 ENCOUNTER — Inpatient Hospital Stay: Payer: Self-pay

## 2021-02-16 ENCOUNTER — Other Ambulatory Visit: Payer: Self-pay

## 2021-02-16 ENCOUNTER — Encounter: Payer: Self-pay | Admitting: Oncology

## 2021-02-16 ENCOUNTER — Ambulatory Visit: Payer: Self-pay

## 2021-02-16 ENCOUNTER — Inpatient Hospital Stay: Payer: Self-pay | Attending: Oncology | Admitting: Oncology

## 2021-02-16 ENCOUNTER — Ambulatory Visit: Payer: Self-pay | Admitting: Oncology

## 2021-02-16 VITALS — BP 117/81 | HR 88 | Temp 96.9°F | Wt 188.5 lb

## 2021-02-16 DIAGNOSIS — Z7952 Long term (current) use of systemic steroids: Secondary | ICD-10-CM | POA: Insufficient documentation

## 2021-02-16 DIAGNOSIS — T451X5D Adverse effect of antineoplastic and immunosuppressive drugs, subsequent encounter: Secondary | ICD-10-CM | POA: Insufficient documentation

## 2021-02-16 DIAGNOSIS — Z79899 Other long term (current) drug therapy: Secondary | ICD-10-CM | POA: Insufficient documentation

## 2021-02-16 DIAGNOSIS — Z79818 Long term (current) use of other agents affecting estrogen receptors and estrogen levels: Secondary | ICD-10-CM

## 2021-02-16 DIAGNOSIS — IMO0001 Reserved for inherently not codable concepts without codable children: Secondary | ICD-10-CM

## 2021-02-16 DIAGNOSIS — C61 Malignant neoplasm of prostate: Secondary | ICD-10-CM

## 2021-02-16 DIAGNOSIS — Z5111 Encounter for antineoplastic chemotherapy: Secondary | ICD-10-CM

## 2021-02-16 DIAGNOSIS — C7951 Secondary malignant neoplasm of bone: Secondary | ICD-10-CM | POA: Insufficient documentation

## 2021-02-16 DIAGNOSIS — Z923 Personal history of irradiation: Secondary | ICD-10-CM | POA: Insufficient documentation

## 2021-02-16 DIAGNOSIS — G893 Neoplasm related pain (acute) (chronic): Secondary | ICD-10-CM

## 2021-02-16 DIAGNOSIS — D6481 Anemia due to antineoplastic chemotherapy: Secondary | ICD-10-CM | POA: Insufficient documentation

## 2021-02-16 LAB — COMPREHENSIVE METABOLIC PANEL
ALT: 46 U/L — ABNORMAL HIGH (ref 0–44)
AST: 32 U/L (ref 15–41)
Albumin: 3.6 g/dL (ref 3.5–5.0)
Alkaline Phosphatase: 87 U/L (ref 38–126)
Anion gap: 8 (ref 5–15)
BUN: 14 mg/dL (ref 8–23)
CO2: 24 mmol/L (ref 22–32)
Calcium: 9.2 mg/dL (ref 8.9–10.3)
Chloride: 104 mmol/L (ref 98–111)
Creatinine, Ser: 0.74 mg/dL (ref 0.61–1.24)
GFR, Estimated: >60 mL/min (ref >60)
Glucose, Bld: 131 mg/dL — ABNORMAL HIGH (ref 70–99)
Potassium: 3.9 mmol/L (ref 3.5–5.1)
Sodium: 136 mmol/L (ref 135–145)
Total Bilirubin: 0.2 mg/dL — ABNORMAL LOW (ref 0.3–1.2)
Total Protein: 7.2 g/dL (ref 6.5–8.1)

## 2021-02-16 LAB — CBC WITH DIFFERENTIAL/PLATELET
Abs Immature Granulocytes: 0.02 10*3/uL (ref 0.00–0.07)
Basophils Absolute: 0.1 10*3/uL (ref 0.0–0.1)
Basophils Relative: 2 %
Eosinophils Absolute: 0.9 10*3/uL — ABNORMAL HIGH (ref 0.0–0.5)
Eosinophils Relative: 12 %
HCT: 37.6 % — ABNORMAL LOW (ref 39.0–52.0)
Hemoglobin: 12.6 g/dL — ABNORMAL LOW (ref 13.0–17.0)
Immature Granulocytes: 0 %
Lymphocytes Relative: 28 %
Lymphs Abs: 2 10*3/uL (ref 0.7–4.0)
MCH: 28.8 pg (ref 26.0–34.0)
MCHC: 33.5 g/dL (ref 30.0–36.0)
MCV: 86 fL (ref 80.0–100.0)
Monocytes Absolute: 0.7 10*3/uL (ref 0.1–1.0)
Monocytes Relative: 10 %
Neutro Abs: 3.5 10*3/uL (ref 1.7–7.7)
Neutrophils Relative %: 48 %
Platelets: 233 10*3/uL (ref 150–400)
RBC: 4.37 MIL/uL (ref 4.22–5.81)
RDW: 15 % (ref 11.5–15.5)
WBC: 7.2 10*3/uL (ref 4.0–10.5)
nRBC: 0 % (ref 0.0–0.2)

## 2021-02-16 LAB — PSA: Prostatic Specific Antigen: 0.01 ng/mL (ref 0.00–4.00)

## 2021-02-16 MED ORDER — SODIUM CHLORIDE 0.9% FLUSH
10.0000 mL | Freq: Once | INTRAVENOUS | Status: AC
  Administered 2021-02-16: 10 mL via INTRAVENOUS
  Filled 2021-02-16: qty 10

## 2021-02-16 MED ORDER — HEPARIN SOD (PORK) LOCK FLUSH 100 UNIT/ML IV SOLN
INTRAVENOUS | Status: AC
  Filled 2021-02-16: qty 5

## 2021-02-16 MED ORDER — HEPARIN SOD (PORK) LOCK FLUSH 100 UNIT/ML IV SOLN
500.0000 [IU] | Freq: Once | INTRAVENOUS | Status: AC
  Administered 2021-02-16: 500 [IU] via INTRAVENOUS
  Filled 2021-02-16: qty 5

## 2021-02-16 MED ORDER — LEUPROLIDE ACETATE (6 MONTH) 45 MG ~~LOC~~ KIT
45.0000 mg | PACK | Freq: Once | SUBCUTANEOUS | Status: AC
  Administered 2021-02-16: 45 mg via SUBCUTANEOUS
  Filled 2021-02-16: qty 45

## 2021-02-16 NOTE — Addendum Note (Signed)
Addended by: Evelina Dun on: 02/16/2021 11:11 AM   Modules accepted: Orders

## 2021-02-16 NOTE — Progress Notes (Signed)
Hematology/Oncology progress note [phone] [phone]) [phone]   Patient Care Team: Patient, No Pcp Per (Inactive) as PCP - General (General Practice)  REFERRING PROVIDER: No ref. provider found  CHIEF COMPLAINTS/REASON FOR VISIT:  Follow-up for prostate cancer treatment  HISTORY OF PRESENTING ILLNESS:   Cody Vaughan is a  60 y.o.  male with PMH listed below was seen in consultation at the request of  No ref. provider found  for evaluation of bone lesions 05/27/2020 patient presented to emergency room for evaluation of generalized weakness and and body pain.  Prior to the presentations, patient also had history of tooth abscess and was prescribed antibiotics. Reports acute on chronic right-sided lumbar pain without any prior trauma history.  Profound weakness.  Decreased appetite and loss of weight about 10 pounds during the past few months.  Also complained intermittent right upper quadrant discomfort  326 04/10/2020 CT abdomen pelvis showed a diffusely patchy sclerotic appearance of the osseous structures suspicious for osseous metastatic disease.  Prostate gland is heterogeneous in appearance.  Correlate with PSA.  Patient had a mild superior endplate compression deformity of L4 with less than 10% vertebral body height loss.  H indeterminate.  Fusiform infrarenal abdominal aortic aneurysm measuring up to 3.5 cm.  Follow-up in 2 years. Suspicious findings for early acute uncomplicated appendicitis.  Patient denies any right lower quadrant pain.  In the emergency room, he has a negative Murphy sign.  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  Highest Gleason score 5+4 Postprocedure, patient has had hematuria and hematochezia, bilateral lower extremity pain, AKI orthostatic hypovolemia, sepsis and was admitted and treated.  Patient was discharged on a course of antibiotics.  06/18/2020 MRI thoracic,  lumbar, sacrum is compatible with diffuse osseous metastasis in the visualized thoracic lumbar spine, sacrum and bilateral iliac bones.  No definitive dural based tumor is identified.  No compression fracture.  Spine spondylosis  06/20/2019, patient received loading dose of Firmagon 240 mg x 1. #06/22/2020, bone scan showed widespread osseous metastatic disease involving the appendicular and axial skeleton #April 2022. COVID infection. #07/10/2020, Dr. Lucky Cowboy placed Mediport #07/17/2020, Start  docetaxel.  Firmagon x1  #INTERVAL HISTORY Cody Vaughan is a 60 y.o. male who has above history reviewed by me today presents for follow up visit for management of metastatic prostate cancer, Patient finished 6 cycles of docetaxel chemotherapy. Today he reports feeling fine.  He Has gained weight.    Review of Systems  Constitutional:  Positive for fatigue. Negative for appetite change, chills, fever and unexpected weight change.       Patient walks independently  HENT:   Negative for hearing loss and voice change.   Eyes:  Negative for eye problems and icterus.  Respiratory:  Negative for chest tightness, cough and shortness of breath.   Cardiovascular:  Negative for chest pain and leg swelling.  Gastrointestinal:  Negative for abdominal distention and abdominal pain.  Endocrine: Positive for hot flashes.  Genitourinary:  Negative for difficulty urinating, dysuria and frequency.   Musculoskeletal:  Negative for arthralgias and back pain.  Skin:  Negative for itching and rash.  Neurological:  Negative for light-headedness and numbness.  Hematological:  Negative for adenopathy. Does not bruise/bleed easily.  Psychiatric/Behavioral:  Negative for confusion.    MEDICAL HISTORY:  Past Medical History:  Diagnosis Date   Prostate cancer ([place])    2022    SURGICAL HISTORY: Past Surgical History:  Procedure Laterality Date   PORTA CATH INSERTION N/A 07/10/2020   Procedure: PORTA CATH INSERTION;   Surgeon: Algernon Huxley, MD;  Location: [facility];  Service: Cardiovascular;  Laterality: N/A;   PROSTATE BIOPSY N/A 06/16/2020   Procedure: PROSTATE BIOPSY;  Surgeon: Billey Co, MD;  Location: [facility];  Service: Urology;  Laterality: N/A;   SHOULDER SURGERY     1985   TRANSRECTAL ULTRASOUND N/A 06/16/2020   Procedure: TRANSRECTAL ULTRASOUND;  Surgeon: Billey Co, MD;  Location: [facility];  Service: Urology;  Laterality: N/A;    SOCIAL HISTORY: Social History   Socioeconomic History   Marital status: Married    Spouse name: Not on file   Number of children: Not on file   Years of education: Not on file   Highest education level: Not on file  Occupational History   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  Tobacco Use   Smoking status: Every Day    Packs/day: 0.75    Types: Cigarettes   Smokeless tobacco: Never  Substance and Sexual Activity   Alcohol use: Not Currently   Drug use: Never   Sexual activity: Not Currently  Other Topics Concern   Not on file  Social History Narrative   Not on file   Social Determinants of Health   Financial Resource Strain: Not on file  Food Insecurity: Not on file  Transportation Needs: Not on file  Physical Activity: Not on file  Stress: Not on file  Social Connections: Not on file  Intimate Partner Violence: Not on file    FAMILY HISTORY: Family History  Problem Relation Age of Onset   COPD Mother    Heart attack Father     ALLERGIES:  has No Known Allergies.  MEDICATIONS:  Current Outpatient Medications  Medication Sig Dispense Refill   acetaminophen (TYLENOL) 325 MG tablet Take 650 mg by mouth every 6 (six) hours as needed for moderate pain or mild pain.     Calcium 600-400 MG-UNIT CHEW Chew 2 tablets by mouth daily. 60 tablet 3   Multiple Vitamins-Minerals (MULTIVITAMIN WITH MINERALS) tablet Take 1 tablet by mouth daily.     omeprazole (PRILOSEC) 20 MG capsule Take 1  capsule (20 mg total) by mouth daily. 30 capsule 1   dexamethasone (DECADRON) 4 MG tablet Take 2 tablets (8 mg total) by mouth 2 (two) times daily. Start the day before Taxotere. Then daily after chemo for 2 days. (Patient not taking: Reported on 02/16/2021) 30 tablet 1   lidocaine-prilocaine (EMLA) cream Apply to affected area once (Patient not taking: Reported on 02/16/2021) 30 g 3   meloxicam (MOBIC) 7.5 MG tablet Take 1 tablet (7.5 mg total) by mouth daily as needed for pain. (Patient not taking: Reported on 02/16/2021) 60 tablet 1   mirtazapine (REMERON) 15 MG tablet Take 1 tablet (15 mg total) by mouth at bedtime. (Patient not taking: Reported on 02/16/2021) 30 tablet 2   nicotine (NICODERM CQ - DOSED IN MG/24 HOURS) 21 mg/24hr patch Place 1 patch (21 mg total) onto the skin daily. (Patient not taking: Reported on 02/16/2021) 28 patch 0   ondansetron (ZOFRAN) 8 MG tablet Take 1 tablet (8 mg total) by mouth 2 (two) times daily as needed for refractory nausea / vomiting. (Patient not taking: Reported on 02/16/2021) 30 tablet 1   oxyCODONE (OXY IR/ROXICODONE) 5 MG immediate release tablet Take 1-2 tablets (5-10 mg total) by mouth every 4 (four)  hours as needed for severe pain. (Patient not taking: Reported on 02/16/2021) 60 tablet 0   oxyCODONE (OXYCONTIN) 10 mg 12 hr tablet Take 1 tablet (10 mg total) by mouth every 12 (twelve) hours. (Patient not taking: Reported on 02/16/2021) 60 tablet 0   prochlorperazine (COMPAZINE) 10 MG tablet Take 1 tablet (10 mg total) by mouth every 6 (six) hours as needed (Nausea or vomiting). (Patient not taking: Reported on 02/16/2021) 30 tablet 1   No current facility-administered medications for this visit.   Facility-Administered Medications Ordered in Other Visits  Medication Dose Route Frequency Provider Last Rate Last Admin   heparin lock flush 100 UNIT/ML injection              PHYSICAL EXAMINATION: ECOG PERFORMANCE STATUS: 0 - Asymptomatic Vitals:    02/16/21 1027  BP: 117/81  Pulse: 88  Temp: (!) 96.9 F (36.1 C)   Filed Weights   02/16/21 1027  Weight: 188 lb 8 oz (85.5 kg)    Physical Exam Constitutional:      General: He is not in acute distress. HENT:     Head: Normocephalic and atraumatic.  Eyes:     General: No scleral icterus. Cardiovascular:     Rate and Rhythm: Normal rate and regular rhythm.     Heart sounds: Normal heart sounds.  Pulmonary:     Effort: Pulmonary effort is normal. No respiratory distress.     Breath sounds: No wheezing.  Abdominal:     General: Bowel sounds are normal. There is no distension.     Palpations: Abdomen is soft.  Musculoskeletal:        General: No deformity. Normal range of motion.     Cervical back: Normal range of motion and neck supple.  Skin:    General: Skin is warm and dry.     Findings: No erythema or rash.  Neurological:     Mental Status: He is alert and oriented to person, place, and time. Mental status is at baseline.     Cranial Nerves: No cranial nerve deficit.     Coordination: Coordination normal.  Psychiatric:        Mood and Affect: Mood normal.    LABORATORY DATA:  I have reviewed the data as listed Lab Results  Component Value Date   WBC 7.2 02/16/2021   HGB 12.6 (L) 02/16/2021   HCT 37.6 (L) 02/16/2021   MCV 86.0 02/16/2021   PLT 233 02/16/2021   Recent Labs    10/09/20 0822 10/30/20 0833 02/16/21 1008  NA 136 138 136  K 3.8 4.1 3.9  CL 104 106 104  CO2 24 23 24   GLUCOSE 198* 185* 131*  BUN 19 22 14   CREATININE 0.72 0.67 0.74  CALCIUM 8.7* 9.1 9.2  GFRNONAA >60 >60 >60  PROT 6.3* 6.4* 7.2  ALBUMIN 3.4* 3.5 3.6  AST 27 27 32  ALT 34 39 46*  ALKPHOS 79 79 87  BILITOT 0.3 0.4 0.2*    Iron/TIBC/Ferritin/ %Sat    Component Value Date/Time   IRON 121 07/17/2020 1000   TIBC 384 07/17/2020 1000   FERRITIN 299 07/17/2020 1000   IRONPCTSAT 32 07/17/2020 1000      RADIOGRAPHIC STUDIES: I have personally reviewed the radiological  images as listed and agreed with the findings in the report. No results found.     ASSESSMENT & PLAN:  1. Prostate cancer ([place])   2. Encounter for antineoplastic chemotherapy   3. Neoplasm related pain  4. Androgen deprivation therapy    Cancer Staging  Prostate cancer [facility]) Staging form: Prostate, AJCC 8th Edition - Clinical stage from 06/23/2020: Stage IVB (cT2c, cNX, cM1b, PSA: 678, Grade Group: 5) - Signed by Earlie Server, MD on 06/23/2020   #Metastatic prostate cancer-castration sensitive On  androgen deprivation therapy.--08/28/20 PSA has decreased to 0.01  Eligard 45mg  x1 today. Labs reviewed and discussed with patient. Discussed with patient that based on results from the ARASENS trial, the combination of darolutamide plus docetaxel is an approved option for treatment of metastatic CSPC.  [Improved OS, time to develop castration resistant disease] Another option is Abiateron based on PEACE 1 trial [improved OS, Rationale and potential side effects were reviewed and discussed with patient.  He is open to option of adding oral agent.  Patient currently does not have insurance and will check if he meets medication assistance program for darolutamide or Abiateron.  #Anemia, hemoglobin has dropped to 9.1.  chemotherapy induced.  Adequate iron level, folate and vitamin B12. Continue to monitor.  Anticipate to improve after he finishes chemo.  #Extensive bone metastasis, neoplasm related pain.  Improved. S/p radiation to hip.   #Port-A-Cath in place, continue port flush every 8 weeks.  Recommend patient to establish with primary care physician for general health maintenance.  List of local PCP offices provided to patient.  Supportive care measures are necessary for patient well-being and will be provided as necessary. We spent sufficient time to discuss many aspect of care, questions were answered to patient's satisfaction.   All questions were answered. The patient knows to call  the clinic with any problems questions or concerns.  Return of visit: 3 months lab MD CBC CMP PSA  Follow-up in  December 2022 for lab MD Eligard injection.    Earlie Server, MD, PhD  02/16/2021

## 2021-02-19 ENCOUNTER — Telehealth: Payer: Self-pay | Admitting: Pharmacist

## 2021-02-19 ENCOUNTER — Other Ambulatory Visit: Payer: Self-pay

## 2021-02-19 DIAGNOSIS — C61 Malignant neoplasm of prostate: Secondary | ICD-10-CM

## 2021-02-19 NOTE — Telephone Encounter (Signed)
Oral Oncology Pharmacist Encounter  Received new prescription for Nubeqa (darolutamide) for the treatment of metastatic castration sensitive prostate cancer, planned duration until disease progression or unacceptable drug toxicity. Patient is s/p docetaxel, darolutamide is being added on to follow the ARASENS study.   CMP from 02/26/21 assessed, no relevant lab abnormalities. Prescription dose and frequency assessed.   Current medication list in Epic reviewed, no DDIs with darolutamide identified.  Evaluated chart and no patient barriers to medication adherence identified.   Prescription has been e-scribed to the [facility] for benefits analysis and approval.  Oral Oncology Clinic will continue to follow for insurance authorization, copayment issues, initial counseling and start date.  Patient agreed to treatment on 02/16/21 per MD documentation.  Darl Pikes, PharmD, BCPS, BCOP, CPP Hematology/Oncology Clinical Pharmacist Practitioner [place]/DB/AP [facility] [phone]  02/19/2021 1:10 PM

## 2021-02-20 ENCOUNTER — Telehealth: Payer: Self-pay | Admitting: Pharmacy Technician

## 2021-02-20 ENCOUNTER — Encounter: Payer: Self-pay | Admitting: Licensed Clinical Social Worker

## 2021-02-20 NOTE — Telephone Encounter (Signed)
Oral Oncology Patient Advocate Encounter  Patient and his wife, [name], stopped by the office today to sign an application for [employer] [place] Patient [employer] for [place]. If approved, patient will receive medication at no out of pocket cost to him.  I also talked to the patient about receiving a free trial of Nubeqa to get him started.  He was interested and form is pending MD signature.  I will continue to update this encounter until final determination.  [place] Patient [place] Phone [phone] Fax [phone] 02/20/2021 11:44 AM

## 2021-02-20 NOTE — Progress Notes (Signed)
[place] Work  Initial Assessment   Cody Vaughan is a 60 y.o. year old male contacted by phone. Clinical Social Work was referred by [facility] RN for assessment of psychosocial needs.   SDOH (Social Determinants of Health) assessments performed: No   Distress Screen completed: No ONCBCN DISTRESS SCREENING 06/07/2020  Screening Type Initial Screening  Distress experienced in past week (1-10) 3  Practical problem type Insurance  Information Concerns Type Lack of info about diagnosis;Lack of info about treatment  Physical Problem type Pain;Loss of appetitie      Family/Social Information:  Housing Arrangement: patient lives with spouse Cody Vaughan  Family members/support persons in your life? Family Transportation concerns: no  Employment: Working part time. 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 yes Concerns about diagnosis and/or treatment: I'm not especially worried about anything Patient reported stressors: Work/ school  Current coping skills/ strengths: Capable of independent living , Scientist, research (life sciences) , Supportive family/friends , and Work skills     SUMMARY: Current SDOH Barriers:  Limited education about disability and medicaid, unrealistic expectations of physical abilities and continuation of work schedule before diagnosis*  Clinical Social Work Clinical Goal(s):  patient will follow up with [employer]* as directed by SW Patient is not amenable to applying for disability due to his believe that he  will improve and be abel to return to his former work schedule  . CSW will follow-up with patient in a few weeks and discuss possible outcomes of disease progression and how it may affect his ADLs and IADLs.  Interventions: Discussed common feeling and emotions when being diagnosed with cancer, and the importance of support during treatment Informed patient of the support team roles and support services at [facility] Provided CSW contact information and encouraged patient to call with any questions or concerns Referred patient to [facility] and Provided patient with information about [place] Counselor   Follow Up Plan: CSW will follow-up with patient by phone  Patient verbalizes understanding of plan: Yes    Adelene Amas , LCSW

## 2021-02-21 ENCOUNTER — Telehealth: Payer: Self-pay

## 2021-02-21 NOTE — Telephone Encounter (Signed)
-----   Message from Darl Pikes, [place] sent at 02/19/2021  1:05 PM EST ----- Because he does not have insurance and abiraterone no longer has assistance, we will have to do the darolutamide. We will get to work on getting him the darolutamide!  -Alyson  ----- Message ----- From: Earlie Server, MD Sent: 02/16/2021   9:20 PM EST To: Sheliah Hatch, CPhT, Evelina Dun, RN, #  [place] and Bethena Roys, please check if patient will need any assistant program for darolutamide or Abiateron.  Plan to add for metastatic prostate cancer.  He has no medical insurance.  Benjamine Mola and Amy, please send referral to social worker to help him to get coverage.

## 2021-02-21 NOTE — Telephone Encounter (Signed)
Referral for social work entered. Teresita Maxley and G[place]a [place] notified.

## 2021-02-23 NOTE — Telephone Encounter (Signed)
Oral Oncology Patient Advocate Encounter  [place] faxed the completed application for [employer] [place] Patient Assistance on 02/22/21 to assist patient in receiving Nubeqa at no out of pocket cost. Patient is uninsured.    Fax number: [phone]. Phone number: [phone]  Submitted application to [employer] to see if patient qualifies for a free trial of Nubeqa.  Application faxed to [phone] on 02/22/21.  I have received 3 faxes stating the form has been received and a benefits summary.  I will continue to update this encounter until final determination for both the free trial and PAP.  [place] Patient [place] Phone [phone] Fax [phone] 02/23/2021 11:04 AM

## 2021-03-07 NOTE — Telephone Encounter (Signed)
Called [employer] to check status of application.  Held for 2 hours to get a rep that said they were still processing the application and that they are overloaded with applications to process.  Ple[employer] [place] is still processing applications from October that were submitted. Rep stated that I could call back and check but they will send out determinations once they are done.  I will follow up with [place] on the status of patients free trial.  Cody Vaughan [place] Phone [phone] Fax [phone] 03/07/2021 12:11 PM

## 2021-03-12 NOTE — Telephone Encounter (Signed)
Oral Oncology Patient Advocate Encounter  Received notification from [employer] [place] [employer] that patient has been successfully enrolled into their program to receive Nubeqa from the manufacturer at $0 out of pocket until 03/09/2022.   Specialty Pharmacy that will dispense medication is [facility].  Patient knows to call the office with questions or concerns.   Oral Oncology Clinic will continue to follow.  [place] Patient [place] Phone [phone] Fax [phone] 03/12/2021 8:59 AM

## 2021-03-12 NOTE — Telephone Encounter (Signed)
Spoke to patients wife this morning and Cody Vaughan received his [name] free trial and started it on 03/04/21.  [place] Patient [place] Phone [phone] Fax [phone] 03/12/2021 11:26 AM

## 2021-03-20 ENCOUNTER — Inpatient Hospital Stay: Payer: Self-pay | Attending: Oncology | Admitting: Oncology

## 2021-03-20 ENCOUNTER — Inpatient Hospital Stay: Payer: Self-pay | Admitting: Pharmacist

## 2021-03-20 ENCOUNTER — Inpatient Hospital Stay: Payer: Self-pay

## 2021-03-20 ENCOUNTER — Encounter: Payer: Self-pay | Admitting: Oncology

## 2021-03-20 ENCOUNTER — Other Ambulatory Visit: Payer: Self-pay

## 2021-03-20 VITALS — BP 135/80 | HR 83 | Temp 96.5°F | Wt 187.3 lb

## 2021-03-20 DIAGNOSIS — Z79899 Other long term (current) drug therapy: Secondary | ICD-10-CM | POA: Insufficient documentation

## 2021-03-20 DIAGNOSIS — C7951 Secondary malignant neoplasm of bone: Secondary | ICD-10-CM | POA: Insufficient documentation

## 2021-03-20 DIAGNOSIS — Z5111 Encounter for antineoplastic chemotherapy: Secondary | ICD-10-CM

## 2021-03-20 DIAGNOSIS — Z95828 Presence of other vascular implants and grafts: Secondary | ICD-10-CM

## 2021-03-20 DIAGNOSIS — G4709 Other insomnia: Secondary | ICD-10-CM | POA: Insufficient documentation

## 2021-03-20 DIAGNOSIS — Z923 Personal history of irradiation: Secondary | ICD-10-CM | POA: Insufficient documentation

## 2021-03-20 DIAGNOSIS — F17210 Nicotine dependence, cigarettes, uncomplicated: Secondary | ICD-10-CM | POA: Insufficient documentation

## 2021-03-20 DIAGNOSIS — D649 Anemia, unspecified: Secondary | ICD-10-CM | POA: Insufficient documentation

## 2021-03-20 DIAGNOSIS — C61 Malignant neoplasm of prostate: Secondary | ICD-10-CM

## 2021-03-20 LAB — COMPREHENSIVE METABOLIC PANEL
ALT: 36 U/L (ref 0–44)
AST: 28 U/L (ref 15–41)
Albumin: 3.9 g/dL (ref 3.5–5.0)
Alkaline Phosphatase: 89 U/L (ref 38–126)
Anion gap: 9 (ref 5–15)
BUN: 19 mg/dL (ref 8–23)
CO2: 24 mmol/L (ref 22–32)
Calcium: 9.4 mg/dL (ref 8.9–10.3)
Chloride: 104 mmol/L (ref 98–111)
Creatinine, Ser: 0.79 mg/dL (ref 0.61–1.24)
GFR, Estimated: >60 mL/min (ref >60)
Glucose, Bld: 162 mg/dL — ABNORMAL HIGH (ref 70–99)
Potassium: 3.9 mmol/L (ref 3.5–5.1)
Sodium: 137 mmol/L (ref 135–145)
Total Bilirubin: 0.5 mg/dL (ref 0.3–1.2)
Total Protein: 7.3 g/dL (ref 6.5–8.1)

## 2021-03-20 LAB — CBC WITH DIFFERENTIAL/PLATELET
Abs Immature Granulocytes: 0.02 10*3/uL (ref 0.00–0.07)
Basophils Absolute: 0.1 10*3/uL (ref 0.0–0.1)
Basophils Relative: 1 %
Eosinophils Absolute: 0.5 10*3/uL (ref 0.0–0.5)
Eosinophils Relative: 8 %
HCT: 38 % — ABNORMAL LOW (ref 39.0–52.0)
Hemoglobin: 12.9 g/dL — ABNORMAL LOW (ref 13.0–17.0)
Immature Granulocytes: 0 %
Lymphocytes Relative: 34 %
Lymphs Abs: 2.1 10*3/uL (ref 0.7–4.0)
MCH: 28.9 pg (ref 26.0–34.0)
MCHC: 33.9 g/dL (ref 30.0–36.0)
MCV: 85.2 fL (ref 80.0–100.0)
Monocytes Absolute: 0.7 10*3/uL (ref 0.1–1.0)
Monocytes Relative: 11 %
Neutro Abs: 2.8 10*3/uL (ref 1.7–7.7)
Neutrophils Relative %: 46 %
Platelets: 200 10*3/uL (ref 150–400)
RBC: 4.46 MIL/uL (ref 4.22–5.81)
RDW: 15.1 % (ref 11.5–15.5)
WBC: 6.1 10*3/uL (ref 4.0–10.5)
nRBC: 0 % (ref 0.0–0.2)

## 2021-03-20 LAB — PSA: Prostatic Specific Antigen: <0.01 ng/mL (ref 0.00–4.00)

## 2021-03-20 MED ORDER — TRAZODONE HCL 50 MG PO TABS: 50.0000 mg | ORAL_TABLET | Freq: Every day | ORAL | 0 refills | Status: DC

## 2021-03-20 MED ORDER — DAROLUTAMIDE 300 MG PO TABS: 600.0000 mg | ORAL_TABLET | Freq: Two times a day (BID) | ORAL | 0 refills | Status: DC

## 2021-03-20 NOTE — Progress Notes (Signed)
Cody Vaughan  [phone] Fax:([phone]  Patient Care Team: Patient, No Pcp Per (Inactive) as PCP - General (General Practice)   Name of the patient: Cody Vaughan  [id number]  08/29/1960   Date of visit: 03/20/21  HPI: Patient is a 61 y.o. male with metastatic castration sensitive prostate cancer. Patient is s/p docetaxel, Nubeqa (darolutamide) is being added on to follow the ARASENS study. He started his darolutamide on 03/04/21  Reason for Consult: Oral chemotherapy follow-up for darolutamide therapy.   PAST MEDICAL HISTORY: Past Medical History:  Diagnosis Date   Prostate cancer [facility])    2022    HEMATOLOGY/ONCOLOGY HISTORY:  Oncology History  Prostate cancer ([place])  06/14/2020 Initial Diagnosis   Prostate cancer ([place])   06/23/2020 Cancer Staging   Staging form: Prostate, AJCC 8th Edition - Clinical stage from 06/23/2020: Stage IVB (cT2c, cNX, cM1b, PSA: 678, Grade Group: 5) - Signed by Earlie Server, MD on 06/23/2020 Prostate specific antigen (PSA) range: 20 or greater Histologic grading system: 5 grade system    07/17/2020 -  Chemotherapy    Patient is on Treatment Plan: PROSTATE DOCETAXEL Q21D         ALLERGIES:  has No Known Allergies.  MEDICATIONS:  Current Outpatient Medications  Medication Sig Dispense Refill   acetaminophen (TYLENOL) 325 MG tablet Take 650 mg by mouth every 6 (six) hours as needed for moderate pain or mild pain.     Calcium 600-400 MG-UNIT CHEW Chew 2 tablets by mouth daily. 60 tablet 3   lidocaine-prilocaine (EMLA) cream Apply to affected area once (Patient not taking: Reported on 02/16/2021) 30 g 3   meloxicam (MOBIC) 7.5 MG tablet Take 1 tablet (7.5 mg total) by mouth daily as needed for pain. (Patient not taking: Reported on 02/16/2021) 60 tablet 1   Multiple Vitamins-Minerals (MULTIVITAMIN WITH MINERALS) tablet Take 1 tablet by mouth daily.     nicotine (NICODERM CQ - DOSED IN MG/24  HOURS) 21 mg/24hr patch Place 1 patch (21 mg total) onto the skin daily. (Patient not taking: Reported on 02/16/2021) 28 patch 0   omeprazole (PRILOSEC) 20 MG capsule Take 1 capsule (20 mg total) by mouth daily. 30 capsule 1   traZODone (DESYREL) 50 MG tablet Take 1 tablet (50 mg total) by mouth at bedtime. 30 tablet 0   No current facility-administered medications for this visit.   Facility-Administered Medications Ordered in Other Visits  Medication Dose Route Frequency Provider Last Rate Last Admin   heparin lock flush 100 UNIT/ML injection             VITAL SIGNS: There were no vitals taken for this visit. There were no vitals filed for this visit.  Estimated body mass index is 26.12 kg/m as calculated from the following:   Height as of 07/10/20: 5\' 11"  (1.803 m).   Weight as of an earlier encounter on 03/20/21: 85 kg (187 lb 4.8 oz).  LABS: CBC:    Component Value Date/Time   WBC 6.1 03/20/2021 1313   HGB 12.9 (L) 03/20/2021 1313   HGB 12.6 (L) 09/07/2012 0431   HCT 38.0 (L) 03/20/2021 1313   HCT 36.5 (L) 09/07/2012 0431   PLT 200 03/20/2021 1313   PLT 288 09/07/2012 0431   MCV 85.2 03/20/2021 1313   MCV 89 09/07/2012 0431   NEUTROABS 2.8 03/20/2021 1313   NEUTROABS 6.2 09/07/2012 0431   LYMPHSABS 2.1 03/20/2021 1313   LYMPHSABS 1.8 09/07/2012 0431   MONOABS 0.7  03/20/2021 1313   MONOABS 1.6 (H) 09/07/2012 0431   EOSABS 0.5 03/20/2021 1313   EOSABS 0.3 09/07/2012 0431   BASOSABS 0.1 03/20/2021 1313   BASOSABS 0.1 09/07/2012 0431   Comprehensive Metabolic Panel:    Component Value Date/Time   NA 137 03/20/2021 1313   NA 142 09/06/2012 0429   K 3.9 03/20/2021 1313   K 3.5 09/06/2012 0429   CL 104 03/20/2021 1313   CL 106 09/06/2012 0429   CO2 24 03/20/2021 1313   CO2 28 09/06/2012 0429   BUN 19 03/20/2021 1313   BUN 9 09/06/2012 0429   CREATININE 0.79 03/20/2021 1313   CREATININE 0.97 09/06/2012 0429   GLUCOSE 162 (H) 03/20/2021 1313   GLUCOSE 105 (H)  09/06/2012 0429   CALCIUM 9.4 03/20/2021 1313   CALCIUM 8.5 09/06/2012 0429   AST 28 03/20/2021 1313   AST 20 09/02/2012 0658   ALT 36 03/20/2021 1313   ALT 43 09/02/2012 0658   ALKPHOS 89 03/20/2021 1313   ALKPHOS 71 09/02/2012 0658   BILITOT 0.5 03/20/2021 1313   BILITOT 1.0 09/02/2012 0658   PROT 7.3 03/20/2021 1313   PROT 5.9 (L) 09/02/2012 0658   ALBUMIN 3.9 03/20/2021 1313   ALBUMIN 2.6 (L) 09/02/2012 0658     Present during today's visit: patient and his wife  Assessment and Plan: Continue darolutamide 600mg  twice daily   Oral Chemotherapy Side Effect/Intolerance:  Fatigue: Patient reporting some fatigue and trouble sleeping since starting the darolutamide. [name] has prescribed trazodone for sleep. Patient runs a lawn care business and wants to make sure he has enough energy to work once busy season starts in spring time. No reported rash  Oral Chemotherapy Adherence: no missed dose reported, his wife lays out his medication for him No patient barriers to medication adherence identified.   New medications: none reported  Medication Access Issues: No issues patient enrolled in manufacturer assistance until 03/09/2022  Patient expressed understanding and was in agreement with this plan. He also understands that He can call clinic at any time with any questions, concerns, or complaints.   Follow-up plan: RTC in one month  Thank you for allowing me to participate in the care of this very pleasant patient.   Time Total: 15 mins  Visit consisted of counseling and education on dealing with issues of symptom management in the setting of serious and potentially life-threatening illness.Greater than 50%  of this time was spent counseling and coordinating care related to the above assessment and plan.  Signed by: Darl Pikes, PharmD, BCPS, Salley Slaughter, CPP Hematology/Oncology Clinical Pharmacist Practitioner Deputy/DB/AP [facility] [phone]  03/20/2021 4:40 PM

## 2021-03-20 NOTE — Progress Notes (Signed)
Patient is receiving Assistance Medication - Supplied Externally. Medication: Eligard Manufacture: [employer] Approval Dates: Approved from 03/16/2021 until 03/03/2022. Reason: Self Next DOS: June 2023  Madalyn Rob, CPhT IV Drug Replacement Specialist  [place] Phone: [phone]

## 2021-03-20 NOTE — Progress Notes (Signed)
Hematology/Oncology progress note [phone] Fax:(336) [phone]   Patient Care Team: Patient, No Pcp Per (Inactive) as PCP - General (General Practice)  REFERRING PROVIDER: No ref. provider found  CHIEF COMPLAINTS/REASON FOR VISIT:  Follow-up for prostate cancer treatment  HISTORY OF PRESENTING ILLNESS:   Cody Vaughan is a  61 y.o.  male with PMH listed below was seen in consultation at the request of  No ref. provider found  for evaluation of bone lesions 05/27/2020 patient presented to emergency room for evaluation of generalized weakness and and body pain.  Prior to the presentations, patient also had history of tooth abscess and was prescribed antibiotics. Reports acute on chronic right-sided lumbar pain without any prior trauma history.  Profound weakness.  Decreased appetite and loss of weight about 10 pounds during the past few months.  Also complained intermittent right upper quadrant discomfort  326 04/10/2020 CT abdomen pelvis showed a diffusely patchy sclerotic appearance of the osseous structures suspicious for osseous metastatic disease.  Prostate gland is heterogeneous in appearance.  Correlate with PSA.  Patient had a mild superior endplate compression deformity of L4 with less than 10% vertebral body height loss.  H indeterminate.  Fusiform infrarenal abdominal aortic aneurysm measuring up to 3.5 cm.  Follow-up in 2 years. Suspicious findings for early acute uncomplicated appendicitis.  Patient denies any right lower quadrant pain.  In the emergency room, he has a negative Murphy sign.  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  Highest Gleason score 5+4 Postprocedure, patient has had hematuria and hematochezia, bilateral lower extremity pain, AKI orthostatic hypovolemia, sepsis and was admitted and treated.  Patient was discharged on a course of antibiotics.  06/18/2020 MRI thoracic,  lumbar, sacrum is compatible with diffuse osseous metastasis in the visualized thoracic lumbar spine, sacrum and bilateral iliac bones.  No definitive dural based tumor is identified.  No compression fracture.  Spine spondylosis  06/20/2019, patient received loading dose of Firmagon 240 mg x 1. #06/22/2020, bone scan showed widespread osseous metastatic disease involving the appendicular and axial skeleton #April 2022. COVID infection. #07/10/2020, Dr. Lucky Cowboy placed Mediport #07/17/2020-10/30/2020 6 cycles of docetaxel.    #INTERVAL HISTORY Cody Vaughan is a 61 y.o. male who has above history reviewed by me today presents for follow up visit for management of metastatic prostate cancer, Patient is status post 6 cycles of docetaxel.  He has started darolutamide 2 weeks ago.  Overall he tolerates well.  Reports feeling fatigued.  Insomnia.  He takes nap during the day.  No nausea vomiting diarrhea.    Review of Systems  Constitutional:  Positive for fatigue. Negative for appetite change, chills, fever and unexpected weight change.       Patient walks independently  HENT:   Negative for hearing loss and voice change.   Eyes:  Negative for eye problems and icterus.  Respiratory:  Negative for chest tightness, cough and shortness of breath.   Cardiovascular:  Negative for chest pain and leg swelling.  Gastrointestinal:  Negative for abdominal distention and abdominal pain.  Endocrine: Positive for hot flashes.  Genitourinary:  Negative for difficulty urinating, dysuria and frequency.   Musculoskeletal:  Negative for arthralgias and back pain.  Skin:  Negative for itching and rash.  Neurological:  Negative for light-headedness and numbness.  Hematological:  Negative for adenopathy. Does not bruise/bleed easily.  Psychiatric/Behavioral:  Negative for confusion.    MEDICAL HISTORY:  Past Medical  History:  Diagnosis Date   Prostate cancer ([place])    2022    SURGICAL HISTORY: Past Surgical History:   Procedure Laterality Date   PORTA CATH INSERTION N/A 07/10/2020   Procedure: PORTA CATH INSERTION;  Surgeon: Algernon Huxley, MD;  Location: [facility];  Service: Cardiovascular;  Laterality: N/A;   PROSTATE BIOPSY N/A 06/16/2020   Procedure: PROSTATE BIOPSY;  Surgeon: Billey Co, MD;  Location: [facility];  Service: Urology;  Laterality: N/A;   SHOULDER SURGERY     1985   TRANSRECTAL ULTRASOUND N/A 06/16/2020   Procedure: TRANSRECTAL ULTRASOUND;  Surgeon: Billey Co, MD;  Location: [facility];  Service: Urology;  Laterality: N/A;    SOCIAL HISTORY: Social History   Socioeconomic History   Marital status: Married    Spouse name: Not on file   Number of children: Not on file   Years of education: Not on file   Highest education level: Not on file  Occupational History   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  Tobacco Use   Smoking status: Every Day    Packs/day: 0.75    Types: Cigarettes   Smokeless tobacco: Never  Substance and Sexual Activity   Alcohol use: Not Currently   Drug use: Never   Sexual activity: Not Currently  Other Topics Concern   Not on file  Social History Narrative   Not on file   Social Determinants of Health   Financial Resource Strain: Not on file  Food Insecurity: Not on file  Transportation Needs: Not on file  Physical Activity: Not on file  Stress: Not on file  Social Connections: Not on file  Intimate Partner Violence: Not on file    FAMILY HISTORY: Family History  Problem Relation Age of Onset   COPD Mother    Heart attack Father     ALLERGIES:  has No Known Allergies.  MEDICATIONS:  Current Outpatient Medications  Medication Sig Dispense Refill   acetaminophen (TYLENOL) 325 MG tablet Take 650 mg by mouth every 6 (six) hours as needed for moderate pain or mild pain.     Calcium 600-400 MG-UNIT CHEW Chew 2 tablets by mouth daily. 60 tablet 3   Multiple Vitamins-Minerals (MULTIVITAMIN  WITH MINERALS) tablet Take 1 tablet by mouth daily.     omeprazole (PRILOSEC) 20 MG capsule Take 1 capsule (20 mg total) by mouth daily. 30 capsule 1   traZODone (DESYREL) 50 MG tablet Take 1 tablet (50 mg total) by mouth at bedtime. 30 tablet 0   lidocaine-prilocaine (EMLA) cream Apply to affected area once (Patient not taking: Reported on 02/16/2021) 30 g 3   meloxicam (MOBIC) 7.5 MG tablet Take 1 tablet (7.5 mg total) by mouth daily as needed for pain. (Patient not taking: Reported on 02/16/2021) 60 tablet 1   nicotine (NICODERM CQ - DOSED IN MG/24 HOURS) 21 mg/24hr patch Place 1 patch (21 mg total) onto the skin daily. (Patient not taking: Reported on 02/16/2021) 28 patch 0   No current facility-administered medications for this visit.   Facility-Administered Medications Ordered in Other Visits  Medication Dose Route Frequency Provider Last Rate Last Admin   heparin lock flush 100 UNIT/ML injection              PHYSICAL EXAMINATION: ECOG PERFORMANCE STATUS: 0 - Asymptomatic Vitals:   03/20/21 1329  BP: 135/80  Pulse: 83  Temp: (!) 96.5 F (35.8 C)   Filed Weights  03/20/21 1329  Weight: 187 lb 4.8 oz (85 kg)    Physical Exam Constitutional:      General: He is not in acute distress. HENT:     Head: Normocephalic and atraumatic.  Eyes:     General: No scleral icterus. Cardiovascular:     Rate and Rhythm: Normal rate and regular rhythm.     Heart sounds: Normal heart sounds.  Pulmonary:     Effort: Pulmonary effort is normal. No respiratory distress.     Breath sounds: No wheezing.  Abdominal:     General: Bowel sounds are normal. There is no distension.     Palpations: Abdomen is soft.  Musculoskeletal:        General: No deformity. Normal range of motion.     Cervical back: Normal range of motion and neck supple.  Skin:    General: Skin is warm and dry.     Findings: No erythema or rash.  Neurological:     Mental Status: He is alert and oriented to person,  place, and time. Mental status is at baseline.     Cranial Nerves: No cranial nerve deficit.     Coordination: Coordination normal.  Psychiatric:        Mood and Affect: Mood normal.    LABORATORY DATA:  I have reviewed the data as listed Lab Results  Component Value Date   WBC 6.1 03/20/2021   HGB 12.9 (L) 03/20/2021   HCT 38.0 (L) 03/20/2021   MCV 85.2 03/20/2021   PLT 200 03/20/2021   Recent Labs    10/30/20 0833 02/16/21 1008 03/20/21 1313  NA 138 136 137  K 4.1 3.9 3.9  CL 106 104 104  CO2 23 24 24   GLUCOSE 185* 131* 162*  BUN 22 14 19   CREATININE 0.67 0.74 0.79  CALCIUM 9.1 9.2 9.4  GFRNONAA >60 >60 >60  PROT 6.4* 7.2 7.3  ALBUMIN 3.5 3.6 3.9  AST 27 32 28  ALT 39 46* 36  ALKPHOS 79 87 89  BILITOT 0.4 0.2* 0.5    Iron/TIBC/Ferritin/ %Sat    Component Value Date/Time   IRON 121 07/17/2020 1000   TIBC 384 07/17/2020 1000   FERRITIN 299 07/17/2020 1000   IRONPCTSAT 32 07/17/2020 1000      RADIOGRAPHIC STUDIES: I have personally reviewed the radiological images as listed and agreed with the findings in the report. No results found.     ASSESSMENT & PLAN:  1. Prostate cancer ([place])   2. Encounter for antineoplastic chemotherapy   3. Port-A-Cath in place   4. Bone metastasis ([place])   5. Other insomnia    Cancer Staging  Prostate cancer [facility]) Staging form: Prostate, AJCC 8th Edition - Clinical stage from 06/23/2020: Stage IVB (cT2c, cNX, cM1b, PSA: 678, Grade Group: 5) - Signed by Earlie Server, MD on 06/23/2020   #Metastatic prostate cancer-castration sensitive On  androgen deprivation therapy.--08/28/20 PSA has decreased to 0.01  Eligard 45mg  every 6 months.-02/16/2021. Patient is currently on darolutamide based on results fromARASENS trial, the combination of darolutamide plus docetaxel is an approved option for treatment of metastatic CSPC.  [Improved OS, time to develop castration resistant disease].  Labs reviewed and discussed with patient. Overall  he tolerates well. Continue current regimen.  #Anemia, hemoglobin has improved and remained stable. #Extensive bone metastasis, neoplasm related pain.  Improved. S/p radiation to hip.   #Port-A-Cath in place, recommend port flush every 8 weeks. #Insomnia, patient has taken over-the-counter melatonin with no improvement.  Recommend patient to try trazodone 50 mg at bedtime for insomnia.  Discussed with patient about lifestyle modification.  Supportive care measures are necessary for patient well-being and will be provided as necessary. We spent sufficient time to discuss many aspect of care, questions were answered to patient's satisfaction.   All questions were answered. The patient knows to call the clinic with any problems questions or concerns.  Return of visit: 2 months lab MD CBC CMP PSA   Earlie Server, MD, PhD  03/20/2021

## 2021-04-13 ENCOUNTER — Inpatient Hospital Stay (HOSPITAL_BASED_OUTPATIENT_CLINIC_OR_DEPARTMENT_OTHER): Payer: Self-pay | Admitting: Oncology

## 2021-04-13 ENCOUNTER — Inpatient Hospital Stay: Payer: Self-pay | Attending: Oncology

## 2021-04-13 ENCOUNTER — Other Ambulatory Visit: Payer: Self-pay

## 2021-04-13 ENCOUNTER — Encounter: Payer: Self-pay | Admitting: Oncology

## 2021-04-13 VITALS — BP 129/77 | HR 68 | Temp 97.9°F | Resp 18 | Wt 190.5 lb

## 2021-04-13 DIAGNOSIS — Z95828 Presence of other vascular implants and grafts: Secondary | ICD-10-CM

## 2021-04-13 DIAGNOSIS — M899 Disorder of bone, unspecified: Secondary | ICD-10-CM

## 2021-04-13 DIAGNOSIS — F17210 Nicotine dependence, cigarettes, uncomplicated: Secondary | ICD-10-CM | POA: Insufficient documentation

## 2021-04-13 DIAGNOSIS — C61 Malignant neoplasm of prostate: Secondary | ICD-10-CM | POA: Insufficient documentation

## 2021-04-13 DIAGNOSIS — Z79899 Other long term (current) drug therapy: Secondary | ICD-10-CM | POA: Insufficient documentation

## 2021-04-13 DIAGNOSIS — D649 Anemia, unspecified: Secondary | ICD-10-CM | POA: Insufficient documentation

## 2021-04-13 DIAGNOSIS — Z5111 Encounter for antineoplastic chemotherapy: Secondary | ICD-10-CM

## 2021-04-13 DIAGNOSIS — Z452 Encounter for adjustment and management of vascular access device: Secondary | ICD-10-CM | POA: Insufficient documentation

## 2021-04-13 DIAGNOSIS — Z923 Personal history of irradiation: Secondary | ICD-10-CM | POA: Insufficient documentation

## 2021-04-13 DIAGNOSIS — G4709 Other insomnia: Secondary | ICD-10-CM

## 2021-04-13 DIAGNOSIS — C7951 Secondary malignant neoplasm of bone: Secondary | ICD-10-CM | POA: Insufficient documentation

## 2021-04-13 LAB — CBC WITH DIFFERENTIAL/PLATELET
Abs Immature Granulocytes: 0.02 10*3/uL (ref 0.00–0.07)
Basophils Absolute: 0.1 10*3/uL (ref 0.0–0.1)
Basophils Relative: 1 %
Eosinophils Absolute: 0.3 10*3/uL (ref 0.0–0.5)
Eosinophils Relative: 4 %
HCT: 36.5 % — ABNORMAL LOW (ref 39.0–52.0)
Hemoglobin: 12.3 g/dL — ABNORMAL LOW (ref 13.0–17.0)
Immature Granulocytes: 0 %
Lymphocytes Relative: 29 %
Lymphs Abs: 1.9 10*3/uL (ref 0.7–4.0)
MCH: 29.8 pg (ref 26.0–34.0)
MCHC: 33.7 g/dL (ref 30.0–36.0)
MCV: 88.4 fL (ref 80.0–100.0)
Monocytes Absolute: 0.7 10*3/uL (ref 0.1–1.0)
Monocytes Relative: 11 %
Neutro Abs: 3.6 10*3/uL (ref 1.7–7.7)
Neutrophils Relative %: 55 %
Platelets: 238 10*3/uL (ref 150–400)
RBC: 4.13 MIL/uL — ABNORMAL LOW (ref 4.22–5.81)
RDW: 15.8 % — ABNORMAL HIGH (ref 11.5–15.5)
WBC: 6.6 10*3/uL (ref 4.0–10.5)
nRBC: 0.3 % — ABNORMAL HIGH (ref 0.0–0.2)

## 2021-04-13 LAB — COMPREHENSIVE METABOLIC PANEL
ALT: 39 U/L (ref 0–44)
AST: 32 U/L (ref 15–41)
Albumin: 3.7 g/dL (ref 3.5–5.0)
Alkaline Phosphatase: 83 U/L (ref 38–126)
Anion gap: 8 (ref 5–15)
BUN: 18 mg/dL (ref 8–23)
CO2: 25 mmol/L (ref 22–32)
Calcium: 9.2 mg/dL (ref 8.9–10.3)
Chloride: 103 mmol/L (ref 98–111)
Creatinine, Ser: 0.75 mg/dL (ref 0.61–1.24)
GFR, Estimated: >60 mL/min (ref >60)
Glucose, Bld: 116 mg/dL — ABNORMAL HIGH (ref 70–99)
Potassium: 4.1 mmol/L (ref 3.5–5.1)
Sodium: 136 mmol/L (ref 135–145)
Total Bilirubin: 0.2 mg/dL — ABNORMAL LOW (ref 0.3–1.2)
Total Protein: 7.3 g/dL (ref 6.5–8.1)

## 2021-04-13 LAB — PSA: Prostatic Specific Antigen: <0.01 ng/mL (ref 0.00–4.00)

## 2021-04-13 MED ORDER — HEPARIN SOD (PORK) LOCK FLUSH 100 UNIT/ML IV SOLN
500.0000 [IU] | Freq: Once | INTRAVENOUS | Status: AC
  Administered 2021-04-13: 500 [IU] via INTRAVENOUS
  Filled 2021-04-13: qty 5

## 2021-04-13 MED ORDER — SODIUM CHLORIDE 0.9% FLUSH
10.0000 mL | INTRAVENOUS | Status: DC | PRN
  Administered 2021-04-13: 10 mL via INTRAVENOUS
  Filled 2021-04-13: qty 10

## 2021-04-13 NOTE — Progress Notes (Signed)
Pt here for follow up. No new concerns voiced.   

## 2021-04-13 NOTE — Progress Notes (Signed)
Hematology/Oncology progress note [phone] [phone]   Patient Care Team: Patient, No Pcp Per (Inactive) as PCP - General (General Practice)  REFERRING PROVIDER: No ref. provider found  CHIEF COMPLAINTS/REASON FOR VISIT:  Follow-up for prostate cancer treatment  HISTORY OF PRESENTING ILLNESS:  05/27/2020 patient presented to emergency room for evaluation of generalized weakness and and body pain.  Prior to the presentations, patient also had history of tooth abscess and was prescribed antibiotics. Reports acute on chronic right-sided lumbar pain without any prior trauma history.  Profound weakness.  Decreased appetite and loss of weight about 10 pounds during the past few months.  Also complained intermittent right upper quadrant discomfort  326 04/10/2020 CT abdomen pelvis showed a diffusely patchy sclerotic appearance of the osseous structures suspicious for osseous metastatic disease.  Prostate gland is heterogeneous in appearance.  Correlate with PSA.  Patient had a mild superior endplate compression deformity of L4 with less than 10% vertebral body height loss.  H indeterminate.  Fusiform infrarenal abdominal aortic aneurysm measuring up to 3.5 cm.  Follow-up in 2 years. Suspicious findings for early acute uncomplicated appendicitis.  Patient denies any right lower quadrant pain.  In the emergency room, he has a negative Murphy sign.  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  Highest Gleason score 5+4 Postprocedure, patient has had hematuria and hematochezia, bilateral lower extremity pain, AKI orthostatic hypovolemia, sepsis and was admitted and treated.  Patient was discharged on a course of antibiotics.  06/18/2020 MRI thoracic, lumbar, sacrum is compatible with diffuse osseous metastasis in the visualized thoracic lumbar spine, sacrum and bilateral iliac bones.  No definitive dural based  tumor is identified.  No compression fracture.  Spine spondylosis  06/20/2019, patient received loading dose of Firmagon 240 mg x 1. #06/22/2020, bone scan showed widespread osseous metastatic disease involving the appendicular and axial skeleton #April 2022. COVID infection. #07/10/2020, Dr. Lucky Cowboy placed Mediport #07/17/2020-10/30/2020 6 cycles of docetaxel.   #03/06/2021, started on darolutamide  [ARASENS trial, the combination of darolutamide plus docetaxel is an approved option for treatment of metastatic CSPC- Improved OS, time to develop castration resistant disease].   #INTERVAL HISTORY Cody Vaughan is a 61 y.o. male who has above history reviewed by me today presents for follow up visit for management of metastatic prostate cancer, Patient continues on darolutamide 600 mg twice daily.  He tolerates well.  No nausea vomiting. Insomnia, partially improved with trazodone. No new complaints. +chronic back pain after work   Review of Systems  Constitutional:  Positive for fatigue. Negative for appetite change, chills, fever and unexpected weight change.       Patient walks independently  HENT:   Negative for hearing loss and voice change.   Eyes:  Negative for eye problems and icterus.  Respiratory:  Negative for chest tightness, cough and shortness of breath.   Cardiovascular:  Negative for chest pain and leg swelling.  Gastrointestinal:  Negative for abdominal distention and abdominal pain.  Endocrine: Positive for hot flashes.  Genitourinary:  Negative for difficulty urinating, dysuria and frequency.   Musculoskeletal:  Positive for back pain. Negative for arthralgias.  Skin:  Negative for itching and rash.  Neurological:  Negative for light-headedness and numbness.  Hematological:  Negative for adenopathy. Does not bruise/bleed easily.  Psychiatric/Behavioral:  Negative for confusion.    MEDICAL HISTORY:  Past Medical History:  Diagnosis Date   Prostate cancer ([place])    2022  SURGICAL HISTORY: Past Surgical History:  Procedure Laterality Date   PORTA CATH INSERTION N/A 07/10/2020   Procedure: PORTA CATH INSERTION;  Surgeon: Algernon Huxley, MD;  Location: [place] [facility];  Service: Cardiovascular;  Laterality: N/A;   PROSTATE BIOPSY N/A 06/16/2020   Procedure: PROSTATE BIOPSY;  Surgeon: Billey Co, MD;  Location: [facility];  Service: Urology;  Laterality: N/A;   SHOULDER SURGERY     1985   TRANSRECTAL ULTRASOUND N/A 06/16/2020   Procedure: TRANSRECTAL ULTRASOUND;  Surgeon: Billey Co, MD;  Location: [facility];  Service: Urology;  Laterality: N/A;    SOCIAL HISTORY: Social History   Socioeconomic History   Marital status: Married    Spouse name: Not on file   Number of children: Not on file   Years of education: Not on file   Highest education level: Not on file  Occupational History   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  Tobacco Use   Smoking status: Every Day    Packs/day: 0.75    Types: Cigarettes   Smokeless tobacco: Never  Substance and Sexual Activity   Alcohol use: Not Currently   Drug use: Never   Sexual activity: Not Currently  Other Topics Concern   Not on file  Social History Narrative   Not on file   Social Determinants of Health   Financial Resource Strain: Not on file  Food Insecurity: Not on file  Transportation Needs: Not on file  Physical Activity: Not on file  Stress: Not on file  Social Connections: Not on file  Intimate Partner Violence: Not on file    FAMILY HISTORY: Family History  Problem Relation Age of Onset   COPD Mother    Heart attack Father     ALLERGIES:  has No Known Allergies.  MEDICATIONS:  Current Outpatient Medications  Medication Sig Dispense Refill   acetaminophen (TYLENOL) 325 MG tablet Take 650 mg by mouth every 6 (six) hours as needed for moderate pain or mild pain.     Calcium 600-400 MG-UNIT CHEW Chew 2 tablets by mouth daily. 60 tablet 3    darolutamide (NUBEQA) 300 MG tablet Take 2 tablets (600 mg total) by mouth 2 (two) times daily with a meal. 120 tablet 0   Multiple Vitamins-Minerals (MULTIVITAMIN WITH MINERALS) tablet Take 1 tablet by mouth daily.     traZODone (DESYREL) 50 MG tablet Take 1 tablet (50 mg total) by mouth at bedtime. 30 tablet 0   lidocaine-prilocaine (EMLA) cream Apply to affected area once (Patient not taking: Reported on 02/16/2021) 30 g 3   meloxicam (MOBIC) 7.5 MG tablet Take 1 tablet (7.5 mg total) by mouth daily as needed for pain. (Patient not taking: Reported on 02/16/2021) 60 tablet 1   No current facility-administered medications for this visit.   Facility-Administered Medications Ordered in Other Visits  Medication Dose Route Frequency Provider Last Rate Last Admin   heparin lock flush 100 UNIT/ML injection              PHYSICAL EXAMINATION: ECOG PERFORMANCE STATUS: 0 - Asymptomatic Vitals:   04/13/21 1021  BP: 129/77  Pulse: 68  Resp: 18  Temp: 97.9 F (36.6 C)   Filed Weights   04/13/21 1021  Weight: 190 lb 8 oz (86.4 kg)    Physical Exam Constitutional:      General: He is not in acute distress. HENT:     Head: Normocephalic and atraumatic.  Eyes:  General: No scleral icterus. Cardiovascular:     Rate and Rhythm: Normal rate and regular rhythm.     Heart sounds: Normal heart sounds.  Pulmonary:     Effort: Pulmonary effort is normal. No respiratory distress.     Breath sounds: No wheezing.  Abdominal:     General: Bowel sounds are normal. There is no distension.     Palpations: Abdomen is soft.  Musculoskeletal:        General: No deformity. Normal range of motion.     Cervical back: Normal range of motion and neck supple.  Skin:    General: Skin is warm and dry.     Findings: No erythema or rash.  Neurological:     Mental Status: He is alert and oriented to person, place, and time. Mental status is at baseline.     Cranial Nerves: No cranial nerve deficit.      Coordination: Coordination normal.  Psychiatric:        Mood and Affect: Mood normal.    LABORATORY DATA:  I have reviewed the data as listed Lab Results  Component Value Date   WBC 6.6 04/13/2021   HGB 12.3 (L) 04/13/2021   HCT 36.5 (L) 04/13/2021   MCV 88.4 04/13/2021   PLT 238 04/13/2021   Recent Labs    02/16/21 1008 03/20/21 1313 04/13/21 1010  NA 136 137 136  K 3.9 3.9 4.1  CL 104 104 103  CO2 24 24 25   GLUCOSE 131* 162* 116*  BUN 14 19 18   CREATININE 0.74 0.79 0.75  CALCIUM 9.2 9.4 9.2  GFRNONAA >60 >60 >60  PROT 7.2 7.3 7.3  ALBUMIN 3.6 3.9 3.7  AST 32 28 32  ALT 46* 36 39  ALKPHOS 87 89 83  BILITOT 0.2* 0.5 0.2*    Iron/TIBC/Ferritin/ %Sat    Component Value Date/Time   IRON 121 07/17/2020 1000   TIBC 384 07/17/2020 1000   FERRITIN 299 07/17/2020 1000   IRONPCTSAT 32 07/17/2020 1000      RADIOGRAPHIC STUDIES: I have personally reviewed the radiological images as listed and agreed with the findings in the report. No results found.     ASSESSMENT & PLAN:  1. Prostate cancer ([place])   2. Encounter for antineoplastic chemotherapy   3. Port-A-Cath in place   4. Other insomnia   5. Bone lesion    Cancer Staging  Prostate cancer [facility]) Staging form: Prostate, AJCC 8th Edition - Clinical stage from 06/23/2020: Stage IVB (cT2c, cNX, cM1b, PSA: 678, Grade Group: 5) - Signed by Earlie Server, MD on 06/23/2020   #Metastatic prostate cancer-castration sensitive On  androgen deprivation therapy.--08/28/20 PSA has decreased to 0.01  Eligard 45mg  every 6 months.-02/16/2021. Darolutamide 600 mg twice daily. Tolerates well. Labs reviewed and discussed. Continue current regimen. Monitor PSA   #Anemia, hemoglobin is stable. #Extensive bone metastasis, neoplasm related pain.  Improved. S/p radiation to hip.   #Port-A-Cath in place, recommend port flush every 8 weeks.  #Insomnia,  trazodone 50 mg at bedtime for insomnia.   lifestyle  modification.  Supportive care measures are necessary for patient well-being and will be provided as necessary. We spent sufficient time to discuss many aspect of care, questions were answered to patient's satisfaction.   All questions were answered. The patient knows to call the clinic with any problems questions or concerns.  Return of visit: 2 months lab MD CBC CMP PSA   Earlie Server, MD, PhD  04/13/2021

## 2021-04-20 ENCOUNTER — Other Ambulatory Visit: Payer: Self-pay | Admitting: Oncology

## 2021-04-23 ENCOUNTER — Encounter: Payer: Self-pay | Admitting: Oncology

## 2021-05-15 ENCOUNTER — Other Ambulatory Visit: Payer: Self-pay

## 2021-05-17 ENCOUNTER — Ambulatory Visit: Payer: Self-pay | Admitting: Oncology

## 2021-06-08 ENCOUNTER — Inpatient Hospital Stay: Payer: Self-pay

## 2021-06-11 ENCOUNTER — Encounter: Payer: Self-pay | Admitting: Oncology

## 2021-06-11 ENCOUNTER — Other Ambulatory Visit: Payer: Self-pay

## 2021-06-11 ENCOUNTER — Inpatient Hospital Stay: Payer: Self-pay

## 2021-06-11 ENCOUNTER — Inpatient Hospital Stay: Payer: Self-pay | Attending: Oncology | Admitting: Oncology

## 2021-06-11 VITALS — BP 133/78 | HR 71 | Temp 96.7°F | Resp 16 | Ht 71.0 in | Wt 195.0 lb

## 2021-06-11 DIAGNOSIS — Z5111 Encounter for antineoplastic chemotherapy: Secondary | ICD-10-CM

## 2021-06-11 DIAGNOSIS — G4709 Other insomnia: Secondary | ICD-10-CM

## 2021-06-11 DIAGNOSIS — Z923 Personal history of irradiation: Secondary | ICD-10-CM | POA: Insufficient documentation

## 2021-06-11 DIAGNOSIS — G4700 Insomnia, unspecified: Secondary | ICD-10-CM | POA: Insufficient documentation

## 2021-06-11 DIAGNOSIS — Z95828 Presence of other vascular implants and grafts: Secondary | ICD-10-CM

## 2021-06-11 DIAGNOSIS — D649 Anemia, unspecified: Secondary | ICD-10-CM | POA: Insufficient documentation

## 2021-06-11 DIAGNOSIS — G893 Neoplasm related pain (acute) (chronic): Secondary | ICD-10-CM | POA: Insufficient documentation

## 2021-06-11 DIAGNOSIS — Z452 Encounter for adjustment and management of vascular access device: Secondary | ICD-10-CM | POA: Insufficient documentation

## 2021-06-11 DIAGNOSIS — Z79899 Other long term (current) drug therapy: Secondary | ICD-10-CM | POA: Insufficient documentation

## 2021-06-11 DIAGNOSIS — C61 Malignant neoplasm of prostate: Secondary | ICD-10-CM | POA: Insufficient documentation

## 2021-06-11 DIAGNOSIS — F17210 Nicotine dependence, cigarettes, uncomplicated: Secondary | ICD-10-CM | POA: Insufficient documentation

## 2021-06-11 DIAGNOSIS — C7951 Secondary malignant neoplasm of bone: Secondary | ICD-10-CM | POA: Insufficient documentation

## 2021-06-11 LAB — COMPREHENSIVE METABOLIC PANEL
ALT: 41 U/L (ref 0–44)
AST: 30 U/L (ref 15–41)
Albumin: 4 g/dL (ref 3.5–5.0)
Alkaline Phosphatase: 92 U/L (ref 38–126)
Anion gap: 6 (ref 5–15)
BUN: 17 mg/dL (ref 8–23)
CO2: 26 mmol/L (ref 22–32)
Calcium: 9.3 mg/dL (ref 8.9–10.3)
Chloride: 102 mmol/L (ref 98–111)
Creatinine, Ser: 0.78 mg/dL (ref 0.61–1.24)
GFR, Estimated: >60 mL/min (ref >60)
Glucose, Bld: 128 mg/dL — ABNORMAL HIGH (ref 70–99)
Potassium: 4 mmol/L (ref 3.5–5.1)
Sodium: 134 mmol/L — ABNORMAL LOW (ref 135–145)
Total Bilirubin: 0.3 mg/dL (ref 0.3–1.2)
Total Protein: 7.5 g/dL (ref 6.5–8.1)

## 2021-06-11 LAB — CBC WITH DIFFERENTIAL/PLATELET
Abs Immature Granulocytes: 0.02 10*3/uL (ref 0.00–0.07)
Basophils Absolute: 0.1 10*3/uL (ref 0.0–0.1)
Basophils Relative: 1 %
Eosinophils Absolute: 0.2 10*3/uL (ref 0.0–0.5)
Eosinophils Relative: 3 %
HCT: 39 % (ref 39.0–52.0)
Hemoglobin: 13.2 g/dL (ref 13.0–17.0)
Immature Granulocytes: 0 %
Lymphocytes Relative: 23 %
Lymphs Abs: 1.8 10*3/uL (ref 0.7–4.0)
MCH: 30.2 pg (ref 26.0–34.0)
MCHC: 33.8 g/dL (ref 30.0–36.0)
MCV: 89.2 fL (ref 80.0–100.0)
Monocytes Absolute: 0.6 10*3/uL (ref 0.1–1.0)
Monocytes Relative: 8 %
Neutro Abs: 5.1 10*3/uL (ref 1.7–7.7)
Neutrophils Relative %: 65 %
Platelets: 244 10*3/uL (ref 150–400)
RBC: 4.37 MIL/uL (ref 4.22–5.81)
RDW: 14.6 % (ref 11.5–15.5)
WBC: 7.8 10*3/uL (ref 4.0–10.5)
nRBC: 0 % (ref 0.0–0.2)

## 2021-06-11 LAB — PSA: Prostatic Specific Antigen: 0.01 ng/mL (ref 0.00–4.00)

## 2021-06-11 MED ORDER — HEPARIN SOD (PORK) LOCK FLUSH 100 UNIT/ML IV SOLN
500.0000 [IU] | Freq: Once | INTRAVENOUS | Status: AC
  Administered 2021-06-11: 500 [IU] via INTRAVENOUS
  Filled 2021-06-11: qty 5

## 2021-06-11 MED ORDER — SODIUM CHLORIDE 0.9% FLUSH
10.0000 mL | INTRAVENOUS | Status: DC | PRN
  Administered 2021-06-11: 10 mL via INTRAVENOUS
  Filled 2021-06-11: qty 10

## 2021-06-11 NOTE — Progress Notes (Signed)
?Hematology/Oncology progress note ?Telephone:(336) [phone] Fax:(336) [phone] ? ? ?Patient Care Team: ?Patient, No Pcp Per (Inactive) as PCP - General (General Practice) ? ?REFERRING PROVIDER: ?No ref. provider found  ?CHIEF COMPLAINTS/REASON FOR VISIT:  ?Follow-up for prostate cancer treatment ? ?HISTORY OF PRESENTING ILLNESS:  ?05/27/2020 patient presented to emergency room for evaluation of generalized weakness and and body pain.  Prior to the presentations, patient also had history of tooth abscess and was prescribed antibiotics. ?Reports acute on chronic right-sided lumbar pain without any prior trauma history.  Profound weakness.  Decreased appetite and loss of weight about 10 pounds during the past few months.  Also complained intermittent right upper quadrant discomfort ? ?326 04/10/2020 CT abdomen pelvis showed a diffusely patchy sclerotic appearance of the osseous structures suspicious for osseous metastatic disease.  Prostate gland is heterogeneous in appearance.  Correlate with PSA.  Patient had a mild superior endplate compression deformity of L4 with less than 10% vertebral body height loss.  H indeterminate.  Fusiform infrarenal abdominal aortic aneurysm measuring up to 3.5 cm.  Follow-up in 2 years. ?Suspicious findings for early acute uncomplicated appendicitis.  Patient denies any right lower quadrant pain.  In the emergency room, he has a negative Murphy sign. ? 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  Highest Gleason score 5+4 ?Postprocedure, patient has had hematuria and hematochezia, bilateral lower extremity pain, AKI orthostatic hypovolemia, sepsis and was admitted and treated.  Patient was discharged on a course of antibiotics. ? ?06/18/2020 MRI thoracic, lumbar, sacrum is compatible with diffuse osseous metastasis in the visualized thoracic lumbar spine, sacrum and bilateral iliac bones.  No definitive dural based  tumor is identified.  No compression fracture.  Spine spondylosis ? ?06/20/2019, patient received loading dose of Firmagon 240 mg x 1. ?#06/22/2020, bone scan showed widespread osseous metastatic disease involving the appendicular and axial skeleton ?#April 2022. COVID infection. ?#07/10/2020, Dr. Lucky Cowboy placed Mediport ?#07/17/2020-10/30/2020 6 cycles of docetaxel.   ?#03/06/2021, started on darolutamide  [ARASENS trial, the combination of darolutamide plus docetaxel is an approved option for treatment of metastatic CSPC- Improved OS, time to develop castration resistant disease].  ? ?#INTERVAL HISTORY ?Cody Vaughan is a 61 y.o. male who has above history reviewed by me today presents for follow up visit for management of metastatic prostate cancer, ?Patient continues on darolutamide 600 mg twice daily.  ?Patient tolerates well.  No nausea vomiting ?Marland Kitchen  He has no new complaints.  Chronic back pain after work, No change. ? ? ?Review of Systems  ?Constitutional:  Positive for fatigue. Negative for appetite change, chills, fever and unexpected weight change.  ?     Patient walks independently  ?HENT:   Negative for hearing loss and voice change.   ?Eyes:  Negative for eye problems and icterus.  ?Respiratory:  Negative for chest tightness, cough and shortness of breath.   ?Cardiovascular:  Negative for chest pain and leg swelling.  ?Gastrointestinal:  Negative for abdominal distention and abdominal pain.  ?Endocrine: Positive for hot flashes.  ?Genitourinary:  Negative for difficulty urinating, dysuria and frequency.   ?Musculoskeletal:  Positive for back pain. Negative for arthralgias.  ?Skin:  Negative for itching and rash.  ?Neurological:  Negative for light-headedness and numbness.  ?Hematological:  Negative for adenopathy. Does not bruise/bleed easily.  ?Psychiatric/Behavioral:  Negative for confusion.   ? ?MEDICAL HISTORY:  ?Past Medical History:  ?Diagnosis Date  ? Prostate cancer ([place])   ?  2022  ? ? ?SURGICAL  HISTORY: ?Past Surgical History:  ?Procedure Laterality Date  ? PORTA CATH INSERTION N/A 07/10/2020  ? Procedure: PORTA CATH INSERTION;  Surgeon: Algernon Huxley, MD;  Location: [place] [facility];  Service: Cardiovascular;  Laterality: N/A;  ? PROSTATE BIOPSY N/A 06/16/2020  ? Procedure: PROSTATE BIOPSY;  Surgeon: Billey Co, MD;  Location: [facility];  Service: Urology;  Laterality: N/A;  ? SHOULDER SURGERY    ? 1985  ? TRANSRECTAL ULTRASOUND N/A 06/16/2020  ? Procedure: TRANSRECTAL ULTRASOUND;  Surgeon: Billey Co, MD;  Location: [facility];  Service: Urology;  Laterality: N/A;  ? ? ?SOCIAL HISTORY: ?Social History  ? ?Socioeconomic History  ? Marital status: Married  ?  Spouse name: Not on file  ? Number of children: Not on file  ? Years of education: Not on file  ? Highest education level: Not on file  ?Occupational History  ? Occupation: land scaper  ?  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 Smoking status: Every Day  ?  Packs/day: 0.75  ?  Types: Cigarettes  ? Smokeless tobacco: Never  ?Substance and Sexual Activity  ? Alcohol use: Not Currently  ? Drug use: Never  ? Sexual activity: Not Currently  ?Other Topics Concern  ? Not on file  ?Social History Narrative  ? Not on file  ? ?Social Determinants of Health  ? ?Financial Resource Strain: Not on file  ?Food Insecurity: Not on file  ?Transportation Needs: Not on file  ?Physical Activity: Not on file  ?Stress: Not on file  ?Social Connections: Not on file  ?Intimate Partner Violence: Not on file  ? ? ?FAMILY HISTORY: ?Family History  ?Problem Relation Age of Onset  ? COPD Mother   ? Heart attack Father   ? ? ?ALLERGIES:  has No Known Allergies. ? ?MEDICATIONS:  ?Current Outpatient Medications  ?Medication Sig Dispense Refill  ? acetaminophen (TYLENOL) 325 MG tablet Take 650 mg by mouth every 6 (six) hours as needed for moderate pain or mild pain.    ? Calcium 600-400 MG-UNIT CHEW Chew 2 tablets by mouth daily. 60 tablet 3  ?  darolutamide (NUBEQA) 300 MG tablet Take 2 tablets (600 mg total) by mouth 2 (two) times daily with a meal. 120 tablet 0  ? Multiple Vitamins-Minerals (MULTIVITAMIN WITH MINERALS) tablet Take 1 tablet by mouth daily.    ? lidocaine-prilocaine (EMLA) cream Apply to affected area once (Patient not taking: Reported on 02/16/2021) 30 g 3  ? meloxicam (MOBIC) 7.5 MG tablet Take 1 tablet (7.5 mg total) by mouth daily as needed for pain. (Patient not taking: Reported on 02/16/2021) 60 tablet 1  ? traZODone (DESYREL) 50 MG tablet TAKE 1 TABLET BY MOUTH AT BEDTIME (Patient not taking: Reported on 06/11/2021) 30 tablet 0  ? ?No current facility-administered medications for this visit.  ? ?Facility-Administered Medications Ordered in Other Visits  ?Medication Dose Route Frequency Provider Last Rate Last Admin  ? heparin lock flush 100 UNIT/ML injection           ? ? ? ?PHYSICAL EXAMINATION: ?ECOG PERFORMANCE STATUS: 0 - Asymptomatic ?Vitals:  ? 06/11/21 1003  ?BP: 133/78  ?Pulse: 71  ?Resp: 16  ?Temp: (!) 96.7 ?F (35.9 ?C)  ?SpO2: 98%  ? ?Filed Weights  ? 06/11/21 1003  ?Weight: 195 lb (88.5 kg)  ? ? ?Physical Exam ?Constitutional:   ?   General: He is not in acute distress. ?HENT:  ?  Head: Normocephalic and atraumatic.  ?Eyes:  ?   General: No scleral icterus. ?Cardiovascular:  ?   Rate and Rhythm: Normal rate and regular rhythm.  ?   Heart sounds: Normal heart sounds.  ?Pulmonary:  ?   Effort: Pulmonary effort is normal. No respiratory distress.  ?   Breath sounds: No wheezing.  ?Abdominal:  ?   General: Bowel sounds are normal. There is no distension.  ?   Palpations: Abdomen is soft.  ?Musculoskeletal:     ?   General: No deformity. Normal range of motion.  ?   Cervical back: Normal range of motion and neck supple.  ?Skin: ?   General: Skin is warm and dry.  ?   Findings: No erythema or rash.  ?Neurological:  ?   Mental Status: He is alert and oriented to person, place, and time. Mental status is at baseline.  ?   Cranial  Nerves: No cranial nerve deficit.  ?   Coordination: Coordination normal.  ?Psychiatric:     ?   Mood and Affect: Mood normal.  ? ? ?LABORATORY DATA:  ?I have reviewed the data as listed ?Lab Results  ?Component Value Date

## 2021-07-13 ENCOUNTER — Inpatient Hospital Stay: Payer: Self-pay

## 2021-08-02 ENCOUNTER — Other Ambulatory Visit: Payer: Self-pay | Admitting: Pharmacist

## 2021-08-02 ENCOUNTER — Other Ambulatory Visit: Payer: Self-pay

## 2021-08-02 DIAGNOSIS — C61 Malignant neoplasm of prostate: Secondary | ICD-10-CM

## 2021-08-02 MED ORDER — DAROLUTAMIDE 300 MG PO TABS: 600.0000 mg | ORAL_TABLET | Freq: Two times a day (BID) | ORAL | 3 refills | Status: DC

## 2021-08-03 ENCOUNTER — Encounter: Payer: Self-pay | Admitting: Oncology

## 2021-08-03 ENCOUNTER — Inpatient Hospital Stay: Payer: Self-pay

## 2021-08-14 ENCOUNTER — Inpatient Hospital Stay: Payer: Self-pay

## 2021-08-14 ENCOUNTER — Inpatient Hospital Stay: Payer: Self-pay | Attending: Oncology

## 2021-08-14 ENCOUNTER — Encounter: Payer: Self-pay | Admitting: Oncology

## 2021-08-14 ENCOUNTER — Inpatient Hospital Stay (HOSPITAL_BASED_OUTPATIENT_CLINIC_OR_DEPARTMENT_OTHER): Payer: Self-pay | Admitting: Oncology

## 2021-08-14 VITALS — BP 133/89 | HR 62 | Temp 97.6°F | Resp 18 | Wt 191.5 lb

## 2021-08-14 DIAGNOSIS — C61 Malignant neoplasm of prostate: Secondary | ICD-10-CM

## 2021-08-14 DIAGNOSIS — Z95828 Presence of other vascular implants and grafts: Secondary | ICD-10-CM

## 2021-08-14 DIAGNOSIS — Z5111 Encounter for antineoplastic chemotherapy: Secondary | ICD-10-CM

## 2021-08-14 DIAGNOSIS — C7951 Secondary malignant neoplasm of bone: Secondary | ICD-10-CM | POA: Insufficient documentation

## 2021-08-14 DIAGNOSIS — F17210 Nicotine dependence, cigarettes, uncomplicated: Secondary | ICD-10-CM | POA: Insufficient documentation

## 2021-08-14 LAB — CBC WITH DIFFERENTIAL/PLATELET
Abs Immature Granulocytes: 0.01 10*3/uL (ref 0.00–0.07)
Basophils Absolute: 0.1 10*3/uL (ref 0.0–0.1)
Basophils Relative: 1 %
Eosinophils Absolute: 0.2 10*3/uL (ref 0.0–0.5)
Eosinophils Relative: 3 %
HCT: 37.5 % — ABNORMAL LOW (ref 39.0–52.0)
Hemoglobin: 12.7 g/dL — ABNORMAL LOW (ref 13.0–17.0)
Immature Granulocytes: 0 %
Lymphocytes Relative: 32 %
Lymphs Abs: 2.1 10*3/uL (ref 0.7–4.0)
MCH: 30.4 pg (ref 26.0–34.0)
MCHC: 33.9 g/dL (ref 30.0–36.0)
MCV: 89.7 fL (ref 80.0–100.0)
Monocytes Absolute: 0.7 10*3/uL (ref 0.1–1.0)
Monocytes Relative: 11 %
Neutro Abs: 3.3 10*3/uL (ref 1.7–7.7)
Neutrophils Relative %: 53 %
Platelets: 218 10*3/uL (ref 150–400)
RBC: 4.18 MIL/uL — ABNORMAL LOW (ref 4.22–5.81)
RDW: 13.9 % (ref 11.5–15.5)
WBC: 6.3 10*3/uL (ref 4.0–10.5)
nRBC: 0 % (ref 0.0–0.2)

## 2021-08-14 LAB — COMPREHENSIVE METABOLIC PANEL
ALT: 41 U/L (ref 0–44)
AST: 30 U/L (ref 15–41)
Albumin: 3.9 g/dL (ref 3.5–5.0)
Alkaline Phosphatase: 94 U/L (ref 38–126)
Anion gap: 8 (ref 5–15)
BUN: 12 mg/dL (ref 8–23)
CO2: 24 mmol/L (ref 22–32)
Calcium: 8.9 mg/dL (ref 8.9–10.3)
Chloride: 102 mmol/L (ref 98–111)
Creatinine, Ser: 0.81 mg/dL (ref 0.61–1.24)
GFR, Estimated: >60 mL/min (ref >60)
Glucose, Bld: 112 mg/dL — ABNORMAL HIGH (ref 70–99)
Potassium: 3.8 mmol/L (ref 3.5–5.1)
Sodium: 134 mmol/L — ABNORMAL LOW (ref 135–145)
Total Bilirubin: 0.6 mg/dL (ref 0.3–1.2)
Total Protein: 7.6 g/dL (ref 6.5–8.1)

## 2021-08-14 LAB — PSA: Prostatic Specific Antigen: 0.01 ng/mL (ref 0.00–4.00)

## 2021-08-14 MED ORDER — HEPARIN SOD (PORK) LOCK FLUSH 100 UNIT/ML IV SOLN
500.0000 [IU] | Freq: Once | INTRAVENOUS | Status: AC
  Administered 2021-08-14: 500 [IU] via INTRAVENOUS
  Filled 2021-08-14: qty 5

## 2021-08-14 MED ORDER — SODIUM CHLORIDE 0.9% FLUSH
10.0000 mL | Freq: Once | INTRAVENOUS | Status: AC
  Administered 2021-08-14: 10 mL via INTRAVENOUS
  Filled 2021-08-14: qty 10

## 2021-08-14 MED ORDER — LEUPROLIDE ACETATE (6 MONTH) 45 MG ~~LOC~~ KIT
45.0000 mg | PACK | Freq: Once | SUBCUTANEOUS | Status: AC
  Administered 2021-08-14: 45 mg via SUBCUTANEOUS
  Filled 2021-08-14: qty 45

## 2021-08-14 NOTE — Assessment & Plan Note (Signed)
Discussed with patient

## 2021-08-14 NOTE — Progress Notes (Signed)
Hematology/Oncology Progress note Telephone:(336) [phone] Fax:(336) [phone]      Patient Care Team: Patient, No Pcp Per (Inactive) as PCP - General (General Practice)  ASSESSMENT & PLAN:  Prostate cancer ([place]) Labs reviewed and discussed with patient.   PSA has been stable. Continue androgen deprivation therapy-Eligard 45 mg every 6 months.-Proceed with Eligard today Continue darolutamide 600 mg twice daily  Goals of care, counseling/discussion Discussed with patient  Port-A-Cath in place Continue port flush every 8 weeks  Encounter for antineoplastic chemotherapy Chemotherapy plan as above  Orders Placed This Encounter  Procedures   CBC with Differential/Platelet    Standing Status:   Future    Standing Expiration Date:   08/15/2022   Comprehensive metabolic panel    Standing Status:   Future    Standing Expiration Date:   08/14/2022   PSA    Standing Status:   Future    Standing Expiration Date:   08/15/2022   Follow up in 3 months.  All questions were answered. The patient knows to call the clinic with any problems, questions or concerns.  Earlie Server, MD, PhD [facility] Hematology Oncology 08/14/2021   CHIEF COMPLAINTS/REASON FOR VISIT:  Follow-up for prostate cancer treatment  HISTORY OF PRESENTING ILLNESS:  05/27/2020 patient presented to emergency room for evaluation of generalized weakness and and body pain.  Prior to the presentations, patient also had history of tooth abscess and was prescribed antibiotics. Reports acute on chronic right-sided lumbar pain without any prior trauma history.  Profound weakness.  Decreased appetite and loss of weight about 10 pounds during the past few months.  Also complained intermittent right upper quadrant discomfort  326 04/10/2020 CT abdomen pelvis showed a diffusely patchy sclerotic appearance of the osseous structures suspicious for osseous metastatic disease.  Prostate gland is heterogeneous in appearance.  Correlate with  PSA.  Patient had a mild superior endplate compression deformity of L4 with less than 10% vertebral body height loss.  H indeterminate.  Fusiform infrarenal abdominal aortic aneurysm measuring up to 3.5 cm.  Follow-up in 2 years. Suspicious findings for early acute uncomplicated appendicitis.  Patient denies any right lower quadrant pain.  In the emergency room, he has a negative Murphy sign.  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  Highest Gleason score 5+4 Postprocedure, patient has had hematuria and hematochezia, bilateral lower extremity pain, AKI orthostatic hypovolemia, sepsis and was admitted and treated.  Patient was discharged on a course of antibiotics.  06/18/2020 MRI thoracic, lumbar, sacrum is compatible with diffuse osseous metastasis in the visualized thoracic lumbar spine, sacrum and bilateral iliac bones.  No definitive dural based tumor is identified.  No compression fracture.  Spine spondylosis  06/20/2019, patient received loading dose of Firmagon 240 mg x 1. #06/22/2020, bone scan showed widespread osseous metastatic disease involving the appendicular and axial skeleton #April 2022. COVID infection. #07/10/2020, Dr. Lucky Cowboy placed Mediport #07/17/2020-10/30/2020 6 cycles of docetaxel.   #03/06/2021, started on darolutamide  [ARASENS trial, the combination of darolutamide plus docetaxel is an approved option for treatment of metastatic CSPC- Improved OS, time to develop castration resistant disease].   #INTERVAL HISTORY SHI BLANKENSHIP is a 61 y.o. male who has above history reviewed by me today presents for follow up visit for management of metastatic prostate cancer, Patient continues on darolutamide 600 mg twice daily.  Chronic back pain after work. Patient tolerates treatments, denies any new complaints today.  Review of Systems  Constitutional:  Positive for fatigue. Negative for appetite change, chills,  fever and unexpected weight change.       Patient walks independently  HENT:   Negative for hearing loss and voice change.   Eyes:  Negative for eye problems and icterus.  Respiratory:  Negative for chest tightness, cough and shortness of breath.   Cardiovascular:  Negative for chest pain and leg swelling.  Gastrointestinal:  Negative for abdominal distention and abdominal pain.  Endocrine: Positive for hot flashes.  Genitourinary:  Negative for difficulty urinating, dysuria and frequency.   Musculoskeletal:  Positive for back pain. Negative for arthralgias.  Skin:  Negative for itching and rash.  Neurological:  Negative for light-headedness and numbness.  Hematological:  Negative for adenopathy. Does not bruise/bleed easily.  Psychiatric/Behavioral:  Negative for confusion.     MEDICAL HISTORY:  Past Medical History:  Diagnosis Date   Prostate cancer ([place])    2022    SURGICAL HISTORY: Past Surgical History:  Procedure Laterality Date   PORTA CATH INSERTION N/A 07/10/2020   Procedure: PORTA CATH INSERTION;  Surgeon: Algernon Huxley, MD;  Location: [place] [facility];  Service: Cardiovascular;  Laterality: N/A;   PROSTATE BIOPSY N/A 06/16/2020   Procedure: PROSTATE BIOPSY;  Surgeon: Billey Co, MD;  Location: [facility];  Service: Urology;  Laterality: N/A;   SHOULDER SURGERY     1985   TRANSRECTAL ULTRASOUND N/A 06/16/2020   Procedure: TRANSRECTAL ULTRASOUND;  Surgeon: Billey Co, MD;  Location: [facility];  Service: Urology;  Laterality: N/A;    SOCIAL HISTORY: Social History   Socioeconomic History   Marital status: Married    Spouse name: Not on file   Number of children: Not on file   Years of education: Not on file   Highest education level: Not on file  Occupational History   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  Tobacco Use   Smoking status: Every Day    Packs/day: 0.75    Types: Cigarettes   Smokeless tobacco: Never   Substance and Sexual Activity   Alcohol use: Not Currently   Drug use: Never   Sexual activity: Not Currently  Other Topics Concern   Not on file  Social History Narrative   Not on file   Social Determinants of Health   Financial Resource Strain: Not on file  Food Insecurity: Not on file  Transportation Needs: Not on file  Physical Activity: Not on file  Stress: Not on file  Social Connections: Not on file  Intimate Partner Violence: Not on file    FAMILY HISTORY: Family History  Problem Relation Age of Onset   COPD Mother    Heart attack Father     ALLERGIES:  has No Known Allergies.  MEDICATIONS:  Current Outpatient Medications  Medication Sig Dispense Refill   acetaminophen (TYLENOL) 325 MG tablet Take 650 mg by mouth every 6 (six) hours as needed for moderate pain or mild pain.     Calcium 600-400 MG-UNIT CHEW Chew 2 tablets by mouth daily. 60 tablet 3   darolutamide (NUBEQA) 300 MG tablet Take 2 tablets (600 mg total) by mouth 2 (two) times daily with a meal. 120 tablet 3   Multiple Vitamins-Minerals (MULTIVITAMIN WITH MINERALS) tablet Take 1 tablet by mouth daily.     traZODone (DESYREL) 50 MG tablet TAKE 1 TABLET BY MOUTH AT BEDTIME (Patient not taking: Reported on 06/11/2021) 30 tablet 0  No current facility-administered medications for this visit.   Facility-Administered Medications Ordered in Other Visits  Medication Dose Route Frequency Provider Last Rate Last Admin   heparin lock flush 100 UNIT/ML injection              PHYSICAL EXAMINATION: ECOG PERFORMANCE STATUS: 0 - Asymptomatic Vitals:   08/14/21 1335  BP: 133/89  Pulse: 62  Resp: 18  Temp: 97.6 F (36.4 C)   Filed Weights   08/14/21 1335  Weight: 191 lb 8 oz (86.9 kg)    Physical Exam Constitutional:      General: He is not in acute distress. HENT:     Head: Normocephalic and atraumatic.  Eyes:     General: No scleral icterus. Cardiovascular:     Rate and Rhythm: Normal rate and  regular rhythm.     Heart sounds: Normal heart sounds.  Pulmonary:     Effort: Pulmonary effort is normal. No respiratory distress.     Breath sounds: No wheezing.  Abdominal:     General: Bowel sounds are normal. There is no distension.     Palpations: Abdomen is soft.  Musculoskeletal:        General: No deformity. Normal range of motion.     Cervical back: Normal range of motion and neck supple.  Skin:    General: Skin is warm and dry.     Findings: No erythema or rash.  Neurological:     Mental Status: He is alert and oriented to person, place, and time. Mental status is at baseline.     Cranial Nerves: No cranial nerve deficit.     Coordination: Coordination normal.  Psychiatric:        Mood and Affect: Mood normal.     LABORATORY DATA:  I have reviewed the data as listed Lab Results  Component Value Date   WBC 6.3 08/14/2021   HGB 12.7 (L) 08/14/2021   HCT 37.5 (L) 08/14/2021   MCV 89.7 08/14/2021   PLT 218 08/14/2021   Recent Labs    03/20/21 1313 04/13/21 1010 06/11/21 0935  NA 137 136 134*  K 3.9 4.1 4.0  CL 104 103 102  CO2 '24 25 26  '$ GLUCOSE 162* 116* 128*  BUN '19 18 17  '$ CREATININE 0.79 0.75 0.78  CALCIUM 9.4 9.2 9.3  GFRNONAA >60 >60 >60  PROT 7.3 7.3 7.5  ALBUMIN 3.9 3.7 4.0  AST 28 32 30  ALT 36 39 41  ALKPHOS 89 83 92  BILITOT 0.5 0.2* 0.3    Iron/TIBC/Ferritin/ %Sat    Component Value Date/Time   IRON 121 07/17/2020 1000   TIBC 384 07/17/2020 1000   FERRITIN 299 07/17/2020 1000   IRONPCTSAT 32 07/17/2020 1000      RADIOGRAPHIC STUDIES: I have personally reviewed the radiological images as listed and agreed with the findings in the report. No results found.

## 2021-08-14 NOTE — Assessment & Plan Note (Signed)
Chemotherapy plan as above 

## 2021-08-14 NOTE — Assessment & Plan Note (Signed)
Labs reviewed and discussed with patient.   PSA has been stable. Continue androgen deprivation therapy-Eligard 45 mg every 6 months.-Proceed with Eligard today Continue darolutamide 600 mg twice daily

## 2021-08-14 NOTE — Assessment & Plan Note (Signed)
Continue port flush every 8 weeks. 

## 2021-08-18 ENCOUNTER — Other Ambulatory Visit: Payer: Self-pay | Admitting: Nurse Practitioner

## 2021-09-07 ENCOUNTER — Inpatient Hospital Stay: Payer: Self-pay

## 2021-09-28 ENCOUNTER — Inpatient Hospital Stay: Payer: Self-pay

## 2021-11-01 ENCOUNTER — Other Ambulatory Visit (HOSPITAL_COMMUNITY): Payer: Self-pay

## 2021-11-02 ENCOUNTER — Inpatient Hospital Stay: Payer: Self-pay

## 2021-11-15 ENCOUNTER — Inpatient Hospital Stay (HOSPITAL_BASED_OUTPATIENT_CLINIC_OR_DEPARTMENT_OTHER): Payer: Self-pay | Admitting: Oncology

## 2021-11-15 ENCOUNTER — Inpatient Hospital Stay: Payer: Self-pay | Attending: Oncology

## 2021-11-15 ENCOUNTER — Encounter: Payer: Self-pay | Admitting: Oncology

## 2021-11-15 VITALS — BP 136/78 | HR 51 | Temp 97.8°F | Resp 20 | Wt 194.0 lb

## 2021-11-15 DIAGNOSIS — F17210 Nicotine dependence, cigarettes, uncomplicated: Secondary | ICD-10-CM | POA: Insufficient documentation

## 2021-11-15 DIAGNOSIS — C7951 Secondary malignant neoplasm of bone: Secondary | ICD-10-CM | POA: Insufficient documentation

## 2021-11-15 DIAGNOSIS — Z95828 Presence of other vascular implants and grafts: Secondary | ICD-10-CM

## 2021-11-15 DIAGNOSIS — Z79899 Other long term (current) drug therapy: Secondary | ICD-10-CM | POA: Insufficient documentation

## 2021-11-15 DIAGNOSIS — C61 Malignant neoplasm of prostate: Secondary | ICD-10-CM

## 2021-11-15 DIAGNOSIS — Z720 Tobacco use: Secondary | ICD-10-CM | POA: Insufficient documentation

## 2021-11-15 DIAGNOSIS — Z5111 Encounter for antineoplastic chemotherapy: Secondary | ICD-10-CM

## 2021-11-15 DIAGNOSIS — R232 Flushing: Secondary | ICD-10-CM | POA: Insufficient documentation

## 2021-11-15 DIAGNOSIS — F17200 Nicotine dependence, unspecified, uncomplicated: Secondary | ICD-10-CM

## 2021-11-15 LAB — COMPREHENSIVE METABOLIC PANEL
ALT: 45 U/L — ABNORMAL HIGH (ref 0–44)
AST: 34 U/L (ref 15–41)
Albumin: 4 g/dL (ref 3.5–5.0)
Alkaline Phosphatase: 99 U/L (ref 38–126)
Anion gap: 4 — ABNORMAL LOW (ref 5–15)
BUN: 14 mg/dL (ref 8–23)
CO2: 26 mmol/L (ref 22–32)
Calcium: 9.2 mg/dL (ref 8.9–10.3)
Chloride: 106 mmol/L (ref 98–111)
Creatinine, Ser: 0.76 mg/dL (ref 0.61–1.24)
GFR, Estimated: >60 mL/min (ref >60)
Glucose, Bld: 113 mg/dL — ABNORMAL HIGH (ref 70–99)
Potassium: 4.2 mmol/L (ref 3.5–5.1)
Sodium: 136 mmol/L (ref 135–145)
Total Bilirubin: 0.5 mg/dL (ref 0.3–1.2)
Total Protein: 7.6 g/dL (ref 6.5–8.1)

## 2021-11-15 LAB — CBC WITH DIFFERENTIAL/PLATELET
Abs Immature Granulocytes: 0.02 10*3/uL (ref 0.00–0.07)
Basophils Absolute: 0.1 10*3/uL (ref 0.0–0.1)
Basophils Relative: 1 %
Eosinophils Absolute: 0.2 10*3/uL (ref 0.0–0.5)
Eosinophils Relative: 3 %
HCT: 39.3 % (ref 39.0–52.0)
Hemoglobin: 13.4 g/dL (ref 13.0–17.0)
Immature Granulocytes: 0 %
Lymphocytes Relative: 31 %
Lymphs Abs: 1.9 10*3/uL (ref 0.7–4.0)
MCH: 30.6 pg (ref 26.0–34.0)
MCHC: 34.1 g/dL (ref 30.0–36.0)
MCV: 89.7 fL (ref 80.0–100.0)
Monocytes Absolute: 0.6 10*3/uL (ref 0.1–1.0)
Monocytes Relative: 9 %
Neutro Abs: 3.5 10*3/uL (ref 1.7–7.7)
Neutrophils Relative %: 56 %
Platelets: 245 10*3/uL (ref 150–400)
RBC: 4.38 MIL/uL (ref 4.22–5.81)
RDW: 13.8 % (ref 11.5–15.5)
WBC: 6.3 10*3/uL (ref 4.0–10.5)
nRBC: 0 % (ref 0.0–0.2)

## 2021-11-15 LAB — PSA: Prostatic Specific Antigen: 0.08 ng/mL (ref 0.00–4.00)

## 2021-11-15 MED ORDER — HEPARIN SOD (PORK) LOCK FLUSH 100 UNIT/ML IV SOLN
500.0000 [IU] | Freq: Once | INTRAVENOUS | Status: AC
  Administered 2021-11-15: 500 [IU] via INTRAVENOUS
  Filled 2021-11-15: qty 5

## 2021-11-15 MED ORDER — DAROLUTAMIDE 300 MG PO TABS: 600.0000 mg | ORAL_TABLET | Freq: Two times a day (BID) | ORAL | 3 refills | Status: DC

## 2021-11-15 MED ORDER — NICOTINE 14 MG/24HR TD PT24: 14.0000 mg | MEDICATED_PATCH | Freq: Every day | TRANSDERMAL | 2 refills | Status: DC

## 2021-11-15 MED ORDER — SODIUM CHLORIDE 0.9% FLUSH
10.0000 mL | Freq: Once | INTRAVENOUS | Status: AC
  Administered 2021-11-15: 10 mL via INTRAVENOUS
  Filled 2021-11-15: qty 10

## 2021-11-15 MED ORDER — VENLAFAXINE HCL 37.5 MG PO TABS: 37.5000 mg | ORAL_TABLET | Freq: Every day | ORAL | 0 refills | Status: DC

## 2021-11-15 NOTE — Assessment & Plan Note (Signed)
Continue port flush every 8 weeks. 

## 2021-11-15 NOTE — Assessment & Plan Note (Addendum)
Labs reviewed and discussed with patient.   PSA has been stable. Continue androgen deprivation therapy-Eligard 45 mg every 6 months. Continue darolutamide 600 mg twice daily  Hot flash due to ADT, he agrees with trials of Effexor 37.'5mg'$  daily, Rx sent. Rationale and side effects were discussed.

## 2021-11-15 NOTE — Assessment & Plan Note (Signed)
Smoke cessation discussed Nicotine patch Rx sent.  Recommend him to establish with PCP CT chest lung cancer screening.

## 2021-11-15 NOTE — Progress Notes (Signed)
Hematology/Oncology Progress note [phone] Fax:(336) [phone]      Patient Care Team: Patient, No Pcp Per as PCP - General (General Practice)  ASSESSMENT & PLAN:   Cancer Staging  Prostate cancer [facility]) Staging form: Prostate, AJCC 8th Edition - Clinical stage from 06/23/2020: Stage IVB (cT2c, cNX, cM1b, PSA: 678, Grade Group: 5) - Signed by Cody Server, MD on 06/23/2020   Prostate cancer [facility]) Labs reviewed and discussed with patient.   PSA has been stable. Continue androgen deprivation therapy-Eligard 45 mg every 6 months. Continue darolutamide 600 mg twice daily  Hot flash due to ADT, he agrees with trials of Effexor 37.'5mg'$  daily, Rx sent. Rationale and side effects were discussed.   Encounter for antineoplastic chemotherapy Chemotherapy plan as above  Port-A-Cath in place Continue port flush every 8 weeks  Tobacco use Smoke cessation discussed Nicotine patch Rx sent.  Recommend him to establish with PCP CT chest lung cancer screening.   Orders Placed This Encounter  Procedures   CT CHEST LUNG CA SCREEN LOW DOSE W/O CM    Standing Status:   Future    Standing Expiration Date:   11/16/2022    Order Specific Question:   Preferred Imaging Location?    Answer:   [facility]   CBC with Differential/Platelet    Standing Status:   Future    Standing Expiration Date:   11/16/2022   Comprehensive metabolic panel    Standing Status:   Future    Standing Expiration Date:   11/15/2022   PSA    Standing Status:   Future    Standing Expiration Date:   11/16/2022   Follow up in 3 months. Lab MD Eligard  All questions were answered. The patient knows to call the clinic with any problems, questions or concerns.  Cody Server, MD, PhD [facility] Hematology Oncology 11/15/2021   CHIEF COMPLAINTS/REASON FOR VISIT:  Follow-up for prostate cancer treatment  HISTORY OF PRESENTING ILLNESS:  05/27/2020 patient presented to emergency room for evaluation of  generalized weakness and and body pain.  Prior to the presentations, patient also had history of tooth abscess and was prescribed antibiotics. Reports acute on chronic right-sided lumbar pain without any prior trauma history.  Profound weakness.  Decreased appetite and loss of weight about 10 pounds during the past few months.  Also complained intermittent right upper quadrant discomfort  326 04/10/2020 CT abdomen pelvis showed a diffusely patchy sclerotic appearance of the osseous structures suspicious for osseous metastatic disease.  Prostate gland is heterogeneous in appearance.  Correlate with PSA.  Patient had a mild superior endplate compression deformity of L4 with less than 10% vertebral body height loss.  H indeterminate.  Fusiform infrarenal abdominal aortic aneurysm measuring up to 3.5 cm.  Follow-up in 2 years. Suspicious findings for early acute uncomplicated appendicitis.  Patient denies any right lower quadrant pain.  In the emergency room, he has a negative Murphy sign.  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  Highest Gleason score 5+4 Postprocedure, patient has had hematuria and hematochezia, bilateral lower extremity pain, AKI orthostatic hypovolemia, sepsis and was admitted and treated.  Patient was discharged on a course of antibiotics.  06/18/2020 MRI thoracic, lumbar, sacrum is compatible with diffuse osseous metastasis in the visualized thoracic lumbar spine, sacrum and bilateral iliac bones.  No definitive dural based tumor is identified.  No compression fracture.  Spine spondylosis  06/20/2019, patient received loading dose  of Firmagon 240 mg x 1. #06/22/2020, bone scan showed widespread osseous metastatic disease involving the appendicular and axial skeleton #April 2022. COVID infection. #07/10/2020, Dr. Lucky Vaughan placed Mediport #07/17/2020-10/30/2020 6 cycles of docetaxel.   #03/06/2021, started on darolutamide   [Cody Vaughan, the combination of darolutamide plus docetaxel is an approved option for treatment of metastatic CSPC- Improved OS, time to develop castration resistant disease].   #INTERVAL HISTORY DEL OVERFELT is a 61 y.o. male who has above history reviewed by me today presents for follow up visit for management of metastatic prostate cancer, Patient continues on darolutamide 600 mg twice daily.  Chronic back pain after work. + hot flash Patient tolerates treatments, denies any new complaints today.    Review of Systems  Constitutional:  Positive for fatigue. Negative for appetite change, chills, fever and unexpected weight change.       Patient walks independently  HENT:   Negative for hearing loss and voice change.   Eyes:  Negative for eye problems and icterus.  Respiratory:  Negative for chest tightness, cough and shortness of breath.   Cardiovascular:  Negative for chest pain and leg swelling.  Gastrointestinal:  Negative for abdominal distention and abdominal pain.  Endocrine: Positive for hot flashes.  Genitourinary:  Negative for difficulty urinating, dysuria and frequency.   Musculoskeletal:  Positive for back pain. Negative for arthralgias.  Skin:  Negative for itching and rash.  Neurological:  Negative for light-headedness and numbness.  Hematological:  Negative for adenopathy. Does not bruise/bleed easily.  Psychiatric/Behavioral:  Negative for confusion.     MEDICAL HISTORY:  Past Medical History:  Diagnosis Date   Prostate cancer ([place])    2022    SURGICAL HISTORY: Past Surgical History:  Procedure Laterality Date   PORTA CATH INSERTION N/A 07/10/2020   Procedure: PORTA CATH INSERTION;  Surgeon: Cody Huxley, MD;  Location: [place] [facility];  Service: Cardiovascular;  Laterality: N/A;   PROSTATE BIOPSY N/A 06/16/2020   Procedure: PROSTATE BIOPSY;  Surgeon: Cody Co, MD;  Location: [facility];  Service: Urology;  Laterality: N/A;   SHOULDER SURGERY      1985   TRANSRECTAL ULTRASOUND N/A 06/16/2020   Procedure: TRANSRECTAL ULTRASOUND;  Surgeon: Cody Co, MD;  Location: [facility];  Service: Urology;  Laterality: N/A;    SOCIAL HISTORY: Social History   Socioeconomic History   Marital status: Married    Spouse name: Not on file   Number of children: Not on file   Years of education: Not on file   Highest education level: Not on file  Occupational History   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  Tobacco Use   Smoking status: Every Day    Packs/day: 1.00    Years: 45.00    Total pack years: 45.00    Types: Cigarettes   Smokeless tobacco: Never  Substance and Sexual Activity   Alcohol use: Not Currently   Drug use: Never   Sexual activity: Not Currently  Other Topics Concern   Not on file  Social History Narrative   Not on file   Social Determinants of Health   Financial Resource Strain: Not on file  Food Insecurity: Not on file  Transportation Needs: Not on file  Physical Activity: Not on file  Stress: Not on file  Social Connections: Not on file  Intimate Partner Violence: Not on file    FAMILY HISTORY: Family History  Problem Relation Age of Onset  COPD Mother    Heart attack Father     ALLERGIES:  has No Known Allergies.  MEDICATIONS:  Current Outpatient Medications  Medication Sig Dispense Refill   acetaminophen (TYLENOL) 325 MG tablet Take 650 mg by mouth every 6 (six) hours as needed for moderate pain or mild pain.     Calcium 600-400 MG-UNIT CHEW Chew 2 tablets by mouth daily. 60 tablet 3   Multiple Vitamins-Minerals (MULTIVITAMIN WITH MINERALS) tablet Take 1 tablet by mouth daily.     nicotine (NICODERM CQ - DOSED IN MG/24 HOURS) 14 mg/24hr patch Place 1 patch (14 mg total) onto the skin daily. 28 patch 2   venlafaxine (EFFEXOR) 37.5 MG tablet Take 1 tablet (37.5 mg total) by mouth daily. 30 tablet 0   darolutamide (NUBEQA) 300 MG tablet Take 2 tablets (600 mg  total) by mouth 2 (two) times daily with a meal. 120 tablet 3   No current facility-administered medications for this visit.   Facility-Administered Medications Ordered in Other Visits  Medication Dose Route Frequency Provider Last Rate Last Admin   heparin lock flush 100 UNIT/ML injection              PHYSICAL EXAMINATION: ECOG PERFORMANCE STATUS: 0 - Asymptomatic Vitals:   11/15/21 1030  BP: 136/78  Pulse: (!) 51  Resp: 20  Temp: 97.8 F (36.6 C)  SpO2: 99%   Filed Weights   11/15/21 1030  Weight: 194 lb (88 kg)    Physical Exam Constitutional:      General: He is not in acute distress. HENT:     Head: Normocephalic and atraumatic.  Eyes:     General: No scleral icterus. Cardiovascular:     Rate and Rhythm: Normal rate and regular rhythm.     Heart sounds: Normal heart sounds.  Pulmonary:     Effort: Pulmonary effort is normal. No respiratory distress.     Breath sounds: No wheezing.  Abdominal:     General: Bowel sounds are normal. There is no distension.     Palpations: Abdomen is soft.  Musculoskeletal:        General: No deformity. Normal range of motion.     Cervical back: Normal range of motion and neck supple.  Skin:    General: Skin is warm and dry.     Findings: No erythema or rash.  Neurological:     Mental Status: He is alert and oriented to person, place, and time. Mental status is at baseline.     Cranial Nerves: No cranial nerve deficit.     Coordination: Coordination normal.  Psychiatric:        Mood and Affect: Mood normal.     LABORATORY DATA:  I have reviewed the data as listed Lab Results  Component Value Date   WBC 6.3 11/15/2021   HGB 13.4 11/15/2021   HCT 39.3 11/15/2021   MCV 89.7 11/15/2021   PLT 245 11/15/2021   Recent Labs    06/11/21 0935 08/14/21 1320 11/15/21 1020  NA 134* 134* 136  K 4.0 3.8 4.2  CL 102 102 106  CO2 '26 24 26  '$ GLUCOSE 128* 112* 113*  BUN '17 12 14  '$ CREATININE 0.78 0.81 0.76  CALCIUM 9.3 8.9  9.2  GFRNONAA >60 >60 >60  PROT 7.5 7.6 7.6  ALBUMIN 4.0 3.9 4.0  AST 30 30 34  ALT 41 41 45*  ALKPHOS 92 94 99  BILITOT 0.3 0.6 0.5    Iron/TIBC/Ferritin/ %Sat  Component Value Date/Time   IRON 121 07/17/2020 1000   TIBC 384 07/17/2020 1000   FERRITIN 299 07/17/2020 1000   IRONPCTSAT 32 07/17/2020 1000      RADIOGRAPHIC STUDIES: I have personally reviewed the radiological images as listed and agreed with the findings in the report. No results found.

## 2021-11-15 NOTE — Assessment & Plan Note (Signed)
Chemotherapy plan as above 

## 2021-11-23 ENCOUNTER — Inpatient Hospital Stay: Payer: Self-pay

## 2021-11-27 ENCOUNTER — Ambulatory Visit
Admission: RE | Admit: 2021-11-27 | Discharge: 2021-11-27 | Disposition: A | Discharge status: Home / Self Care | Payer: Self-pay | Source: Ambulatory Visit | Attending: Oncology | Admitting: Oncology

## 2021-11-27 DIAGNOSIS — F17200 Nicotine dependence, unspecified, uncomplicated: Secondary | ICD-10-CM | POA: Insufficient documentation

## 2021-12-12 ENCOUNTER — Other Ambulatory Visit: Payer: Self-pay | Admitting: Oncology

## 2021-12-28 ENCOUNTER — Inpatient Hospital Stay: Payer: Self-pay

## 2021-12-31 ENCOUNTER — Encounter (INDEPENDENT_AMBULATORY_CARE_PROVIDER_SITE_OTHER): Payer: Self-pay

## 2022-01-02 ENCOUNTER — Inpatient Hospital Stay: Payer: Self-pay | Attending: Oncology

## 2022-01-02 DIAGNOSIS — C61 Malignant neoplasm of prostate: Secondary | ICD-10-CM | POA: Insufficient documentation

## 2022-01-02 DIAGNOSIS — C7951 Secondary malignant neoplasm of bone: Secondary | ICD-10-CM | POA: Insufficient documentation

## 2022-01-02 DIAGNOSIS — Z95828 Presence of other vascular implants and grafts: Secondary | ICD-10-CM

## 2022-01-02 DIAGNOSIS — Z452 Encounter for adjustment and management of vascular access device: Secondary | ICD-10-CM | POA: Insufficient documentation

## 2022-01-02 MED ORDER — HEPARIN SOD (PORK) LOCK FLUSH 100 UNIT/ML IV SOLN
500.0000 [IU] | Freq: Once | INTRAVENOUS | Status: AC
  Administered 2022-01-02: 500 [IU] via INTRAVENOUS
  Filled 2022-01-02: qty 5

## 2022-01-02 MED ORDER — SODIUM CHLORIDE 0.9% FLUSH
10.0000 mL | Freq: Once | INTRAVENOUS | Status: AC
  Administered 2022-01-02: 10 mL via INTRAVENOUS
  Filled 2022-01-02: qty 10

## 2022-01-18 ENCOUNTER — Inpatient Hospital Stay: Payer: Self-pay

## 2022-01-21 ENCOUNTER — Other Ambulatory Visit: Payer: Self-pay | Admitting: Oncology

## 2022-02-01 ENCOUNTER — Telehealth: Payer: Self-pay

## 2022-02-01 NOTE — Telephone Encounter (Signed)
Oral Oncology Patient Advocate Encounter   Received notification that patient is due for re-enrollment for assistance for Nubeqa through [employer].   Re-enrollment process has been initiated and will be submitted upon completion of necessary documents.  [employer]'s phone number [phone].   I will continue to follow until final determination.  Berdine Addison, [facility] Patient [place]  [phone] (phone) [phone] (fax) 02/01/2022 9:19 AM

## 2022-02-01 NOTE — Telephone Encounter (Signed)
Oral Oncology Patient Advocate Encounter   Submitted application for assistance for Nubeqa to [employer].   Application submitted via e-fax to [phone]   [employer]'s phone number [phone].   I will continue to check the status until final determination.   Berdine Addison, [facility] Patient [place]  [phone] (phone) [phone] (fax) 02/01/2022 9:20 AM

## 2022-02-14 ENCOUNTER — Inpatient Hospital Stay (HOSPITAL_BASED_OUTPATIENT_CLINIC_OR_DEPARTMENT_OTHER): Payer: Self-pay | Admitting: Oncology

## 2022-02-14 ENCOUNTER — Encounter: Payer: Self-pay | Admitting: Oncology

## 2022-02-14 ENCOUNTER — Inpatient Hospital Stay: Payer: Self-pay

## 2022-02-14 ENCOUNTER — Inpatient Hospital Stay: Payer: Self-pay | Attending: Oncology

## 2022-02-14 VITALS — BP 139/75 | HR 65 | Temp 97.2°F | Resp 18 | Wt 193.2 lb

## 2022-02-14 DIAGNOSIS — C7951 Secondary malignant neoplasm of bone: Secondary | ICD-10-CM | POA: Insufficient documentation

## 2022-02-14 DIAGNOSIS — Z95828 Presence of other vascular implants and grafts: Secondary | ICD-10-CM

## 2022-02-14 DIAGNOSIS — F17210 Nicotine dependence, cigarettes, uncomplicated: Secondary | ICD-10-CM | POA: Insufficient documentation

## 2022-02-14 DIAGNOSIS — C61 Malignant neoplasm of prostate: Secondary | ICD-10-CM | POA: Insufficient documentation

## 2022-02-14 DIAGNOSIS — Z79899 Other long term (current) drug therapy: Secondary | ICD-10-CM | POA: Insufficient documentation

## 2022-02-14 DIAGNOSIS — R232 Flushing: Secondary | ICD-10-CM

## 2022-02-14 DIAGNOSIS — Z5111 Encounter for antineoplastic chemotherapy: Secondary | ICD-10-CM

## 2022-02-14 DIAGNOSIS — Z452 Encounter for adjustment and management of vascular access device: Secondary | ICD-10-CM | POA: Insufficient documentation

## 2022-02-14 LAB — COMPREHENSIVE METABOLIC PANEL
ALT: 42 U/L (ref 0–44)
AST: 35 U/L (ref 15–41)
Albumin: 3.8 g/dL (ref 3.5–5.0)
Alkaline Phosphatase: 97 U/L (ref 38–126)
Anion gap: 9 (ref 5–15)
BUN: 12 mg/dL (ref 8–23)
CO2: 25 mmol/L (ref 22–32)
Calcium: 8.8 mg/dL — ABNORMAL LOW (ref 8.9–10.3)
Chloride: 104 mmol/L (ref 98–111)
Creatinine, Ser: 0.75 mg/dL (ref 0.61–1.24)
GFR, Estimated: >60 mL/min (ref >60)
Glucose, Bld: 153 mg/dL — ABNORMAL HIGH (ref 70–99)
Potassium: 4 mmol/L (ref 3.5–5.1)
Sodium: 138 mmol/L (ref 135–145)
Total Bilirubin: 0.5 mg/dL (ref 0.3–1.2)
Total Protein: 7.3 g/dL (ref 6.5–8.1)

## 2022-02-14 LAB — CBC WITH DIFFERENTIAL/PLATELET
Abs Immature Granulocytes: 0.02 10*3/uL (ref 0.00–0.07)
Basophils Absolute: 0.1 10*3/uL (ref 0.0–0.1)
Basophils Relative: 1 %
Eosinophils Absolute: 0.2 10*3/uL (ref 0.0–0.5)
Eosinophils Relative: 3 %
HCT: 39 % (ref 39.0–52.0)
Hemoglobin: 13.3 g/dL (ref 13.0–17.0)
Immature Granulocytes: 0 %
Lymphocytes Relative: 27 %
Lymphs Abs: 1.9 10*3/uL (ref 0.7–4.0)
MCH: 30.5 pg (ref 26.0–34.0)
MCHC: 34.1 g/dL (ref 30.0–36.0)
MCV: 89.4 fL (ref 80.0–100.0)
Monocytes Absolute: 0.6 10*3/uL (ref 0.1–1.0)
Monocytes Relative: 8 %
Neutro Abs: 4.4 10*3/uL (ref 1.7–7.7)
Neutrophils Relative %: 61 %
Platelets: 237 10*3/uL (ref 150–400)
RBC: 4.36 MIL/uL (ref 4.22–5.81)
RDW: 13.6 % (ref 11.5–15.5)
WBC: 7.3 10*3/uL (ref 4.0–10.5)
nRBC: 0 % (ref 0.0–0.2)

## 2022-02-14 LAB — PSA: Prostatic Specific Antigen: 0.23 ng/mL (ref 0.00–4.00)

## 2022-02-14 MED ORDER — LEUPROLIDE ACETATE (6 MONTH) 45 MG ~~LOC~~ KIT
45.0000 mg | PACK | Freq: Once | SUBCUTANEOUS | Status: AC
  Administered 2022-02-14: 45 mg via SUBCUTANEOUS
  Filled 2022-02-14: qty 45

## 2022-02-14 NOTE — Assessment & Plan Note (Signed)
Continue port flush every 8 weeks. 

## 2022-02-14 NOTE — Assessment & Plan Note (Signed)
Chemotherapy plan as above 

## 2022-02-14 NOTE — Progress Notes (Signed)
Hematology/Oncology Progress note [phone] [phone]      Patient Care Team: Patient, No Pcp Per as PCP - General (General Practice)  ASSESSMENT & PLAN:   Cancer Staging  Prostate cancer [facility]) Staging form: Prostate, AJCC 8th Edition - Clinical stage from 06/23/2020: Stage IVB (cT2c, cNX, cM1b, PSA: 678, Grade Group: 5) - Signed by Cody Server, MD on 06/23/2020   Prostate cancer [facility]) Labs reviewed and discussed with patient.   PSA has been stable, slight increase. Close monitor  Continue androgen deprivation therapy-Eligard 45 mg every 6 months. Continue darolutamide 600 mg twice daily Refer to genetic counselor for genetic testing he agrees.    Encounter for antineoplastic chemotherapy Chemotherapy plan as above  Vasomotor flushing Hot flash due to ADT, continue Effexor 37.'5mg'$  daily.  Port-A-Cath in place Continue port flush every 8 weeks  Orders Placed This Encounter  Procedures   CBC with Differential/Platelet    Standing Status:   Future    Standing Expiration Date:   02/14/2023   Comprehensive metabolic panel    Standing Status:   Future    Standing Expiration Date:   02/14/2023   PSA    Standing Status:   Future    Standing Expiration Date:   02/15/2023   Ambulatory referral to Genetics    Referral Priority:   Routine    Referral Type:   Consultation    Referral Reason:   Specialty Services Required    Referred to Provider:   Faith Rogue Vaughan    Number of Visits Requested:   1   Follow up in 3 months. Lab MD   All questions were answered. The patient knows to call the clinic with any problems, questions or concerns.  Cody Server, MD, PhD [facility] Hematology Oncology 02/14/2022   CHIEF COMPLAINTS/REASON FOR VISIT:  Follow-up for prostate cancer treatment  HISTORY OF PRESENTING ILLNESS:  05/27/2020 patient presented to emergency room for evaluation of generalized weakness and and body pain.  Prior to the presentations, patient  also had history of tooth abscess and was prescribed antibiotics. Reports acute on chronic right-sided lumbar pain without any prior trauma history.  Profound weakness.  Decreased appetite and loss of weight about 10 pounds during the past few months.  Also complained intermittent right upper quadrant discomfort  326 04/10/2020 CT abdomen pelvis showed a diffusely patchy sclerotic appearance of the osseous structures suspicious for osseous metastatic disease.  Prostate gland is heterogeneous in appearance.  Correlate with PSA.  Patient had a mild superior endplate compression deformity of L4 with less than 10% vertebral body height loss.  H indeterminate.  Fusiform infrarenal abdominal aortic aneurysm measuring up to 3.5 cm.  Follow-up in 2 years. Suspicious findings for early acute uncomplicated appendicitis.  Patient denies any right lower quadrant pain.  In the emergency room, he has a negative Murphy sign.  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  Highest Gleason score 5+4 Postprocedure, patient has had hematuria and hematochezia, bilateral lower extremity pain, AKI orthostatic hypovolemia, sepsis and was admitted and treated.  Patient was discharged on a course of antibiotics.  06/18/2020 MRI thoracic, lumbar, sacrum is compatible with diffuse osseous metastasis in the visualized thoracic lumbar spine, sacrum and bilateral iliac bones.  No definitive dural based tumor is identified.  No compression fracture.  Spine spondylosis  06/20/2019, patient received loading dose of Firmagon 240 mg x 1. #06/22/2020, bone scan showed widespread osseous  metastatic disease involving the appendicular and axial skeleton #April 2022. COVID infection. #07/10/2020, Dr. Lucky Vaughan placed Mediport #07/17/2020-10/30/2020 6 cycles of docetaxel.   #03/06/2021, started on darolutamide  [ARASENS trial, the combination of darolutamide plus docetaxel is an approved  option for treatment of metastatic CSPC- Improved OS, time to develop castration resistant disease].   #INTERVAL HISTORY Cody Vaughan is a 61 y.o. male who has above history reviewed by me today presents for follow up visit for management of metastatic prostate cancer, Patient continues on darolutamide 600 mg twice daily.  Chronic back pain after work. + hot flash improved after taking Effexor.  Patient tolerates treatments, denies any new complaints today.    Review of Systems  Constitutional:  Positive for fatigue. Negative for appetite change, chills, fever and unexpected weight change.       Patient walks independently  HENT:   Negative for hearing loss and voice change.   Eyes:  Negative for eye problems and icterus.  Respiratory:  Negative for chest tightness, cough and shortness of breath.   Cardiovascular:  Negative for chest pain and leg swelling.  Gastrointestinal:  Negative for abdominal distention and abdominal pain.  Endocrine: Positive for hot flashes.  Genitourinary:  Negative for difficulty urinating, dysuria and frequency.   Musculoskeletal:  Positive for back pain. Negative for arthralgias.  Skin:  Negative for itching and rash.  Neurological:  Negative for light-headedness and numbness.  Hematological:  Negative for adenopathy. Does not bruise/bleed easily.  Psychiatric/Behavioral:  Negative for confusion.     MEDICAL HISTORY:  Past Medical History:  Diagnosis Date   Prostate cancer ([place])    2022    SURGICAL HISTORY: Past Surgical History:  Procedure Laterality Date   PORTA CATH INSERTION N/A 07/10/2020   Procedure: PORTA CATH INSERTION;  Surgeon: Cody Huxley, MD;  Location: [place] [facility];  Service: Cardiovascular;  Laterality: N/A;   PROSTATE BIOPSY N/A 06/16/2020   Procedure: PROSTATE BIOPSY;  Surgeon: Cody Co, MD;  Location: [facility];  Service: Urology;  Laterality: N/A;   SHOULDER SURGERY     1985   TRANSRECTAL ULTRASOUND N/A 06/16/2020    Procedure: TRANSRECTAL ULTRASOUND;  Surgeon: Cody Co, MD;  Location: [facility];  Service: Urology;  Laterality: N/A;    SOCIAL HISTORY: Social History   Socioeconomic History   Marital status: Married    Spouse name: Not on file   Number of children: Not on file   Years of education: Not on file   Highest education level: Not on file  Occupational History   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  Tobacco Use   Smoking status: Every Day    Packs/day: 1.00    Years: 45.00    Total pack years: 45.00    Types: Cigarettes   Smokeless tobacco: Never  Substance and Sexual Activity   Alcohol use: Not Currently   Drug use: Never   Sexual activity: Not Currently  Other Topics Concern   Not on file  Social History Narrative   Not on file   Social Determinants of Health   Financial Resource Strain: Not on file  Food Insecurity: Not on file  Transportation Needs: Not on file  Physical Activity: Not on file  Stress: Not on file  Social Connections: Not on file  Intimate Partner Violence: Not on file    FAMILY HISTORY: Family History  Problem Relation Age of Onset   COPD Mother  Heart attack Father     ALLERGIES:  has No Known Allergies.  MEDICATIONS:  Current Outpatient Medications  Medication Sig Dispense Refill   acetaminophen (TYLENOL) 325 MG tablet Take 650 mg by mouth every 6 (six) hours as needed for moderate pain or mild pain.     Calcium 600-400 MG-UNIT CHEW Chew 2 tablets by mouth daily. 60 tablet 3   darolutamide (NUBEQA) 300 MG tablet Take 2 tablets (600 mg total) by mouth 2 (two) times daily with a meal. 120 tablet 3   Multiple Vitamins-Minerals (MULTIVITAMIN WITH MINERALS) tablet Take 1 tablet by mouth daily.     nicotine (NICODERM CQ - DOSED IN MG/24 HOURS) 14 mg/24hr patch PLACE 1 PATCH ONTO THE SKIN DAILY 30 patch 0   venlafaxine (EFFEXOR) 37.5 MG tablet TAKE ONE TABLET BY MOUTH DAILY 90 tablet 1   No current  facility-administered medications for this visit.   Facility-Administered Medications Ordered in Other Visits  Medication Dose Route Frequency Provider Last Rate Last Admin   heparin lock flush 100 UNIT/ML injection              PHYSICAL EXAMINATION: ECOG PERFORMANCE STATUS: 0 - Asymptomatic Vitals:   02/14/22 1028  BP: 139/75  Pulse: 65  Resp: 18  Temp: (!) 97.2 F (36.2 C)   Filed Weights   02/14/22 1028  Weight: 193 lb 3.2 oz (87.6 kg)    Physical Exam Constitutional:      General: He is not in acute distress. HENT:     Head: Normocephalic and atraumatic.  Eyes:     General: No scleral icterus. Cardiovascular:     Rate and Rhythm: Normal rate and regular rhythm.     Heart sounds: Normal heart sounds.  Pulmonary:     Effort: Pulmonary effort is normal. No respiratory distress.     Breath sounds: No wheezing.  Abdominal:     General: Bowel sounds are normal. There is no distension.     Palpations: Abdomen is soft.  Musculoskeletal:        General: No deformity. Normal range of motion.     Cervical back: Normal range of motion and neck supple.  Skin:    General: Skin is warm and dry.     Findings: No erythema or rash.  Neurological:     Mental Status: He is alert and oriented to person, place, and time. Mental status is at baseline.     Cranial Nerves: No cranial nerve deficit.     Coordination: Coordination normal.  Psychiatric:        Mood and Affect: Mood normal.     LABORATORY DATA:  I have reviewed the data as listed Lab Results  Component Value Date   WBC 7.3 02/14/2022   HGB 13.3 02/14/2022   HCT 39.0 02/14/2022   MCV 89.4 02/14/2022   PLT 237 02/14/2022   Recent Labs    08/14/21 1320 11/15/21 1020 02/14/22 1011  NA 134* 136 138  K 3.8 4.2 4.0  CL 102 106 104  CO2 '24 26 25  '$ GLUCOSE 112* 113* 153*  BUN '12 14 12  '$ CREATININE 0.81 0.76 0.75  CALCIUM 8.9 9.2 8.8*  GFRNONAA >60 >60 >60  PROT 7.6 7.6 7.3  ALBUMIN 3.9 4.0 3.8  AST 30 34  35  ALT 41 45* 42  ALKPHOS 94 99 97  BILITOT 0.6 0.5 0.5    Iron/TIBC/Ferritin/ %Sat    Component Value Date/Time   IRON 121 07/17/2020 1000   TIBC  384 07/17/2020 1000   FERRITIN 299 07/17/2020 1000   IRONPCTSAT 32 07/17/2020 1000      RADIOGRAPHIC STUDIES: I have personally reviewed the radiological images as listed and agreed with the findings in the report. CT CHEST LUNG CA SCREEN LOW DOSE W/O CM  Result Date: 11/29/2021 CLINICAL DATA:  60 year old male current smoker with 90 pack-year history of smoking. Lung cancer screening examination. History of metastatic prostate cancer. EXAM: CT CHEST WITHOUT CONTRAST LOW-DOSE FOR LUNG CANCER SCREENING TECHNIQUE: Multidetector CT imaging of the chest was performed following the standard protocol without IV contrast. RADIATION DOSE REDUCTION: This exam was performed according to the departmental dose-optimization program which includes automated exposure control, adjustment of the mA and/or kV according to patient size and/or use of iterative reconstruction technique. COMPARISON:  Chest CT 06/14/2020. FINDINGS: Cardiovascular: Heart size is normal. There is no significant pericardial fluid, thickening or pericardial calcification. There is aortic atherosclerosis, as well as atherosclerosis of the great vessels of the mediastinum and the coronary arteries, including calcified atherosclerotic plaque in the left main, left circumflex and right coronary arteries. Aneurysmal dilatation of the ascending thoracic aorta (4.7 cm in diameter). Right internal jugular single-lumen Port-A-Cath with tip terminating at the superior cavoatrial junction. Mediastinum/Nodes: No pathologically enlarged mediastinal or hilar lymph nodes. Please note that accurate exclusion of hilar adenopathy is limited on noncontrast CT scans. Esophagus is unremarkable in appearance. No axillary lymphadenopathy. Lungs/Pleura: Tiny calcified granuloma in the medial aspect of the right lower  lobe. No other suspicious appearing pulmonary nodules or masses are noted. No acute consolidative airspace disease. No pleural effusions. Diffuse bronchial wall thickening with mild centrilobular and paraseptal emphysema. Upper Abdomen: Aortic atherosclerosis. Musculoskeletal: Diffuse sclerotic lesions noted throughout all aspects of the visualized axial and appendicular skeleton, similar to the prior study, compatible with widespread metastatic disease to the bones in this patient with known history of prostate cancer. IMPRESSION: 1. Lung-RADS 1S, negative. Continue annual screening with low-dose chest CT without contrast in 12 months. 2. The "S" modifier above refers to potentially clinically significant non lung cancer related findings. Specifically, there is aortic atherosclerosis, in addition to left main and 2 vessel coronary artery disease. Please note that although the presence of coronary artery calcium documents the presence of coronary artery disease, the severity of this disease and any potential stenosis cannot be assessed on this non-gated CT examination. Assessment for potential risk factor modification, dietary therapy or pharmacologic therapy may be warranted, if clinically indicated. 3. There is also aneurysmal dilatation of the ascending thoracic aorta (4.7 cm in diameter). Recommend semi-annual imaging followup by CTA or MRA and referral to cardiothoracic surgery if not already obtained. This recommendation follows 2010 ACCF/AHA/AATS/ACR/ASA/SCA/SCAI/SIR/STS/SVM Guidelines for the Diagnosis and Management of Patients With Thoracic Aortic Disease. Circulation. 2010; 121: R838-F840. Aortic aneurysm NOS (ICD10-I71.9) 4. Mild diffuse bronchial wall thickening with mild centrilobular and paraseptal emphysema; imaging findings suggestive of underlying COPD. Aortic Atherosclerosis (ICD10-I70.0), Aortic aneurysm NOS (ICD10-I71.9) and Emphysema (ICD10-J43.9). Electronically Signed   By: Vinnie Langton  M.D.   On: 11/29/2021 06:42

## 2022-02-14 NOTE — Assessment & Plan Note (Addendum)
Labs reviewed and discussed with patient.   PSA has been stable, slight increase. Close monitor  Continue androgen deprivation therapy-Eligard 45 mg every 6 months. Continue darolutamide 600 mg twice daily Refer to genetic counselor for genetic testing he agrees.

## 2022-02-14 NOTE — Assessment & Plan Note (Signed)
Hot flash due to ADT, continue Effexor 37.'5mg'$  daily.

## 2022-02-15 ENCOUNTER — Other Ambulatory Visit: Payer: Self-pay | Admitting: *Deleted

## 2022-02-15 DIAGNOSIS — C61 Malignant neoplasm of prostate: Secondary | ICD-10-CM

## 2022-02-18 MED ORDER — DAROLUTAMIDE 300 MG PO TABS: 600.0000 mg | ORAL_TABLET | Freq: Two times a day (BID) | ORAL | 3 refills | Status: DC

## 2022-02-19 ENCOUNTER — Other Ambulatory Visit: Payer: Self-pay | Admitting: Pharmacist

## 2022-02-19 DIAGNOSIS — C61 Malignant neoplasm of prostate: Secondary | ICD-10-CM

## 2022-02-19 MED ORDER — DAROLUTAMIDE 300 MG PO TABS: 600.0000 mg | ORAL_TABLET | Freq: Two times a day (BID) | ORAL | 3 refills | Status: DC

## 2022-02-19 NOTE — Progress Notes (Signed)
Prescription for 2024 re-enrollment sent to [facility] dispensing pharmacy, [facility].

## 2022-02-20 ENCOUNTER — Encounter: Payer: Self-pay | Admitting: Licensed Clinical Social Worker

## 2022-02-20 ENCOUNTER — Other Ambulatory Visit: Payer: Self-pay

## 2022-02-20 NOTE — Telephone Encounter (Signed)
Received notification that [employer] required a new prescription. Prescription has been sent to [phone]. I will continue to follow and update until final determination.   Berdine Addison, [facility] Patient [place]  [phone] (phone) [phone] (fax) 02/20/2022 8:10 AM

## 2022-02-22 ENCOUNTER — Inpatient Hospital Stay: Payer: Self-pay

## 2022-02-22 DIAGNOSIS — Z95828 Presence of other vascular implants and grafts: Secondary | ICD-10-CM

## 2022-02-22 MED ORDER — SODIUM CHLORIDE 0.9% FLUSH
10.0000 mL | Freq: Once | INTRAVENOUS | Status: AC
  Administered 2022-02-22: 10 mL via INTRAVENOUS
  Filled 2022-02-22: qty 10

## 2022-02-22 MED ORDER — HEPARIN SOD (PORK) LOCK FLUSH 100 UNIT/ML IV SOLN
500.0000 [IU] | Freq: Once | INTRAVENOUS | Status: AC
  Administered 2022-02-22: 500 [IU] via INTRAVENOUS
  Filled 2022-02-22: qty 5

## 2022-02-22 NOTE — Telephone Encounter (Signed)
Received notification that application is processing. I will continue to follow and update until final determination.   Berdine Addison, [facility] Patient [place]  [phone] (phone) [phone] (fax) 02/22/2022 8:42 AM

## 2022-03-14 ENCOUNTER — Other Ambulatory Visit: Payer: Self-pay | Admitting: Oncology

## 2022-03-26 ENCOUNTER — Inpatient Hospital Stay: Payer: Self-pay | Admitting: Licensed Clinical Social Worker

## 2022-03-26 ENCOUNTER — Inpatient Hospital Stay: Payer: Self-pay | Attending: Oncology

## 2022-03-26 ENCOUNTER — Other Ambulatory Visit: Payer: Self-pay

## 2022-03-26 DIAGNOSIS — Z95828 Presence of other vascular implants and grafts: Secondary | ICD-10-CM

## 2022-03-26 DIAGNOSIS — Z452 Encounter for adjustment and management of vascular access device: Secondary | ICD-10-CM | POA: Insufficient documentation

## 2022-03-26 DIAGNOSIS — C61 Malignant neoplasm of prostate: Secondary | ICD-10-CM | POA: Insufficient documentation

## 2022-03-26 DIAGNOSIS — C7951 Secondary malignant neoplasm of bone: Secondary | ICD-10-CM | POA: Insufficient documentation

## 2022-03-26 MED ORDER — SODIUM CHLORIDE 0.9% FLUSH
10.0000 mL | Freq: Once | INTRAVENOUS | Status: AC
  Administered 2022-03-26: 10 mL via INTRAVENOUS
  Filled 2022-03-26: qty 10

## 2022-03-26 MED ORDER — HEPARIN SOD (PORK) LOCK FLUSH 100 UNIT/ML IV SOLN
500.0000 [IU] | Freq: Once | INTRAVENOUS | Status: AC
  Administered 2022-03-26: 500 [IU] via INTRAVENOUS
  Filled 2022-03-26: qty 5

## 2022-03-28 NOTE — Telephone Encounter (Signed)
Oral Oncology Patient Advocate Encounter   Received notification re-enrollment for assistance for Nubeqa through [employer] has been approved. Patient may continue to receive their medication at $0 from this program.    [employer]'s phone number [phone].   Effective dates: 03/04/22 through 03/04/23  I have spoken to the patient.  Berdine Addison, [facility] Patient [place]  [phone] (phone) [phone] (fax) 03/28/2022 1:45 PM

## 2022-05-17 ENCOUNTER — Inpatient Hospital Stay: Payer: Self-pay | Admitting: Oncology

## 2022-05-17 ENCOUNTER — Inpatient Hospital Stay: Payer: Self-pay

## 2022-05-21 ENCOUNTER — Inpatient Hospital Stay (HOSPITAL_BASED_OUTPATIENT_CLINIC_OR_DEPARTMENT_OTHER): Payer: Self-pay | Admitting: Oncology

## 2022-05-21 ENCOUNTER — Inpatient Hospital Stay: Payer: Self-pay | Attending: Oncology

## 2022-05-21 ENCOUNTER — Telehealth: Payer: Self-pay

## 2022-05-21 ENCOUNTER — Encounter: Payer: Self-pay | Admitting: Oncology

## 2022-05-21 VITALS — BP 119/75 | HR 85 | Temp 96.7°F | Wt 192.5 lb

## 2022-05-21 DIAGNOSIS — C61 Malignant neoplasm of prostate: Secondary | ICD-10-CM

## 2022-05-21 DIAGNOSIS — Z5111 Encounter for antineoplastic chemotherapy: Secondary | ICD-10-CM

## 2022-05-21 DIAGNOSIS — Z95828 Presence of other vascular implants and grafts: Secondary | ICD-10-CM

## 2022-05-21 DIAGNOSIS — F17210 Nicotine dependence, cigarettes, uncomplicated: Secondary | ICD-10-CM | POA: Insufficient documentation

## 2022-05-21 DIAGNOSIS — Z720 Tobacco use: Secondary | ICD-10-CM

## 2022-05-21 DIAGNOSIS — Z79899 Other long term (current) drug therapy: Secondary | ICD-10-CM | POA: Insufficient documentation

## 2022-05-21 DIAGNOSIS — G8929 Other chronic pain: Secondary | ICD-10-CM | POA: Insufficient documentation

## 2022-05-21 DIAGNOSIS — Z452 Encounter for adjustment and management of vascular access device: Secondary | ICD-10-CM | POA: Insufficient documentation

## 2022-05-21 DIAGNOSIS — M549 Dorsalgia, unspecified: Secondary | ICD-10-CM | POA: Insufficient documentation

## 2022-05-21 DIAGNOSIS — C7951 Secondary malignant neoplasm of bone: Secondary | ICD-10-CM | POA: Insufficient documentation

## 2022-05-21 LAB — COMPREHENSIVE METABOLIC PANEL
ALT: 41 U/L (ref 0–44)
AST: 35 U/L (ref 15–41)
Albumin: 4 g/dL (ref 3.5–5.0)
Alkaline Phosphatase: 102 U/L (ref 38–126)
Anion gap: 7 (ref 5–15)
BUN: 13 mg/dL (ref 8–23)
CO2: 25 mmol/L (ref 22–32)
Calcium: 9 mg/dL (ref 8.9–10.3)
Chloride: 104 mmol/L (ref 98–111)
Creatinine, Ser: 0.81 mg/dL (ref 0.61–1.24)
GFR, Estimated: >60 mL/min (ref >60)
Glucose, Bld: 151 mg/dL — ABNORMAL HIGH (ref 70–99)
Potassium: 3.8 mmol/L (ref 3.5–5.1)
Sodium: 136 mmol/L (ref 135–145)
Total Bilirubin: 0.6 mg/dL (ref 0.3–1.2)
Total Protein: 7.5 g/dL (ref 6.5–8.1)

## 2022-05-21 LAB — CBC WITH DIFFERENTIAL/PLATELET
Abs Immature Granulocytes: 0.03 10*3/uL (ref 0.00–0.07)
Basophils Absolute: 0.1 10*3/uL (ref 0.0–0.1)
Basophils Relative: 1 %
Eosinophils Absolute: 0.3 10*3/uL (ref 0.0–0.5)
Eosinophils Relative: 4 %
HCT: 37.3 % — ABNORMAL LOW (ref 39.0–52.0)
Hemoglobin: 12.5 g/dL — ABNORMAL LOW (ref 13.0–17.0)
Immature Granulocytes: 0 %
Lymphocytes Relative: 26 %
Lymphs Abs: 2 10*3/uL (ref 0.7–4.0)
MCH: 30 pg (ref 26.0–34.0)
MCHC: 33.5 g/dL (ref 30.0–36.0)
MCV: 89.7 fL (ref 80.0–100.0)
Monocytes Absolute: 0.6 10*3/uL (ref 0.1–1.0)
Monocytes Relative: 8 %
Neutro Abs: 4.7 10*3/uL (ref 1.7–7.7)
Neutrophils Relative %: 61 %
Platelets: 251 10*3/uL (ref 150–400)
RBC: 4.16 MIL/uL — ABNORMAL LOW (ref 4.22–5.81)
RDW: 13.7 % (ref 11.5–15.5)
WBC: 7.6 10*3/uL (ref 4.0–10.5)
nRBC: 0 % (ref 0.0–0.2)

## 2022-05-21 LAB — PSA: Prostatic Specific Antigen: 0.57 ng/mL (ref 0.00–4.00)

## 2022-05-21 MED ORDER — HEPARIN SOD (PORK) LOCK FLUSH 100 UNIT/ML IV SOLN
500.0000 [IU] | Freq: Once | INTRAVENOUS | Status: AC
  Administered 2022-05-21: 500 [IU] via INTRAVENOUS
  Filled 2022-05-21: qty 5

## 2022-05-21 MED ORDER — SODIUM CHLORIDE 0.9% FLUSH
10.0000 mL | Freq: Once | INTRAVENOUS | Status: AC
  Administered 2022-05-21: 10 mL via INTRAVENOUS
  Filled 2022-05-21: qty 10

## 2022-05-21 NOTE — Assessment & Plan Note (Addendum)
Labs reviewed and discussed with patient.   PSA has been stable, slight increase. Close monitor  Continue androgen deprivation therapy-Eligard 45 mg every 6 months.-mid June 2024 Continue darolutamide 600 mg twice daily Check NGS molecular studies. Patient declined genetic testing.

## 2022-05-21 NOTE — Assessment & Plan Note (Addendum)
Smoke cessation discussed Recommend him to establish with PCP CT chest lung cancer screening is up-to-date.

## 2022-05-21 NOTE — Telephone Encounter (Signed)
[facility] NGS requested on [id number], prostate biopsy, collected 06/16/2020.  Financial application form submitted and was approved. Patient OOP cost will be $0

## 2022-05-21 NOTE — Progress Notes (Signed)
Hematology/Oncology Progress note Telephone:(336) [phone] Fax:(336) [phone]      CHIEF COMPLAINTS/REASON FOR VISIT:  Follow-up for prostate cancer treatment   ASSESSMENT & PLAN:   Cancer Staging  Prostate cancer [facility]) Staging form: Prostate, AJCC 8th Edition - Clinical stage from 06/23/2020: Stage IVB (cT2c, cNX, cM1b, PSA: 678, Grade Group: 5) - Signed by Earlie Server, MD on 06/23/2020   Prostate cancer [facility]) Labs reviewed and discussed with patient.   PSA has been stable, slight increase. Close monitor  Continue androgen deprivation therapy-Eligard 45 mg every 6 months.-mid June 2024 Continue darolutamide 600 mg twice daily Check NGS molecular studies. Patient declined genetic testing.    Port-A-Cath in place Continue port flush every 8 weeks  Encounter for antineoplastic chemotherapy Chemotherapy plan as above  Tobacco use Smoke cessation discussed Recommend him to establish with PCP CT chest lung cancer screening is up-to-date.  Orders Placed This Encounter  Procedures   CBC with Differential ([place] Only)    Standing Status:   Future    Standing Expiration Date:   05/21/2023   CMP ([place] only)    Standing Status:   Future    Standing Expiration Date:   05/21/2023   PSA    Standing Status:   Future    Standing Expiration Date:   05/21/2023   Follow up in 3 months. Lab MD   All questions were answered. The patient knows to call the clinic with any problems, questions or concerns.  Earlie Server, MD, PhD [facility] Hematology Oncology 05/21/2022    HISTORY OF PRESENTING ILLNESS:  05/27/2020 patient presented to emergency room for evaluation of generalized weakness and and body pain.  Prior to the presentations, patient also had history of tooth abscess and was prescribed antibiotics. Reports acute on chronic right-sided lumbar pain without any prior trauma history.  Profound weakness.  Decreased appetite and loss of weight about 10 pounds during the  past few months.  Also complained intermittent right upper quadrant discomfort  326 04/10/2020 CT abdomen pelvis showed a diffusely patchy sclerotic appearance of the osseous structures suspicious for osseous metastatic disease.  Prostate gland is heterogeneous in appearance.  Correlate with PSA.  Patient had a mild superior endplate compression deformity of L4 with less than 10% vertebral body height loss.  H indeterminate.  Fusiform infrarenal abdominal aortic aneurysm measuring up to 3.5 cm.  Follow-up in 2 years. Suspicious findings for early acute uncomplicated appendicitis.  Patient denies any right lower quadrant pain.  In the emergency room, he has a negative Murphy sign.  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  Highest Gleason score 5+4 Postprocedure, patient has had hematuria and hematochezia, bilateral lower extremity pain, AKI orthostatic hypovolemia, sepsis and was admitted and treated.  Patient was discharged on a course of antibiotics.  06/18/2020 MRI thoracic, lumbar, sacrum is compatible with diffuse osseous metastasis in the visualized thoracic lumbar spine, sacrum and bilateral iliac bones.  No definitive dural based tumor is identified.  No compression fracture.  Spine spondylosis  06/20/2019, patient received loading dose of Firmagon 240 mg x 1. #06/22/2020, bone scan showed widespread osseous metastatic disease involving the appendicular and axial skeleton #April 2022. COVID infection. #07/10/2020, Dr. Lucky Cowboy placed Mediport #07/17/2020-10/30/2020 6 cycles of docetaxel.   #03/06/2021, started on darolutamide  [ARASENS trial, the combination of darolutamide plus docetaxel is an approved option for treatment of metastatic CSPC- Improved OS, time to develop castration resistant  disease].   #INTERVAL HISTORY BELEN LALLO is a 62 y.o. male who has above history reviewed by me today presents for follow up visit for  management of metastatic prostate cancer, Patient continues on darolutamide 600 mg twice daily.  Chronic back pain after work. + hot flash improved after taking Effexor.  Patient tolerates treatments, patient denies any new complaints today.  Review of Systems  Constitutional:  Positive for fatigue. Negative for appetite change, chills, fever and unexpected weight change.       Patient walks independently  HENT:   Negative for hearing loss and voice change.   Eyes:  Negative for eye problems and icterus.  Respiratory:  Negative for chest tightness, cough and shortness of breath.   Cardiovascular:  Negative for chest pain and leg swelling.  Gastrointestinal:  Negative for abdominal distention and abdominal pain.  Endocrine: Positive for hot flashes.  Genitourinary:  Negative for difficulty urinating, dysuria and frequency.   Musculoskeletal:  Positive for back pain. Negative for arthralgias.  Skin:  Negative for itching and rash.  Neurological:  Negative for light-headedness and numbness.  Hematological:  Negative for adenopathy. Does not bruise/bleed easily.  Psychiatric/Behavioral:  Negative for confusion.     MEDICAL HISTORY:  Past Medical History:  Diagnosis Date   Prostate cancer ([place])    2022    SURGICAL HISTORY: Past Surgical History:  Procedure Laterality Date   PORTA CATH INSERTION N/A 07/10/2020   Procedure: PORTA CATH INSERTION;  Surgeon: Algernon Huxley, MD;  Location: [place] [facility];  Service: Cardiovascular;  Laterality: N/A;   PROSTATE BIOPSY N/A 06/16/2020   Procedure: PROSTATE BIOPSY;  Surgeon: Billey Co, MD;  Location: [facility] ORS;  Service: Urology;  Laterality: N/A;   SHOULDER SURGERY     1985   TRANSRECTAL ULTRASOUND N/A 06/16/2020   Procedure: TRANSRECTAL ULTRASOUND;  Surgeon: Billey Co, MD;  Location: [facility] ORS;  Service: Urology;  Laterality: N/A;    SOCIAL HISTORY: Social History   Socioeconomic History   Marital status: Married     Spouse name: Not on file   Number of children: Not on file   Years of education: Not on file   Highest education level: Not on file  Occupational History   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  Tobacco Use   Smoking status: Every Day    Packs/day: 1.00    Years: 45.00    Additional pack years: 0.00    Total pack years: 45.00    Types: Cigarettes   Smokeless tobacco: Never  Substance and Sexual Activity   Alcohol use: Not Currently   Drug use: Never   Sexual activity: Not Currently  Other Topics Concern   Not on file  Social History Narrative   Not on file   Social Determinants of Health   Financial Resource Strain: Not on file  Food Insecurity: Not on file  Transportation Needs: Not on file  Physical Activity: Not on file  Stress: Not on file  Social Connections: Not on file  Intimate Partner Violence: Not on file    FAMILY HISTORY: Family History  Problem Relation Age of Onset   COPD Mother    Heart attack Father     ALLERGIES:  has No Known Allergies.  MEDICATIONS:  Current Outpatient Medications  Medication Sig Dispense Refill   acetaminophen (TYLENOL) 325 MG tablet Take 650 mg by mouth every 6 (six) hours as needed for moderate pain or mild  pain.     Calcium 600-400 MG-UNIT CHEW Chew 2 tablets by mouth daily. 60 tablet 3   darolutamide (NUBEQA) 300 MG tablet Take 2 tablets (600 mg total) by mouth 2 (two) times daily with a meal. 120 tablet 3   Multiple Vitamins-Minerals (MULTIVITAMIN WITH MINERALS) tablet Take 1 tablet by mouth daily.     nicotine (NICODERM CQ - DOSED IN MG/24 HOURS) 14 mg/24hr patch PLACE 1 PATCH ONTO THE SKIN DAILY 30 patch 0   venlafaxine (EFFEXOR) 37.5 MG tablet TAKE ONE TABLET BY MOUTH DAILY 90 tablet 1   No current facility-administered medications for this visit.   Facility-Administered Medications Ordered in Other Visits  Medication Dose Route Frequency Provider Last Rate Last Admin   heparin lock  flush 100 UNIT/ML injection              PHYSICAL EXAMINATION: ECOG PERFORMANCE STATUS: 0 - Asymptomatic Vitals:   05/21/22 1029  BP: 119/75  Pulse: 85  Temp: (!) 96.7 F (35.9 C)  SpO2: 99%   Filed Weights   05/21/22 1029  Weight: 192 lb 8 oz (87.3 kg)    Physical Exam Constitutional:      General: He is not in acute distress. HENT:     Head: Normocephalic and atraumatic.  Eyes:     General: No scleral icterus. Cardiovascular:     Rate and Rhythm: Normal rate and regular rhythm.     Heart sounds: Normal heart sounds.  Pulmonary:     Effort: Pulmonary effort is normal. No respiratory distress.     Breath sounds: No wheezing.  Abdominal:     General: Bowel sounds are normal. There is no distension.     Palpations: Abdomen is soft.  Musculoskeletal:        General: No deformity. Normal range of motion.     Cervical back: Normal range of motion and neck supple.  Skin:    General: Skin is warm and dry.     Findings: No erythema or rash.  Neurological:     Mental Status: He is alert and oriented to person, place, and time. Mental status is at baseline.     Cranial Nerves: No cranial nerve deficit.     Coordination: Coordination normal.  Psychiatric:        Mood and Affect: Mood normal.     LABORATORY DATA:  I have reviewed the data as listed Lab Results  Component Value Date   WBC 7.6 05/21/2022   HGB 12.5 (L) 05/21/2022   HCT 37.3 (L) 05/21/2022   MCV 89.7 05/21/2022   PLT 251 05/21/2022   Recent Labs    11/15/21 1020 02/14/22 1011 05/21/22 1014  NA 136 138 136  K 4.2 4.0 3.8  CL 106 104 104  CO2 26 25 25   GLUCOSE 113* 153* 151*  BUN 14 12 13   CREATININE 0.76 0.75 0.81  CALCIUM 9.2 8.8* 9.0  GFRNONAA >60 >60 >60  PROT 7.6 7.3 7.5  ALBUMIN 4.0 3.8 4.0  AST 34 35 35  ALT 45* 42 41  ALKPHOS 99 97 102  BILITOT 0.5 0.5 0.6    Iron/TIBC/Ferritin/ %Sat    Component Value Date/Time   IRON 121 07/17/2020 1000   TIBC 384 07/17/2020 1000    FERRITIN 299 07/17/2020 1000   IRONPCTSAT 32 07/17/2020 1000      RADIOGRAPHIC STUDIES: I have personally reviewed the radiological images as listed and agreed with the findings in the report. No results found.

## 2022-05-21 NOTE — Assessment & Plan Note (Signed)
Continue port flush every 8 weeks. 

## 2022-05-21 NOTE — Assessment & Plan Note (Signed)
Chemotherapy plan as above 

## 2022-05-30 NOTE — Telephone Encounter (Signed)
xT report sent to scan.

## 2022-05-31 ENCOUNTER — Encounter: Payer: Self-pay | Admitting: Oncology

## 2022-06-04 ENCOUNTER — Encounter: Payer: Self-pay | Admitting: Oncology

## 2022-06-04 NOTE — Telephone Encounter (Signed)
xR report sent to scan

## 2022-06-05 ENCOUNTER — Encounter: Payer: Self-pay | Admitting: Oncology

## 2022-06-07 ENCOUNTER — Encounter: Payer: Self-pay | Admitting: Oncology

## 2022-06-28 ENCOUNTER — Inpatient Hospital Stay: Payer: Self-pay | Attending: Oncology

## 2022-06-28 VITALS — BP 121/82

## 2022-06-28 DIAGNOSIS — C7951 Secondary malignant neoplasm of bone: Secondary | ICD-10-CM | POA: Insufficient documentation

## 2022-06-28 DIAGNOSIS — Z452 Encounter for adjustment and management of vascular access device: Secondary | ICD-10-CM | POA: Insufficient documentation

## 2022-06-28 DIAGNOSIS — C61 Malignant neoplasm of prostate: Secondary | ICD-10-CM | POA: Insufficient documentation

## 2022-06-28 DIAGNOSIS — Z95828 Presence of other vascular implants and grafts: Secondary | ICD-10-CM

## 2022-06-28 MED ORDER — HEPARIN SOD (PORK) LOCK FLUSH 100 UNIT/ML IV SOLN
500.0000 [IU] | Freq: Once | INTRAVENOUS | Status: AC
  Administered 2022-06-28: 500 [IU] via INTRAVENOUS
  Filled 2022-06-28: qty 5

## 2022-06-28 MED ORDER — SODIUM CHLORIDE 0.9% FLUSH
10.0000 mL | Freq: Once | INTRAVENOUS | Status: AC
  Administered 2022-06-28: 10 mL via INTRAVENOUS
  Filled 2022-06-28: qty 10

## 2022-07-24 ENCOUNTER — Other Ambulatory Visit: Payer: Self-pay | Admitting: *Deleted

## 2022-07-24 DIAGNOSIS — C61 Malignant neoplasm of prostate: Secondary | ICD-10-CM

## 2022-07-24 MED ORDER — DAROLUTAMIDE 300 MG PO TABS
600.0000 mg | ORAL_TABLET | Freq: Two times a day (BID) | ORAL | 3 refills | Status: DC
Start: 2022-07-24 — End: 2022-08-21

## 2022-08-09 ENCOUNTER — Inpatient Hospital Stay: Payer: Self-pay

## 2022-08-16 ENCOUNTER — Inpatient Hospital Stay: Payer: Self-pay | Attending: Oncology

## 2022-08-16 DIAGNOSIS — Z79899 Other long term (current) drug therapy: Secondary | ICD-10-CM | POA: Insufficient documentation

## 2022-08-16 DIAGNOSIS — Z5111 Encounter for antineoplastic chemotherapy: Secondary | ICD-10-CM | POA: Insufficient documentation

## 2022-08-16 DIAGNOSIS — C61 Malignant neoplasm of prostate: Secondary | ICD-10-CM | POA: Insufficient documentation

## 2022-08-16 DIAGNOSIS — Z79818 Long term (current) use of other agents affecting estrogen receptors and estrogen levels: Secondary | ICD-10-CM | POA: Insufficient documentation

## 2022-08-16 DIAGNOSIS — C7951 Secondary malignant neoplasm of bone: Secondary | ICD-10-CM | POA: Insufficient documentation

## 2022-08-16 DIAGNOSIS — R232 Flushing: Secondary | ICD-10-CM | POA: Insufficient documentation

## 2022-08-16 DIAGNOSIS — Z87891 Personal history of nicotine dependence: Secondary | ICD-10-CM | POA: Insufficient documentation

## 2022-08-16 LAB — CBC WITH DIFFERENTIAL (CANCER CENTER ONLY)
Abs Immature Granulocytes: 0.01 10*3/uL (ref 0.00–0.07)
Basophils Absolute: 0.1 10*3/uL (ref 0.0–0.1)
Basophils Relative: 1 %
Eosinophils Absolute: 0.3 10*3/uL (ref 0.0–0.5)
Eosinophils Relative: 4 %
HCT: 37.9 % — ABNORMAL LOW (ref 39.0–52.0)
Hemoglobin: 12.8 g/dL — ABNORMAL LOW (ref 13.0–17.0)
Immature Granulocytes: 0 %
Lymphocytes Relative: 33 %
Lymphs Abs: 2 10*3/uL (ref 0.7–4.0)
MCH: 30 pg (ref 26.0–34.0)
MCHC: 33.8 g/dL (ref 30.0–36.0)
MCV: 88.8 fL (ref 80.0–100.0)
Monocytes Absolute: 0.6 10*3/uL (ref 0.1–1.0)
Monocytes Relative: 11 %
Neutro Abs: 3.1 10*3/uL (ref 1.7–7.7)
Neutrophils Relative %: 51 %
Platelet Count: 217 10*3/uL (ref 150–400)
RBC: 4.27 MIL/uL (ref 4.22–5.81)
RDW: 13.7 % (ref 11.5–15.5)
WBC Count: 6.1 10*3/uL (ref 4.0–10.5)
nRBC: 0 % (ref 0.0–0.2)

## 2022-08-16 LAB — CMP (CANCER CENTER ONLY)
ALT: 40 U/L (ref 0–44)
AST: 32 U/L (ref 15–41)
Albumin: 3.9 g/dL (ref 3.5–5.0)
Alkaline Phosphatase: 101 U/L (ref 38–126)
Anion gap: 9 (ref 5–15)
BUN: 20 mg/dL (ref 8–23)
CO2: 25 mmol/L (ref 22–32)
Calcium: 9.1 mg/dL (ref 8.9–10.3)
Chloride: 104 mmol/L (ref 98–111)
Creatinine: 0.85 mg/dL (ref 0.61–1.24)
GFR, Estimated: >60 mL/min (ref >60)
Glucose, Bld: 117 mg/dL — ABNORMAL HIGH (ref 70–99)
Potassium: 4 mmol/L (ref 3.5–5.1)
Sodium: 138 mmol/L (ref 135–145)
Total Bilirubin: 0.6 mg/dL (ref 0.3–1.2)
Total Protein: 7.4 g/dL (ref 6.5–8.1)

## 2022-08-16 LAB — PSA: Prostatic Specific Antigen: 0.97 ng/mL (ref 0.00–4.00)

## 2022-08-21 ENCOUNTER — Encounter: Payer: Self-pay | Admitting: Oncology

## 2022-08-21 ENCOUNTER — Inpatient Hospital Stay (HOSPITAL_BASED_OUTPATIENT_CLINIC_OR_DEPARTMENT_OTHER): Payer: Self-pay | Admitting: Oncology

## 2022-08-21 ENCOUNTER — Inpatient Hospital Stay: Payer: Self-pay

## 2022-08-21 VITALS — BP 111/69 | HR 81 | Temp 97.1°F | Resp 18 | Wt 188.7 lb

## 2022-08-21 DIAGNOSIS — C61 Malignant neoplasm of prostate: Secondary | ICD-10-CM

## 2022-08-21 DIAGNOSIS — R232 Flushing: Secondary | ICD-10-CM

## 2022-08-21 DIAGNOSIS — Z95828 Presence of other vascular implants and grafts: Secondary | ICD-10-CM

## 2022-08-21 DIAGNOSIS — Z5111 Encounter for antineoplastic chemotherapy: Secondary | ICD-10-CM

## 2022-08-21 MED ORDER — DAROLUTAMIDE 300 MG PO TABS
600.0000 mg | ORAL_TABLET | Freq: Two times a day (BID) | ORAL | 3 refills | Status: DC
Start: 2022-08-21 — End: 2022-10-30

## 2022-08-21 MED ORDER — LEUPROLIDE ACETATE (6 MONTH) 45 MG ~~LOC~~ KIT
45.0000 mg | PACK | Freq: Once | SUBCUTANEOUS | Status: AC
  Administered 2022-08-21: 45 mg via SUBCUTANEOUS
  Filled 2022-08-21: qty 45

## 2022-08-21 MED ORDER — VENLAFAXINE HCL 37.5 MG PO TABS: 37.5000 mg | ORAL_TABLET | Freq: Every day | ORAL | 1 refills | Status: DC

## 2022-08-21 NOTE — Assessment & Plan Note (Signed)
Chemotherapy plan as above 

## 2022-08-21 NOTE — Assessment & Plan Note (Addendum)
Labs reviewed and discussed with patient.   PSA has been stable, slight increase. Close monitor  Continue androgen deprivation therapy-Eligard 45 mg every 6 months.-Patient will receive today. Continue darolutamide 600 mg twice daily Check NGS molecular studies.-[facility] NGS showed T p53, TMB 5.3, MSI stable. Patient declined genetic testing.

## 2022-08-21 NOTE — Assessment & Plan Note (Addendum)
Continue port flush every 6-8 weeks.  

## 2022-08-21 NOTE — Progress Notes (Signed)
Hematology/Oncology Progress note Telephone:(336) [phone] Fax:(336) [phone]      CHIEF COMPLAINTS/REASON FOR VISIT:  Follow-up for prostate cancer treatment   ASSESSMENT & PLAN:   Cancer Staging  Prostate cancer [facility]) Staging form: Prostate, AJCC 8th Edition - Clinical stage from 06/23/2020: Stage IVB (cT2c, cNX, cM1b, PSA: 678, Grade Group: 5) - Signed by Rickard Patience, MD on 06/23/2020   Prostate cancer [facility]) Labs reviewed and discussed with patient.   PSA has been stable, slight increase. Close monitor  Continue androgen deprivation therapy-Eligard 45 mg every 6 months.-Patient will receive today. Continue darolutamide 600 mg twice daily Check NGS molecular studies.-[facility] NGS showed T p53, TMB 5.3, MSI stable. Patient declined genetic testing.    Encounter for antineoplastic chemotherapy Chemotherapy plan as above  Port-A-Cath in place Continue port flush every 8 weeks  Vasomotor flushing Hot flash due to ADT, continue Effexor 37.5mg  daily.  Orders Placed This Encounter  Procedures   CBC with Differential ([facility] Only)    Standing Status:   Future    Standing Expiration Date:   08/21/2023   CMP ([facility] only)    Standing Status:   Future    Standing Expiration Date:   08/21/2023   PSA    Standing Status:   Future    Standing Expiration Date:   08/21/2023   Follow up in 3 months. Lab MD   All questions were answered. The patient knows to call the clinic with any problems, questions or concerns.  Rickard Patience, MD, PhD [facility] Hematology Oncology 08/21/2022    HISTORY OF PRESENTING ILLNESS:  05/27/2020 patient presented to emergency room for evaluation of generalized weakness and and body pain.  Prior to the presentations, patient also had history of tooth abscess and was prescribed antibiotics. Reports acute on chronic right-sided lumbar pain without any prior trauma history.  Profound weakness.  Decreased appetite and loss of weight about 10 pounds  during the past few months.  Also complained intermittent right upper quadrant discomfort  326 04/10/2020 CT abdomen pelvis showed a diffusely patchy sclerotic appearance of the osseous structures suspicious for osseous metastatic disease.  Prostate gland is heterogeneous in appearance.  Correlate with PSA.  Patient had a mild superior endplate compression deformity of L4 with less than 10% vertebral body height loss.  H indeterminate.  Fusiform infrarenal abdominal aortic aneurysm measuring up to 3.5 cm.  Follow-up in 2 years. Suspicious findings for early acute uncomplicated appendicitis.  Patient denies any right lower quadrant pain.  In the emergency room, he has a negative Murphy sign.  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  Highest Gleason score 5+4 Postprocedure, patient has had hematuria and hematochezia, bilateral lower extremity pain, AKI orthostatic hypovolemia, sepsis and was admitted and treated.  Patient was discharged on a course of antibiotics.  06/18/2020 MRI thoracic, lumbar, sacrum is compatible with diffuse osseous metastasis in the visualized thoracic lumbar spine, sacrum and bilateral iliac bones.  No definitive dural based tumor is identified.  No compression fracture.  Spine spondylosis  06/20/2019, patient received loading dose of Firmagon 240 mg x 1. #06/22/2020, bone scan showed widespread osseous metastatic disease involving the appendicular and axial skeleton #April 2022. COVID infection. #07/10/2020, Dr. Wyn Quaker placed Mediport #07/17/2020-10/30/2020 6 cycles of docetaxel.   #03/06/2021, started on darolutamide  [ARASENS trial, the combination of darolutamide plus docetaxel is an approved option for treatment of metastatic CSPC- Improved OS, time to develop  castration resistant disease].   #INTERVAL HISTORY NASARIO MASEK is a 62 y.o. male who has above history reviewed by me today presents for follow up  visit for management of metastatic prostate cancer, Patient continues on darolutamide 600 mg twice daily.  Chronic back pain after work. + hot flash improved after taking Effexor.  Patient tolerates treatments, patient denies any new complaints today.  Review of Systems  Constitutional:  Positive for fatigue. Negative for appetite change, chills, fever and unexpected weight change.       Patient walks independently  HENT:   Negative for hearing loss and voice change.   Eyes:  Negative for eye problems and icterus.  Respiratory:  Negative for chest tightness, cough and shortness of breath.   Cardiovascular:  Negative for chest pain and leg swelling.  Gastrointestinal:  Negative for abdominal distention and abdominal pain.  Endocrine: Positive for hot flashes.  Genitourinary:  Negative for difficulty urinating, dysuria and frequency.   Musculoskeletal:  Positive for back pain. Negative for arthralgias.  Skin:  Negative for itching and rash.  Neurological:  Negative for light-headedness and numbness.  Hematological:  Negative for adenopathy. Does not bruise/bleed easily.  Psychiatric/Behavioral:  Negative for confusion.     MEDICAL HISTORY:  Past Medical History:  Diagnosis Date   Prostate cancer ([facility])    2022    SURGICAL HISTORY: Past Surgical History:  Procedure Laterality Date   PORTA CATH INSERTION N/A 07/10/2020   Procedure: PORTA CATH INSERTION;  Surgeon: Annice Needy, MD;  Location: [facility] INVASIVE CV LAB;  Service: Cardiovascular;  Laterality: N/A;   PROSTATE BIOPSY N/A 06/16/2020   Procedure: PROSTATE BIOPSY;  Surgeon: Sondra Come, MD;  Location: [facility] ORS;  Service: Urology;  Laterality: N/A;   SHOULDER SURGERY     1985   TRANSRECTAL ULTRASOUND N/A 06/16/2020   Procedure: TRANSRECTAL ULTRASOUND;  Surgeon: Sondra Come, MD;  Location: [facility] ORS;  Service: Urology;  Laterality: N/A;    SOCIAL HISTORY: Social History   Socioeconomic History   Marital status: Married     Spouse name: Not on file   Number of children: Not on file   Years of education: Not on file   Highest education level: Not on file  Occupational History   Occupation: land scaper    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, 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  Tobacco Use   Smoking status: Former    Packs/day: 1.00    Years: 45.00    Additional pack years: 0.00    Total pack years: 45.00    Types: Cigarettes    Quit date: 07/10/2022    Years since quitting: 0.1   Smokeless tobacco: Never  Substance and Sexual Activity   Alcohol use: Not Currently   Drug use: Never   Sexual activity: Not Currently  Other Topics Concern   Not on file  Social History Narrative   Not on file   Social Determinants of Health   Financial Resource Strain: Not on file  Food Insecurity: Not on file  Transportation Needs: Not on file  Physical Activity: Not on file  Stress: Not on file  Social Connections: Not on file  Intimate Partner Violence: Not on file    FAMILY HISTORY: Family History  Problem Relation Age of Onset   COPD Mother    Heart attack Father     ALLERGIES:  has No Known Allergies.  MEDICATIONS:  Current Outpatient Medications  Medication Sig Dispense Refill   acetaminophen (TYLENOL) 325 MG tablet Take 650  mg by mouth every 6 (six) hours as needed for moderate pain or mild pain.     Calcium 600-400 MG-UNIT CHEW Chew 2 tablets by mouth daily. 60 tablet 3   Multiple Vitamins-Minerals (MULTIVITAMIN WITH MINERALS) tablet Take 1 tablet by mouth daily.     nicotine (NICODERM CQ - DOSED IN MG/24 HOURS) 14 mg/24hr patch PLACE 1 PATCH ONTO THE SKIN DAILY 30 patch 0   darolutamide (NUBEQA) 300 MG tablet Take 2 tablets (600 mg total) by mouth 2 (two) times daily with a meal. 120 tablet 3   venlafaxine (EFFEXOR) 37.5 MG tablet Take 1 tablet (37.5 mg total) by mouth daily. 90 tablet 1   No current facility-administered medications for this visit.   Facility-Administered Medications Ordered in Other Visits   Medication Dose Route Frequency Provider Last Rate Last Admin   heparin lock flush 100 UNIT/ML injection              PHYSICAL EXAMINATION: ECOG PERFORMANCE STATUS: 0 - Asymptomatic Vitals:   08/21/22 1316  BP: 111/69  Pulse: 81  Resp: 18  Temp: (!) 97.1 F (36.2 C)  SpO2: 95%   Filed Weights   08/21/22 1316  Weight: 188 lb 11.2 oz (85.6 kg)    Physical Exam Constitutional:      General: He is not in acute distress. HENT:     Head: Normocephalic and atraumatic.  Eyes:     General: No scleral icterus. Cardiovascular:     Rate and Rhythm: Normal rate and regular rhythm.     Heart sounds: Normal heart sounds.  Pulmonary:     Effort: Pulmonary effort is normal. No respiratory distress.     Breath sounds: No wheezing.  Abdominal:     General: Bowel sounds are normal. There is no distension.     Palpations: Abdomen is soft.  Musculoskeletal:        General: No deformity. Normal range of motion.     Cervical back: Normal range of motion and neck supple.  Skin:    General: Skin is warm and dry.     Findings: No erythema or rash.  Neurological:     Mental Status: He is alert and oriented to person, place, and time. Mental status is at baseline.     Cranial Nerves: No cranial nerve deficit.     Coordination: Coordination normal.  Psychiatric:        Mood and Affect: Mood normal.     LABORATORY DATA:  I have reviewed the data as listed Lab Results  Component Value Date   WBC 6.1 08/16/2022   HGB 12.8 (L) 08/16/2022   HCT 37.9 (L) 08/16/2022   MCV 88.8 08/16/2022   PLT 217 08/16/2022   Recent Labs    02/14/22 1011 05/21/22 1014 08/16/22 0906  NA 138 136 138  K 4.0 3.8 4.0  CL 104 104 104  CO2 25 25 25   GLUCOSE 153* 151* 117*  BUN 12 13 20   CREATININE 0.75 0.81 0.85  CALCIUM 8.8* 9.0 9.1  GFRNONAA >60 >60 >60  PROT 7.3 7.5 7.4  ALBUMIN 3.8 4.0 3.9  AST 35 35 32  ALT 42 41 40  ALKPHOS 97 102 101  BILITOT 0.5 0.6 0.6    Iron/TIBC/Ferritin/ %Sat     Component Value Date/Time   IRON 121 07/17/2020 1000   TIBC 384 07/17/2020 1000   FERRITIN 299 07/17/2020 1000   IRONPCTSAT 32 07/17/2020 1000      RADIOGRAPHIC STUDIES: I have personally  reviewed the radiological images as listed and agreed with the findings in the report. No results found.

## 2022-08-21 NOTE — Assessment & Plan Note (Signed)
Hot flash due to ADT, continue Effexor 37.5mg daily. 

## 2022-09-20 ENCOUNTER — Inpatient Hospital Stay: Payer: Self-pay

## 2022-10-08 ENCOUNTER — Inpatient Hospital Stay: Payer: Self-pay | Attending: Oncology

## 2022-10-08 DIAGNOSIS — C61 Malignant neoplasm of prostate: Secondary | ICD-10-CM | POA: Insufficient documentation

## 2022-10-08 DIAGNOSIS — Z95828 Presence of other vascular implants and grafts: Secondary | ICD-10-CM

## 2022-10-08 DIAGNOSIS — Z452 Encounter for adjustment and management of vascular access device: Secondary | ICD-10-CM | POA: Insufficient documentation

## 2022-10-08 DIAGNOSIS — C7951 Secondary malignant neoplasm of bone: Secondary | ICD-10-CM | POA: Insufficient documentation

## 2022-10-08 MED ORDER — HEPARIN SOD (PORK) LOCK FLUSH 100 UNIT/ML IV SOLN
500.0000 [IU] | Freq: Once | INTRAVENOUS | Status: AC
  Administered 2022-10-08: 500 [IU] via INTRAVENOUS
  Filled 2022-10-08: qty 5

## 2022-10-08 MED ORDER — SODIUM CHLORIDE 0.9% FLUSH
10.0000 mL | INTRAVENOUS | Status: DC | PRN
  Administered 2022-10-08: 10 mL via INTRAVENOUS
  Filled 2022-10-08: qty 10

## 2022-10-30 ENCOUNTER — Other Ambulatory Visit: Payer: Self-pay | Admitting: Pharmacist

## 2022-10-30 DIAGNOSIS — C61 Malignant neoplasm of prostate: Secondary | ICD-10-CM

## 2022-10-30 MED ORDER — DAROLUTAMIDE 300 MG PO TABS
600.0000 mg | ORAL_TABLET | Freq: Two times a day (BID) | ORAL | 2 refills | Status: DC
Start: 2022-10-30 — End: 2023-02-04

## 2022-11-25 ENCOUNTER — Other Ambulatory Visit: Payer: Self-pay

## 2022-11-25 ENCOUNTER — Inpatient Hospital Stay: Payer: Self-pay

## 2022-11-27 ENCOUNTER — Inpatient Hospital Stay: Payer: Self-pay | Admitting: Oncology

## 2022-11-27 ENCOUNTER — Ambulatory Visit: Payer: Self-pay | Admitting: Oncology

## 2022-12-02 ENCOUNTER — Inpatient Hospital Stay: Payer: Self-pay | Attending: Oncology

## 2022-12-02 DIAGNOSIS — C7951 Secondary malignant neoplasm of bone: Secondary | ICD-10-CM | POA: Insufficient documentation

## 2022-12-02 DIAGNOSIS — Z95828 Presence of other vascular implants and grafts: Secondary | ICD-10-CM

## 2022-12-02 DIAGNOSIS — C61 Malignant neoplasm of prostate: Secondary | ICD-10-CM | POA: Insufficient documentation

## 2022-12-02 DIAGNOSIS — Z452 Encounter for adjustment and management of vascular access device: Secondary | ICD-10-CM | POA: Insufficient documentation

## 2022-12-02 LAB — CBC WITH DIFFERENTIAL (CANCER CENTER ONLY)
Abs Immature Granulocytes: 0.02 10*3/uL (ref 0.00–0.07)
Basophils Absolute: 0.1 10*3/uL (ref 0.0–0.1)
Basophils Relative: 1 %
Eosinophils Absolute: 0.4 10*3/uL (ref 0.0–0.5)
Eosinophils Relative: 5 %
HCT: 36.9 % — ABNORMAL LOW (ref 39.0–52.0)
Hemoglobin: 12.3 g/dL — ABNORMAL LOW (ref 13.0–17.0)
Immature Granulocytes: 0 %
Lymphocytes Relative: 32 %
Lymphs Abs: 2.3 10*3/uL (ref 0.7–4.0)
MCH: 30.1 pg (ref 26.0–34.0)
MCHC: 33.3 g/dL (ref 30.0–36.0)
MCV: 90.4 fL (ref 80.0–100.0)
Monocytes Absolute: 0.6 10*3/uL (ref 0.1–1.0)
Monocytes Relative: 9 %
Neutro Abs: 3.6 10*3/uL (ref 1.7–7.7)
Neutrophils Relative %: 53 %
Platelet Count: 239 10*3/uL (ref 150–400)
RBC: 4.08 MIL/uL — ABNORMAL LOW (ref 4.22–5.81)
RDW: 13.4 % (ref 11.5–15.5)
WBC Count: 7 10*3/uL (ref 4.0–10.5)
nRBC: 0 % (ref 0.0–0.2)

## 2022-12-02 LAB — CMP (CANCER CENTER ONLY)
ALT: 34 U/L (ref 0–44)
AST: 32 U/L (ref 15–41)
Albumin: 3.7 g/dL (ref 3.5–5.0)
Alkaline Phosphatase: 100 U/L (ref 38–126)
Anion gap: 7 (ref 5–15)
BUN: 16 mg/dL (ref 8–23)
CO2: 25 mmol/L (ref 22–32)
Calcium: 9.1 mg/dL (ref 8.9–10.3)
Chloride: 103 mmol/L (ref 98–111)
Creatinine: 0.74 mg/dL (ref 0.61–1.24)
GFR, Estimated: >60 mL/min (ref >60)
Glucose, Bld: 136 mg/dL — ABNORMAL HIGH (ref 70–99)
Potassium: 3.9 mmol/L (ref 3.5–5.1)
Sodium: 135 mmol/L (ref 135–145)
Total Bilirubin: 0.7 mg/dL (ref 0.3–1.2)
Total Protein: 7.5 g/dL (ref 6.5–8.1)

## 2022-12-02 LAB — PSA: Prostatic Specific Antigen: 1.7 ng/mL (ref 0.00–4.00)

## 2022-12-02 MED ORDER — SODIUM CHLORIDE 0.9% FLUSH
10.0000 mL | Freq: Once | INTRAVENOUS | Status: AC
  Administered 2022-12-02: 10 mL via INTRAVENOUS
  Filled 2022-12-02: qty 10

## 2022-12-02 MED ORDER — HEPARIN SOD (PORK) LOCK FLUSH 100 UNIT/ML IV SOLN
500.0000 [IU] | Freq: Once | INTRAVENOUS | Status: AC
  Administered 2022-12-02: 500 [IU] via INTRAVENOUS
  Filled 2022-12-02: qty 5

## 2022-12-04 ENCOUNTER — Inpatient Hospital Stay: Payer: Self-pay | Attending: Oncology | Admitting: Oncology

## 2022-12-04 ENCOUNTER — Encounter: Payer: Self-pay | Admitting: Oncology

## 2022-12-04 VITALS — BP 125/71 | HR 60 | Temp 95.0°F | Resp 18 | Wt 183.5 lb

## 2022-12-04 DIAGNOSIS — Z5111 Encounter for antineoplastic chemotherapy: Secondary | ICD-10-CM

## 2022-12-04 DIAGNOSIS — Z87891 Personal history of nicotine dependence: Secondary | ICD-10-CM | POA: Insufficient documentation

## 2022-12-04 DIAGNOSIS — Z95828 Presence of other vascular implants and grafts: Secondary | ICD-10-CM

## 2022-12-04 DIAGNOSIS — Z720 Tobacco use: Secondary | ICD-10-CM

## 2022-12-04 DIAGNOSIS — Z79899 Other long term (current) drug therapy: Secondary | ICD-10-CM | POA: Insufficient documentation

## 2022-12-04 DIAGNOSIS — C7951 Secondary malignant neoplasm of bone: Secondary | ICD-10-CM | POA: Insufficient documentation

## 2022-12-04 DIAGNOSIS — C61 Malignant neoplasm of prostate: Secondary | ICD-10-CM | POA: Insufficient documentation

## 2022-12-04 MED ORDER — VENLAFAXINE HCL 37.5 MG PO TABS: 37.5000 mg | ORAL_TABLET | Freq: Every day | ORAL | 1 refills | Status: DC

## 2022-12-04 NOTE — Assessment & Plan Note (Signed)
Continue port flush every 6-8 weeks.  

## 2022-12-04 NOTE — Assessment & Plan Note (Addendum)
Metastatic prostate cancer with bone metastasis,  castration sensitive, s/p Docetaxel x 6 cycles, followed by Darolutamide.  Labs reviewed and discussed with patient.   PSA has been stable, gradually increasing. Close monitor  Continue androgen deprivation therapy-Eligard 45 mg every 6 months.-next dose Dec 2024 Continue darolutamide 600 mg twice daily Check NGS molecular studies.-[facility] NGS showed Tp53, TMB 5.3, MSI stable. Patient declined genetic testing.

## 2022-12-04 NOTE — Assessment & Plan Note (Signed)
Chemotherapy plan as above 

## 2022-12-04 NOTE — Assessment & Plan Note (Signed)
Smoke cessation discussed Recommend him to establish with PCP CT chest lung cancer screening is up-to-date.

## 2022-12-04 NOTE — Progress Notes (Signed)
Hematology/Oncology Progress note [phone] [phone]      CHIEF COMPLAINTS/REASON FOR VISIT:  Follow-up for prostate cancer treatment   ASSESSMENT & PLAN:   Cancer Staging  Prostate cancer [facility]) Staging form: Prostate, AJCC 8th Edition - Clinical stage from 06/23/2020: Stage IVB (cT2c, cNX, cM1b, PSA: 678, Grade Group: 5) - Signed by Rickard Patience, MD on 06/23/2020   Prostate cancer [facility]) Metastatic prostate cancer with bone metastasis,  castration sensitive, s/p Docetaxel x 6 cycles, followed by Darolutamide.  Labs reviewed and discussed with patient.   PSA has been stable, gradually increasing. Close monitor  Continue androgen deprivation therapy-Eligard 45 mg every 6 months.-next dose Dec 2024 Continue darolutamide 600 mg twice daily Check NGS molecular studies.-[facility] NGS showed Tp53, TMB 5.3, MSI stable. Patient declined genetic testing.  Tobacco use Smoke cessation discussed Recommend him to establish with PCP CT chest lung cancer screening is up-to-date.  Port-A-Cath in place Continue port flush every 6-8 weeks  Encounter for antineoplastic chemotherapy Chemotherapy plan as above  Orders Placed This Encounter  Procedures   CT CHEST LUNG CA SCREEN LOW DOSE W/O CM    Standing Status:   Future    Standing Expiration Date:   12/04/2023    Order Specific Question:   Preferred Imaging Location?    Answer:   [facility]   CBC with Differential (Cancer Center Only)    Standing Status:   Future    Standing Expiration Date:   12/04/2023   CMP (Cancer Center only)    Standing Status:   Future    Standing Expiration Date:   12/04/2023   Follow up in 3 months. Lab MD   All questions were answered. The patient knows to call the clinic with any problems, questions or concerns.  Rickard Patience, MD, PhD [facility] Hematology Oncology 12/04/2022    HISTORY OF PRESENTING ILLNESS:  05/27/2020 patient presented to emergency room for evaluation of  generalized weakness and and body pain.  Prior to the presentations, patient also had history of tooth abscess and was prescribed antibiotics. Reports acute on chronic right-sided lumbar pain without any prior trauma history.  Profound weakness.  Decreased appetite and loss of weight about 10 pounds during the past few months.  Also complained intermittent right upper quadrant discomfort  326 04/10/2020 CT abdomen pelvis showed a diffusely patchy sclerotic appearance of the osseous structures suspicious for osseous metastatic disease.  Prostate gland is heterogeneous in appearance.  Correlate with PSA.  Patient had a mild superior endplate compression deformity of L4 with less than 10% vertebral body height loss.  H indeterminate.  Fusiform infrarenal abdominal aortic aneurysm measuring up to 3.5 cm.  Follow-up in 2 years. Suspicious findings for early acute uncomplicated appendicitis.  Patient denies any right lower quadrant pain.  In the emergency room, he has a negative Murphy sign.  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  Highest Gleason score 5+4 Postprocedure, patient has had hematuria and hematochezia, bilateral lower extremity pain, AKI orthostatic hypovolemia, sepsis and was admitted and treated.  Patient was discharged on a course of antibiotics.  06/18/2020 MRI thoracic, lumbar, sacrum is compatible with diffuse osseous metastasis in the visualized thoracic lumbar spine, sacrum and bilateral iliac bones.  No definitive dural based tumor is identified.  No compression fracture.  Spine spondylosis  06/19/2020, patient received loading dose of Firmagon 240 mg x 1. #06/22/2020, bone scan showed widespread osseous metastatic disease  involving the appendicular and axial skeleton #April 2022. COVID infection. #07/10/2020, Dr. Wyn Quaker placed Mediport #07/17/2020-10/30/2020 6 cycles of docetaxel.   #03/06/2021, started on darolutamide   [ARASENS trial, the combination of darolutamide plus docetaxel is an approved option for treatment of metastatic CSPC- Improved OS, time to develop castration resistant disease].   #INTERVAL HISTORY Cody Vaughan is a 62 y.o. male who has above history reviewed by me today presents for follow up visit for management of metastatic prostate cancer, Patient continues on darolutamide 600 mg twice daily.  Chronic back pain after work. + hot flash improved after taking Effexor.  Patient tolerates treatments, patient denies any new complaints today.  Review of Systems  Constitutional:  Positive for fatigue. Negative for appetite change, chills, fever and unexpected weight change.       Patient walks independently  HENT:   Negative for hearing loss and voice change.   Eyes:  Negative for eye problems and icterus.  Respiratory:  Negative for chest tightness, cough and shortness of breath.   Cardiovascular:  Negative for chest pain and leg swelling.  Gastrointestinal:  Negative for abdominal distention and abdominal pain.  Endocrine: Positive for hot flashes.  Genitourinary:  Negative for difficulty urinating, dysuria and frequency.   Musculoskeletal:  Positive for back pain. Negative for arthralgias.  Skin:  Negative for itching and rash.  Neurological:  Negative for light-headedness and numbness.  Hematological:  Negative for adenopathy. Does not bruise/bleed easily.  Psychiatric/Behavioral:  Negative for confusion.     MEDICAL HISTORY:  Past Medical History:  Diagnosis Date   Prostate cancer (HCC)    2022    SURGICAL HISTORY: Past Surgical History:  Procedure Laterality Date   PORTA CATH INSERTION N/A 07/10/2020   Procedure: PORTA CATH INSERTION;  Surgeon: Annice Needy, MD;  Location: [facility] INVASIVE CV LAB;  Service: Cardiovascular;  Laterality: N/A;   PROSTATE BIOPSY N/A 06/16/2020   Procedure: PROSTATE BIOPSY;  Surgeon: Sondra Come, MD;  Location: [facility] ORS;  Service: Urology;   Laterality: N/A;   SHOULDER SURGERY     1985   TRANSRECTAL ULTRASOUND N/A 06/16/2020   Procedure: TRANSRECTAL ULTRASOUND;  Surgeon: Sondra Come, MD;  Location: [facility] ORS;  Service: Urology;  Laterality: N/A;    SOCIAL HISTORY: Social History   Socioeconomic History   Marital status: Married    Spouse name: Not on file   Number of children: Not on file   Years of education: Not on file   Highest education level: Not on file  Occupational History   Occupation: land scaper    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, 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  Tobacco Use   Smoking status: Former    Current packs/day: 0.00    Average packs/day: 1 pack/day for 45.0 years (45.0 ttl pk-yrs)    Types: Cigarettes    Start date: 07/09/1977    Quit date: 07/10/2022    Years since quitting: 0.4   Smokeless tobacco: Never  Substance and Sexual Activity   Alcohol use: Not Currently   Drug use: Never   Sexual activity: Not Currently  Other Topics Concern   Not on file  Social History Narrative   Not on file   Social Determinants of Health   Financial Resource Strain: Not on file  Food Insecurity: Not on file  Transportation Needs: Not on file  Physical Activity: Not on file  Stress: Not on file  Social Connections: Not on file  Intimate Partner Violence: Not on file  FAMILY HISTORY: Family History  Problem Relation Age of Onset   COPD Mother    Heart attack Father     ALLERGIES:  has No Known Allergies.  MEDICATIONS:  Current Outpatient Medications  Medication Sig Dispense Refill   acetaminophen (TYLENOL) 325 MG tablet Take 650 mg by mouth every 6 (six) hours as needed for moderate pain or mild pain.     Calcium 600-400 MG-UNIT CHEW Chew 2 tablets by mouth daily. 60 tablet 3   darolutamide (NUBEQA) 300 MG tablet Take 2 tablets (600 mg total) by mouth 2 (two) times daily with a meal. 120 tablet 2   Multiple Vitamins-Minerals (MULTIVITAMIN WITH MINERALS) tablet Take 1 tablet by mouth daily.     nicotine  (NICODERM CQ - DOSED IN MG/24 HOURS) 14 mg/24hr patch PLACE 1 PATCH ONTO THE SKIN DAILY 30 patch 0   venlafaxine (EFFEXOR) 37.5 MG tablet Take 1 tablet (37.5 mg total) by mouth daily. 90 tablet 1   No current facility-administered medications for this visit.   Facility-Administered Medications Ordered in Other Visits  Medication Dose Route Frequency Provider Last Rate Last Admin   heparin lock flush 100 UNIT/ML injection              PHYSICAL EXAMINATION: ECOG PERFORMANCE STATUS: 0 - Asymptomatic Vitals:   12/04/22 1038  BP: 125/71  Pulse: 60  Resp: 18  Temp: (!) 95 F (35 C)  SpO2: 100%   Filed Weights   12/04/22 1038  Weight: 183 lb 8 oz (83.2 kg)    Physical Exam Constitutional:      General: He is not in acute distress. HENT:     Head: Normocephalic and atraumatic.  Eyes:     General: No scleral icterus. Cardiovascular:     Rate and Rhythm: Normal rate and regular rhythm.     Heart sounds: Normal heart sounds.  Pulmonary:     Effort: Pulmonary effort is normal. No respiratory distress.     Breath sounds: No wheezing.  Abdominal:     General: Bowel sounds are normal. There is no distension.     Palpations: Abdomen is soft.  Musculoskeletal:        General: No deformity. Normal range of motion.     Cervical back: Normal range of motion and neck supple.  Skin:    General: Skin is warm and dry.     Findings: No erythema or rash.  Neurological:     Mental Status: He is alert and oriented to person, place, and time. Mental status is at baseline.     Cranial Nerves: No cranial nerve deficit.     Coordination: Coordination normal.  Psychiatric:        Mood and Affect: Mood normal.     LABORATORY DATA:  I have reviewed the data as listed    Latest Ref Rng & Units 12/02/2022    7:58 AM 08/16/2022    9:06 AM 05/21/2022   10:14 AM  CBC  WBC 4.0 - 10.5 K/uL 7.0  6.1  7.6   Hemoglobin 13.0 - 17.0 g/dL 16.1  09.6  04.5   Hematocrit 39.0 - 52.0 % 36.9  37.9  37.3    Platelets 150 - 400 K/uL 239  217  251       Latest Ref Rng & Units 12/02/2022    7:58 AM 08/16/2022    9:06 AM 05/21/2022   10:14 AM  CMP  Glucose 70 - 99 mg/dL 409  811  914  BUN 8 - 23 mg/dL 16  20  13    Creatinine 0.61 - 1.24 mg/dL 2.13  0.86  5.78   Sodium 135 - 145 mmol/L 135  138  136   Potassium 3.5 - 5.1 mmol/L 3.9  4.0  3.8   Chloride 98 - 111 mmol/L 103  104  104   CO2 22 - 32 mmol/L 25  25  25    Calcium 8.9 - 10.3 mg/dL 9.1  9.1  9.0   Total Protein 6.5 - 8.1 g/dL 7.5  7.4  7.5   Total Bilirubin 0.3 - 1.2 mg/dL 0.7  0.6  0.6   Alkaline Phos 38 - 126 U/L 100  101  102   AST 15 - 41 U/L 32  32  35   ALT 0 - 44 U/L 34  40  41      RADIOGRAPHIC STUDIES: I have personally reviewed the radiological images as listed and agreed with the findings in the report. No results found.

## 2022-12-18 ENCOUNTER — Ambulatory Visit
Admission: RE | Admit: 2022-12-18 | Discharge: 2022-12-18 | Disposition: A | Discharge status: Home / Self Care | Payer: Self-pay | Source: Ambulatory Visit | Attending: Oncology | Admitting: Oncology

## 2022-12-18 DIAGNOSIS — Z720 Tobacco use: Secondary | ICD-10-CM | POA: Insufficient documentation

## 2023-01-15 ENCOUNTER — Inpatient Hospital Stay: Payer: Self-pay | Attending: Oncology

## 2023-01-15 DIAGNOSIS — C61 Malignant neoplasm of prostate: Secondary | ICD-10-CM | POA: Insufficient documentation

## 2023-01-15 DIAGNOSIS — Z95828 Presence of other vascular implants and grafts: Secondary | ICD-10-CM

## 2023-01-15 DIAGNOSIS — Z452 Encounter for adjustment and management of vascular access device: Secondary | ICD-10-CM | POA: Insufficient documentation

## 2023-01-15 DIAGNOSIS — C7951 Secondary malignant neoplasm of bone: Secondary | ICD-10-CM | POA: Insufficient documentation

## 2023-01-15 MED ORDER — SODIUM CHLORIDE 0.9% FLUSH
10.0000 mL | INTRAVENOUS | Status: DC | PRN
  Administered 2023-01-15: 10 mL via INTRAVENOUS
  Filled 2023-01-15: qty 10

## 2023-01-15 MED ORDER — HEPARIN SOD (PORK) LOCK FLUSH 100 UNIT/ML IV SOLN
500.0000 [IU] | Freq: Once | INTRAVENOUS | Status: AC
  Administered 2023-01-15: 500 [IU] via INTRAVENOUS
  Filled 2023-01-15: qty 5

## 2023-01-20 ENCOUNTER — Telehealth: Payer: Self-pay

## 2023-01-20 NOTE — Telephone Encounter (Signed)
Oral Oncology Patient Advocate Encounter   Received notification that patient is due for re-enrollment for assistance for Nubeqa through [employer] [place] [employer].   Re-enrollment process has been initiated and will be submitted upon completion of necessary documents.  [employer]'s phone number [phone].   I will continue to follow until final determination.   Ardeen Fillers, CPhT Oncology Pharmacy Patient Advocate  [facility]  [phone] (phone) [phone] (fax) 01/20/2023 9:55 AM

## 2023-01-20 NOTE — Telephone Encounter (Signed)
Oral Oncology Patient Advocate Encounter  Reached out and spoke with patient regarding PAP paperwork, explained that I would send it to their preferred email via [employer].   Confirmed email address: [email or web address].    Patient expressed understanding and consent.  Will follow up once paperwork has been signed and returned.   Ardeen Fillers, CPhT Oncology Pharmacy Patient Advocate  [facility]  [phone] (phone) [phone] (fax) 01/20/2023 12:09 PM

## 2023-01-21 NOTE — Telephone Encounter (Signed)
Oral Oncology Patient Advocate Encounter   Submitted application for assistance for Nubeqa to [employer] [place] [employer].   Application submitted via e-fax to [phone]   [employer]'s phone number [phone].   I will continue to check the status until final determination.    Ardeen Fillers, CPhT Oncology Pharmacy Patient Advocate  [facility]  [phone] (phone) [phone] (fax) 01/21/2023 6:35 AM

## 2023-02-04 ENCOUNTER — Other Ambulatory Visit: Payer: Self-pay | Admitting: Pharmacist

## 2023-02-04 DIAGNOSIS — C61 Malignant neoplasm of prostate: Secondary | ICD-10-CM

## 2023-02-04 MED ORDER — DAROLUTAMIDE 300 MG PO TABS: 600.0000 mg | ORAL_TABLET | Freq: Two times a day (BID) | ORAL | 5 refills | Status: DC

## 2023-02-17 NOTE — Telephone Encounter (Addendum)
Oral Oncology Patient Advocate Encounter   Received notification re-enrollment for assistance for Nubeqa through [employer] [place] Patient [facility] has been approved. Patient may continue to receive their medication at $0 from this program.    [employer]'s phone number [phone].   Effective dates: 03/05/23 through 03/03/24.   Ardeen Fillers, CPhT Oncology Pharmacy Patient Advocate  [facility]  [phone] (phone) [phone] (fax) 02/17/2023 8:20 AM

## 2023-02-18 ENCOUNTER — Inpatient Hospital Stay: Payer: Self-pay

## 2023-02-18 DIAGNOSIS — C61 Malignant neoplasm of prostate: Secondary | ICD-10-CM | POA: Insufficient documentation

## 2023-02-18 DIAGNOSIS — Z79818 Long term (current) use of other agents affecting estrogen receptors and estrogen levels: Secondary | ICD-10-CM | POA: Insufficient documentation

## 2023-02-18 DIAGNOSIS — Z5111 Encounter for antineoplastic chemotherapy: Secondary | ICD-10-CM | POA: Insufficient documentation

## 2023-02-18 DIAGNOSIS — C7951 Secondary malignant neoplasm of bone: Secondary | ICD-10-CM | POA: Insufficient documentation

## 2023-02-18 DIAGNOSIS — Z95828 Presence of other vascular implants and grafts: Secondary | ICD-10-CM

## 2023-02-18 LAB — CBC WITH DIFFERENTIAL (CANCER CENTER ONLY)
Abs Immature Granulocytes: 0.01 10*3/uL (ref 0.00–0.07)
Basophils Absolute: 0.1 10*3/uL (ref 0.0–0.1)
Basophils Relative: 1 %
Eosinophils Absolute: 0.3 10*3/uL (ref 0.0–0.5)
Eosinophils Relative: 7 %
HCT: 36.2 % — ABNORMAL LOW (ref 39.0–52.0)
Hemoglobin: 12.3 g/dL — ABNORMAL LOW (ref 13.0–17.0)
Immature Granulocytes: 0 %
Lymphocytes Relative: 30 %
Lymphs Abs: 1.5 10*3/uL (ref 0.7–4.0)
MCH: 30.1 pg (ref 26.0–34.0)
MCHC: 34 g/dL (ref 30.0–36.0)
MCV: 88.7 fL (ref 80.0–100.0)
Monocytes Absolute: 0.6 10*3/uL (ref 0.1–1.0)
Monocytes Relative: 11 %
Neutro Abs: 2.7 10*3/uL (ref 1.7–7.7)
Neutrophils Relative %: 51 %
Platelet Count: 222 10*3/uL (ref 150–400)
RBC: 4.08 MIL/uL — ABNORMAL LOW (ref 4.22–5.81)
RDW: 13.7 % (ref 11.5–15.5)
WBC Count: 5.2 10*3/uL (ref 4.0–10.5)
nRBC: 0 % (ref 0.0–0.2)

## 2023-02-18 LAB — CMP (CANCER CENTER ONLY)
ALT: 37 U/L (ref 0–44)
AST: 28 U/L (ref 15–41)
Albumin: 3.6 g/dL (ref 3.5–5.0)
Alkaline Phosphatase: 93 U/L (ref 38–126)
Anion gap: 9 (ref 5–15)
BUN: 14 mg/dL (ref 8–23)
CO2: 26 mmol/L (ref 22–32)
Calcium: 8.9 mg/dL (ref 8.9–10.3)
Chloride: 101 mmol/L (ref 98–111)
Creatinine: 0.85 mg/dL (ref 0.61–1.24)
GFR, Estimated: >60 mL/min (ref >60)
Glucose, Bld: 139 mg/dL — ABNORMAL HIGH (ref 70–99)
Potassium: 3.8 mmol/L (ref 3.5–5.1)
Sodium: 136 mmol/L (ref 135–145)
Total Bilirubin: 0.3 mg/dL (ref <1.2)
Total Protein: 6.9 g/dL (ref 6.5–8.1)

## 2023-02-18 MED ORDER — HEPARIN SOD (PORK) LOCK FLUSH 100 UNIT/ML IV SOLN
500.0000 [IU] | Freq: Once | INTRAVENOUS | Status: AC
Start: 2023-02-18 — End: 2023-02-18
  Administered 2023-02-18: 500 [IU] via INTRAVENOUS
  Filled 2023-02-18: qty 5

## 2023-02-18 MED ORDER — SODIUM CHLORIDE 0.9% FLUSH
10.0000 mL | Freq: Once | INTRAVENOUS | Status: AC
Start: 2023-02-18 — End: 2023-02-18
  Administered 2023-02-18: 10 mL via INTRAVENOUS
  Filled 2023-02-18: qty 10

## 2023-02-20 ENCOUNTER — Encounter: Payer: Self-pay | Admitting: Oncology

## 2023-02-20 ENCOUNTER — Inpatient Hospital Stay: Payer: Self-pay

## 2023-02-20 ENCOUNTER — Inpatient Hospital Stay: Payer: Self-pay | Attending: Oncology | Admitting: Oncology

## 2023-02-20 VITALS — BP 136/74 | HR 55 | Temp 96.4°F | Resp 18 | Wt 191.7 lb

## 2023-02-20 DIAGNOSIS — Z720 Tobacco use: Secondary | ICD-10-CM

## 2023-02-20 DIAGNOSIS — C61 Malignant neoplasm of prostate: Secondary | ICD-10-CM

## 2023-02-20 DIAGNOSIS — Z95828 Presence of other vascular implants and grafts: Secondary | ICD-10-CM

## 2023-02-20 DIAGNOSIS — Z5111 Encounter for antineoplastic chemotherapy: Secondary | ICD-10-CM

## 2023-02-20 DIAGNOSIS — R232 Flushing: Secondary | ICD-10-CM

## 2023-02-20 LAB — PSA: Prostatic Specific Antigen: 2.95 ng/mL (ref 0.00–4.00)

## 2023-02-20 MED ORDER — LEUPROLIDE ACETATE (6 MONTH) 45 MG ~~LOC~~ KIT
45.0000 mg | PACK | Freq: Once | SUBCUTANEOUS | Status: AC
  Administered 2023-02-20: 45 mg via SUBCUTANEOUS
  Filled 2023-02-20: qty 45

## 2023-02-20 MED ORDER — DAROLUTAMIDE 300 MG PO TABS: 600.0000 mg | ORAL_TABLET | Freq: Two times a day (BID) | ORAL | 5 refills | Status: DC

## 2023-02-20 NOTE — Assessment & Plan Note (Signed)
Continue port flush every 6-8 weeks.  

## 2023-02-20 NOTE — Assessment & Plan Note (Addendum)
Metastatic prostate cancer with bone metastasis,  castration sensitive, s/p Docetaxel x 6 cycles, followed by Darolutamide.  Labs reviewed and discussed with patient.   PSA has been stable, gradually increasing.  Today 2.97. Continue androgen deprivation therapy-Eligard 45 mg every 6 months.-Proceed today.  Next due June 2025 Continue darolutamide 600 mg twice daily, close monitor PSA Q 3 months and consider obtaining PET scan in the near future. NGS molecular studies.-[facility] NGS showed Tp53, TMB 5.3, MSI stable. Patient declined genetic testing.

## 2023-02-20 NOTE — Assessment & Plan Note (Signed)
Hot flash due to ADT, continue Effexor 37.5mg daily. 

## 2023-02-20 NOTE — Assessment & Plan Note (Signed)
Chemotherapy plan as above 

## 2023-02-20 NOTE — Progress Notes (Signed)
Hematology/Oncology Progress note Telephone:([phone] Fax:([phone]      CHIEF COMPLAINTS/REASON FOR VISIT:  Follow-up for prostate cancer treatment   ASSESSMENT & PLAN:   Cancer Staging  Prostate cancer [facility]) Staging form: Prostate, AJCC 8th Edition - Clinical stage from 06/23/2020: Stage IVB (cT2c, cNX, cM1b, PSA: 678, Grade Group: 5) - Signed by Rickard Patience, MD on 06/23/2020   Prostate cancer [facility]) Metastatic prostate cancer with bone metastasis,  castration sensitive, s/p Docetaxel x 6 cycles, followed by Darolutamide.  Labs reviewed and discussed with patient.   PSA has been stable, gradually increasing.  Today 2.97. Continue androgen deprivation therapy-Eligard 45 mg every 6 months.-Proceed today.  Next due June 2025 Continue darolutamide 600 mg twice daily, close monitor PSA Q 3 months and consider obtaining PET scan in the near future. NGS molecular studies.-[facility] NGS showed Tp53, TMB 5.3, MSI stable. Patient declined genetic testing.  Encounter for antineoplastic chemotherapy Chemotherapy plan as above  Port-A-Cath in place Continue port flush every 6-8 weeks  Tobacco use Smoke cessation discussed Recommend him to establish with PCP CT chest lung cancer screening is up-to-date.  Vasomotor flushing Hot flash due to ADT, continue Effexor 37.5mg  daily.  Orders Placed This Encounter  Procedures   CBC with Differential (Cancer Center Only)    Standing Status:   Future    Expected Date:   05/21/2023    Expiration Date:   02/20/2024   CMP (Cancer Center only)    Standing Status:   Future    Expected Date:   05/21/2023    Expiration Date:   02/20/2024   PSA    Standing Status:   Future    Expected Date:   05/21/2023    Expiration Date:   02/20/2024   PSA    Standing Status:   Future    Number of Occurrences:   1    Expected Date:   02/20/2023    Expiration Date:   02/20/2024   Testosterone    Standing Status:   Future    Number of Occurrences:   1     Expected Date:   02/20/2023    Expiration Date:   02/20/2024   Follow up in 3 months. Lab MD   All questions were answered. The patient knows to call the clinic with any problems, questions or concerns.  Rickard Patience, MD, PhD [facility] Hematology Oncology 02/20/2023    HISTORY OF PRESENTING ILLNESS:  05/27/2020 patient presented to emergency room for evaluation of generalized weakness and and body pain.  Prior to the presentations, patient also had history of tooth abscess and was prescribed antibiotics. Reports acute on chronic right-sided lumbar pain without any prior trauma history.  Profound weakness.  Decreased appetite and loss of weight about 10 pounds during the past few months.  Also complained intermittent right upper quadrant discomfort  326 04/10/2020 CT abdomen pelvis showed a diffusely patchy sclerotic appearance of the osseous structures suspicious for osseous metastatic disease.  Prostate gland is heterogeneous in appearance.  Correlate with PSA.  Patient had a mild superior endplate compression deformity of L4 with less than 10% vertebral body height loss.  H indeterminate.  Fusiform infrarenal abdominal aortic aneurysm measuring up to 3.5 cm.  Follow-up in 2 years. Suspicious findings for early acute uncomplicated appendicitis.  Patient denies any right lower quadrant pain.  In the emergency room, he has a negative Murphy sign.  #06/16/2020, patient is status post prostate biopsy-8 out of 12 cores pathology  positive for  Anicar  adenocarcinoma.  Highest Gleason score 5+4 Postprocedure, patient has had hematuria and hematochezia, bilateral lower extremity pain, AKI orthostatic hypovolemia, sepsis and was admitted and treated.  Patient was discharged on a course of antibiotics.  06/18/2020 MRI thoracic, lumbar, sacrum is compatible with diffuse osseous metastasis in the visualized thoracic lumbar spine, sacrum and bilateral iliac bones.  No definitive dural based tumor is identified.   No compression fracture.  Spine spondylosis  06/19/2020, patient received loading dose of Firmagon 240 mg x 1. #06/22/2020, bone scan showed widespread osseous metastatic disease involving the appendicular and axial skeleton #April 2022. COVID infection. #07/10/2020, Dr. Wyn Quaker placed Mediport #07/17/2020-10/30/2020 6 cycles of docetaxel.   #03/06/2021, started on darolutamide  [ARASENS trial, the combination of darolutamide plus docetaxel is an approved option for treatment of metastatic CSPC- Improved OS, time to develop castration resistant disease].   #INTERVAL HISTORY Cody Vaughan is a 62 y.o. male who has above history reviewed by me today presents for follow up visit for management of metastatic prostate cancer, Patient continues on darolutamide 600 mg twice daily.  Chronic back pain after work. + hot flash improved after taking Effexor.  Patient tolerates treatments, patient denies any new complaints today.  Review of Systems  Constitutional:  Positive for fatigue. Negative for appetite change, chills, fever and unexpected weight change.       Patient walks independently  HENT:   Negative for hearing loss and voice change.   Eyes:  Negative for eye problems and icterus.  Respiratory:  Negative for chest tightness, cough and shortness of breath.   Cardiovascular:  Negative for chest pain and leg swelling.  Gastrointestinal:  Negative for abdominal distention and abdominal pain.  Endocrine: Positive for hot flashes.  Genitourinary:  Negative for difficulty urinating, dysuria and frequency.   Musculoskeletal:  Positive for back pain. Negative for arthralgias.  Skin:  Negative for itching and rash.  Neurological:  Negative for light-headedness and numbness.  Hematological:  Negative for adenopathy. Does not bruise/bleed easily.  Psychiatric/Behavioral:  Negative for confusion.     MEDICAL HISTORY:  Past Medical History:  Diagnosis Date   Prostate cancer (HCC)    2022    SURGICAL  HISTORY: Past Surgical History:  Procedure Laterality Date   PORTA CATH INSERTION N/A 07/10/2020   Procedure: PORTA CATH INSERTION;  Surgeon: Annice Needy, MD;  Location: [facility] INVASIVE CV LAB;  Service: Cardiovascular;  Laterality: N/A;   PROSTATE BIOPSY N/A 06/16/2020   Procedure: PROSTATE BIOPSY;  Surgeon: Sondra Come, MD;  Location: [facility] ORS;  Service: Urology;  Laterality: N/A;   SHOULDER SURGERY     1985   TRANSRECTAL ULTRASOUND N/A 06/16/2020   Procedure: TRANSRECTAL ULTRASOUND;  Surgeon: Sondra Come, MD;  Location: [facility] ORS;  Service: Urology;  Laterality: N/A;    SOCIAL HISTORY: Social History   Socioeconomic History   Marital status: Married    Spouse name: Not on file   Number of children: Not on file   Years of education: Not on file   Highest education level: Not on file  Occupational History   Occupation: Cody Vaughan    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, 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  Tobacco Use   Smoking status: Former    Current packs/day: 0.00    Average packs/day: 1 pack/day for 45.0 years (45.0 ttl pk-yrs)    Types: Cigarettes    Start date: 07/09/1977    Quit date: 07/10/2022    Years since quitting: 0.6   Smokeless  tobacco: Never  Substance and Sexual Activity   Alcohol use: Not Currently   Drug use: Never   Sexual activity: Not Currently  Other Topics Concern   Not on file  Social History Narrative   Not on file   Social Drivers of Health   Financial Resource Strain: Not on file  Food Insecurity: Not on file  Transportation Needs: Not on file  Physical Activity: Not on file  Stress: Not on file  Social Connections: Not on file  Intimate Partner Violence: Not on file    FAMILY HISTORY: Family History  Problem Relation Age of Onset   COPD Mother    Heart attack Father     ALLERGIES:  has no known allergies.  MEDICATIONS:  Current Outpatient Medications  Medication Sig Dispense Refill   acetaminophen (TYLENOL) 325 MG tablet Take 650 mg by mouth every 6  (six) hours as needed for moderate pain or mild pain.     Calcium 600-400 MG-UNIT CHEW Chew 2 tablets by mouth daily. 60 tablet 3   Multiple Vitamins-Minerals (MULTIVITAMIN WITH MINERALS) tablet Take 1 tablet by mouth daily.     nicotine (NICODERM CQ - DOSED IN MG/24 HOURS) 14 mg/24hr patch PLACE 1 PATCH ONTO THE SKIN DAILY 30 patch 0   venlafaxine (EFFEXOR) 37.5 MG tablet Take 1 tablet (37.5 mg total) by mouth daily. 90 tablet 1   darolutamide (NUBEQA) 300 MG tablet Take 2 tablets (600 mg total) by mouth 2 (two) times daily with a meal. 120 tablet 5   No current facility-administered medications for this visit.   Facility-Administered Medications Ordered in Other Visits  Medication Dose Route Frequency Provider Last Rate Last Admin   heparin lock flush 100 UNIT/ML injection              PHYSICAL EXAMINATION: ECOG PERFORMANCE STATUS: 0 - Asymptomatic Vitals:   02/20/23 0951  BP: 136/74  Pulse: (!) 55  Resp: 18  Temp: (!) 96.4 F (35.8 C)  SpO2: 99%   Filed Weights   02/20/23 0951  Weight: 191 lb 11.2 oz (87 kg)    Physical Exam Constitutional:      General: He is not in acute distress. HENT:     Head: Normocephalic and atraumatic.  Eyes:     General: No scleral icterus. Cardiovascular:     Rate and Rhythm: Normal rate and regular rhythm.     Heart sounds: Normal heart sounds.  Pulmonary:     Effort: Pulmonary effort is normal. No respiratory distress.     Breath sounds: No wheezing.  Abdominal:     General: Bowel sounds are normal. There is no distension.     Palpations: Abdomen is soft.  Musculoskeletal:        General: No deformity. Normal range of motion.     Cervical back: Normal range of motion and neck supple.  Skin:    General: Skin is warm and dry.     Findings: No erythema or rash.  Neurological:     Mental Status: He is alert and oriented to person, place, and time. Mental status is at baseline.     Cranial Nerves: No cranial nerve deficit.      Coordination: Coordination normal.  Psychiatric:        Mood and Affect: Mood normal.     LABORATORY DATA:  I have reviewed the data as listed    Latest Ref Rng & Units 02/18/2023    8:07 AM 12/02/2022    7:58 AM  08/16/2022    9:06 AM  CBC  WBC 4.0 - 10.5 K/uL 5.2  7.0  6.1   Hemoglobin 13.0 - 17.0 g/dL 73.7  10.6  26.9   Hematocrit 39.0 - 52.0 % 36.2  36.9  37.9   Platelets 150 - 400 K/uL 222  239  217       Latest Ref Rng & Units 02/18/2023    8:07 AM 12/02/2022    7:58 AM 08/16/2022    9:06 AM  CMP  Glucose 70 - 99 mg/dL 485  462  703   BUN 8 - 23 mg/dL 14  16  20    Creatinine 0.61 - 1.24 mg/dL 5.00  9.38  1.82   Sodium 135 - 145 mmol/L 136  135  138   Potassium 3.5 - 5.1 mmol/L 3.8  3.9  4.0   Chloride 98 - 111 mmol/L 101  103  104   CO2 22 - 32 mmol/L 26  25  25    Calcium 8.9 - 10.3 mg/dL 8.9  9.1  9.1   Total Protein 6.5 - 8.1 g/dL 6.9  7.5  7.4   Total Bilirubin <1.2 mg/dL 0.3  0.7  0.6   Alkaline Phos 38 - 126 U/L 93  100  101   AST 15 - 41 U/L 28  32  32   ALT 0 - 44 U/L 37  34  40      RADIOGRAPHIC STUDIES: I have personally reviewed the radiological images as listed and agreed with the findings in the report. CT CHEST LUNG CA SCREEN LOW DOSE W/O CM Result Date: 01/09/2023 CLINICAL DATA:  62 year old male with 49 pack year history of smoking. Lung cancer screening. EXAM: CT CHEST WITHOUT CONTRAST LOW-DOSE FOR LUNG CANCER SCREENING TECHNIQUE: Multidetector CT imaging of the chest was performed following the standard protocol without IV contrast. RADIATION DOSE REDUCTION: This exam was performed according to the departmental dose-optimization program which includes automated exposure control, adjustment of the mA and/or kV according to patient size and/or use of iterative reconstruction technique. COMPARISON:  11/27/2021 FINDINGS: Cardiovascular: The heart size is normal. No substantial pericardial effusion. Mild atherosclerotic calcification is noted in the wall of the  thoracic aorta. Ascending thoracic aorta measures 4.8 cm diameter. Enlargement of the pulmonary outflow tract/main pulmonary arteries suggests pulmonary arterial hypertension. Right Port-A-Cath tip is positioned in the distal SVC. Mediastinum/Nodes: No mediastinal lymphadenopathy. No evidence for gross hilar lymphadenopathy although assessment is limited by the lack of intravenous contrast on the current study. The esophagus has normal imaging features. There is no axillary lymphadenopathy. Lungs/Pleura: Centrilobular and paraseptal emphysema evident. Tiny calcified nodule identified previously is stable in the interval. No new suspicious pulmonary nodule or mass. No focal airspace consolidation. No pleural effusion. Upper Abdomen: Visualized portion of the upper abdomen is unremarkable. Musculoskeletal: Diffuse bony sclerosis compatible with known prostate cancer. IMPRESSION: 1. Lung-RADS 1, negative. Continue annual screening with low-dose chest CT without contrast in 12 months. Ascending thoracic aortic aneurysm. Recommend semi-annual imaging followup by CTA or MRA and referral to cardiothoracic surgery if not already obtained. This recommendation follows 2010 ACCF/AHA/AATS/ACR/ASA/SCA/SCAI/SIR/STS/SVM Guidelines for the Diagnosis and Management of Patients With Thoracic Aortic Disease. Circulation. 2010; 121: X937-J696. Aortic aneurysm NOS (ICD10-I71.9) 2. Diffuse bony sclerosis compatible with known prostate cancer. 3. Aortic Atherosclerosis (ICD10-I70.0) and Emphysema (ICD10-J43.9). Electronically Signed   By: Kennith Center M.D.   On: 01/09/2023 13:55

## 2023-02-20 NOTE — Assessment & Plan Note (Signed)
Smoke cessation discussed Recommend him to establish with PCP CT chest lung cancer screening is up-to-date.

## 2023-02-21 LAB — TESTOSTERONE: Testosterone: <3 ng/dL — ABNORMAL LOW (ref 264–916)

## 2023-05-19 ENCOUNTER — Inpatient Hospital Stay: Payer: Self-pay | Attending: Oncology

## 2023-05-19 DIAGNOSIS — C61 Malignant neoplasm of prostate: Secondary | ICD-10-CM | POA: Insufficient documentation

## 2023-05-19 DIAGNOSIS — C7951 Secondary malignant neoplasm of bone: Secondary | ICD-10-CM | POA: Insufficient documentation

## 2023-05-19 DIAGNOSIS — R232 Flushing: Secondary | ICD-10-CM | POA: Insufficient documentation

## 2023-05-19 DIAGNOSIS — Z87891 Personal history of nicotine dependence: Secondary | ICD-10-CM | POA: Insufficient documentation

## 2023-05-19 LAB — CMP (CANCER CENTER ONLY)
ALT: 34 U/L (ref 0–44)
AST: 26 U/L (ref 15–41)
Albumin: 3.8 g/dL (ref 3.5–5.0)
Alkaline Phosphatase: 101 U/L (ref 38–126)
Anion gap: 8 (ref 5–15)
BUN: 14 mg/dL (ref 8–23)
CO2: 25 mmol/L (ref 22–32)
Calcium: 9.1 mg/dL (ref 8.9–10.3)
Chloride: 100 mmol/L (ref 98–111)
Creatinine: 0.71 mg/dL (ref 0.61–1.24)
GFR, Estimated: >60 mL/min (ref >60)
Glucose, Bld: 126 mg/dL — ABNORMAL HIGH (ref 70–99)
Potassium: 3.9 mmol/L (ref 3.5–5.1)
Sodium: 133 mmol/L — ABNORMAL LOW (ref 135–145)
Total Bilirubin: 0.4 mg/dL (ref 0.0–1.2)
Total Protein: 7.2 g/dL (ref 6.5–8.1)

## 2023-05-19 LAB — CBC WITH DIFFERENTIAL (CANCER CENTER ONLY)
Abs Immature Granulocytes: 0.02 10*3/uL (ref 0.00–0.07)
Basophils Absolute: 0.1 10*3/uL (ref 0.0–0.1)
Basophils Relative: 1 %
Eosinophils Absolute: 0.4 10*3/uL (ref 0.0–0.5)
Eosinophils Relative: 5 %
HCT: 39.2 % (ref 39.0–52.0)
Hemoglobin: 13.1 g/dL (ref 13.0–17.0)
Immature Granulocytes: 0 %
Lymphocytes Relative: 27 %
Lymphs Abs: 1.9 10*3/uL (ref 0.7–4.0)
MCH: 30.3 pg (ref 26.0–34.0)
MCHC: 33.4 g/dL (ref 30.0–36.0)
MCV: 90.5 fL (ref 80.0–100.0)
Monocytes Absolute: 0.7 10*3/uL (ref 0.1–1.0)
Monocytes Relative: 9 %
Neutro Abs: 4.1 10*3/uL (ref 1.7–7.7)
Neutrophils Relative %: 58 %
Platelet Count: 245 10*3/uL (ref 150–400)
RBC: 4.33 MIL/uL (ref 4.22–5.81)
RDW: 13.6 % (ref 11.5–15.5)
WBC Count: 7.1 10*3/uL (ref 4.0–10.5)
nRBC: 0 % (ref 0.0–0.2)

## 2023-05-19 LAB — PSA: Prostatic Specific Antigen: 4.42 ng/mL — ABNORMAL HIGH (ref 0.00–4.00)

## 2023-05-22 ENCOUNTER — Inpatient Hospital Stay: Payer: Self-pay | Admitting: Oncology

## 2023-05-22 ENCOUNTER — Encounter: Payer: Self-pay | Admitting: Oncology

## 2023-05-22 ENCOUNTER — Inpatient Hospital Stay: Payer: Self-pay

## 2023-05-22 VITALS — BP 116/67 | HR 67 | Temp 95.0°F | Resp 18 | Wt 188.3 lb

## 2023-05-22 DIAGNOSIS — C61 Malignant neoplasm of prostate: Secondary | ICD-10-CM

## 2023-05-22 DIAGNOSIS — Z720 Tobacco use: Secondary | ICD-10-CM

## 2023-05-22 DIAGNOSIS — Z95828 Presence of other vascular implants and grafts: Secondary | ICD-10-CM

## 2023-05-22 DIAGNOSIS — Z87891 Personal history of nicotine dependence: Secondary | ICD-10-CM

## 2023-05-22 DIAGNOSIS — Z5111 Encounter for antineoplastic chemotherapy: Secondary | ICD-10-CM

## 2023-05-22 DIAGNOSIS — R232 Flushing: Secondary | ICD-10-CM

## 2023-05-22 MED ORDER — SODIUM CHLORIDE 0.9% FLUSH
10.0000 mL | Freq: Once | INTRAVENOUS | Status: AC
Start: 2023-05-22 — End: 2023-05-22
  Administered 2023-05-22: 10 mL via INTRAVENOUS
  Filled 2023-05-22: qty 10

## 2023-05-22 MED ORDER — HEPARIN SOD (PORK) LOCK FLUSH 100 UNIT/ML IV SOLN
500.0000 [IU] | Freq: Once | INTRAVENOUS | Status: AC
Start: 2023-05-22 — End: 2023-05-22
  Administered 2023-05-22: 500 [IU] via INTRAVENOUS
  Filled 2023-05-22: qty 5

## 2023-05-22 MED ORDER — VENLAFAXINE HCL 37.5 MG PO TABS: 37.5000 mg | ORAL_TABLET | Freq: Every day | ORAL | 1 refills | Status: DC

## 2023-05-22 NOTE — Assessment & Plan Note (Signed)
Hot flash due to ADT, continue Effexor 37.5mg daily. 

## 2023-05-22 NOTE — Assessment & Plan Note (Signed)
Continue port flush every 6-8 weeks.  

## 2023-05-22 NOTE — Progress Notes (Signed)
 Hematology/Oncology Progress note Telephone:(336) [phone] Fax:(336) [phone]      CHIEF COMPLAINTS/REASON FOR VISIT:  Follow-up for prostate cancer treatment   ASSESSMENT & PLAN:   Cancer Staging  Prostate cancer [facility]) Staging form: Prostate, AJCC 8th Edition - Clinical stage from 06/23/2020: Stage IVB (cT2c, cNX, cM1b, PSA: 678, Grade Group: 5) - Signed by Rickard Patience, MD on 06/23/2020   Prostate cancer [facility]) Metastatic prostate cancer with bone metastasis,  castration sensitive, s/p Docetaxel x 6 cycles, followed by Darolutamide.   NGS molecular studies.-[facility] NGS showed Tp53, TMB 5.3, MSI stable. Patient declined genetic testing.  Labs reviewed and discussed with patient.   PSA has been stable, gradually increasing.  Today 4.42 Continue androgen deprivation therapy-Eligard 45 mg every 6 months. Next due June 2025 Continue darolutamide 600 mg twice daily, recommend obtaining PSMA PET scan   Encounter for antineoplastic chemotherapy Chemotherapy plan as above  Port-A-Cath in place Continue port flush every 6-8 weeks  Tobacco use Smoke cessation discussed Recommend him to establish with PCP CT chest lung cancer screening is up-to-date.  Vasomotor flushing Hot flash due to ADT, continue Effexor 37.5mg  daily.  Orders Placed This Encounter  Procedures   NM PET ([facility]) SKULL TO MID THIGH    Standing Status:   Future    Expected Date:   06/17/2023    Expiration Date:   05/21/2024    If indicated for the ordered procedure, I authorize the administration of a radiopharmaceutical per Radiology protocol:   Yes    Preferred imaging location?:   [facility]   Follow up TBD Pending PET scan results.  All questions were answered. The patient knows to call the clinic with any problems, questions or concerns.  Rickard Patience, MD, PhD [facility] Hematology Oncology 05/22/2023    HISTORY OF PRESENTING ILLNESS:  05/27/2020 patient presented to emergency room for evaluation of  generalized weakness and and body pain.  Prior to the presentations, patient also had history of tooth abscess and was prescribed antibiotics. Reports acute on chronic right-sided lumbar pain without any prior trauma history.  Profound weakness.  Decreased appetite and loss of weight about 10 pounds during the past few months.  Also complained intermittent right upper quadrant discomfort  326 04/10/2020 CT abdomen pelvis showed a diffusely patchy sclerotic appearance of the osseous structures suspicious for osseous metastatic disease.  Prostate gland is heterogeneous in appearance.  Correlate with PSA.  Patient had a mild superior endplate compression deformity of L4 with less than 10% vertebral body height loss.  H indeterminate.  Fusiform infrarenal abdominal aortic aneurysm measuring up to 3.5 cm.  Follow-up in 2 years. Suspicious findings for early acute uncomplicated appendicitis.  Patient denies any right lower quadrant pain.  In the emergency room, he has a negative Murphy sign.  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  Highest Gleason score 5+4 Postprocedure, patient has had hematuria and hematochezia, bilateral lower extremity pain, AKI orthostatic hypovolemia, sepsis and was admitted and treated.  Patient was discharged on a course of antibiotics.  06/18/2020 MRI thoracic, lumbar, sacrum is compatible with diffuse osseous metastasis in the visualized thoracic lumbar spine, sacrum and bilateral iliac bones.  No definitive dural based tumor is identified.  No compression fracture.  Spine spondylosis  06/19/2020, patient received loading dose of Firmagon 240 mg x 1. #06/22/2020, bone scan showed widespread osseous metastatic disease involving the appendicular and axial skeleton #April 2022. COVID infection. #07/10/2020,  Dr. Wyn Quaker placed Mediport #07/17/2020-10/30/2020 6 cycles of docetaxel.   #03/06/2021, started on darolutamide   [ARASENS trial, the combination of darolutamide plus docetaxel is an approved option for treatment of metastatic CSPC- Improved OS, time to develop castration resistant disease].   #INTERVAL HISTORY Cody Vaughan is a 63 y.o. male who has above history reviewed by me today presents for follow up visit for management of metastatic prostate cancer, Patient continues on darolutamide 600 mg twice daily.  Chronic back pain after work. + hot flash improved after taking Effexor.  Patient tolerates treatments, patient denies any new complaints today.  Review of Systems  Constitutional:  Positive for fatigue. Negative for appetite change, chills, fever and unexpected weight change.       Patient walks independently  HENT:   Negative for hearing loss and voice change.   Eyes:  Negative for eye problems and icterus.  Respiratory:  Negative for chest tightness, cough and shortness of breath.   Cardiovascular:  Negative for chest pain and leg swelling.  Gastrointestinal:  Negative for abdominal distention and abdominal pain.  Endocrine: Positive for hot flashes.  Genitourinary:  Negative for difficulty urinating, dysuria and frequency.   Musculoskeletal:  Positive for back pain. Negative for arthralgias.  Skin:  Negative for itching and rash.  Neurological:  Negative for light-headedness and numbness.  Hematological:  Negative for adenopathy. Does not bruise/bleed easily.  Psychiatric/Behavioral:  Negative for confusion.     MEDICAL HISTORY:  Past Medical History:  Diagnosis Date   Prostate cancer (HCC)    2022    SURGICAL HISTORY: Past Surgical History:  Procedure Laterality Date   PORTA CATH INSERTION N/A 07/10/2020   Procedure: PORTA CATH INSERTION;  Surgeon: Annice Needy, MD;  Location: [facility] INVASIVE CV LAB;  Service: Cardiovascular;  Laterality: N/A;   PROSTATE BIOPSY N/A 06/16/2020   Procedure: PROSTATE BIOPSY;  Surgeon: Sondra Come, MD;  Location: [facility] ORS;  Service: Urology;   Laterality: N/A;   SHOULDER SURGERY     1985   TRANSRECTAL ULTRASOUND N/A 06/16/2020   Procedure: TRANSRECTAL ULTRASOUND;  Surgeon: Sondra Come, MD;  Location: [facility] ORS;  Service: Urology;  Laterality: N/A;    SOCIAL HISTORY: Social History   Socioeconomic History   Marital status: Married    Spouse name: Not on file   Number of children: Not on file   Years of education: Not on file   Highest education level: Not on file  Occupational History   Occupation: land scaper    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, 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  Tobacco Use   Smoking status: Former    Current packs/day: 0.00    Average packs/day: 1 pack/day for 45.0 years (45.0 ttl pk-yrs)    Types: Cigarettes    Start date: 07/09/1977    Quit date: 07/10/2022    Years since quitting: 0.8   Smokeless tobacco: Never  Substance and Sexual Activity   Alcohol use: Not Currently   Drug use: Never   Sexual activity: Not Currently  Other Topics Concern   Not on file  Social History Narrative   Not on file   Social Drivers of Health   Financial Resource Strain: Not on file  Food Insecurity: Not on file  Transportation Needs: Not on file  Physical Activity: Not on file  Stress: Not on file  Social Connections: Not on file  Intimate Partner Violence: Not on file    FAMILY HISTORY: Family History  Problem Relation Age of Onset  COPD Mother    Heart attack Father     ALLERGIES:  has no known allergies.  MEDICATIONS:  Current Outpatient Medications  Medication Sig Dispense Refill   acetaminophen (TYLENOL) 325 MG tablet Take 650 mg by mouth every 6 (six) hours as needed for moderate pain or mild pain.     Calcium 600-400 MG-UNIT CHEW Chew 2 tablets by mouth daily. 60 tablet 3   darolutamide (NUBEQA) 300 MG tablet Take 2 tablets (600 mg total) by mouth 2 (two) times daily with a meal. 120 tablet 5   Multiple Vitamins-Minerals (MULTIVITAMIN WITH MINERALS) tablet Take 1 tablet by mouth daily.     nicotine (NICODERM  CQ - DOSED IN MG/24 HOURS) 14 mg/24hr patch PLACE 1 PATCH ONTO THE SKIN DAILY 30 patch 0   venlafaxine (EFFEXOR) 37.5 MG tablet Take 1 tablet (37.5 mg total) by mouth daily. 90 tablet 1   No current facility-administered medications for this visit.   Facility-Administered Medications Ordered in Other Visits  Medication Dose Route Frequency Provider Last Rate Last Admin   heparin lock flush 100 UNIT/ML injection              PHYSICAL EXAMINATION: ECOG PERFORMANCE STATUS: 0 - Asymptomatic Vitals:   05/22/23 1045  BP: 116/67  Pulse: 67  Resp: 18  Temp: (!) 95 F (35 C)  SpO2: 98%   Filed Weights   05/22/23 1045  Weight: 188 lb 4.8 oz (85.4 kg)    Physical Exam Constitutional:      General: He is not in acute distress. HENT:     Head: Normocephalic and atraumatic.  Eyes:     General: No scleral icterus. Cardiovascular:     Rate and Rhythm: Normal rate and regular rhythm.     Heart sounds: Normal heart sounds.  Pulmonary:     Effort: Pulmonary effort is normal. No respiratory distress.     Breath sounds: No wheezing.  Abdominal:     General: Bowel sounds are normal. There is no distension.     Palpations: Abdomen is soft.  Musculoskeletal:        General: No deformity. Normal range of motion.     Cervical back: Normal range of motion and neck supple.  Skin:    General: Skin is warm and dry.     Findings: No erythema or rash.  Neurological:     Mental Status: He is alert and oriented to person, place, and time. Mental status is at baseline.     Cranial Nerves: No cranial nerve deficit.     Coordination: Coordination normal.  Psychiatric:        Mood and Affect: Mood normal.     LABORATORY DATA:  I have reviewed the data as listed    Latest Ref Rng & Units 05/19/2023   10:50 AM 02/18/2023    8:07 AM 12/02/2022    7:58 AM  CBC  WBC 4.0 - 10.5 K/uL 7.1  5.2  7.0   Hemoglobin 13.0 - 17.0 g/dL 40.9  81.1  91.4   Hematocrit 39.0 - 52.0 % 39.2  36.2  36.9    Platelets 150 - 400 K/uL 245  222  239       Latest Ref Rng & Units 05/19/2023   10:50 AM 02/18/2023    8:07 AM 12/02/2022    7:58 AM  CMP  Glucose 70 - 99 mg/dL 782  956  213   BUN 8 - 23 mg/dL 14  14  16  Creatinine 0.61 - 1.24 mg/dL 1.61  0.96  0.45   Sodium 135 - 145 mmol/L 133  136  135   Potassium 3.5 - 5.1 mmol/L 3.9  3.8  3.9   Chloride 98 - 111 mmol/L 100  101  103   CO2 22 - 32 mmol/L 25  26  25    Calcium 8.9 - 10.3 mg/dL 9.1  8.9  9.1   Total Protein 6.5 - 8.1 g/dL 7.2  6.9  7.5   Total Bilirubin 0.0 - 1.2 mg/dL 0.4  0.3  0.7   Alkaline Phos 38 - 126 U/L 101  93  100   AST 15 - 41 U/L 26  28  32   ALT 0 - 44 U/L 34  37  34      RADIOGRAPHIC STUDIES: I have personally reviewed the radiological images as listed and agreed with the findings in the report. No results found.

## 2023-05-22 NOTE — Assessment & Plan Note (Signed)
Smoke cessation discussed Recommend him to establish with PCP CT chest lung cancer screening is up-to-date.

## 2023-05-22 NOTE — Assessment & Plan Note (Addendum)
 Metastatic prostate cancer with bone metastasis,  castration sensitive, s/p Docetaxel x 6 cycles, followed by Darolutamide.   NGS molecular studies.-[facility] NGS showed Tp53, TMB 5.3, MSI stable. Patient declined genetic testing.  Labs reviewed and discussed with patient.   PSA has been stable, gradually increasing.  Today 4.42 Continue androgen deprivation therapy-Eligard 45 mg every 6 months. Next due June 2025 Continue darolutamide 600 mg twice daily, recommend obtaining PSMA PET scan

## 2023-05-22 NOTE — Assessment & Plan Note (Signed)
Chemotherapy plan as above 

## 2023-06-10 ENCOUNTER — Encounter: Payer: Self-pay | Admitting: Oncology

## 2023-06-11 ENCOUNTER — Ambulatory Visit: Payer: Self-pay

## 2023-07-07 ENCOUNTER — Encounter: Payer: Self-pay | Admitting: Oncology

## 2023-07-10 ENCOUNTER — Other Ambulatory Visit: Payer: Self-pay | Admitting: Pharmacist

## 2023-07-10 DIAGNOSIS — C61 Malignant neoplasm of prostate: Secondary | ICD-10-CM

## 2023-07-11 ENCOUNTER — Telehealth: Payer: Self-pay

## 2023-07-11 NOTE — Telephone Encounter (Addendum)
 Called pt to follow up on PET scan cancellation. Spoke to wife [name], who states that she has been trying to r/s PET.   Informed her that we will get appt r/s. She verbalized understanding.   Please schedule and call pt/ wife with appt details (pt prefers early morning appts and NO fridays:   PSMA PET - first available.  MD 1 week after lab  OK to move port flush on 5/15 to same day as MD

## 2023-07-14 ENCOUNTER — Encounter: Payer: Self-pay | Admitting: Oncology

## 2023-07-14 MED ORDER — DAROLUTAMIDE 300 MG PO TABS
600.0000 mg | ORAL_TABLET | Freq: Two times a day (BID) | ORAL | 0 refills | Status: DC
Start: 2023-07-14 — End: 2023-08-05

## 2023-07-15 NOTE — Telephone Encounter (Signed)
 Spoke to pt and his wife. Informed them that we are still waiting on PET auth. They would like to cancel Port flush on 5/15 and we will just schedule port flush the same day he sees MD (1 week after PET)

## 2023-07-17 ENCOUNTER — Inpatient Hospital Stay: Payer: Self-pay

## 2023-07-21 ENCOUNTER — Encounter: Payer: Self-pay | Admitting: Oncology

## 2023-07-21 NOTE — Telephone Encounter (Addendum)
 PET scheduled on 5/27, please schedule Port flush / MD approx 1 week after PET and notify pt of appt.

## 2023-07-29 ENCOUNTER — Ambulatory Visit
Admission: RE | Admit: 2023-07-29 | Discharge: 2023-07-29 | Disposition: A | Discharge status: Home / Self Care | Source: Ambulatory Visit | Attending: Oncology | Admitting: Oncology

## 2023-07-29 DIAGNOSIS — R9721 Rising PSA following treatment for malignant neoplasm of prostate: Secondary | ICD-10-CM | POA: Diagnosis not present

## 2023-07-29 DIAGNOSIS — M7591 Shoulder lesion, unspecified, right shoulder: Secondary | ICD-10-CM | POA: Diagnosis not present

## 2023-07-29 DIAGNOSIS — C7951 Secondary malignant neoplasm of bone: Secondary | ICD-10-CM | POA: Insufficient documentation

## 2023-07-29 DIAGNOSIS — M7592 Shoulder lesion, unspecified, left shoulder: Secondary | ICD-10-CM | POA: Diagnosis not present

## 2023-07-29 DIAGNOSIS — C61 Malignant neoplasm of prostate: Secondary | ICD-10-CM | POA: Insufficient documentation

## 2023-07-29 MED ORDER — FLOTUFOLASTAT F 18 GALLIUM 296-5846 MBQ/ML IV SOLN
8.5500 | Freq: Once | INTRAVENOUS | Status: AC
Start: 2023-07-29 — End: 2023-07-29
  Administered 2023-07-29: 8.42 via INTRAVENOUS
  Filled 2023-07-29: qty 9

## 2023-08-05 ENCOUNTER — Telehealth: Payer: Self-pay

## 2023-08-05 ENCOUNTER — Other Ambulatory Visit: Payer: Self-pay | Admitting: Oncology

## 2023-08-05 ENCOUNTER — Inpatient Hospital Stay

## 2023-08-05 ENCOUNTER — Encounter: Payer: Self-pay | Admitting: Oncology

## 2023-08-05 ENCOUNTER — Inpatient Hospital Stay (HOSPITAL_BASED_OUTPATIENT_CLINIC_OR_DEPARTMENT_OTHER): Admitting: Oncology

## 2023-08-05 ENCOUNTER — Inpatient Hospital Stay: Attending: Oncology

## 2023-08-05 VITALS — BP 130/79 | HR 61 | Temp 96.0°F | Resp 18 | Wt 180.6 lb

## 2023-08-05 DIAGNOSIS — Z5111 Encounter for antineoplastic chemotherapy: Secondary | ICD-10-CM | POA: Diagnosis not present

## 2023-08-05 DIAGNOSIS — C61 Malignant neoplasm of prostate: Secondary | ICD-10-CM

## 2023-08-05 DIAGNOSIS — R232 Flushing: Secondary | ICD-10-CM | POA: Diagnosis not present

## 2023-08-05 DIAGNOSIS — C7951 Secondary malignant neoplasm of bone: Secondary | ICD-10-CM | POA: Insufficient documentation

## 2023-08-05 DIAGNOSIS — Z95828 Presence of other vascular implants and grafts: Secondary | ICD-10-CM

## 2023-08-05 DIAGNOSIS — G893 Neoplasm related pain (acute) (chronic): Secondary | ICD-10-CM | POA: Insufficient documentation

## 2023-08-05 DIAGNOSIS — Z87891 Personal history of nicotine dependence: Secondary | ICD-10-CM | POA: Diagnosis not present

## 2023-08-05 DIAGNOSIS — M899 Disorder of bone, unspecified: Secondary | ICD-10-CM

## 2023-08-05 MED ORDER — DAROLUTAMIDE 300 MG PO TABS: 600.0000 mg | ORAL_TABLET | Freq: Two times a day (BID) | ORAL | 0 refills | Status: DC

## 2023-08-05 MED ORDER — HEPARIN SOD (PORK) LOCK FLUSH 100 UNIT/ML IV SOLN
500.0000 [IU] | Freq: Once | INTRAVENOUS | Status: AC
  Administered 2023-08-05: 500 [IU] via INTRAVENOUS
  Filled 2023-08-05: qty 5

## 2023-08-05 MED ORDER — OXYCODONE HCL 5 MG PO TABS: 5.0000 mg | ORAL_TABLET | Freq: Four times a day (QID) | ORAL | 0 refills | Status: DC | PRN

## 2023-08-05 MED ORDER — SODIUM CHLORIDE 0.9% FLUSH
10.0000 mL | Freq: Once | INTRAVENOUS | Status: AC
  Administered 2023-08-05: 10 mL via INTRAVENOUS
  Filled 2023-08-05: qty 10

## 2023-08-05 MED ORDER — LEUPROLIDE ACETATE (6 MONTH) 45 MG ~~LOC~~ KIT
45.0000 mg | PACK | Freq: Once | SUBCUTANEOUS | Status: AC
  Administered 2023-08-05: 45 mg via SUBCUTANEOUS
  Filled 2023-08-05: qty 45

## 2023-08-05 NOTE — Addendum Note (Signed)
 Addended by: Timmy Forbes on: 08/05/2023 04:13 PM   Modules accepted: Orders

## 2023-08-05 NOTE — Progress Notes (Signed)
 Hematology/Oncology Progress note [phone] [phone]      CHIEF COMPLAINTS/REASON FOR VISIT:  Follow-up for prostate cancer treatment   ASSESSMENT & PLAN:   Cancer Staging  Prostate cancer [facility]) Staging form: Prostate, AJCC 8th Edition - Clinical stage from 06/23/2020: Stage IVB (cT2c, cNX, cM1b, PSA: 678, Grade Group: 5) - Signed by Timmy Forbes, MD on 06/23/2020   Prostate cancer [facility]) Metastatic prostate cancer with bone metastasis,  castration resistant  s/p Docetaxel  x 6 cycles, followed by Darolutamide .   NGS molecular studies.-[facility] NGS showed Tp53, TMB 5.3, MSI stable. Patient declined genetic testing.  Labs reviewed and discussed with patient.   PSA has been stable, gradually increasing.   PSMA showed disease progression in bone lesions.  Continue androgen deprivation therapy-Eligard  45 mg every 6 months. Proceed today and next due Dec 2025 Continue darolutamide  600 mg twice daily for now, next line treatment I recommend him to establish care with nuclear medicine for pluvicto treatments. Continue darolutamide  for now to bridge Pluvicto. Refer to palliative care  Encounter for antineoplastic chemotherapy Chemotherapy plan as above  Port-A-Cath in place Continue port flush every 6-8 weeks  Vasomotor flushing Hot flash due to ADT, continue Effexor  37.5mg  daily.  Neoplasm related pain Recommend Oxycodone  5mg  Q6 PRN.   Bone lesion Recommend bisphosphonate, need dental clearance.   Orders Placed This Encounter  Procedures   CMP (Cancer Center only)    Standing Status:   Future    Expected Date:   09/04/2023    Expiration Date:   08/04/2024   CBC with Differential (Cancer Center Only)    Standing Status:   Future    Expected Date:   09/04/2023    Expiration Date:   08/04/2024   PSA    Standing Status:   Future    Expected Date:   09/04/2023    Expiration Date:   08/04/2024   Follow up 1 month  All questions were answered. The patient knows to  call the clinic with any problems, questions or concerns.  Timmy Forbes, MD, PhD [facility] Hematology Oncology 08/05/2023    HISTORY OF PRESENTING ILLNESS:  05/27/2020 patient presented to emergency room for evaluation of generalized weakness and and body pain.  Prior to the presentations, patient also had history of tooth abscess and was prescribed antibiotics. Reports acute on chronic right-sided lumbar pain without any prior trauma history.  Profound weakness.  Decreased appetite and loss of weight about 10 pounds during the past few months.  Also complained intermittent right upper quadrant discomfort  326 04/10/2020 CT abdomen pelvis showed a diffusely patchy sclerotic appearance of the osseous structures suspicious for osseous metastatic disease.  Prostate gland is heterogeneous in appearance.  Correlate with PSA.  Patient had a mild superior endplate compression deformity of L4 with less than 10% vertebral body height loss.  H indeterminate.  Fusiform infrarenal abdominal aortic aneurysm measuring up to 3.5 cm.  Follow-up in 2 years. Suspicious findings for early acute uncomplicated appendicitis.  Patient denies any right lower quadrant pain.  In the emergency room, he has a negative Murphy sign.  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  Highest Gleason score 5+4 Postprocedure, patient has had hematuria and hematochezia, bilateral lower extremity pain, AKI orthostatic hypovolemia, sepsis and was admitted and treated.  Patient was discharged on a course of antibiotics.  06/18/2020 MRI thoracic, lumbar, sacrum is compatible with diffuse osseous metastasis in the visualized  thoracic lumbar spine, sacrum and bilateral iliac bones.  No definitive dural based tumor is identified.  No compression fracture.  Spine spondylosis  06/19/2020, patient received loading dose of Firmagon  240 mg x 1. #06/22/2020, bone scan showed  widespread osseous metastatic disease involving the appendicular and axial skeleton #April 2022. COVID infection. #07/10/2020, Dr. Vonna Guardian placed Mediport #07/17/2020-10/30/2020 6 cycles of docetaxel .   #03/06/2021, started on darolutamide   [ARASENS trial, the combination of darolutamide  plus docetaxel  is an approved option for treatment of metastatic CSPC- Improved OS, time to develop castration resistant disease].   #INTERVAL HISTORY CHELSEA NUSZ is a 63 y.o. male who has above history reviewed by me today presents for follow up visit for management of metastatic prostate cancer, Patient continues on darolutamide  600 mg twice daily.  Chronic back pain after work. + hot flash improved after taking Effexor .  Patient reports rib cage pain.    Review of Systems  Constitutional:  Positive for fatigue. Negative for appetite change, chills, fever and unexpected weight change.       Patient walks independently  HENT:   Negative for hearing loss and voice change.   Eyes:  Negative for eye problems and icterus.  Respiratory:  Negative for chest tightness, cough and shortness of breath.   Cardiovascular:  Negative for chest pain and leg swelling.  Gastrointestinal:  Negative for abdominal distention and abdominal pain.  Endocrine: Positive for hot flashes.  Genitourinary:  Negative for difficulty urinating, dysuria and frequency.   Musculoskeletal:  Positive for back pain. Negative for arthralgias.  Skin:  Negative for itching and rash.  Neurological:  Negative for light-headedness and numbness.  Hematological:  Negative for adenopathy. Does not bruise/bleed easily.  Psychiatric/Behavioral:  Negative for confusion.     MEDICAL HISTORY:  Past Medical History:  Diagnosis Date   Prostate cancer (HCC)    2022    SURGICAL HISTORY: Past Surgical History:  Procedure Laterality Date   PORTA CATH INSERTION N/A 07/10/2020   Procedure: PORTA CATH INSERTION;  Surgeon: Celso College, MD;  Location: [facility]  INVASIVE CV LAB;  Service: Cardiovascular;  Laterality: N/A;   PROSTATE BIOPSY N/A 06/16/2020   Procedure: PROSTATE BIOPSY;  Surgeon: Lawerence Pressman, MD;  Location: [facility] ORS;  Service: Urology;  Laterality: N/A;   SHOULDER SURGERY     1985   TRANSRECTAL ULTRASOUND N/A 06/16/2020   Procedure: TRANSRECTAL ULTRASOUND;  Surgeon: Lawerence Pressman, MD;  Location: [facility] ORS;  Service: Urology;  Laterality: N/A;    SOCIAL HISTORY: Social History   Socioeconomic History   Marital status: Married    Spouse name: Not on file   Number of children: Not on file   Years of education: Not on file   Highest education level: Not on file  Occupational History   Occupation: land scaper    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, 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  Tobacco Use   Smoking status: Former    Current packs/day: 0.00    Average packs/day: 1 pack/day for 45.0 years (45.0 ttl pk-yrs)    Types: Cigarettes    Start date: 07/09/1977    Quit date: 07/10/2022    Years since quitting: 1.0   Smokeless tobacco: Never  Substance and Sexual Activity   Alcohol use: Not Currently   Drug use: Never   Sexual activity: Not Currently  Other Topics Concern   Not on file  Social History Narrative   Not on file   Social Drivers of Health   Financial Resource Strain: Not  on file  Food Insecurity: Not on file  Transportation Needs: Not on file  Physical Activity: Not on file  Stress: Not on file  Social Connections: Not on file  Intimate Partner Violence: Not on file    FAMILY HISTORY: Family History  Problem Relation Age of Onset   COPD Mother    Heart attack Father     ALLERGIES:  has no known allergies.  MEDICATIONS:  Current Outpatient Medications  Medication Sig Dispense Refill   acetaminophen  (TYLENOL ) 325 MG tablet Take 650 mg by mouth every 6 (six) hours as needed for moderate pain or mild pain.     Calcium  600-400 MG-UNIT CHEW Chew 2 tablets by mouth daily. 60 tablet 3   Multiple Vitamins-Minerals (MULTIVITAMIN  WITH MINERALS) tablet Take 1 tablet by mouth daily.     nicotine  (NICODERM CQ  - DOSED IN MG/24 HOURS) 14 mg/24hr patch PLACE 1 PATCH ONTO THE SKIN DAILY 30 patch 0   oxyCODONE  (OXY IR/ROXICODONE ) 5 MG immediate release tablet Take 1 tablet (5 mg total) by mouth every 6 (six) hours as needed for severe pain (pain score 7-10). 60 tablet 0   venlafaxine  (EFFEXOR ) 37.5 MG tablet Take 1 tablet (37.5 mg total) by mouth daily. 90 tablet 1   darolutamide  (NUBEQA ) 300 MG tablet Take 2 tablets (600 mg total) by mouth 2 (two) times daily with a meal. 120 tablet 0   No current facility-administered medications for this visit.   Facility-Administered Medications Ordered in Other Visits  Medication Dose Route Frequency Provider Last Rate Last Admin   heparin  lock flush 100 UNIT/ML injection              PHYSICAL EXAMINATION: ECOG PERFORMANCE STATUS: 0 - Asymptomatic Vitals:   08/05/23 0837  BP: 130/79  Pulse: 61  Resp: 18  Temp: (!) 96 F (35.6 C)  SpO2: 100%   Filed Weights   08/05/23 0837  Weight: 180 lb 9.6 oz (81.9 kg)    Physical Exam Constitutional:      General: He is not in acute distress. HENT:     Head: Normocephalic and atraumatic.  Eyes:     General: No scleral icterus. Cardiovascular:     Rate and Rhythm: Normal rate and regular rhythm.     Heart sounds: Normal heart sounds.  Pulmonary:     Effort: Pulmonary effort is normal. No respiratory distress.     Breath sounds: No wheezing.  Abdominal:     General: Bowel sounds are normal. There is no distension.     Palpations: Abdomen is soft.  Musculoskeletal:        General: No deformity. Normal range of motion.     Cervical back: Normal range of motion and neck supple.  Skin:    General: Skin is warm and dry.     Findings: No erythema or rash.  Neurological:     Mental Status: He is alert and oriented to person, place, and time. Mental status is at baseline.     Cranial Nerves: No cranial nerve deficit.      Coordination: Coordination normal.  Psychiatric:        Mood and Affect: Mood normal.     LABORATORY DATA:  I have reviewed the data as listed    Latest Ref Rng & Units 05/19/2023   10:50 AM 02/18/2023    8:07 AM 12/02/2022    7:58 AM  CBC  WBC 4.0 - 10.5 K/uL 7.1  5.2  7.0   Hemoglobin 13.0 -  17.0 g/dL 40.9  81.1  91.4   Hematocrit 39.0 - 52.0 % 39.2  36.2  36.9   Platelets 150 - 400 K/uL 245  222  239       Latest Ref Rng & Units 05/19/2023   10:50 AM 02/18/2023    8:07 AM 12/02/2022    7:58 AM  CMP  Glucose 70 - 99 mg/dL 782  956  213   BUN 8 - 23 mg/dL 14  14  16    Creatinine 0.61 - 1.24 mg/dL 0.86  5.78  4.69   Sodium 135 - 145 mmol/L 133  136  135   Potassium 3.5 - 5.1 mmol/L 3.9  3.8  3.9   Chloride 98 - 111 mmol/L 100  101  103   CO2 22 - 32 mmol/L 25  26  25    Calcium  8.9 - 10.3 mg/dL 9.1  8.9  9.1   Total Protein 6.5 - 8.1 g/dL 7.2  6.9  7.5   Total Bilirubin 0.0 - 1.2 mg/dL 0.4  0.3  0.7   Alkaline Phos 38 - 126 U/L 101  93  100   AST 15 - 41 U/L 26  28  32   ALT 0 - 44 U/L 34  37  34      RADIOGRAPHIC STUDIES: I have personally reviewed the radiological images as listed and agreed with the findings in the report. NM PET (PSMA) SKULL TO MID THIGH Result Date: 07/29/2023 CLINICAL DATA:  Prostate carcinoma with biochemical recurrence. Rising PSA equal 4.4. EXAM: NUCLEAR MEDICINE PET SKULL BASE TO THIGH TECHNIQUE: 8.4 mCi Flotufolastat (Posluma ) was injected intravenously. Full-ring PET imaging was performed from the skull base to thigh after the radiotracer. CT data was obtained and used for attenuation correction and anatomic localization. COMPARISON:  None Available. FINDINGS: NECK No radiotracer activity in neck lymph nodes. Incidental CT finding: None. CHEST No radiotracer accumulation within mediastinal or hilar lymph nodes. No suspicious pulmonary nodules on the CT scan. Incidental CT finding: Port in the anterior chest wall with tip in distal SVC. ABDOMEN/PELVIS  Prostate: No focal activity the prostate gland. Lymph nodes: No abnormal radiotracer accumulation within pelvic or abdominal nodes. Liver: No evidence of liver metastasis. Incidental CT finding: None. SKELETON Multiple intensely radiotracer avid skeletal metastasis. Example lesion in the inferior superior LEFT pubic ramus with SUV max equal 36 on image 106. No clear CT correlation. Several thoracic spine lesions. Example T10 vertebral body lesion with SUV max 41. There is lucent lesion on the CT portion. Multiple rib lesions with radiotracer avidity. Example broad posterior LEFT rib lesion on image 57 with SUV max equal 30. Lesions involving the LEFT and RIGHT scapula.) For example inferior RIGHT scapular blade with SUV max 38. Additional multiple sclerotic lesions throughout the pelvis spine and ribs without radiotracer activity consistent with treated metastasis IMPRESSION: 1. Multifocal intense radiotracer avid skeletal metastasis consistent active prostate cancer. 2. Background of more diffuse sclerotic lesions many of which do not have radiotracer activity consistent with treated metastasis. 3. No evidence of metastatic adenopathy or visceral metastasis. Electronically Signed   By: Deboraha Fallow M.D.   On: 07/29/2023 14:43

## 2023-08-05 NOTE — Assessment & Plan Note (Signed)
Continue port flush every 6-8 weeks.  

## 2023-08-05 NOTE — Telephone Encounter (Signed)
Pt scheduled on 6/10

## 2023-08-05 NOTE — Assessment & Plan Note (Signed)
Chemotherapy plan as above 

## 2023-08-05 NOTE — Telephone Encounter (Signed)
-----   Message from Timmy Forbes sent at 08/05/2023  1:12 PM EDT ----- I think he will also benefit from bone strengthening treatment. Please advise him to get dental clearance.

## 2023-08-05 NOTE — Telephone Encounter (Signed)
 Referral for Pluvicto eval and mgmt have been placed in [facility].

## 2023-08-05 NOTE — Assessment & Plan Note (Addendum)
 Recommend bisphosphonate, need dental clearance.

## 2023-08-05 NOTE — Assessment & Plan Note (Signed)
Hot flash due to ADT, continue Effexor 37.5mg daily. 

## 2023-08-05 NOTE — Telephone Encounter (Signed)
 Spoke to pt's wife and she states that pt does not have any teeth, he wears dentures.

## 2023-08-05 NOTE — Assessment & Plan Note (Addendum)
 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   NGS molecular studies.-[facility] NGS showed Tp53, TMB 5.3, MSI stable. Patient declined genetic testing.  Labs reviewed and discussed with patient.   PSA has been stable, gradually increasing.   PSMA showed disease progression in bone lesions.  Continue androgen deprivation therapy-Eligard  45 mg every 6 months. Proceed today and next due Dec 2025 Continue darolutamide  600 mg twice daily for now, next line treatment I recommend him to establish care with nuclear medicine for pluvicto treatments. Continue darolutamide  for now to bridge Pluvicto. Refer to palliative care

## 2023-08-05 NOTE — Assessment & Plan Note (Signed)
 Recommend Oxycodone  5mg  Q6 PRN.

## 2023-08-05 NOTE — Addendum Note (Signed)
 Addended by: Craven Do on: 08/05/2023 02:19 PM   Modules accepted: Orders

## 2023-08-05 NOTE — Telephone Encounter (Signed)
 Received fax from [facility] stating that pt is "opioid naive" and they were only able to fill 7 day supply of Oxycodone  5mg . They will need new RX for next time.

## 2023-08-12 ENCOUNTER — Encounter (HOSPITAL_COMMUNITY)
Admission: RE | Admit: 2023-08-12 | Discharge: 2023-08-12 | Disposition: A | Discharge status: Home / Self Care | Source: Ambulatory Visit | Attending: Oncology | Admitting: Oncology

## 2023-08-12 DIAGNOSIS — C61 Malignant neoplasm of prostate: Secondary | ICD-10-CM | POA: Diagnosis not present

## 2023-08-12 NOTE — Consult Note (Signed)
 Chief Complaint: Patient with metastatic castrate resistant prostate cancer. Evaluation for   Lu 177 PSMA therapy (Pluvicto).  Referring Physician(s):Yu    Patient Status: [facility] - Out-pt  History of Present Illness: Cody Vaughan is a 63 y.o. male  who presents with castrate resistant metastatic prostate carcinoma.    Patient was discovered to have abnormal elevated PSA and sclerotic metastasis on routine visit to the emergency department for back pain.  Initial diagnosis in 2022.    Initial biopsy revealed Gleason 5+4=9 prostate adenocarcinoma.    Patient completed 6 cycles taxane chemotherapy on 10/30/2020    Patient started on darolutamide  2023]    Patient had a nadir of PSA less than 1.  Currently PSA mildly rising to 4.4.     Recent PSMA PET scan (07/29/2023) demonstrated multifocal intensely radiotracer avid skeletal metastasis.    Past Medical History:  Diagnosis Date   Prostate cancer (HCC)    2022    Past Surgical History:  Procedure Laterality Date   PORTA CATH INSERTION N/A 07/10/2020   Procedure: PORTA CATH INSERTION;  Surgeon: Celso College, MD;  Location: [facility] INVASIVE CV LAB;  Service: Cardiovascular;  Laterality: N/A;   PROSTATE BIOPSY N/A 06/16/2020   Procedure: PROSTATE BIOPSY;  Surgeon: Lawerence Pressman, MD;  Location: [facility] ORS;  Service: Urology;  Laterality: N/A;   SHOULDER SURGERY     1985   TRANSRECTAL ULTRASOUND N/A 06/16/2020   Procedure: TRANSRECTAL ULTRASOUND;  Surgeon: Lawerence Pressman, MD;  Location: [facility] ORS;  Service: Urology;  Laterality: N/A;    Allergies: Patient has no known allergies.  Medications: Prior to Admission medications   Medication Sig Start Date End Date Taking? Authorizing Provider  acetaminophen  (TYLENOL ) 325 MG tablet Take 650 mg by mouth every 6 (six) hours as needed for moderate pain or mild pain.    [provider]  Calcium  600-400 MG-UNIT CHEW Chew 2 tablets by mouth daily. 08/28/20   Timmy Forbes,  MD  darolutamide  (NUBEQA ) 300 MG tablet Take 2 tablets (600 mg total) by mouth 2 (two) times daily with a meal. 08/05/23   Timmy Forbes, MD  Multiple Vitamins-Minerals (MULTIVITAMIN WITH MINERALS) tablet Take 1 tablet by mouth daily.    [provider]  nicotine  (NICODERM CQ  - DOSED IN MG/24 HOURS) 14 mg/24hr patch PLACE 1 PATCH ONTO THE SKIN DAILY 01/21/22   Timmy Forbes, MD  oxyCODONE  (OXY IR/ROXICODONE ) 5 MG immediate release tablet Take 1 tablet (5 mg total) by mouth every 6 (six) hours as needed for severe pain (pain score 7-10). 08/05/23   Timmy Forbes, MD  venlafaxine  (EFFEXOR ) 37.5 MG tablet Take 1 tablet (37.5 mg total) by mouth daily. 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  Tobacco Use   Smoking status: Former    Current packs/day: 0.00    Average packs/day: 1 pack/day for 45.0 years (45.0 ttl pk-yrs)    Types: Cigarettes    Start date: 07/09/1977    Quit date: 07/10/2022  Years since quitting: 1.0   Smokeless tobacco: Never  Substance and Sexual Activity   Alcohol use: Not Currently   Drug use: Never   Sexual activity: Not Currently  Other Topics Concern   Not on file  Social History Narrative   Not on file   Social Drivers of Health   Financial Resource Strain: Not on file  Food Insecurity: Not on file  Transportation Needs: Not on file  Physical Activity: Not on file  Stress: Not on file  Social Connections: Not on file    ECOG Status: 1 - Symptomatic but completely ambulatory  Review of Systems: A 12 point ROS discussed and pertinent positives are indicated in the HPI above.  All other systems  are negative.  Review of Systems  Back pain.  No incontinence.  Hot flashes   Vital Signs: There were no vitals taken for this visit.  Physical Exam Well appearing.   Imaging:     IMPRESSION: 1. Multifocal intense radiotracer avid skeletal metastasis consistent active prostate cancer. 2. Background of more diffuse sclerotic lesions many of which do not have radiotracer activity consistent with treated metastasis. 3. No evidence of metastatic adenopathy or visceral metastasis.   Labs: PSQ = 4.4 and rising   CBC: Recent Labs    08/16/22 0906 12/02/22 0758 02/18/23 0807 05/19/23 1050  WBC 6.1 7.0 5.2 7.1  HGB 12.8* 12.3* 12.3* 13.1  HCT 37.9* 36.9* 36.2* 39.2  PLT 217 239 222 245    COAGS: No results for input(s): "INR", "APTT" in the last 8760 hours.  BMP: Recent Labs    08/16/22 0906 12/02/22 0758 02/18/23 0807 05/19/23 1050  NA 138 135 136 133*  K 4.0 3.9 3.8 3.9  CL 104 103 101 100  CO2 25 25 26 25   GLUCOSE 117* 136* 139* 126*  BUN 20 16 14 14   CALCIUM  9.1 9.1 8.9 9.1  CREATININE 0.85 0.74 0.85 0.71  GFRNONAA >60 >60 >60 >60    LIVER FUNCTION TESTS: Recent Labs    08/16/22 0906 12/02/22 0758 02/18/23 0807 05/19/23 1050  BILITOT 0.6 0.7 0.3 0.4  AST 32 32 28 26  ALT 40 34 37 34  ALKPHOS 101 100 93 101  PROT 7.4 7.5 6.9 7.2  ALBUMIN 3.9 3.7 3.6 3.8    TUMOR MARKERS: No results for input(s): "AFPTM", "CEA", "CA199", "CHROMOGA" in the last 8760 hours.  Assessment and Plan:  [Patient is good candidate Lu 177 PSMA therapy ( vipivotide tetraxetan).  Patient demonstrates mild-to-moderate progression metastatic skeletal disease] identified on recent PSMA PET scan.  Additionally patient's PSA is gradually increasing.  Patient has demonstrated progression on androgen deprivation and taxane chemotherapy.   Patient explained major and minor risks and benefits of therapy.  Major benefit being progression-free survival.  Major risk being  myelosuppression and renal toxicity. Minor toxicity of xerostomia.  All the patient's questions were answered.  Patient accompanied by daughter who was also present for consult.    Patient is scheduled for 6 treatments spaced 6 weeks apart.  Recommend following up with oncologist for CBC and CMP 1 week prior to each treatment to assess safety of continuing with therapy.      Thank you for this interesting consult.  I greatly enjoyed meeting DIANNA DESHLER and look forward to participating in their care.  A copy of this report was sent to the requesting provider on this date.  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

## 2023-08-14 ENCOUNTER — Other Ambulatory Visit: Payer: Self-pay | Admitting: Oncology

## 2023-08-14 MED ORDER — OXYCODONE HCL 5 MG PO TABS: 5.0000 mg | ORAL_TABLET | Freq: Four times a day (QID) | ORAL | 0 refills | Status: DC | PRN

## 2023-08-20 ENCOUNTER — Telehealth: Payer: Self-pay | Admitting: *Deleted

## 2023-08-20 NOTE — Telephone Encounter (Signed)
 Cody Vaughan says that they are in the process of an appeal for Pluvicto I was given a reference #[id number] I got a call number.  I called the #[phone] spoke to [place] and she told me that right now they are still in the middle of the appeal and at this time they do not need any information from us 

## 2023-08-25 NOTE — Written Directive (Cosign Needed)
  PLUVICTO  THERAPY   RADIOPHARMACEUTICAL: Lutetium 177 vipivotide tetraxetan (Pluvicto)     PRESCRIBED DOSE FOR ADMINISTRATION:  200 mCi   ROUTE OFADMINISTRATION:  IV   DIAGNOSIS: prostate cancer, Prostate cancer (HCC)   REFERRING PHYSICIAN: Babara Call   TREATMENT #: 1   ADDITIONAL PHYSICIAN COMMENTS/NOTES:   AUTHORIZED USER SIGNATURE & TIME STAMP: Norleen GORMAN Boxer, MD   08/26/23    8:22 AM

## 2023-08-26 ENCOUNTER — Encounter (HOSPITAL_COMMUNITY): Payer: Self-pay

## 2023-08-26 ENCOUNTER — Ambulatory Visit (HOSPITAL_COMMUNITY)
Admission: RE | Admit: 2023-08-26 | Discharge: 2023-08-26 | Disposition: A | Discharge status: Home / Self Care | Source: Ambulatory Visit | Attending: Oncology | Admitting: Oncology

## 2023-08-26 VITALS — BP 132/74 | HR 53 | Resp 16

## 2023-08-26 DIAGNOSIS — D649 Anemia, unspecified: Secondary | ICD-10-CM | POA: Diagnosis not present

## 2023-08-26 DIAGNOSIS — C61 Malignant neoplasm of prostate: Secondary | ICD-10-CM | POA: Insufficient documentation

## 2023-08-26 DIAGNOSIS — C7951 Secondary malignant neoplasm of bone: Secondary | ICD-10-CM | POA: Diagnosis not present

## 2023-08-26 DIAGNOSIS — C801 Malignant (primary) neoplasm, unspecified: Secondary | ICD-10-CM | POA: Diagnosis not present

## 2023-08-26 LAB — CBC WITH DIFFERENTIAL/PLATELET
Abs Immature Granulocytes: 0.02 10*3/uL (ref 0.00–0.07)
Basophils Absolute: 0.1 10*3/uL (ref 0.0–0.1)
Basophils Relative: 2 %
Eosinophils Absolute: 0.2 10*3/uL (ref 0.0–0.5)
Eosinophils Relative: 3 %
HCT: 34.1 % — ABNORMAL LOW (ref 39.0–52.0)
Hemoglobin: 11.4 g/dL — ABNORMAL LOW (ref 13.0–17.0)
Immature Granulocytes: 0 %
Lymphocytes Relative: 32 %
Lymphs Abs: 2 10*3/uL (ref 0.7–4.0)
MCH: 31.1 pg (ref 26.0–34.0)
MCHC: 33.4 g/dL (ref 30.0–36.0)
MCV: 93.2 fL (ref 80.0–100.0)
Monocytes Absolute: 0.6 10*3/uL (ref 0.1–1.0)
Monocytes Relative: 10 %
Neutro Abs: 3.3 10*3/uL (ref 1.7–7.7)
Neutrophils Relative %: 53 %
Platelets: 202 10*3/uL (ref 150–400)
RBC: 3.66 MIL/uL — ABNORMAL LOW (ref 4.22–5.81)
RDW: 14 % (ref 11.5–15.5)
WBC: 6.2 10*3/uL (ref 4.0–10.5)
nRBC: 0 % (ref 0.0–0.2)

## 2023-08-26 LAB — BASIC METABOLIC PANEL WITH GFR
Anion gap: 7 (ref 5–15)
BUN: 9 mg/dL (ref 8–23)
CO2: 25 mmol/L (ref 22–32)
Calcium: 8.3 mg/dL — ABNORMAL LOW (ref 8.9–10.3)
Chloride: 106 mmol/L (ref 98–111)
Creatinine, Ser: 0.69 mg/dL (ref 0.61–1.24)
GFR, Estimated: >60 mL/min (ref >60)
Glucose, Bld: 92 mg/dL (ref 70–99)
Potassium: 3.5 mmol/L (ref 3.5–5.1)
Sodium: 138 mmol/L (ref 135–145)

## 2023-08-26 LAB — PSA: Prostatic Specific Antigen: 7.49 ng/mL — ABNORMAL HIGH (ref 0.00–4.00)

## 2023-08-26 MED ORDER — LUTETIUM LU 177 VIPIVOTIDE TET 1000 MBQ/ML IV SOLN
198.9950 | Freq: Once | INTRAVENOUS | Status: AC
  Administered 2023-08-26: 198.995 via INTRAVENOUS

## 2023-08-26 MED ORDER — SODIUM CHLORIDE 0.9 % IV BOLUS
1000.0000 mL | Freq: Once | INTRAVENOUS | Status: AC
  Administered 2023-08-26: 1000 mL via INTRAVENOUS

## 2023-08-26 MED ORDER — ONDANSETRON HCL 4 MG PO TABS: 4.0000 mg | ORAL_TABLET | Freq: Three times a day (TID) | ORAL | 0 refills | Status: AC | PRN

## 2023-08-26 NOTE — Progress Notes (Signed)
 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  Positive PSMA PET scan   EXAM: NUCLEAR MEDICINE PLUVICTO INJECTION  TECHNIQUE: Infusion: The nuclear medicine technologist and I personally verified the dose activity to be delivered as specified in the written directive, and verified the patient identification via 2 separate methods.  Initial flush of the intravenous catheter was performed was sterile saline. The dose syringe was connected to the catheter and the Lu-177 Pluvicto administered over a 1 to 10 min infusion. Single 10 cc  lushes with normal saline follow the dose. No complications were noted. The entire IV tubing, venocatheter, stopcock and syringes was removed in total, placed in a disposal bag and sent for assay of the residual activity, which will be reported at a later time in our EMR by the physics staff. Pressure was applied to the venipuncture site, and a compression bandage placed. Patient monitored for 1 hour following infusion.    Radiation Safety personnel were present to perform the discharge survey, as detailed on their documentation. After a short period of observation, the patient had his IV removed.  RADIOPHARMACEUTICALS: [One hundred ninety-nine] microcuries Lu-177 PLUVICTO  FINDINGS: Current Infusion: [1]  Planned Infusions: 6    Patient presented to nuclear medicine for treatment. The patient's most recent blood counts were reviewed and remains a good candidate to proceed with Lu-177 Pluvicto.    Mild anemia stable with hemoglobin 11.4.  Normal renal function.   PSA equal 7.49 increased from 4.42   Patient reports persistent back pain.         The patient was situated in an infusion suite with a contact barrier placed under the arm. Intravenous access was established, using sterile technique, and a normal saline infusion from a syringe was started.    IMPRESSION: Current Infusion: [1]  Planned  Infusions: 6    [The patient tolerated the infusion well. The patient will return in 6 weeks for ongoing care.]

## 2023-08-28 ENCOUNTER — Encounter (HOSPITAL_COMMUNITY)

## 2023-09-01 ENCOUNTER — Other Ambulatory Visit: Payer: Self-pay | Admitting: Oncology

## 2023-09-02 ENCOUNTER — Encounter: Payer: Self-pay | Admitting: Oncology

## 2023-09-11 ENCOUNTER — Inpatient Hospital Stay: Payer: Self-pay

## 2023-09-11 ENCOUNTER — Inpatient Hospital Stay: Payer: Self-pay | Attending: Oncology

## 2023-09-11 DIAGNOSIS — R232 Flushing: Secondary | ICD-10-CM | POA: Insufficient documentation

## 2023-09-11 DIAGNOSIS — Z95828 Presence of other vascular implants and grafts: Secondary | ICD-10-CM

## 2023-09-11 DIAGNOSIS — C7951 Secondary malignant neoplasm of bone: Secondary | ICD-10-CM | POA: Insufficient documentation

## 2023-09-11 DIAGNOSIS — Z79899 Other long term (current) drug therapy: Secondary | ICD-10-CM | POA: Insufficient documentation

## 2023-09-11 DIAGNOSIS — G893 Neoplasm related pain (acute) (chronic): Secondary | ICD-10-CM | POA: Diagnosis not present

## 2023-09-11 DIAGNOSIS — C61 Malignant neoplasm of prostate: Secondary | ICD-10-CM | POA: Insufficient documentation

## 2023-09-11 DIAGNOSIS — Z87891 Personal history of nicotine dependence: Secondary | ICD-10-CM | POA: Insufficient documentation

## 2023-09-11 LAB — CMP (CANCER CENTER ONLY)
ALT: 25 U/L (ref 0–44)
AST: 28 U/L (ref 15–41)
Albumin: 3.6 g/dL (ref 3.5–5.0)
Alkaline Phosphatase: 129 U/L — ABNORMAL HIGH (ref 38–126)
Anion gap: 7 (ref 5–15)
BUN: 14 mg/dL (ref 8–23)
CO2: 23 mmol/L (ref 22–32)
Calcium: 8.8 mg/dL — ABNORMAL LOW (ref 8.9–10.3)
Chloride: 105 mmol/L (ref 98–111)
Creatinine: 0.79 mg/dL (ref 0.61–1.24)
GFR, Estimated: >60 mL/min (ref >60)
Glucose, Bld: 116 mg/dL — ABNORMAL HIGH (ref 70–99)
Potassium: 3.7 mmol/L (ref 3.5–5.1)
Sodium: 135 mmol/L (ref 135–145)
Total Bilirubin: 0.5 mg/dL (ref 0.0–1.2)
Total Protein: 7 g/dL (ref 6.5–8.1)

## 2023-09-11 LAB — CBC WITH DIFFERENTIAL (CANCER CENTER ONLY)
Abs Immature Granulocytes: 0.02 K/uL (ref 0.00–0.07)
Basophils Absolute: 0.1 K/uL (ref 0.0–0.1)
Basophils Relative: 1 %
Eosinophils Absolute: 0.3 K/uL (ref 0.0–0.5)
Eosinophils Relative: 5 %
HCT: 34 % — ABNORMAL LOW (ref 39.0–52.0)
Hemoglobin: 11.6 g/dL — ABNORMAL LOW (ref 13.0–17.0)
Immature Granulocytes: 0 %
Lymphocytes Relative: 20 %
Lymphs Abs: 1.2 K/uL (ref 0.7–4.0)
MCH: 30.4 pg (ref 26.0–34.0)
MCHC: 34.1 g/dL (ref 30.0–36.0)
MCV: 89 fL (ref 80.0–100.0)
Monocytes Absolute: 0.6 K/uL (ref 0.1–1.0)
Monocytes Relative: 9 %
Neutro Abs: 3.9 K/uL (ref 1.7–7.7)
Neutrophils Relative %: 65 %
Platelet Count: 289 K/uL (ref 150–400)
RBC: 3.82 MIL/uL — ABNORMAL LOW (ref 4.22–5.81)
RDW: 13.2 % (ref 11.5–15.5)
WBC Count: 6.1 K/uL (ref 4.0–10.5)
nRBC: 0 % (ref 0.0–0.2)

## 2023-09-11 LAB — PSA: Prostatic Specific Antigen: 9.61 ng/mL — ABNORMAL HIGH (ref 0.00–4.00)

## 2023-09-11 MED ORDER — HEPARIN SOD (PORK) LOCK FLUSH 100 UNIT/ML IV SOLN
500.0000 [IU] | Freq: Once | INTRAVENOUS | Status: AC
  Administered 2023-09-11: 500 [IU] via INTRAVENOUS
  Filled 2023-09-11: qty 5

## 2023-09-11 MED ORDER — SODIUM CHLORIDE 0.9% FLUSH
10.0000 mL | Freq: Once | INTRAVENOUS | Status: AC
  Administered 2023-09-11: 10 mL via INTRAVENOUS
  Filled 2023-09-11: qty 10

## 2023-09-18 ENCOUNTER — Inpatient Hospital Stay (HOSPITAL_BASED_OUTPATIENT_CLINIC_OR_DEPARTMENT_OTHER): Admitting: Oncology

## 2023-09-18 ENCOUNTER — Inpatient Hospital Stay

## 2023-09-18 ENCOUNTER — Encounter: Payer: Self-pay | Admitting: Oncology

## 2023-09-18 VITALS — BP 135/71 | HR 61 | Temp 97.4°F | Resp 18 | Wt 174.3 lb

## 2023-09-18 DIAGNOSIS — Z95828 Presence of other vascular implants and grafts: Secondary | ICD-10-CM

## 2023-09-18 DIAGNOSIS — R232 Flushing: Secondary | ICD-10-CM

## 2023-09-18 DIAGNOSIS — Z87891 Personal history of nicotine dependence: Secondary | ICD-10-CM | POA: Diagnosis not present

## 2023-09-18 DIAGNOSIS — G893 Neoplasm related pain (acute) (chronic): Secondary | ICD-10-CM | POA: Diagnosis not present

## 2023-09-18 DIAGNOSIS — Z79899 Other long term (current) drug therapy: Secondary | ICD-10-CM | POA: Diagnosis not present

## 2023-09-18 DIAGNOSIS — C61 Malignant neoplasm of prostate: Secondary | ICD-10-CM

## 2023-09-18 DIAGNOSIS — C7951 Secondary malignant neoplasm of bone: Secondary | ICD-10-CM | POA: Diagnosis not present

## 2023-09-18 MED ORDER — VENLAFAXINE HCL 37.5 MG PO TABS: 37.5000 mg | ORAL_TABLET | Freq: Every day | ORAL | 1 refills | Status: DC

## 2023-09-18 MED ORDER — OXYCODONE HCL 5 MG PO TABS: 5.0000 mg | ORAL_TABLET | Freq: Four times a day (QID) | ORAL | 0 refills | Status: DC | PRN

## 2023-09-18 NOTE — Progress Notes (Signed)
 Hematology/Oncology Progress note [phone] [phone]      CHIEF COMPLAINTS/REASON FOR VISIT:  Follow-up for prostate cancer treatment   ASSESSMENT & PLAN:   Cancer Staging  Prostate cancer [facility]) Staging form: Prostate, AJCC 8th Edition - Clinical stage from 06/23/2020: Stage IVB (cT2c, cNX, cM1b, PSA: 678, Grade Group: 5) - Signed by Babara Call, MD on 06/23/2020   Prostate cancer [facility]) Metastatic prostate cancer with bone metastasis,  castration resistant  s/p Docetaxel  x 6 cycles, followed by Darolutamide .   NGS molecular studies.-[facility] NGS showed Tp53, TMB 5.3, MSI stable. Patient declined genetic testing.  Labs reviewed and discussed with patient.   PSA has been stable, gradually increasing.   PSMA showed disease progression in bone lesions.  Continue androgen deprivation therapy-Eligard  45 mg every 6 months- next due Dec 2025 on pluvicto  treatments   Port-A-Cath in place Continue port flush every 6-8 weeks  Vasomotor flushing Hot flash due to ADT, continue Effexor  37.5mg  daily.  Neoplasm related pain Recommend Oxycodone  5-10mg  Q6 PRN.   Orders Placed This Encounter  Procedures   CMP (Cancer Center only)    Standing Status:   Future    Expected Date:   11/13/2023    Expiration Date:   02/11/2024   CBC with Differential (Cancer Center Only)    Standing Status:   Future    Expected Date:   11/13/2023    Expiration Date:   02/11/2024   PSA    Standing Status:   Future    Expected Date:   11/13/2023    Expiration Date:   02/11/2024   CBC with Differential (Cancer Center Only)    Standing Status:   Future    Expected Date:   10/13/2023    Expiration Date:   01/11/2024   PSA    Standing Status:   Future    Expected Date:   10/13/2023    Expiration Date:   01/11/2024   Ambulatory Referral to Palliative Care    Referral Priority:   Routine    Referral Type:   Consultation    Referral Reason:   Goals of Care    Number of Visits Requested:   1    Follow up  4 weeks cbc PSA 8 weeks lab MD   All questions were answered. The patient knows to call the clinic with any problems, questions or concerns.  Call Babara, MD, PhD [facility] Hematology Oncology 09/18/2023    HISTORY OF PRESENTING ILLNESS:  05/27/2020 patient presented to emergency room for evaluation of generalized weakness and and body pain.  Prior to the presentations, patient also had history of tooth abscess and was prescribed antibiotics. Reports acute on chronic right-sided lumbar pain without any prior trauma history.  Profound weakness.  Decreased appetite and loss of weight about 10 pounds during the past few months.  Also complained intermittent right upper quadrant discomfort  326 04/10/2020 CT abdomen pelvis showed a diffusely patchy sclerotic appearance of the osseous structures suspicious for osseous metastatic disease.  Prostate gland is heterogeneous in appearance.  Correlate with PSA.  Patient had a mild superior endplate compression deformity of L4 with less than 10% vertebral body height loss.  H indeterminate.  Fusiform infrarenal abdominal aortic aneurysm measuring up to 3.5 cm.  Follow-up in 2 years. Suspicious findings for early acute uncomplicated appendicitis.  Patient denies any right lower quadrant pain.  In the emergency room, he has a negative Murphy sign.  #06/16/2020, patient is status post prostate biopsy-8 out  of 12 cores pathology  positive for Anicar  adenocarcinoma.  Highest Gleason score 5+4 Postprocedure, patient has had hematuria and hematochezia, bilateral lower extremity pain, AKI orthostatic hypovolemia, sepsis and was admitted and treated.  Patient was discharged on a course of antibiotics.  06/18/2020 MRI thoracic, lumbar, sacrum is compatible with diffuse osseous metastasis in the visualized thoracic lumbar spine, sacrum and bilateral iliac bones.  No definitive dural based tumor is identified.  No compression fracture.  Spine  spondylosis  06/19/2020, patient received loading dose of Firmagon  240 mg x 1. #06/22/2020, bone scan showed widespread osseous metastatic disease involving the appendicular and axial skeleton #April 2022. COVID infection. #07/10/2020, Dr. Marea placed Mediport #07/17/2020-10/30/2020 6 cycles of docetaxel .   #03/06/2021, started on darolutamide   [ARASENS trial, the combination of darolutamide  plus docetaxel  is an approved option for treatment of metastatic CSPC- Improved OS, time to develop castration resistant disease].   #INTERVAL HISTORY DEITRICK FERRERI is a 63 y.o. male who has above history reviewed by me today presents for follow up visit for management of metastatic prostate cancer, 08/26/2023 started on Pleuvicto treatments. Plan Q6 weeks x 6  Chronic back pain after work. + hot flash improved after taking Effexor . He takes oxycodone  3-4 time per day with some relief of symptoms. Patient would also like to see if he can get something stronger for pain or increase dosage of Oxy. Back pain today is 10.    Review of Systems  Constitutional:  Positive for fatigue. Negative for appetite change, chills, fever and unexpected weight change.       Patient walks independently  HENT:   Negative for hearing loss and voice change.   Eyes:  Negative for eye problems and icterus.  Respiratory:  Negative for chest tightness, cough and shortness of breath.   Cardiovascular:  Negative for chest pain and leg swelling.  Gastrointestinal:  Negative for abdominal distention and abdominal pain.  Endocrine: Positive for hot flashes.  Genitourinary:  Negative for difficulty urinating, dysuria and frequency.   Musculoskeletal:  Positive for back pain. Negative for arthralgias.  Skin:  Negative for itching and rash.  Neurological:  Negative for light-headedness and numbness.  Hematological:  Negative for adenopathy. Does not bruise/bleed easily.  Psychiatric/Behavioral:  Negative for confusion.     MEDICAL HISTORY:   Past Medical History:  Diagnosis Date   Prostate cancer (HCC)    2022    SURGICAL HISTORY: Past Surgical History:  Procedure Laterality Date   PORTA CATH INSERTION N/A 07/10/2020   Procedure: PORTA CATH INSERTION;  Surgeon: Marea Selinda GORMAN, MD;  Location: [facility] INVASIVE CV LAB;  Service: Cardiovascular;  Laterality: N/A;   PROSTATE BIOPSY N/A 06/16/2020   Procedure: PROSTATE BIOPSY;  Surgeon: Francisca Redell BROCKS, MD;  Location: [facility] ORS;  Service: Urology;  Laterality: N/A;   SHOULDER SURGERY     1985   TRANSRECTAL ULTRASOUND N/A 06/16/2020   Procedure: TRANSRECTAL ULTRASOUND;  Surgeon: Francisca Redell BROCKS, MD;  Location: [facility] ORS;  Service: Urology;  Laterality: N/A;    SOCIAL HISTORY: Social History   Socioeconomic History   Marital status: Married    Spouse name: Not on file   Number of children: Not on file   Years of education: Not on file   Highest education level: Not on file  Occupational History   Occupation: land scaper    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, 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  Tobacco Use   Smoking status: Former    Current packs/day: 0.00  Average packs/day: 1 pack/day for 45.0 years (45.0 ttl pk-yrs)    Types: Cigarettes    Start date: 07/09/1977    Quit date: 07/10/2022    Years since quitting: 1.1   Smokeless tobacco: Never  Substance and Sexual Activity   Alcohol use: Not Currently   Drug use: Never   Sexual activity: Not Currently  Other Topics Concern   Not on file  Social History Narrative   Not on file   Social Drivers of Health   Financial Resource Strain: Not on file  Food Insecurity: Not on file  Transportation Needs: Not on file  Physical Activity: Not on file  Stress: Not on file  Social Connections: Not on file  Intimate Partner Violence: Not on file    FAMILY HISTORY: Family History  Problem Relation Age of Onset   COPD Mother    Heart attack Father     ALLERGIES:  has no known allergies.  MEDICATIONS:  Current Outpatient Medications  Medication  Sig Dispense Refill   acetaminophen  (TYLENOL ) 325 MG tablet Take 650 mg by mouth every 6 (six) hours as needed for moderate pain or mild pain.     Calcium  600-400 MG-UNIT CHEW Chew 2 tablets by mouth daily. 60 tablet 3   Multiple Vitamins-Minerals (MULTIVITAMIN WITH MINERALS) tablet Take 1 tablet by mouth daily.     ondansetron  (ZOFRAN ) 4 MG tablet Take 1 tablet (4 mg total) by mouth every 8 (eight) hours as needed for nausea or vomiting. 20 tablet 0   nicotine  (NICODERM CQ  - DOSED IN MG/24 HOURS) 14 mg/24hr patch PLACE 1 PATCH ONTO THE SKIN DAILY (Patient not taking: Reported on 09/18/2023) 30 patch 0   oxyCODONE  (OXY IR/ROXICODONE ) 5 MG immediate release tablet Take 1-2 tablets (5-10 mg total) by mouth every 6 (six) hours as needed for severe pain (pain score 7-10). 60 tablet 0   venlafaxine  (EFFEXOR ) 37.5 MG tablet Take 1 tablet (37.5 mg total) by mouth daily. 90 tablet 1   No current facility-administered medications for this visit.   Facility-Administered Medications Ordered in Other Visits  Medication Dose Route Frequency Provider Last Rate Last Admin   heparin  lock flush 100 UNIT/ML injection              PHYSICAL EXAMINATION: ECOG PERFORMANCE STATUS: 0 - Asymptomatic Vitals:   09/18/23 1027  BP: 135/71  Pulse: 61  Resp: 18  Temp: (!) 97.4 F (36.3 C)   Filed Weights   09/18/23 1027  Weight: 174 lb 4.8 oz (79.1 kg)    Physical Exam Constitutional:      General: He is not in acute distress. HENT:     Head: Normocephalic and atraumatic.  Eyes:     General: No scleral icterus. Cardiovascular:     Rate and Rhythm: Normal rate and regular rhythm.     Heart sounds: Normal heart sounds.  Pulmonary:     Effort: Pulmonary effort is normal. No respiratory distress.     Breath sounds: No wheezing.  Abdominal:     General: Bowel sounds are normal. There is no distension.     Palpations: Abdomen is soft.  Musculoskeletal:        General: No deformity. Normal range of motion.      Cervical back: Normal range of motion and neck supple.  Skin:    General: Skin is warm and dry.     Findings: No erythema or rash.  Neurological:     Mental Status: He is alert and oriented  to person, place, and time. Mental status is at baseline.     Cranial Nerves: No cranial nerve deficit.     Coordination: Coordination normal.  Psychiatric:        Mood and Affect: Mood normal.     LABORATORY DATA:  I have reviewed the data as listed    Latest Ref Rng & Units 09/11/2023    7:59 AM 08/26/2023   11:53 AM 05/19/2023   10:50 AM  CBC  WBC 4.0 - 10.5 K/uL 6.1  6.2  7.1   Hemoglobin 13.0 - 17.0 g/dL 88.3  88.5  86.8   Hematocrit 39.0 - 52.0 % 34.0  34.1  39.2   Platelets 150 - 400 K/uL 289  202  245       Latest Ref Rng & Units 09/11/2023    7:59 AM 08/26/2023   11:53 AM 05/19/2023   10:50 AM  CMP  Glucose 70 - 99 mg/dL 883  92  873   BUN 8 - 23 mg/dL 14  9  14    Creatinine 0.61 - 1.24 mg/dL 9.20  9.30  9.28   Sodium 135 - 145 mmol/L 135  138  133   Potassium 3.5 - 5.1 mmol/L 3.7  3.5  3.9   Chloride 98 - 111 mmol/L 105  106  100   CO2 22 - 32 mmol/L 23  25  25    Calcium  8.9 - 10.3 mg/dL 8.8  8.3  9.1   Total Protein 6.5 - 8.1 g/dL 7.0   7.2   Total Bilirubin 0.0 - 1.2 mg/dL 0.5   0.4   Alkaline Phos 38 - 126 U/L 129   101   AST 15 - 41 U/L 28   26   ALT 0 - 44 U/L 25   34      RADIOGRAPHIC STUDIES: I have personally reviewed the radiological images as listed and agreed with the findings in the report. 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 Positive PSMA PET scan EXAM: NUCLEAR MEDICINE PLUVICTO  INJECTION TECHNIQUE: Infusion: The nuclear medicine technologist and I personally verified the dose activity to be delivered as specified in the written directive, and verified the patient identification via 2 separate methods. Initial flush of the intravenous catheter was  performed was sterile saline. The dose syringe was connected to the catheter and the Lu-177 Pluvicto  administered over a 1 to 10 min infusion. Single 10 cc lushes with normal saline follow the dose. No complications were noted. The entire IV tubing, venocatheter, stopcock and syringes was removed in total, placed in a disposal bag and sent for assay of the residual activity, which will be reported at a later time in our EMR by the physics staff. Pressure was applied to the venipuncture site, and a compression bandage placed. Patient monitored for 1 hour following infusion. Radiation Safety personnel were present to perform the discharge survey, as detailed on their documentation. After a short period of observation, the patient had his IV removed. RADIOPHARMACEUTICALS:  One hundred ninety-nine microcuries Lu-177 PLUVICTO  FINDINGS: Current Infusion: 1 Planned Infusions: 6 Patient presented to nuclear medicine for treatment. The patient's most recent blood counts were reviewed and remains a good candidate to proceed with Lu-177 Pluvicto . Mild anemia stable with hemoglobin 11.4.  Normal renal function. PSA equal 7.49 increased from 4.42 Patient reports persistent back pain. The patient was situated in an infusion suite with a contact barrier placed under the arm. Intravenous  access was established, using sterile technique, and a normal saline infusion from a syringe was started. Micro-dosimetry: The prescribed radiation activity was assayed and confirmed to be within specified tolerance. IMPRESSION: Current Infusion: 1 Planned Infusions: 6 The patient tolerated the infusion well. The patient will return in 6 weeks for ongoing care. Electronically Signed   By: Jackquline Boxer M.D.   On: 08/26/2023 14:40   NM Radiologist Eval And Mgmt Result Date: 08/12/2023 EXAM: NEW PATIENT OFFICE VISIT CHIEF COMPLAINT: [age]-year-old male who presents with castrate resistant metastatic prostate carcinoma. Patient was  discovered to have abnormal elevated PSA and sclerotic metastasis on routine visit to the emergency department for back pain. Initial diagnosis in 2022. Initial biopsy revealed Gleason 5+4=9 prostate adenocarcinoma. Patient completed 6 cycles taxane chemotherapy on 10/30/2020 Patient started on darolutamide  2023 Patient had a nadir of PSA less than 1. Currently PSA mildly rising to 4.4. Recent PSMA PET scan (07/29/2023) demonstrated multifocal intensely radiotracer avid skeletal metastasis. HISTORY OF PRESENT ILLNESS: See epic note REVIEW OF SYSTEMS: See epic note PHYSICAL EXAMINATION: See epic note ASSESSMENT AND PLAN: See epic note Electronically Signed   By: Jackquline Boxer M.D.   On: 08/12/2023 16:47   NM PET (PSMA) SKULL TO MID THIGH Result Date: 07/29/2023 CLINICAL DATA:  Prostate carcinoma with biochemical recurrence. Rising PSA equal 4.4. EXAM: NUCLEAR MEDICINE PET SKULL BASE TO THIGH TECHNIQUE: 8.4 mCi Flotufolastat (Posluma ) was injected intravenously. Full-ring PET imaging was performed from the skull base to thigh after the radiotracer. CT data was obtained and used for attenuation correction and anatomic localization. COMPARISON:  None Available. FINDINGS: NECK No radiotracer activity in neck lymph nodes. Incidental CT finding: None. CHEST No radiotracer accumulation within mediastinal or hilar lymph nodes. No suspicious pulmonary nodules on the CT scan. Incidental CT finding: Port in the anterior chest wall with tip in distal SVC. ABDOMEN/PELVIS Prostate: No focal activity the prostate gland. Lymph nodes: No abnormal radiotracer accumulation within pelvic or abdominal nodes. Liver: No evidence of liver metastasis. Incidental CT finding: None. SKELETON Multiple intensely radiotracer avid skeletal metastasis. Example lesion in the inferior superior LEFT pubic ramus with SUV max equal 36 on image 106. No clear CT correlation. Several thoracic spine lesions. Example T10 vertebral body lesion with SUV  max 41. There is lucent lesion on the CT portion. Multiple rib lesions with radiotracer avidity. Example broad posterior LEFT rib lesion on image 57 with SUV max equal 30. Lesions involving the LEFT and RIGHT scapula.) For example inferior RIGHT scapular blade with SUV max 38. Additional multiple sclerotic lesions throughout the pelvis spine and ribs without radiotracer activity consistent with treated metastasis IMPRESSION: 1. Multifocal intense radiotracer avid skeletal metastasis consistent active prostate cancer. 2. Background of more diffuse sclerotic lesions many of which do not have radiotracer activity consistent with treated metastasis. 3. No evidence of metastatic adenopathy or visceral metastasis. Electronically Signed   By: Jackquline Boxer M.D.   On: 07/29/2023 14:43

## 2023-09-18 NOTE — Assessment & Plan Note (Addendum)
 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   NGS molecular studies.-[facility] NGS showed Tp53, TMB 5.3, MSI stable. Patient declined genetic testing.  Labs reviewed and discussed with patient.   PSA has been stable, gradually increasing.   PSMA showed disease progression in bone lesions.  Continue androgen deprivation therapy-Eligard  45 mg every 6 months- next due Dec 2025 on pluvicto  treatments

## 2023-09-18 NOTE — Assessment & Plan Note (Signed)
Hot flash due to ADT, continue Effexor 37.5mg daily. 

## 2023-09-18 NOTE — Progress Notes (Signed)
 Pt here for follow up. He reports that appetite has decreased since he started Pluvicto  tx. Patient would also like to see if he can get something stronger for pain or increase dosage of Oxy. Back pain today is 10.

## 2023-09-18 NOTE — Assessment & Plan Note (Signed)
Continue port flush every 6-8 weeks.  

## 2023-09-18 NOTE — Assessment & Plan Note (Signed)
 Recommend Oxycodone  5-10mg  Q6 PRN.

## 2023-10-06 ENCOUNTER — Other Ambulatory Visit: Payer: Self-pay | Admitting: Oncology

## 2023-10-08 ENCOUNTER — Encounter: Payer: Self-pay | Admitting: Oncology

## 2023-10-13 ENCOUNTER — Inpatient Hospital Stay (HOSPITAL_BASED_OUTPATIENT_CLINIC_OR_DEPARTMENT_OTHER): Admitting: Hospice and Palliative Medicine

## 2023-10-13 ENCOUNTER — Inpatient Hospital Stay: Attending: Oncology

## 2023-10-13 ENCOUNTER — Inpatient Hospital Stay

## 2023-10-13 ENCOUNTER — Encounter: Payer: Self-pay | Admitting: Hospice and Palliative Medicine

## 2023-10-13 VITALS — BP 141/80 | HR 65 | Temp 96.5°F | Resp 17 | Wt 170.0 lb

## 2023-10-13 DIAGNOSIS — Z515 Encounter for palliative care: Secondary | ICD-10-CM

## 2023-10-13 DIAGNOSIS — Z791 Long term (current) use of non-steroidal anti-inflammatories (NSAID): Secondary | ICD-10-CM | POA: Insufficient documentation

## 2023-10-13 DIAGNOSIS — Z87891 Personal history of nicotine dependence: Secondary | ICD-10-CM | POA: Insufficient documentation

## 2023-10-13 DIAGNOSIS — G893 Neoplasm related pain (acute) (chronic): Secondary | ICD-10-CM

## 2023-10-13 DIAGNOSIS — C61 Malignant neoplasm of prostate: Secondary | ICD-10-CM | POA: Diagnosis not present

## 2023-10-13 DIAGNOSIS — Z79899 Other long term (current) drug therapy: Secondary | ICD-10-CM | POA: Diagnosis not present

## 2023-10-13 DIAGNOSIS — C7951 Secondary malignant neoplasm of bone: Secondary | ICD-10-CM | POA: Diagnosis not present

## 2023-10-13 LAB — CBC WITH DIFFERENTIAL (CANCER CENTER ONLY)
Abs Immature Granulocytes: 0.03 K/uL (ref 0.00–0.07)
Basophils Absolute: 0.1 K/uL (ref 0.0–0.1)
Basophils Relative: 2 %
Eosinophils Absolute: 0.4 K/uL (ref 0.0–0.5)
Eosinophils Relative: 5 %
HCT: 36.2 % — ABNORMAL LOW (ref 39.0–52.0)
Hemoglobin: 12.3 g/dL — ABNORMAL LOW (ref 13.0–17.0)
Immature Granulocytes: 0 %
Lymphocytes Relative: 29 %
Lymphs Abs: 2 K/uL (ref 0.7–4.0)
MCH: 30.4 pg (ref 26.0–34.0)
MCHC: 34 g/dL (ref 30.0–36.0)
MCV: 89.4 fL (ref 80.0–100.0)
Monocytes Absolute: 0.8 K/uL (ref 0.1–1.0)
Monocytes Relative: 11 %
Neutro Abs: 3.6 K/uL (ref 1.7–7.7)
Neutrophils Relative %: 53 %
Platelet Count: 216 K/uL (ref 150–400)
RBC: 4.05 MIL/uL — ABNORMAL LOW (ref 4.22–5.81)
RDW: 14 % (ref 11.5–15.5)
WBC Count: 6.8 K/uL (ref 4.0–10.5)
nRBC: 0 % (ref 0.0–0.2)

## 2023-10-13 LAB — PSA: Prostatic Specific Antigen: 7.3 ng/mL — ABNORMAL HIGH (ref 0.00–4.00)

## 2023-10-13 MED ORDER — MELOXICAM 7.5 MG PO TABS: 7.5000 mg | ORAL_TABLET | Freq: Every day | ORAL | 2 refills | Status: AC

## 2023-10-13 MED ORDER — OXYCODONE HCL 10 MG PO TABS: 5.0000 mg | ORAL_TABLET | ORAL | 0 refills | Status: DC | PRN

## 2023-10-13 NOTE — Progress Notes (Signed)
 [facility] Clinical Social Work  Initial Assessment   Cody Vaughan is a 63 y.o. year old male contacted caregiver by phone. Clinical Social Work was referred by medical provider for assessment of psychosocial needs.   SDOH (Social Determinants of Health) assessments performed: Yes   SDOH Screenings   Depression (PHQ2-9): Low Risk  (10/13/2023)  Tobacco Use: Medium Risk (10/13/2023)     Distress Screen completed: No    06/07/2020   12:10 PM  ONCBCN DISTRESS SCREENING  Screening Type Initial Screening  How much distress have you been experiencing in the past week? (0-10) 3   Practical concerns type Insurance   Information Concerns Type Lack of info about diagnosis;Lack of info about treatment  Physical Concerns Type  Pain;Loss of appetitie      Data saved with a previous flowsheet row definition      Family/Social Information:  Housing Arrangement: patient lives with his wife, Cody Vaughan. Family members/support persons in your life? Family and Friends Transportation concerns: no  Employment: Working full time.  Patient has his own landscaping business. Income source: Employment Financial concerns: No Type of concern: None Food access concerns: no Religious or spiritual practice: Yes-Billie expressed their strong faith. Advanced directives: No Services Currently in place:  Medicare  Coping/ Adjustment to diagnosis: Patient understands treatment plan and what happens next? yes Concerns about diagnosis and/or treatment: Pain or discomfort during procedures Patient reported stressors: Physical issues Hopes and/or priorities: To continue working. Patient enjoys gardening Current coping skills/ strengths: Capable of independent living , Communication skills , Contractor , General fund of knowledge , Motivation for treatment/growth , Religious Affiliation , Special hobby/interest , and Supportive family/friends     SUMMARY: Current SDOH Barriers:  None per patient's  wife.  Clinical Social Work Clinical Goal(s):  No clinical social work goals at this time  Interventions: Discussed common feeling and emotions when being diagnosed with cancer, and the importance of support during treatment Informed patient of the support team roles and support services at [facility] Provided CSW contact information and encouraged patient to call with any questions or concerns Provided patient with information about Advance Directives process.    Follow Up Plan: Patient will contact CSW with any support or resource needs Patient verbalizes understanding of plan: Yes    Macario CHRISTELLA Au, LCSW Clinical Social Worker [facility]

## 2023-10-13 NOTE — Progress Notes (Signed)
 Concerns today of back pain and hot flashes

## 2023-10-13 NOTE — Progress Notes (Signed)
 Palliative Medicine [facility] at [facility] Telephone:([phone] [phone]) [phone]   Name: Cody Vaughan Date: 10/13/2023 MRN: [id number]  DOB: December 03, 1960  Patient Care Team: Patient, No Pcp Per as PCP - General (General Practice) Babara Call, MD as Consulting Physician (Oncology)    REASON FOR CONSULTATION: Cody Vaughan is a 63 y.o. 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  Patient has had ongoing pain.  He is referred to palliative care to address goals and manage ongoing symptoms.  SOCIAL HISTORY:     reports that he quit smoking about 15 months ago. His smoking use included cigarettes. He started smoking about 46 years ago. He has a 45 pack-year smoking history. He has never used smokeless tobacco. He reports that he does not currently use alcohol. He reports that he does not use drugs.  Patient is married and lives at home with his wife.  He had 2 biological sons who are both deceased.  Patient has stepchildren.  He still works as a Administrator.  ADVANCE DIRECTIVES:    CODE STATUS:   PAST MEDICAL HISTORY: Past Medical History:  Diagnosis Date   Prostate cancer (HCC)    2022    PAST SURGICAL HISTORY:  Past Surgical History:  Procedure Laterality Date   PORTA CATH INSERTION N/A 07/10/2020   Procedure: PORTA CATH INSERTION;  Surgeon: Marea Selinda GORMAN, MD;  Location: [facility] INVASIVE CV LAB;  Service: Cardiovascular;  Laterality: N/A;   PROSTATE BIOPSY N/A 06/16/2020   Procedure: PROSTATE BIOPSY;  Surgeon: Francisca Redell BROCKS, MD;  Location: [facility] ORS;  Service: Urology;  Laterality: N/A;   SHOULDER SURGERY     1985   TRANSRECTAL ULTRASOUND N/A 06/16/2020   Procedure: TRANSRECTAL ULTRASOUND;  Surgeon: Francisca Redell BROCKS, MD;  Location: [facility] ORS;  Service: Urology;  Laterality: N/A;    HEMATOLOGY/ONCOLOGY HISTORY:  Oncology History  Prostate cancer (HCC)  06/14/2020 Initial Diagnosis   Prostate  cancer  -05/27/2020 patient presented to emergency room for evaluation of generalized weakness and and body pain.  Prior to the presentations, patient also had history of tooth abscess and was prescribed antibiotics. Reports acute on chronic right-sided lumbar pain without any prior trauma history.  Profound weakness.  Decreased appetite and loss of weight about 10 pounds during the past few months.  Also complained intermittent right upper quadrant discomfort  -06/16/2020, patient is status post prostate biopsy-8 out of 12 cores pathology  positive for Anicar  adenocarcinoma.  Highest Gleason score 5+4 -06/20/2019, patient received loading dose of Firmagon  240 mg   06/18/2020 Imaging   MRI thoracic, lumbar, sacrum is compatible with diffuse osseous metastasis in the visualized thoracic lumbar spine, sacrum and bilateral iliac bones.  No definitive dural based tumor is identified.  No compression fracture.  Spine spondylosis   06/19/2020 Imaging   bone scan showed widespread osseous metastatic disease involving the appendicular and axial skeleton   06/23/2020 Cancer Staging   Staging form: Prostate, AJCC 8th Edition - Clinical stage from 06/23/2020: Stage IVB (cT2c, cNX, cM1b, PSA: 678, Grade Group: 5) - Signed by Babara Call, MD on 06/23/2020 Prostate specific antigen (PSA) range: 20 or greater Histologic grading system: 5 grade system   07/10/2020 Procedure   Dr. Marea placed Mediport   07/17/2020 -  Chemotherapy   DOCETAXEL  Q21D x 6       08/07/2020 - 08/18/2020 Radiation Therapy   Pelvis Radiation   03/06/2021 -  Chemotherapy   started on darolutamide   [ARASENS trial, the combination of darolutamide  plus docetaxel  is an approved option for treatment of metastatic CSPC- Improved OS, time to develop castration resistant disease].      ALLERGIES:  has no known allergies.  MEDICATIONS:  Current Outpatient Medications  Medication Sig Dispense Refill   acetaminophen  (TYLENOL ) 325 MG tablet Take 650  mg by mouth every 6 (six) hours as needed for moderate pain or mild pain.     Calcium  600-400 MG-UNIT CHEW Chew 2 tablets by mouth daily. 60 tablet 3   Multiple Vitamins-Minerals (MULTIVITAMIN WITH MINERALS) tablet Take 1 tablet by mouth daily.     ondansetron  (ZOFRAN ) 4 MG tablet Take 1 tablet (4 mg total) by mouth every 8 (eight) hours as needed for nausea or vomiting. 20 tablet 0   oxyCODONE  (OXY IR/ROXICODONE ) 5 MG immediate release tablet Take 1 tablet (5 mg total) by mouth every 6 (six) hours as needed for severe pain (pain score 7-10). 60 tablet 0   venlafaxine  (EFFEXOR ) 37.5 MG tablet Take 1 tablet (37.5 mg total) by mouth daily. 90 tablet 1   nicotine  (NICODERM CQ  - DOSED IN MG/24 HOURS) 14 mg/24hr patch PLACE 1 PATCH ONTO THE SKIN DAILY (Patient not taking: Reported on 10/13/2023) 30 patch 0   No current facility-administered medications for this visit.   Facility-Administered Medications Ordered in Other Visits  Medication Dose Route Frequency Provider Last Rate Last Admin   heparin  lock flush 100 UNIT/ML injection             VITAL SIGNS: BP (!) 141/80 (Patient Position: Sitting)   Pulse 65   Temp (!) 96.5 F (35.8 C) (Tympanic)   Resp 17   Wt 170 lb (77.1 kg)   SpO2 100%   BMI 23.71 kg/m  Filed Weights   10/13/23 0846  Weight: 170 lb (77.1 kg)    Estimated body mass index is 23.71 kg/m as calculated from the following:   Height as of 11/27/21: 5' 11 (1.803 m).   Weight as of this encounter: 170 lb (77.1 kg).  LABS: CBC:    Component Value Date/Time   WBC 6.8 10/13/2023 0810   WBC 6.2 08/26/2023 1153   HGB 12.3 (L) 10/13/2023 0810   HGB 12.6 (L) 09/07/2012 0431   HCT 36.2 (L) 10/13/2023 0810   HCT 36.5 (L) 09/07/2012 0431   PLT 216 10/13/2023 0810   PLT 288 09/07/2012 0431   MCV 89.4 10/13/2023 0810   MCV 89 09/07/2012 0431   NEUTROABS 3.6 10/13/2023 0810   NEUTROABS 6.2 09/07/2012 0431   LYMPHSABS 2.0 10/13/2023 0810   LYMPHSABS 1.8 09/07/2012 0431    MONOABS 0.8 10/13/2023 0810   MONOABS 1.6 (H) 09/07/2012 0431   EOSABS 0.4 10/13/2023 0810   EOSABS 0.3 09/07/2012 0431   BASOSABS 0.1 10/13/2023 0810   BASOSABS 0.1 09/07/2012 0431   Comprehensive Metabolic Panel:    Component Value Date/Time   NA 135 09/11/2023 0759   NA 142 09/06/2012 0429   K 3.7 09/11/2023 0759   K 3.5 09/06/2012 0429   CL 105 09/11/2023 0759   CL 106 09/06/2012 0429   CO2 23 09/11/2023 0759   CO2 28 09/06/2012 0429   BUN 14 09/11/2023 0759   BUN 9 09/06/2012 0429   CREATININE 0.79 09/11/2023 0759   CREATININE 0.97 09/06/2012 0429   GLUCOSE 116 (H) 09/11/2023 0759   GLUCOSE 105 (H) 09/06/2012 0429   CALCIUM  8.8 (L) 09/11/2023 0759   CALCIUM   8.5 09/06/2012 0429   AST 28 09/11/2023 0759   ALT 25 09/11/2023 0759   ALT 43 09/02/2012 0658   ALKPHOS 129 (H) 09/11/2023 0759   ALKPHOS 71 09/02/2012 0658   BILITOT 0.5 09/11/2023 0759   PROT 7.0 09/11/2023 0759   PROT 5.9 (L) 09/02/2012 0658   ALBUMIN 3.6 09/11/2023 0759   ALBUMIN 2.6 (L) 09/02/2012 9341    RADIOGRAPHIC STUDIES: No results found.  PERFORMANCE STATUS (ECOG) : 1 - Symptomatic but completely ambulatory  Review of Systems Unless otherwise noted, a complete review of systems is negative.  Physical Exam General: NAD Pulmonary: Unlabored Abdomen: soft, nontender, + bowel sounds GU: no suprapubic tenderness Extremities: no edema, no joint deformities Skin: no rashes Neurological: Weakness but otherwise nonfocal  IMPRESSION: I met with patient today.  I introduced palliative care services.  Patient says that he recognizes that his metastatic prostate cancer is incurable.  He is in agreement with current scope of treatment.  Patient is currently receiving Pluvicto  in [place] and has had 1 treatment and tolerated it well so far.  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  Patient is known to have skeletal/spinal metastasis from prostate  cancer.  Patient is taking oxycodone , which he recently increased to 10 mg every 6 hours and is found that to have improved his pain.  Patient says he is still waking patiently at night.  Will switch from oxycodone  5 mg to 10 mg tablets to reduce pill burden.  Will also trial low-dose meloxicam  given likely inflammatory component to pain from bone metastasis.  Patient denies any adverse effects from pain medications.  Discussed importance of preventing opioid-induced constipation as well as safe storage and administration of opioids and patient was instructed to avoid driving or operating machinery on opioids.  Patient does not have advanced directives but is interested in establishing those.  I sent him home with ACP documents and MOST form to review with his wife.  PLAN: - Continue current scope of treatment - Increase oxycodone  10 mg every 6 hours #60 - Start meloxicam  7.5 mg daily - Can consider starting a long-acting opioid if needed - Daily bowel regimen - ACP/MOST form reviewed - Referral to social work - Referral to [facility] (on venlafaxine ) - RTC 1 month   Patient expressed understanding and was in agreement with this plan. He also understands that He can call the clinic at any time with any questions, concerns, or complaints.     Time Total: 25 minutes  Visit consisted of counseling and education dealing with the complex and emotionally intense issues of symptom management and palliative care in the setting of serious and potentially life-threatening illness.Greater than 50%  of this time was spent counseling and coordinating care related to the above assessment and plan.  Signed by: Fonda Mower, PhD, NP-C

## 2023-10-15 ENCOUNTER — Other Ambulatory Visit: Payer: Self-pay | Admitting: Oncology

## 2023-10-15 DIAGNOSIS — C61 Malignant neoplasm of prostate: Secondary | ICD-10-CM

## 2023-10-15 NOTE — Written Directive (Addendum)
  PLUVICTO   THERAPY   RADIOPHARMACEUTICAL: Lutetium 177 vipivotide tetraxetan (Pluvicto )     PRESCRIBED DOSE FOR ADMINISTRATION:  200 mCi   ROUTE OFADMINISTRATION:  IV   DIAGNOSIS: prostate cancer, Prostate cancer (HCC)   REFERRING PHYSICIAN: Babara Call, MD   TREATMENT #: 2   ADDITIONAL PHYSICIAN COMMENTS/NOTES:   AUTHORIZED USER SIGNATURE & TIME STAMP: Norleen GORMAN Boxer, MD   10/16/23    8:16 AM

## 2023-10-16 ENCOUNTER — Encounter (HOSPITAL_COMMUNITY)
Admission: RE | Admit: 2023-10-16 | Discharge: 2023-10-16 | Disposition: A | Discharge status: Home / Self Care | Source: Ambulatory Visit | Attending: Oncology | Admitting: Oncology

## 2023-10-16 ENCOUNTER — Encounter: Payer: Self-pay | Admitting: Oncology

## 2023-10-16 DIAGNOSIS — C61 Malignant neoplasm of prostate: Secondary | ICD-10-CM | POA: Insufficient documentation

## 2023-10-16 MED ORDER — SODIUM CHLORIDE 0.9 % IV BOLUS: 1000.0000 mL | Freq: Once | INTRAVENOUS | Status: DC

## 2023-10-16 MED ORDER — LUTETIUM LU 177 VIPIVOTIDE TET 1000 MBQ/ML IV SOLN
206.9802 | Freq: Once | INTRAVENOUS | Status: AC
  Administered 2023-10-16: 206.9802 via INTRAVENOUS

## 2023-10-16 NOTE — Progress Notes (Signed)
 The patient enrolled in services on 10/14/2023 and initial behavioral health evaluation is scheduled for 10/22/2023.

## 2023-10-17 NOTE — Progress Notes (Signed)
 CLINICAL DATA: [63 year old male with castrate resistant metastatic prostate carcinoma]  EXAM: NUCLEAR MEDICINE PLUVICTO  INJECTION  TECHNIQUE: Infusion: The nuclear medicine technologist and I personally verified the dose activity to be delivered as specified in the written directive, and verified the patient identification via 2 separate methods.  Initial flush of the intravenous catheter was performed was sterile saline. The dose syringe was connected to the catheter and the Lu-177 Pluvicto  administered over a 1 to 10 min infusion. Single 10 cc  lushes with normal saline follow the dose. No complications were noted. The entire IV tubing, venocatheter, stopcock and syringes was removed in total, placed in a disposal bag and sent for assay of the residual activity, which will be reported at a later time in our EMR by the physics staff. Pressure was applied to the venipuncture site, and a compression bandage placed. Patient monitored for 1 hour following infusion.    Radiation Safety personnel were present to perform the discharge survey, as detailed on their documentation. After a short period of observation, the patient had his IV removed.  RADIOPHARMACEUTICALS: [Two hundred seven] microcuries Lu-177 PLUVICTO   FINDINGS: Current Infusion: [2]  Planned Infusions: 6    Patient presented to nuclear medicine for treatment.     Patient reports no interval adverse effects from initial therapy.  No mild suppression renal toxicity.     PSA had initial mild elevation and subsequent decrease most consistent with a mild flare response. SABRA  PSA currently equal 7.      The patient's most recent blood counts were reviewed and remains a good candidate to proceed with Lu-177 Pluvicto . The patient was situated in an infusion suite with a contact barrier placed under the arm. Intravenous access was established, using sterile technique, and a normal saline infusion from a syringe was started.      Micro-dosimetry: The prescribed radiation activity was assayed and confirmed to be within specified tolerance.  IMPRESSION: Current Infusion: [2]  Planned Infusions: 6    [The patient tolerated the infusion well. The patient will return in one month for ongoing care.]

## 2023-10-22 ENCOUNTER — Ambulatory Visit
Admission: RE | Admit: 2023-10-22 | Discharge: 2023-10-22 | Disposition: A | Discharge status: Home / Self Care | Source: Ambulatory Visit

## 2023-10-22 VITALS — BP 97/62 | HR 80 | Temp 98.7°F | Resp 18

## 2023-10-22 DIAGNOSIS — R509 Fever, unspecified: Secondary | ICD-10-CM | POA: Diagnosis not present

## 2023-10-22 NOTE — ED Triage Notes (Signed)
 Patient reports fever of 100.3 at 10:00 pm last night. Patient did not take anything for fever. Patient also reports body aches and chills. Patient had a cough yesterday morning but has subsided today. Rates Body aches 8/10. Patient states his body aches could come from lifting heavy objects.

## 2023-10-22 NOTE — Discharge Instructions (Addendum)
 Was unable to print  AVS at time of d/c

## 2023-10-22 NOTE — ED Provider Notes (Signed)
 Cody Vaughan    CSN: [id number] Arrival date & time: 10/22/23  1633      History   Chief Complaint Chief Complaint  Patient presents with   Chills    Chills, low grade fever, chest and groin pain.  Gas nuclear medicine treatment Thursday with adr. Annis at [facility].  His office recommended Subhan come to urgent care to be checked. - Entered by patient   Generalized Body Aches   Fever    HPI Cody Vaughan is a 63 y.o. male.    Patient with fever peaking at 1.5, chills, generalized weakness, generalized bodyaches and a nonproductive cough beginning 1 day ago.  Cough has resolved and symptoms have improved.  Has not experienced fever since.  Has not attempted treatment.  History of prostate cancer and bone metastasis, received chemotherapy medication lutetium 5 days ago, fever known side effect, typically experiencing fatigue and bodyaches after.  Denies ear pain, sore throat, nasal or chest congestion, shortness of breath, wheezing, nausea vomiting or diarrhea or urinary symptoms.  No known sick contact prior.  Decreased appetite but at baseline.  Past Medical History:  Diagnosis Date   Prostate cancer (HCC)    2022    Patient Active Problem List   Diagnosis Date Noted   Vasomotor flushing 02/14/2022   Tobacco use 11/15/2021   Neoplasm related pain 08/28/2020   Port-A-Cath in place 08/28/2020   Encounter for antineoplastic chemotherapy 07/24/2020   Loss of weight 06/23/2020   Post-operative pain    Androgen deprivation therapy    Palliative care encounter    Pericardial effusion 06/18/2020   Fall 06/17/2020   Hematuria 06/17/2020   Postural dizziness with presyncope 06/16/2020   Prostate cancer (HCC) 06/14/2020   Abnormal CT scan 06/07/2020   Elevated alkaline phosphatase level 06/07/2020   Goals of care, counseling/discussion 06/07/2020   Bone lesion 06/07/2020   Epididymo-orchitis with abscess 09/16/2012   Febrile urinary tract infection 09/16/2012    Scrotal wall abscess 09/16/2012    Past Surgical History:  Procedure Laterality Date   PORTA CATH INSERTION N/A 07/10/2020   Procedure: PORTA CATH INSERTION;  Surgeon: Marea Selinda GORMAN, MD;  Location: [facility] INVASIVE CV LAB;  Service: Cardiovascular;  Laterality: N/A;   PROSTATE BIOPSY N/A 06/16/2020   Procedure: PROSTATE BIOPSY;  Surgeon: Francisca Redell BROCKS, MD;  Location: [facility] ORS;  Service: Urology;  Laterality: N/A;   SHOULDER SURGERY     1985   TRANSRECTAL ULTRASOUND N/A 06/16/2020   Procedure: TRANSRECTAL ULTRASOUND;  Surgeon: Francisca Redell BROCKS, MD;  Location: [facility] ORS;  Service: Urology;  Laterality: N/A;       Home Medications    Prior to Admission medications   Medication Sig Start Date End Date Taking? Authorizing Provider  Lutetium Lu 177 Vipivotide Tet (PLUVICTO  IV) Inject into the vein.   Yes [provider]  acetaminophen  (TYLENOL ) 325 MG tablet Take 650 mg by mouth every 6 (six) hours as needed for moderate pain or mild pain.    [provider]  Calcium  600-400 MG-UNIT CHEW Chew 2 tablets by mouth daily. 08/28/20   Babara Call, MD  meloxicam  (MOBIC ) 7.5 MG tablet Take 1 tablet (7.5 mg total) by mouth daily. 10/13/23   Borders, Fonda SAUNDERS, NP  Multiple Vitamins-Minerals (MULTIVITAMIN WITH MINERALS) tablet Take 1 tablet by mouth daily.    [provider]  nicotine  (NICODERM CQ  - DOSED IN MG/24 HOURS) 14 mg/24hr patch PLACE 1 PATCH ONTO THE SKIN DAILY Patient not taking:  Reported on 10/13/2023 01/21/22   Babara Call, MD  ondansetron  (ZOFRAN ) 4 MG tablet Take 1 tablet (4 mg total) by mouth every 8 (eight) hours as needed for nausea or vomiting. 08/26/23   Malva PARAS, MD  Oxycodone  HCl 10 MG TABS Take 0.5-1 tablets (5-10 mg total) by mouth every 4 (four) hours as needed (pain). 10/13/23   Borders, Fonda SAUNDERS, NP  venlafaxine  (EFFEXOR ) 37.5 MG tablet Take 1 tablet (37.5 mg total) by mouth daily. 09/18/23   Babara Call, MD    Family History Family History  Problem Relation Age  of Onset   COPD Mother    Heart attack Father     Social History Social History   Tobacco Use   Smoking status: Former    Current packs/day: 0.00    Average packs/day: 1 pack/day for 45.0 years (45.0 ttl pk-yrs)    Types: Cigarettes    Start date: 07/09/1977    Quit date: 07/10/2022    Years since quitting: 1.2   Smokeless tobacco: Never  Substance Use Topics   Alcohol use: Not Currently   Drug use: Never     Allergies   Patient has no known allergies.   Review of Systems Review of Systems  Constitutional:  Positive for fever.     Physical Exam Triage Vital Signs ED Triage Vitals  Encounter Vitals Group     BP 10/22/23 1650 97/62     Girls Systolic BP Percentile --      Girls Diastolic BP Percentile --      Boys Systolic BP Percentile --      Boys Diastolic BP Percentile --      Pulse Rate 10/22/23 1650 80     Resp 10/22/23 1650 18     Temp 10/22/23 1650 98.7 F (37.1 C)     Temp Source 10/22/23 1650 Oral     SpO2 10/22/23 1650 97 %     Weight --      Height --      Head Circumference --      Peak Flow --      Pain Score 10/22/23 1655 8     Pain Loc --      Pain Education --      Exclude from Growth Chart --    No data found.  Updated Vital Signs BP 97/62 (BP Location: Right Arm)   Pulse 80   Temp 98.7 F (37.1 C) (Oral)   Resp 18   SpO2 97%   Visual Acuity Right Eye Distance:   Left Eye Distance:   Bilateral Distance:    Right Eye Near:   Left Eye Near:    Bilateral Near:     Physical Exam Constitutional:      Appearance: Normal appearance.  HENT:     Head: Normocephalic.     Right Ear: Tympanic membrane, ear canal and external ear normal.     Left Ear: Tympanic membrane, ear canal and external ear normal.     Nose: Nose normal.     Mouth/Throat:     Mouth: Mucous membranes are moist.     Pharynx: Oropharynx is clear.  Eyes:     Extraocular Movements: Extraocular movements intact.  Cardiovascular:     Rate and Rhythm: Normal rate  and regular rhythm.     Pulses: Normal pulses.     Heart sounds: Normal heart sounds.  Pulmonary:     Effort: Pulmonary effort is normal.     Breath sounds: Normal  breath sounds.  Musculoskeletal:     Cervical back: Normal range of motion and neck supple.  Neurological:     General: No focal deficit present.     Mental Status: He is alert and oriented to person, place, and time. Mental status is at baseline.      UC Treatments / Results  Labs (all labs ordered are listed, but only abnormal results are displayed) Labs Reviewed - No data to display  EKG   Radiology No results found.  Procedures Procedures (including critical care time)  Medications Ordered in UC Medications - No data to display  Initial Impression / Assessment and Plan / UC Course  I have reviewed the triage vital signs and the nursing notes.  Pertinent labs & imaging results that were available during my care of the patient were reviewed by me and considered in my medical decision making (see chart for details).  Fever  Vital signs are stable, patient is in no signs of distress nor toxic appearing, COVID and flu testing negative, discussed findings, plan most likely side effect of medicine but advised to monitor, may take Tylenol  as needed with continued use of daily medication, for any further concerns may follow-up with urgent care or primary doctor Final Clinical Impressions(s) / UC Diagnoses   Final diagnoses:  Fever, unspecified     Discharge Instructions      Was unable to print  AVS at time of d/c      ED Prescriptions   None    PDMP not reviewed this encounter.   Teresa Shelba SAUNDERS, NP 10/22/23 1726

## 2023-11-13 ENCOUNTER — Inpatient Hospital Stay: Admitting: Oncology

## 2023-11-13 ENCOUNTER — Encounter: Payer: Self-pay | Admitting: Oncology

## 2023-11-13 ENCOUNTER — Inpatient Hospital Stay: Attending: Oncology

## 2023-11-13 ENCOUNTER — Inpatient Hospital Stay (HOSPITAL_BASED_OUTPATIENT_CLINIC_OR_DEPARTMENT_OTHER): Admitting: Hospice and Palliative Medicine

## 2023-11-13 VITALS — BP 139/79 | HR 61 | Temp 98.6°F | Resp 20 | Wt 166.7 lb

## 2023-11-13 DIAGNOSIS — C7951 Secondary malignant neoplasm of bone: Secondary | ICD-10-CM | POA: Diagnosis not present

## 2023-11-13 DIAGNOSIS — R232 Flushing: Secondary | ICD-10-CM

## 2023-11-13 DIAGNOSIS — Z791 Long term (current) use of non-steroidal anti-inflammatories (NSAID): Secondary | ICD-10-CM | POA: Diagnosis not present

## 2023-11-13 DIAGNOSIS — D649 Anemia, unspecified: Secondary | ICD-10-CM | POA: Insufficient documentation

## 2023-11-13 DIAGNOSIS — C61 Malignant neoplasm of prostate: Secondary | ICD-10-CM | POA: Insufficient documentation

## 2023-11-13 DIAGNOSIS — G893 Neoplasm related pain (acute) (chronic): Secondary | ICD-10-CM | POA: Diagnosis not present

## 2023-11-13 DIAGNOSIS — Z515 Encounter for palliative care: Secondary | ICD-10-CM | POA: Diagnosis not present

## 2023-11-13 DIAGNOSIS — Z87891 Personal history of nicotine dependence: Secondary | ICD-10-CM | POA: Insufficient documentation

## 2023-11-13 DIAGNOSIS — Z79899 Other long term (current) drug therapy: Secondary | ICD-10-CM | POA: Diagnosis not present

## 2023-11-13 DIAGNOSIS — Z720 Tobacco use: Secondary | ICD-10-CM

## 2023-11-13 LAB — CBC WITH DIFFERENTIAL (CANCER CENTER ONLY)
Abs Immature Granulocytes: 0.02 K/uL (ref 0.00–0.07)
Basophils Absolute: 0.1 K/uL (ref 0.0–0.1)
Basophils Relative: 1 %
Eosinophils Absolute: 0.4 K/uL (ref 0.0–0.5)
Eosinophils Relative: 6 %
HCT: 34.1 % — ABNORMAL LOW (ref 39.0–52.0)
Hemoglobin: 11.4 g/dL — ABNORMAL LOW (ref 13.0–17.0)
Immature Granulocytes: 0 %
Lymphocytes Relative: 21 %
Lymphs Abs: 1.3 K/uL (ref 0.7–4.0)
MCH: 30.4 pg (ref 26.0–34.0)
MCHC: 33.4 g/dL (ref 30.0–36.0)
MCV: 90.9 fL (ref 80.0–100.0)
Monocytes Absolute: 0.7 K/uL (ref 0.1–1.0)
Monocytes Relative: 11 %
Neutro Abs: 3.8 K/uL (ref 1.7–7.7)
Neutrophils Relative %: 61 %
Platelet Count: 186 K/uL (ref 150–400)
RBC: 3.75 MIL/uL — ABNORMAL LOW (ref 4.22–5.81)
RDW: 14.2 % (ref 11.5–15.5)
WBC Count: 6.2 K/uL (ref 4.0–10.5)
nRBC: 0 % (ref 0.0–0.2)

## 2023-11-13 LAB — CMP (CANCER CENTER ONLY)
ALT: 24 U/L (ref 0–44)
AST: 27 U/L (ref 15–41)
Albumin: 3.5 g/dL (ref 3.5–5.0)
Alkaline Phosphatase: 131 U/L — ABNORMAL HIGH (ref 38–126)
Anion gap: 8 (ref 5–15)
BUN: 21 mg/dL (ref 8–23)
CO2: 23 mmol/L (ref 22–32)
Calcium: 8.9 mg/dL (ref 8.9–10.3)
Chloride: 104 mmol/L (ref 98–111)
Creatinine: 0.62 mg/dL (ref 0.61–1.24)
GFR, Estimated: >60 mL/min (ref >60)
Glucose, Bld: 133 mg/dL — ABNORMAL HIGH (ref 70–99)
Potassium: 4.1 mmol/L (ref 3.5–5.1)
Sodium: 135 mmol/L (ref 135–145)
Total Bilirubin: 0.4 mg/dL (ref 0.0–1.2)
Total Protein: 6.7 g/dL (ref 6.5–8.1)

## 2023-11-13 LAB — PSA: Prostatic Specific Antigen: 7.41 ng/mL — ABNORMAL HIGH (ref 0.00–4.00)

## 2023-11-13 MED ORDER — OXYCODONE HCL 10 MG PO TABS: 5.0000 mg | ORAL_TABLET | ORAL | 0 refills | Status: DC | PRN

## 2023-11-13 NOTE — Assessment & Plan Note (Addendum)
 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   NGS molecular studies.-[facility] NGS showed Tp53, TMB 5.3, MSI stable. Patient declined genetic testing.  Labs reviewed and discussed with patient.   PSA has been stable,today's level is pending PSMA showed disease progression in bone lesions.  Continue androgen deprivation therapy-Eligard  45 mg every 6 months- next due Dec 2025 on pluvicto  treatments.

## 2023-11-13 NOTE — Assessment & Plan Note (Signed)
 Recommend Oxycodone  5-10mg  Q6 PRN.

## 2023-11-13 NOTE — Assessment & Plan Note (Signed)
 Hb is slightly decrease. Due to treatments.  Monitor.

## 2023-11-13 NOTE — Progress Notes (Signed)
 Hematology/Oncology Progress note [phone] [phone]      CHIEF COMPLAINTS/REASON FOR VISIT:  Follow-up for prostate cancer treatment   ASSESSMENT & PLAN:   Cancer Staging  Prostate cancer [facility]) Staging form: Prostate, AJCC 8th Edition - Clinical stage from 06/23/2020: Stage IVB (cT2c, cNX, cM1b, PSA: 678, Grade Group: 5) - Signed by Babara Call, MD on 06/23/2020   Prostate cancer [facility]) Metastatic prostate cancer with bone metastasis,  castration resistant  s/p Docetaxel  x 6 cycles, followed by Darolutamide .   NGS molecular studies.-[facility] NGS showed Tp53, TMB 5.3, MSI stable. Patient declined genetic testing.  Labs reviewed and discussed with patient.   PSA has been stable,today's level is pending PSMA showed disease progression in bone lesions.  Continue androgen deprivation therapy-Eligard  45 mg every 6 months- next due Dec 2025 on pluvicto  treatments.   Neoplasm related pain Recommend Oxycodone  5-10mg  Q6 PRN.   Tobacco use Smoke cessation discussed Recommend him to establish with PCP CT chest lung cancer screening is up-to-date.  Vasomotor flushing Hot flash due to ADT, continue Effexor  37.5mg  daily.  Normocytic anemia Hb is slightly decrease. Due to treatments.  Monitor.   Orders Placed This Encounter  Procedures   CBC with Differential (Cancer Center Only)    Standing Status:   Future    Expected Date:   01/01/2024    Expiration Date:   03/31/2024   CMP (Cancer Center only)    Standing Status:   Future    Expected Date:   01/01/2024    Expiration Date:   03/31/2024   PSA    Standing Status:   Future    Expected Date:   01/01/2024    Expiration Date:   03/31/2024   Follow up per LOS  All questions were answered. The patient knows to call the clinic with any problems, questions or concerns.  Call Babara, MD, PhD [facility] Hematology Oncology 11/13/2023    HISTORY OF PRESENTING ILLNESS:  05/27/2020 patient presented to emergency  room for evaluation of generalized weakness and and body pain.  Prior to the presentations, patient also had history of tooth abscess and was prescribed antibiotics. Reports acute on chronic right-sided lumbar pain without any prior trauma history.  Profound weakness.  Decreased appetite and loss of weight about 10 pounds during the past few months.  Also complained intermittent right upper quadrant discomfort  326 04/10/2020 CT abdomen pelvis showed a diffusely patchy sclerotic appearance of the osseous structures suspicious for osseous metastatic disease.  Prostate gland is heterogeneous in appearance.  Correlate with PSA.  Patient had a mild superior endplate compression deformity of L4 with less than 10% vertebral body height loss.  H indeterminate.  Fusiform infrarenal abdominal aortic aneurysm measuring up to 3.5 cm.  Follow-up in 2 years. Suspicious findings for early acute uncomplicated appendicitis.  Patient denies any right lower quadrant pain.  In the emergency room, he has a negative Murphy sign.  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  Highest Gleason score 5+4 Postprocedure, patient has had hematuria and hematochezia, bilateral lower extremity pain, AKI orthostatic hypovolemia, sepsis and was admitted and treated.  Patient was discharged on a course of antibiotics.  06/18/2020 MRI thoracic, lumbar, sacrum is compatible with diffuse osseous metastasis in the visualized thoracic lumbar spine, sacrum and bilateral iliac bones.  No definitive dural based tumor is identified.  No compression fracture.  Spine spondylosis  06/19/2020, patient received loading dose of  Firmagon  240 mg x 1. #06/22/2020, bone scan showed widespread osseous metastatic disease involving the appendicular and axial skeleton #April 2022. COVID infection. #07/10/2020, Dr. Marea placed Mediport #07/17/2020-10/30/2020 6 cycles of docetaxel .   #03/06/2021,  started on darolutamide   [ARASENS trial, the combination of darolutamide  plus docetaxel  is an approved option for treatment of metastatic CSPC- Improved OS, time to develop castration resistant disease].   #INTERVAL HISTORY Cody Vaughan is a 63 y.o. male who has above history reviewed by me today presents for follow up visit for management of metastatic prostate cancer, 08/26/2023 started on Pleuvicto treatments. Plan Q6 weeks x 6  Chronic back pain after work. + hot flash improved after taking Effexor . He takes oxycodone  3-4 time per day with some relief of symptoms. He takes mobic  daily.   Review of Systems  Constitutional:  Positive for fatigue. Negative for appetite change, chills, fever and unexpected weight change.       Patient walks independently  HENT:   Negative for hearing loss and voice change.   Eyes:  Negative for eye problems and icterus.  Respiratory:  Negative for chest tightness, cough and shortness of breath.   Cardiovascular:  Negative for chest pain and leg swelling.  Gastrointestinal:  Negative for abdominal distention and abdominal pain.  Endocrine: Positive for hot flashes.  Genitourinary:  Negative for difficulty urinating, dysuria and frequency.   Musculoskeletal:  Positive for back pain. Negative for arthralgias.  Skin:  Negative for itching and rash.  Neurological:  Negative for light-headedness and numbness.  Hematological:  Negative for adenopathy. Does not bruise/bleed easily.  Psychiatric/Behavioral:  Negative for confusion.     MEDICAL HISTORY:  Past Medical History:  Diagnosis Date   Prostate cancer (HCC)    2022    SURGICAL HISTORY: Past Surgical History:  Procedure Laterality Date   PORTA CATH INSERTION N/A 07/10/2020   Procedure: PORTA CATH INSERTION;  Surgeon: Marea Selinda GORMAN, MD;  Location: [facility] INVASIVE CV LAB;  Service: Cardiovascular;  Laterality: N/A;   PROSTATE BIOPSY N/A 06/16/2020   Procedure: PROSTATE BIOPSY;  Surgeon: Francisca Redell BROCKS,  MD;  Location: [facility] ORS;  Service: Urology;  Laterality: N/A;   SHOULDER SURGERY     1985   TRANSRECTAL ULTRASOUND N/A 06/16/2020   Procedure: TRANSRECTAL ULTRASOUND;  Surgeon: Francisca Redell BROCKS, MD;  Location: [facility] ORS;  Service: Urology;  Laterality: N/A;    SOCIAL HISTORY: Social History   Socioeconomic History   Marital status: Married    Spouse name: Not on file   Number of children: Not on file   Years of education: Not on file   Highest education level: Not on file  Occupational History   Occupation: land scaper    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, 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  Tobacco Use   Smoking status: Former    Current packs/day: 0.00    Average packs/day: 1 pack/day for 45.0 years (45.0 ttl pk-yrs)    Types: Cigarettes    Start date: 07/09/1977    Quit date: 07/10/2022    Years since quitting: 1.3   Smokeless tobacco: Never  Substance and Sexual Activity   Alcohol use: Not Currently   Drug use: Never   Sexual activity: Not Currently  Other Topics Concern   Not on file  Social History Narrative   Not on file   Social Drivers of Health   Financial Resource Strain: Low Risk  (10/13/2023)   Overall Financial Resource Strain (CARDIA)    Difficulty of Paying Living Expenses: Not  hard at all  Food Insecurity: No Food Insecurity (10/13/2023)   Hunger Vital Sign    Worried About Running Out of Food in the Last Year: Never true    Ran Out of Food in the Last Year: Never true  Transportation Needs: No Transportation Needs (10/13/2023)   PRAPARE - Administrator, Civil Service (Medical): No    Lack of Transportation (Non-Medical): No  Physical Activity: Not on file  Stress: Not on file  Social Connections: Not on file  Intimate Partner Violence: Unknown (10/13/2023)   Humiliation, Afraid, Rape, and Kick questionnaire    Fear of Current or Ex-Partner: No    Emotionally Abused: No    Physically Abused: No    Sexually Abused: Not on file    FAMILY HISTORY: Family History   Problem Relation Age of Onset   COPD Mother    Heart attack Father     ALLERGIES:  has no known allergies.  MEDICATIONS:  Current Outpatient Medications  Medication Sig Dispense Refill   acetaminophen  (TYLENOL ) 325 MG tablet Take 650 mg by mouth every 6 (six) hours as needed for moderate pain or mild pain.     Calcium  600-400 MG-UNIT CHEW Chew 2 tablets by mouth daily. 60 tablet 3   Lutetium Lu 177 Vipivotide Tet (PLUVICTO  IV) Inject into the vein.     meloxicam  (MOBIC ) 7.5 MG tablet Take 1 tablet (7.5 mg total) by mouth daily. 30 tablet 2   Multiple Vitamins-Minerals (MULTIVITAMIN WITH MINERALS) tablet Take 1 tablet by mouth daily.     ondansetron  (ZOFRAN ) 4 MG tablet Take 1 tablet (4 mg total) by mouth every 8 (eight) hours as needed for nausea or vomiting. 20 tablet 0   venlafaxine  (EFFEXOR ) 37.5 MG tablet Take 1 tablet (37.5 mg total) by mouth daily. 90 tablet 1   nicotine  (NICODERM CQ  - DOSED IN MG/24 HOURS) 14 mg/24hr patch PLACE 1 PATCH ONTO THE SKIN DAILY (Patient not taking: Reported on 11/13/2023) 30 patch 0   Oxycodone  HCl 10 MG TABS Take 0.5-1 tablets (5-10 mg total) by mouth every 4 (four) hours as needed (pain). 60 tablet 0   No current facility-administered medications for this visit.   Facility-Administered Medications Ordered in Other Visits  Medication Dose Route Frequency Provider Last Rate Last Admin   heparin  lock flush 100 UNIT/ML injection              PHYSICAL EXAMINATION: ECOG PERFORMANCE STATUS: 0 - Asymptomatic Vitals:   11/13/23 0953  BP: 139/79  Pulse: 61  Resp: 20  Temp: 98.6 F (37 C)  SpO2: 100%   Filed Weights   11/13/23 0953  Weight: 166 lb 11.2 oz (75.6 kg)    Physical Exam Constitutional:      General: He is not in acute distress. HENT:     Head: Normocephalic and atraumatic.  Eyes:     General: No scleral icterus. Cardiovascular:     Rate and Rhythm: Normal rate and regular rhythm.     Heart sounds: Normal heart sounds.   Pulmonary:     Effort: Pulmonary effort is normal. No respiratory distress.     Breath sounds: No wheezing.  Abdominal:     General: Bowel sounds are normal. There is no distension.     Palpations: Abdomen is soft.  Musculoskeletal:        General: No deformity. Normal range of motion.     Cervical back: Normal range of motion and neck supple.  Skin:  General: Skin is warm and dry.     Findings: No erythema or rash.  Neurological:     Mental Status: He is alert and oriented to person, place, and time. Mental status is at baseline.     Cranial Nerves: No cranial nerve deficit.     Coordination: Coordination normal.  Psychiatric:        Mood and Affect: Mood normal.     LABORATORY DATA:  I have reviewed the data as listed    Latest Ref Rng & Units 11/13/2023    9:43 AM 10/13/2023    8:10 AM 09/11/2023    7:59 AM  CBC  WBC 4.0 - 10.5 K/uL 6.2  6.8  6.1   Hemoglobin 13.0 - 17.0 g/dL 88.5  87.6  88.3   Hematocrit 39.0 - 52.0 % 34.1  36.2  34.0   Platelets 150 - 400 K/uL 186  216  289       Latest Ref Rng & Units 11/13/2023    9:43 AM 09/11/2023    7:59 AM 08/26/2023   11:53 AM  CMP  Glucose 70 - 99 mg/dL 866  883  92   BUN 8 - 23 mg/dL 21  14  9    Creatinine 0.61 - 1.24 mg/dL 9.37  9.20  9.30   Sodium 135 - 145 mmol/L 135  135  138   Potassium 3.5 - 5.1 mmol/L 4.1  3.7  3.5   Chloride 98 - 111 mmol/L 104  105  106   CO2 22 - 32 mmol/L 23  23  25    Calcium  8.9 - 10.3 mg/dL 8.9  8.8  8.3   Total Protein 6.5 - 8.1 g/dL 6.7  7.0    Total Bilirubin 0.0 - 1.2 mg/dL 0.4  0.5    Alkaline Phos 38 - 126 U/L 131  129    AST 15 - 41 U/L 27  28    ALT 0 - 44 U/L 24  25       RADIOGRAPHIC STUDIES: I have personally reviewed the radiological images as listed and agreed with the findings in the report. NM PLUVICTO  ADMINISTRATION Result Date: 10/17/2023 CLINICAL DATA:  63 year old male with castrate resistant metastatic prostate carcinoma EXAM: NUCLEAR MEDICINE PLUVICTO  INJECTION  TECHNIQUE: Infusion: The nuclear medicine technologist and I personally verified the dose activity to be delivered as specified in the written directive, and verified the patient identification via 2 separate methods. Initial flush of the intravenous catheter was performed was sterile saline. The dose syringe was connected to the catheter and the Lu-177 Pluvicto  administered over a 1 to 10 min infusion. Single 10 cc lushes with normal saline follow the dose. No complications were noted. The entire IV tubing, venocatheter, stopcock and syringes was removed in total, placed in a disposal bag and sent for assay of the residual activity, which will be reported at a later time in our EMR by the physics staff. Pressure was applied to the venipuncture site, and a compression bandage placed. Patient monitored for 1 hour following infusion. Radiation Safety personnel were present to perform the discharge survey, as detailed on their documentation. After a short period of observation, the patient had his IV removed. RADIOPHARMACEUTICALS:  Two hundred seven microcuries Lu-177 PLUVICTO  FINDINGS: Current Infusion: 2 Planned Infusions: 6 Patient presented to nuclear medicine for treatment. Patient reports no interval adverse effects from initial therapy. No mild suppression renal toxicity. PSA had initial mild elevation and subsequent decrease most consistent with a mild flare  response. PSA currently equal 7. The patient's most recent blood counts were reviewed and remains a good candidate to proceed with Lu-177 Pluvicto . The patient was situated in an infusion suite with a contact barrier placed under the arm. Intravenous access was established, using sterile technique, and a normal saline infusion from a syringe was started. Micro-dosimetry: The prescribed radiation activity was assayed and confirmed to be within specified tolerance. IMPRESSION: Current Infusion: 2 Planned Infusions: 6 The patient tolerated the infusion well.  The patient will return in one month for ongoing care. Electronically Signed   By: Jackquline Boxer M.D.   On: 10/17/2023 13:44   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 Positive PSMA PET scan EXAM: NUCLEAR MEDICINE PLUVICTO  INJECTION TECHNIQUE: Infusion: The nuclear medicine technologist and I personally verified the dose activity to be delivered as specified in the written directive, and verified the patient identification via 2 separate methods. Initial flush of the intravenous catheter was performed was sterile saline. The dose syringe was connected to the catheter and the Lu-177 Pluvicto  administered over a 1 to 10 min infusion. Single 10 cc lushes with normal saline follow the dose. No complications were noted. The entire IV tubing, venocatheter, stopcock and syringes was removed in total, placed in a disposal bag and sent for assay of the residual activity, which will be reported at a later time in our EMR by the physics staff. Pressure was applied to the venipuncture site, and a compression bandage placed. Patient monitored for 1 hour following infusion. Radiation Safety personnel were present to perform the discharge survey, as detailed on their documentation. After a short period of observation, the patient had his IV removed. RADIOPHARMACEUTICALS:  One hundred ninety-nine microcuries Lu-177 PLUVICTO  FINDINGS: Current Infusion: 1 Planned Infusions: 6 Patient presented to nuclear medicine for treatment. The patient's most recent blood counts were reviewed and remains a good candidate to proceed with Lu-177 Pluvicto . Mild anemia stable with hemoglobin 11.4.  Normal renal function. PSA equal 7.49 increased from 4.42 Patient reports persistent back pain. The patient was situated in an infusion suite with a contact barrier placed under the arm. Intravenous access was established, using sterile technique,  and a normal saline infusion from a syringe was started. Micro-dosimetry: The prescribed radiation activity was assayed and confirmed to be within specified tolerance. IMPRESSION: Current Infusion: 1 Planned Infusions: 6 The patient tolerated the infusion well. The patient will return in 6 weeks for ongoing care. Electronically Signed   By: Jackquline Boxer M.D.   On: 08/26/2023 14:40

## 2023-11-13 NOTE — Assessment & Plan Note (Signed)
Hot flash due to ADT, continue Effexor 37.5mg daily. 

## 2023-11-13 NOTE — Progress Notes (Signed)
 Palliative Medicine [facility] at [facility] [phone] Fax:([phone]) [phone]   Name: Cody Vaughan Date: 11/13/2023 MRN: [id number]  DOB: [birth year]  Patient Care Team: Patient, No Pcp Per as PCP - General (General Practice) Babara Call, MD as Consulting Physician (Oncology)    REASON FOR CONSULTATION: Cody Vaughan is a 63 y.o. 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  Patient has had ongoing pain.  He is referred to palliative care to address goals and manage ongoing symptoms.  SOCIAL HISTORY:     reports that he quit smoking about 16 months ago. His smoking use included cigarettes. He started smoking about 46 years ago. He has a 45 pack-year smoking history. He has never used smokeless tobacco. He reports that he does not currently use alcohol. He reports that he does not use drugs.  Patient is married and lives at home with his wife.  He had 2 biological sons who are both deceased.  Patient has stepchildren.  He still works as a Administrator.  ADVANCE DIRECTIVES:    CODE STATUS:   PAST MEDICAL HISTORY: Past Medical History:  Diagnosis Date   Prostate cancer (HCC)    2022    PAST SURGICAL HISTORY:  Past Surgical History:  Procedure Laterality Date   PORTA CATH INSERTION N/A 07/10/2020   Procedure: PORTA CATH INSERTION;  Surgeon: Marea Selinda GORMAN, MD;  Location: [facility] INVASIVE CV LAB;  Service: Cardiovascular;  Laterality: N/A;   PROSTATE BIOPSY N/A 06/16/2020   Procedure: PROSTATE BIOPSY;  Surgeon: Francisca Redell BROCKS, MD;  Location: [facility] ORS;  Service: Urology;  Laterality: N/A;   SHOULDER SURGERY     1985   TRANSRECTAL ULTRASOUND N/A 06/16/2020   Procedure: TRANSRECTAL ULTRASOUND;  Surgeon: Francisca Redell BROCKS, MD;  Location: [facility] ORS;  Service: Urology;  Laterality: N/A;    HEMATOLOGY/ONCOLOGY HISTORY:  Oncology History  Prostate cancer (HCC)  06/14/2020 Initial Diagnosis   Prostate  cancer  -05/27/2020 patient presented to emergency room for evaluation of generalized weakness and and body pain.  Prior to the presentations, patient also had history of tooth abscess and was prescribed antibiotics. Reports acute on chronic right-sided lumbar pain without any prior trauma history.  Profound weakness.  Decreased appetite and loss of weight about 10 pounds during the past few months.  Also complained intermittent right upper quadrant discomfort  -06/16/2020, patient is status post prostate biopsy-8 out of 12 cores pathology  positive for Anicar  adenocarcinoma.  Highest Gleason score 5+4 -06/20/2019, patient received loading dose of Firmagon  240 mg   06/18/2020 Imaging   MRI thoracic, lumbar, sacrum is compatible with diffuse osseous metastasis in the visualized thoracic lumbar spine, sacrum and bilateral iliac bones.  No definitive dural based tumor is identified.  No compression fracture.  Spine spondylosis   06/19/2020 Imaging   bone scan showed widespread osseous metastatic disease involving the appendicular and axial skeleton   06/23/2020 Cancer Staging   Staging form: Prostate, AJCC 8th Edition - Clinical stage from 06/23/2020: Stage IVB (cT2c, cNX, cM1b, PSA: 678, Grade Group: 5) - Signed by Babara Call, MD on 06/23/2020 Prostate specific antigen (PSA) range: 20 or greater Histologic grading system: 5 grade system   07/10/2020 Procedure   Dr. Marea placed Mediport   07/17/2020 -  Chemotherapy   DOCETAXEL  Q21D x 6       08/07/2020 - 08/18/2020 Radiation Therapy   Pelvis Radiation   03/06/2021 -  Chemotherapy   started on darolutamide   [ARASENS trial, the combination of darolutamide  plus docetaxel  is an approved option for treatment of metastatic CSPC- Improved OS, time to develop castration resistant disease].      ALLERGIES:  has no known allergies.  MEDICATIONS:  Current Outpatient Medications  Medication Sig Dispense Refill   acetaminophen  (TYLENOL ) 325 MG tablet Take 650  mg by mouth every 6 (six) hours as needed for moderate pain or mild pain.     Calcium  600-400 MG-UNIT CHEW Chew 2 tablets by mouth daily. 60 tablet 3   Lutetium Lu 177 Vipivotide Tet (PLUVICTO  IV) Inject into the vein.     meloxicam  (MOBIC ) 7.5 MG tablet Take 1 tablet (7.5 mg total) by mouth daily. 30 tablet 2   Multiple Vitamins-Minerals (MULTIVITAMIN WITH MINERALS) tablet Take 1 tablet by mouth daily.     nicotine  (NICODERM CQ  - DOSED IN MG/24 HOURS) 14 mg/24hr patch PLACE 1 PATCH ONTO THE SKIN DAILY (Patient not taking: Reported on 11/13/2023) 30 patch 0   ondansetron  (ZOFRAN ) 4 MG tablet Take 1 tablet (4 mg total) by mouth every 8 (eight) hours as needed for nausea or vomiting. 20 tablet 0   Oxycodone  HCl 10 MG TABS Take 0.5-1 tablets (5-10 mg total) by mouth every 4 (four) hours as needed (pain). 60 tablet 0   venlafaxine  (EFFEXOR ) 37.5 MG tablet Take 1 tablet (37.5 mg total) by mouth daily. 90 tablet 1   No current facility-administered medications for this visit.   Facility-Administered Medications Ordered in Other Visits  Medication Dose Route Frequency Provider Last Rate Last Admin   heparin  lock flush 100 UNIT/ML injection             VITAL SIGNS: There were no vitals taken for this visit. There were no vitals filed for this visit.   Estimated body mass index is 23.25 kg/m as calculated from the following:   Height as of 11/27/21: 5' 11 (1.803 m).   Weight as of an earlier encounter on 11/13/23: 166 lb 11.2 oz (75.6 kg).  LABS: CBC:    Component Value Date/Time   WBC 6.2 11/13/2023 0943   WBC 6.2 08/26/2023 1153   HGB 11.4 (L) 11/13/2023 0943   HGB 12.6 (L) 09/07/2012 0431   HCT 34.1 (L) 11/13/2023 0943   HCT 36.5 (L) 09/07/2012 0431   PLT 186 11/13/2023 0943   PLT 288 09/07/2012 0431   MCV 90.9 11/13/2023 0943   MCV 89 09/07/2012 0431   NEUTROABS 3.8 11/13/2023 0943   NEUTROABS 6.2 09/07/2012 0431   LYMPHSABS 1.3 11/13/2023 0943   LYMPHSABS 1.8 09/07/2012 0431    MONOABS 0.7 11/13/2023 0943   MONOABS 1.6 (H) 09/07/2012 0431   EOSABS 0.4 11/13/2023 0943   EOSABS 0.3 09/07/2012 0431   BASOSABS 0.1 11/13/2023 0943   BASOSABS 0.1 09/07/2012 0431   Comprehensive Metabolic Panel:    Component Value Date/Time   NA 135 11/13/2023 0943   NA 142 09/06/2012 0429   K 4.1 11/13/2023 0943   K 3.5 09/06/2012 0429   CL 104 11/13/2023 0943   CL 106 09/06/2012 0429   CO2 23 11/13/2023 0943   CO2 28 09/06/2012 0429   BUN 21 11/13/2023 0943   BUN 9 09/06/2012 0429   CREATININE 0.62 11/13/2023 0943   CREATININE 0.97 09/06/2012 0429   GLUCOSE 133 (H) 11/13/2023 0943   GLUCOSE 105 (H) 09/06/2012 0429   CALCIUM  8.9 11/13/2023 0943   CALCIUM  8.5 09/06/2012 0429   AST 27  11/13/2023 0943   ALT 24 11/13/2023 0943   ALT 43 09/02/2012 0658   ALKPHOS 131 (H) 11/13/2023 0943   ALKPHOS 71 09/02/2012 0658   BILITOT 0.4 11/13/2023 0943   PROT 6.7 11/13/2023 0943   PROT 5.9 (L) 09/02/2012 0658   ALBUMIN 3.5 11/13/2023 0943   ALBUMIN 2.6 (L) 09/02/2012 0658    RADIOGRAPHIC STUDIES: NM PLUVICTO  ADMINISTRATION Result Date: 10/17/2023 CLINICAL DATA:  63 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 Initial flush of the intravenous catheter was performed was sterile saline. The dose syringe was connected to the catheter and the Lu-177 Pluvicto  administered over a 1 to 10 min infusion. Single 10 cc lushes with normal saline follow the dose. No complications were noted. The entire IV tubing, venocatheter, stopcock and syringes was removed in total, placed in a disposal bag and sent for assay of the residual activity, which will be reported at a later time in our EMR by the physics staff. Pressure was  applied to the venipuncture site, and a compression bandage placed. Patient monitored for 1 hour following infusion. Radiation Safety personnel were present to perform the discharge survey, as detailed on their documentation. After a short period of observation, the patient had his IV removed. RADIOPHARMACEUTICALS:  Two hundred seven microcuries Lu-177 PLUVICTO  FINDINGS: Current Infusion: 2 Planned Infusions: 6 Patient presented to nuclear medicine for treatment. Patient reports no interval adverse effects from initial therapy. No mild suppression renal toxicity. PSA had initial mild elevation and subsequent decrease most consistent with a mild flare response. PSA currently equal 7. The patient's most recent blood counts were reviewed and remains a good candidate to proceed with Lu-177 Pluvicto . The patient was situated in an infusion suite with a contact barrier placed under the arm. Intravenous access was established, using sterile technique, and a normal saline infusion from a syringe was started. Micro-dosimetry: The prescribed radiation activity was assayed and confirmed to be within specified tolerance. IMPRESSION: Current Infusion: 2 Planned Infusions: 6 The patient tolerated the infusion well. The patient will return in one month for ongoing care. Electronically Signed   By: Jackquline Boxer M.D.   On: 10/17/2023 13:44    PERFORMANCE STATUS (ECOG) : 1 - Symptomatic but completely ambulatory  Review of Systems Unless otherwise noted, a complete review of systems is negative.  Physical Exam General: NAD Pulmonary: Unlabored Abdomen: soft, nontender, + bowel sounds GU: no suprapubic tenderness Extremities: no edema, no joint deformities Skin: no rashes Neurological: Weakness but otherwise nonfocal  IMPRESSION: Follow-up visit.  Patient reports he is doing about the same.  Denies significant changes or concerns.  He reports stable pain.  He continues to use oxycodone  but says that he is not  requiring it every 4-6 hours.  He does request refill today.  He says that the meloxicam  also helps but has been instructed to take it as needed.  Denies any adverse effects from pain medications.  Patient does endorse poor oral intake.  We spent most of the visit today discussing strategies to increase his protein and calorie intake.  He is utilizing oral nutritional supplements once daily.  I suggested that he increase dose to twice daily.  He is followed by nutrition.   PLAN: - Continue current scope of treatment - Continue oxycodone  - Continue  meloxicam  7.5 mg as needed - Daily bowel regimen - ACP/MOST form previously reviewed -Follow-up telephone visit 1 to 2 months   Patient expressed understanding and was in agreement with this plan. He also understands that He can call the clinic at any time with any questions, concerns, or complaints.     Time Total: 15 minutes  Visit consisted of counseling and education dealing with the complex and emotionally intense issues of symptom management and palliative care in the setting of serious and potentially life-threatening illness.Greater than 50%  of this time was spent counseling and coordinating care related to the above assessment and plan.  Signed by: Fonda Mower, PhD, NP-C

## 2023-11-13 NOTE — Assessment & Plan Note (Signed)
Smoke cessation discussed Recommend him to establish with PCP CT chest lung cancer screening is up-to-date.

## 2023-11-26 NOTE — Written Directive (Cosign Needed)
  PLUVICTO   THERAPY   RADIOPHARMACEUTICAL: Lutetium 177 vipivotide tetraxetan (Pluvicto )     PRESCRIBED DOSE FOR ADMINISTRATION:  200 mCi   ROUTE OFADMINISTRATION:  IV   DIAGNOSIS:  prostate cancer, Prostate cancer (HCC)   REFERRING PHYSICIAN: Babara Call, MD   TREATMENT #: 3   ADDITIONAL PHYSICIAN COMMENTS/NOTES:   AUTHORIZED USER SIGNATURE & TIME STAMP: Norleen GORMAN Boxer, MD   11/27/23    8:10 AM

## 2023-11-27 ENCOUNTER — Encounter: Payer: Self-pay | Admitting: Oncology

## 2023-11-27 ENCOUNTER — Encounter (HOSPITAL_COMMUNITY)
Admission: RE | Admit: 2023-11-27 | Discharge: 2023-11-27 | Disposition: A | Discharge status: Home / Self Care | Source: Ambulatory Visit | Attending: Oncology | Admitting: Oncology

## 2023-11-27 DIAGNOSIS — C61 Malignant neoplasm of prostate: Secondary | ICD-10-CM | POA: Diagnosis present

## 2023-11-27 MED ORDER — HEPARIN SOD (PORK) LOCK FLUSH 100 UNIT/ML IV SOLN
500.0000 [IU] | Freq: Once | INTRAVENOUS | Status: DC
  Filled 2023-11-27: qty 5

## 2023-11-27 MED ORDER — SODIUM CHLORIDE 0.9 % IV BOLUS: 1000.0000 mL | Freq: Once | INTRAVENOUS | Status: DC

## 2023-11-27 MED ORDER — LUTETIUM LU 177 VIPIVOTIDE TET 1000 MBQ/ML IV SOLN
201.8800 | Freq: Once | INTRAVENOUS | Status: AC
  Administered 2023-11-27: 201.88 via INTRAVENOUS

## 2023-11-27 MED ORDER — SODIUM CHLORIDE 0.9 % IV BOLUS
1000.0000 mL | Freq: Once | INTRAVENOUS | Status: AC
  Administered 2023-11-27: 1000 mL via INTRAVENOUS

## 2023-11-27 NOTE — Progress Notes (Signed)
 CLINICAL DATA: [Castrate resistant prostate carcinoma.  Skeletal metastasis.]  EXAM: NUCLEAR MEDICINE PLUVICTO  INJECTION  TECHNIQUE: Infusion: The nuclear medicine technologist and I personally verified the dose activity to be delivered as specified in the written directive, and verified the patient identification via 2 separate methods.  Initial flush of the intravenous catheter was performed was sterile saline. The dose syringe was connected to the catheter and the Lu-177 Pluvicto  administered over a 1 to 10 min infusion. Single 10 cc  lushes with normal saline follow the dose. No complications were noted. The entire IV tubing, venocatheter, stopcock and syringes was removed in total, placed in a disposal bag and sent for assay of the residual activity, which will be reported at a later time in our EMR by the physics staff. Pressure was applied to the venipuncture site, and a compression bandage placed. Patient monitored for 1 hour following infusion.    Radiation Safety personnel were present to perform the discharge survey, as detailed on their documentation. After a short period of observation, the patient had his IV removed.  RADIOPHARMACEUTICALS: [Two hundred two] microcuries Lu-177 PLUVICTO   FINDINGS: Current Infusion: [3]  Planned Infusions: 6    Patient presented to nuclear medicine for treatment. The patient's most recent blood counts were reviewed and remains a good candidate to proceed with Lu-177 Pluvicto .     PSA stable at 7.4.  No mild suppression renal toxicity.     Patient reports 1 day of flu-like symptoms following last therapy.     Intermittent back pain.     The patient was situated in an infusion suite with a contact barrier placed under the arm. Intravenous access was established, using sterile technique, and a normal saline infusion from a syringe was started.     Micro-dosimetry: The prescribed radiation activity was assayed and confirmed to be  within specified tolerance.  IMPRESSION: Current Infusion: [3]  Planned Infusions: 6    [The patient tolerated the infusion well. The patient will return in 6 weeks for ongoing care.]

## 2023-12-08 ENCOUNTER — Encounter: Payer: Self-pay | Admitting: Emergency Medicine

## 2023-12-08 ENCOUNTER — Other Ambulatory Visit: Payer: Self-pay

## 2023-12-08 ENCOUNTER — Encounter: Payer: Self-pay | Admitting: Oncology

## 2023-12-08 ENCOUNTER — Other Ambulatory Visit: Payer: Self-pay | Admitting: Oncology

## 2023-12-08 ENCOUNTER — Ambulatory Visit
Admission: EM | Admit: 2023-12-08 | Discharge: 2023-12-08 | Disposition: A | Discharge status: Home / Self Care | Attending: Emergency Medicine | Admitting: Emergency Medicine

## 2023-12-08 DIAGNOSIS — R0789 Other chest pain: Secondary | ICD-10-CM

## 2023-12-08 DIAGNOSIS — J069 Acute upper respiratory infection, unspecified: Secondary | ICD-10-CM

## 2023-12-08 MED ORDER — PREDNISONE 10 MG (21) PO TBPK: ORAL_TABLET | Freq: Every day | ORAL | 0 refills | Status: DC

## 2023-12-08 MED ORDER — AMOXICILLIN-POT CLAVULANATE 875-125 MG PO TABS: 1.0000 | ORAL_TABLET | Freq: Two times a day (BID) | ORAL | 0 refills | Status: DC

## 2023-12-08 MED ORDER — KETOROLAC TROMETHAMINE 30 MG/ML IJ SOLN
30.0000 mg | Freq: Once | INTRAMUSCULAR | Status: AC
  Administered 2023-12-08: 30 mg via INTRAMUSCULAR

## 2023-12-08 MED ORDER — BENZONATATE 100 MG PO CAPS: 100.0000 mg | ORAL_CAPSULE | Freq: Three times a day (TID) | ORAL | 0 refills | Status: AC

## 2023-12-08 MED ORDER — PREDNISONE 10 MG (21) PO TBPK: ORAL_TABLET | Freq: Every day | ORAL | 0 refills | Status: AC

## 2023-12-08 NOTE — ED Triage Notes (Signed)
 Patient complains of a dry cough and left lateral Chest pain x 2 weeks. Patient has not taken any pain medication today. Rates pain a 10/10.

## 2023-12-08 NOTE — Discharge Instructions (Signed)
 On exam your lungs are clear and you are getting enough air without assistance therefore we have held off on a chest x-ray  On exam able to press on the left side of your body and replicate your pain which is reassuring as this is most likely muscular exacerbated by the coughing  Begin Augmentin twice daily for 7 days as your symptoms are present for 2 weeks  You have been given an injection of Toradol to help reduce pain and ideally will start to see some relief within an hour, starting tomorrow take oral prednisone as directed, avoid use of ibuprofen  and meloxicam  during treatment but may use Tylenol  and oxycodone  as needed  You may use Tessalon pill at as needed for cough     You can take Tylenol  as needed for fever reduction and pain relief.   For cough: honey 1/2 to 1 teaspoon (you can dilute the honey in water or another fluid).  You can also use guaifenesin and dextromethorphan for cough. You can use a humidifier for chest congestion and cough.  If you don't have a humidifier, you can sit in the bathroom with the hot shower running.      For sore throat: try warm salt water gargles, cepacol lozenges, throat spray, warm tea or water with lemon/honey, popsicles or ice, or OTC cold relief medicine for throat discomfort.   For congestion: take a daily anti-histamine like Zyrtec, Claritin, and a oral decongestant, such as pseudoephedrine.  You can also use Flonase 1-2 sprays in each nostril daily.   It is important to stay hydrated: drink plenty of fluids (water, gatorade/powerade/pedialyte, juices, or teas) to keep your throat moisturized and help further relieve irritation/discomfort.

## 2023-12-09 ENCOUNTER — Telehealth: Payer: Self-pay | Admitting: *Deleted

## 2023-12-09 NOTE — ED Provider Notes (Signed)
 Cody Vaughan    CSN: [id number] Arrival date & time: 12/08/23  1646      History   Chief Complaint Chief Complaint  Patient presents with   Chest Pain   Cough    HPI Cody Vaughan is a 63 y.o. male.   Patient presents for evaluation of mild chest congestion, productive cough, shortness of breath with exertion and left lateral side pain beginning 2 weeks ago.  Back pain progressively worsening, can be felt with all movement, causing pain with deep breathing.  Symptoms exacerbated by changing in position.  Denies respiratory history, former smoker.  Denies fever or wheezing.  No known sick contact prior.  Has attempted use of heating pad, muscle rub and oxycodone .  Past Medical History:  Diagnosis Date   Prostate cancer (HCC)    2022    Patient Active Problem List   Diagnosis Date Noted   Normocytic anemia 11/13/2023   Vasomotor flushing 02/14/2022   Tobacco use 11/15/2021   Neoplasm related pain 08/28/2020   Port-A-Cath in place 08/28/2020   Encounter for antineoplastic chemotherapy 07/24/2020   Loss of weight 06/23/2020   Post-operative pain    Androgen deprivation therapy    Palliative care encounter    Pericardial effusion 06/18/2020   Fall 06/17/2020   Hematuria 06/17/2020   Postural dizziness with presyncope 06/16/2020   Prostate cancer (HCC) 06/14/2020   Abnormal CT scan 06/07/2020   Elevated alkaline phosphatase level 06/07/2020   Goals of care, counseling/discussion 06/07/2020   Bone lesion 06/07/2020   Epididymo-orchitis with abscess 09/16/2012   Febrile urinary tract infection 09/16/2012   Scrotal wall abscess 09/16/2012    Past Surgical History:  Procedure Laterality Date   PORTA CATH INSERTION N/A 07/10/2020   Procedure: PORTA CATH INSERTION;  Surgeon: Marea Selinda GORMAN, MD;  Location: [facility];  Service: Cardiovascular;  Laterality: N/A;   PROSTATE BIOPSY N/A 06/16/2020   Procedure: PROSTATE BIOPSY;  Surgeon: Francisca Redell BROCKS, MD;   Location: [facility];  Service: Urology;  Laterality: N/A;   SHOULDER SURGERY     1985   TRANSRECTAL ULTRASOUND N/A 06/16/2020   Procedure: TRANSRECTAL ULTRASOUND;  Surgeon: Francisca Redell BROCKS, MD;  Location: [facility];  Service: Urology;  Laterality: N/A;       Home Medications    Prior to Admission medications   Medication Sig Start Date End Date Taking? Authorizing Provider  amoxicillin-clavulanate (AUGMENTIN) 875-125 MG tablet Take 1 tablet by mouth every 12 (twelve) hours. 12/08/23  Yes Lakeyn Dokken R, NP  benzonatate (TESSALON) 100 MG capsule Take 1 capsule (100 mg total) by mouth every 8 (eight) hours. 12/08/23  Yes Pheonix Clinkscale R, NP  acetaminophen  (TYLENOL ) 325 MG tablet Take 650 mg by mouth every 6 (six) hours as needed for moderate pain or mild pain.    [provider]  Calcium  600-400 MG-UNIT CHEW Chew 2 tablets by mouth daily. 08/28/20   Babara Call, MD  Lutetium Lu 177 Vipivotide Tet (PLUVICTO  IV) Inject into the vein.    [provider]  meloxicam  (MOBIC ) 7.5 MG tablet Take 1 tablet (7.5 mg total) by mouth daily. 10/13/23   Borders, Fonda SAUNDERS, NP  Multiple Vitamins-Minerals (MULTIVITAMIN WITH MINERALS) tablet Take 1 tablet by mouth daily.    [provider]  nicotine  (NICODERM CQ  - DOSED IN MG/24 HOURS) 14 mg/24hr patch PLACE 1 PATCH ONTO THE SKIN DAILY Patient not taking: Reported on 11/13/2023 01/21/22   Babara Call, MD  ondansetron  (ZOFRAN ) 4  MG tablet Take 1 tablet (4 mg total) by mouth every 8 (eight) hours as needed for nausea or vomiting. 08/26/23   Malva PARAS, MD  Oxycodone  HCl 10 MG TABS Take 1 tablet (10 mg total) by mouth every 4 (four) hours as needed (pain). 12/08/23   Borders, Fonda SAUNDERS, NP  predniSONE (STERAPRED UNI-PAK 21 TAB) 10 MG (21) TBPK tablet Take by mouth daily. Take 6 tabs by mouth daily  for 1 days, then 5 tabs for 1 days, then 4 tabs for 1 days, then 3 tabs for 1 days, 2 tabs for 1 days, then 1 tab by mouth daily for 1 days 12/08/23    Teresa Shelba SAUNDERS, NP  venlafaxine  (EFFEXOR ) 37.5 MG tablet Take 1 tablet (37.5 mg total) by mouth daily. 09/18/23   Babara Call, MD    Family History Family History  Problem Relation Age of Onset   COPD Mother    Heart attack Father     Social History Social History   Tobacco Use   Smoking status: Former    Current packs/day: 0.00    Average packs/day: 1 pack/day for 45.0 years (45.0 ttl pk-yrs)    Types: Cigarettes    Start date: 07/09/1977    Quit date: 07/10/2022    Years since quitting: 1.4   Smokeless tobacco: Never  Substance Use Topics   Alcohol use: Not Currently   Drug use: Never     Allergies   Patient has no known allergies.   Review of Systems Review of Systems  Respiratory:  Positive for cough.   Cardiovascular:  Positive for chest pain.     Physical Exam Triage Vital Signs ED Triage Vitals  Encounter Vitals Group     BP 12/08/23 1744 118/71     Girls Systolic BP Percentile --      Girls Diastolic BP Percentile --      Boys Systolic BP Percentile --      Boys Diastolic BP Percentile --      Pulse Rate 12/08/23 1744 75     Resp 12/08/23 1744 14     Temp 12/08/23 1744 99 F (37.2 C)     Temp Source 12/08/23 1744 Oral     SpO2 12/08/23 1744 97 %     Weight --      Height --      Head Circumference --      Peak Flow --      Pain Score 12/08/23 1739 10     Pain Loc --      Pain Education --      Exclude from Growth Chart --    No data found.  Updated Vital Signs BP 118/71   Pulse 75   Temp 99 F (37.2 C) (Oral)   Resp 14   SpO2 97%   Visual Acuity Right Eye Distance:   Left Eye Distance:   Bilateral Distance:    Right Eye Near:   Left Eye Near:    Bilateral Near:     Physical Exam Constitutional:      Appearance: Normal appearance.  Eyes:     Extraocular Movements: Extraocular movements intact.  Cardiovascular:     Rate and Rhythm: Normal rate and regular rhythm.     Pulses: Normal pulses.     Heart sounds: Normal heart  sounds.  Pulmonary:     Effort: Pulmonary effort is normal.     Breath sounds: Normal breath sounds.  Chest:     Comments: Tenderness  present approximately over ribs 4 and 5 to the left flank, no ecchymosis swelling or deformity, chest wall is symmetrical Neurological:     Mental Status: He is alert and oriented to person, place, and time. Mental status is at baseline.      UC Treatments / Results  Labs (all labs ordered are listed, but only abnormal results are displayed) Labs Reviewed - No data to display  EKG   Radiology No results found.  Procedures Procedures (including critical care time)  Medications Ordered in UC Medications  ketorolac (TORADOL) 30 MG/ML injection 30 mg (30 mg Intramuscular Given 12/08/23 1832)    Initial Impression / Assessment and Plan / UC Course  I have reviewed the triage vital signs and the nursing notes.  Pertinent labs & imaging results that were available during my care of the patient were reviewed by me and considered in my medical decision making (see chart for details).  Acute URI, rib pain on the left side  Vital signs are stable, O2 saturation 97% on room air and lungs are clear to auscultation, low suspicion for pneumonia or bronchitis at this time therefore imaging deferred, discussed with patient and in agreement with plan of care, respiratory symptoms present for 2 weeks without signs of resolution, prescribed Augmentin, chest pain is reproducible most likely muscular irritation due to persistent cough, EKG shows normal sinus, prescribed prednisone and Tessalon, Toradol IM given prior to discharge, recommended nonpharmacological supportive care and advised follow-up with urgent care as needed Final Clinical Impressions(s) / UC Diagnoses   Final diagnoses:  Acute URI  Rib pain on left side     Discharge Instructions      On exam your lungs are clear and you are getting enough air without assistance therefore we have held off  on a chest x-ray  On exam able to press on the left side of your body and replicate your pain which is reassuring as this is most likely muscular exacerbated by the coughing  Begin Augmentin twice daily for 7 days as your symptoms are present for 2 weeks  You have been given an injection of Toradol to help reduce pain and ideally will start to see some relief within an hour, starting tomorrow take oral prednisone as directed, avoid use of ibuprofen  and meloxicam  during treatment but may use Tylenol  and oxycodone  as needed  You may use Tessalon pill at as needed for cough     You can take Tylenol  as needed for fever reduction and pain relief.   For cough: honey 1/2 to 1 teaspoon (you can dilute the honey in water or another fluid).  You can also use guaifenesin and dextromethorphan for cough. You can use a humidifier for chest congestion and cough.  If you don't have a humidifier, you can sit in the bathroom with the hot shower running.      For sore throat: try warm salt water gargles, cepacol lozenges, throat spray, warm tea or water with lemon/honey, popsicles or ice, or OTC cold relief medicine for throat discomfort.   For congestion: take a daily anti-histamine like Zyrtec, Claritin, and a oral decongestant, such as pseudoephedrine.  You can also use Flonase 1-2 sprays in each nostril daily.   It is important to stay hydrated: drink plenty of fluids (water, gatorade/powerade/pedialyte, juices, or teas) to keep your throat moisturized and help further relieve irritation/discomfort.    ED Prescriptions     Medication Sig Dispense Auth. Provider   amoxicillin-clavulanate (AUGMENTIN) 875-125 MG  tablet Take 1 tablet by mouth every 12 (twelve) hours. 14 tablet Torianna Junio R, NP   predniSONE (STERAPRED UNI-PAK 21 TAB) 10 MG (21) TBPK tablet  (Status: Discontinued) Take by mouth daily. Take 6 tabs by mouth daily  for 1 days, then 5 tabs for 1 days, then 4 tabs for 1 days, then 3 tabs for 1  days, 2 tabs for 1 days, then 1 tab by mouth daily for 1 days 21 tablet Norelle Runnion R, NP   benzonatate (TESSALON) 100 MG capsule Take 1 capsule (100 mg total) by mouth every 8 (eight) hours. 21 capsule Janyah Singleterry R, NP   predniSONE (STERAPRED UNI-PAK 21 TAB) 10 MG (21) TBPK tablet Take by mouth daily. Take 6 tabs by mouth daily  for 1 days, then 5 tabs for 1 days, then 4 tabs for 1 days, then 3 tabs for 1 days, 2 tabs for 1 days, then 1 tab by mouth daily for 1 days 21 tablet Kayleana Waites, Shelba SAUNDERS, NP      PDMP not reviewed this encounter.   Teresa Shelba SAUNDERS, [place] 12/09/23 [phone]

## 2023-12-09 NOTE — Telephone Encounter (Signed)
 [employer] called to see if they the 1 is helping to pay his meds.  Spoke to [place] and she says that he did give him some money for the [employer] and [place] approved the 8 dollars and some cent.  Then they will put it on on the board to find for this med.

## 2023-12-19 ENCOUNTER — Inpatient Hospital Stay

## 2023-12-19 ENCOUNTER — Encounter: Payer: Self-pay | Admitting: Hospice and Palliative Medicine

## 2023-12-19 ENCOUNTER — Other Ambulatory Visit: Payer: Self-pay | Admitting: *Deleted

## 2023-12-19 ENCOUNTER — Inpatient Hospital Stay: Attending: Oncology | Admitting: Hospice and Palliative Medicine

## 2023-12-19 ENCOUNTER — Telehealth: Payer: Self-pay | Admitting: *Deleted

## 2023-12-19 ENCOUNTER — Ambulatory Visit
Admission: RE | Admit: 2023-12-19 | Discharge: 2023-12-19 | Disposition: A | Source: Ambulatory Visit | Attending: Hospice and Palliative Medicine

## 2023-12-19 VITALS — BP 100/62 | HR 76 | Temp 98.6°F | Resp 17 | Wt 160.2 lb

## 2023-12-19 DIAGNOSIS — Z23 Encounter for immunization: Secondary | ICD-10-CM | POA: Diagnosis not present

## 2023-12-19 DIAGNOSIS — G893 Neoplasm related pain (acute) (chronic): Secondary | ICD-10-CM

## 2023-12-19 DIAGNOSIS — I7121 Aneurysm of the ascending aorta, without rupture: Secondary | ICD-10-CM | POA: Diagnosis not present

## 2023-12-19 DIAGNOSIS — Z791 Long term (current) use of non-steroidal anti-inflammatories (NSAID): Secondary | ICD-10-CM | POA: Insufficient documentation

## 2023-12-19 DIAGNOSIS — R071 Chest pain on breathing: Secondary | ICD-10-CM | POA: Diagnosis not present

## 2023-12-19 DIAGNOSIS — E860 Dehydration: Secondary | ICD-10-CM | POA: Insufficient documentation

## 2023-12-19 DIAGNOSIS — C7951 Secondary malignant neoplasm of bone: Secondary | ICD-10-CM | POA: Diagnosis not present

## 2023-12-19 DIAGNOSIS — R079 Chest pain, unspecified: Secondary | ICD-10-CM | POA: Diagnosis not present

## 2023-12-19 DIAGNOSIS — Z7952 Long term (current) use of systemic steroids: Secondary | ICD-10-CM | POA: Insufficient documentation

## 2023-12-19 DIAGNOSIS — C61 Malignant neoplasm of prostate: Secondary | ICD-10-CM | POA: Insufficient documentation

## 2023-12-19 DIAGNOSIS — Z79899 Other long term (current) drug therapy: Secondary | ICD-10-CM | POA: Insufficient documentation

## 2023-12-19 LAB — COMPREHENSIVE METABOLIC PANEL WITH GFR
ALT: 24 U/L (ref 0–44)
AST: 25 U/L (ref 15–41)
Albumin: 3.4 g/dL — ABNORMAL LOW (ref 3.5–5.0)
Alkaline Phosphatase: 148 U/L — ABNORMAL HIGH (ref 38–126)
Anion gap: 8 (ref 5–15)
BUN: 19 mg/dL (ref 8–23)
CO2: 25 mmol/L (ref 22–32)
Calcium: 8.9 mg/dL (ref 8.9–10.3)
Chloride: 104 mmol/L (ref 98–111)
Creatinine, Ser: 0.67 mg/dL (ref 0.61–1.24)
GFR, Estimated: >60 mL/min (ref >60)
Glucose, Bld: 104 mg/dL — ABNORMAL HIGH (ref 70–99)
Potassium: 4.1 mmol/L (ref 3.5–5.1)
Sodium: 137 mmol/L (ref 135–145)
Total Bilirubin: 0.4 mg/dL (ref 0.0–1.2)
Total Protein: 7.3 g/dL (ref 6.5–8.1)

## 2023-12-19 LAB — CBC WITH DIFFERENTIAL/PLATELET
Abs Immature Granulocytes: 0.03 K/uL (ref 0.00–0.07)
Basophils Absolute: 0 K/uL (ref 0.0–0.1)
Basophils Relative: 1 %
Eosinophils Absolute: 0.1 K/uL (ref 0.0–0.5)
Eosinophils Relative: 2 %
HCT: 34.4 % — ABNORMAL LOW (ref 39.0–52.0)
Hemoglobin: 11.8 g/dL — ABNORMAL LOW (ref 13.0–17.0)
Immature Granulocytes: 0 %
Lymphocytes Relative: 17 %
Lymphs Abs: 1.2 K/uL (ref 0.7–4.0)
MCH: 30.6 pg (ref 26.0–34.0)
MCHC: 34.3 g/dL (ref 30.0–36.0)
MCV: 89.1 fL (ref 80.0–100.0)
Monocytes Absolute: 0.8 K/uL (ref 0.1–1.0)
Monocytes Relative: 12 %
Neutro Abs: 4.8 K/uL (ref 1.7–7.7)
Neutrophils Relative %: 68 %
Platelets: 216 K/uL (ref 150–400)
RBC: 3.86 MIL/uL — ABNORMAL LOW (ref 4.22–5.81)
RDW: 13.9 % (ref 11.5–15.5)
WBC: 7 K/uL (ref 4.0–10.5)
nRBC: 0 % (ref 0.0–0.2)

## 2023-12-19 MED ORDER — IOHEXOL 350 MG/ML SOLN
75.0000 mL | Freq: Once | INTRAVENOUS | Status: AC | PRN
  Administered 2023-12-19: 75 mL via INTRAVENOUS

## 2023-12-19 MED ORDER — SODIUM CHLORIDE 0.9 % IV SOLN
INTRAVENOUS | Status: DC
  Filled 2023-12-19 (×2): qty 250

## 2023-12-19 MED ORDER — OXYCODONE HCL 10 MG PO TABS: 10.0000 mg | ORAL_TABLET | ORAL | 0 refills | Status: DC | PRN

## 2023-12-19 MED ORDER — MIRTAZAPINE 7.5 MG PO TABS: 7.5000 mg | ORAL_TABLET | Freq: Every day | ORAL | 0 refills | Status: DC

## 2023-12-19 MED ORDER — MORPHINE SULFATE ER 15 MG PO TBCR: 15.0000 mg | EXTENDED_RELEASE_TABLET | Freq: Two times a day (BID) | ORAL | 0 refills | Status: DC

## 2023-12-19 NOTE — Progress Notes (Signed)
 Symptom Management Clinic [facility] [phone] [phone]  Patient Care Team: Patient, No Pcp Per as PCP - General (General Practice) Babara Call, MD as Consulting Physician (Oncology)   NAME OF PATIENT: Cody Vaughan  [id number]  [birth year]   DATE OF VISIT: 12/19/23  REASON FOR CONSULT: Cody Vaughan is a 63 y.o. 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  Patient is on treatment with Pluvicto .  INTERVAL HISTORY: Patient presents to [facility] today with complaint of poor oral intake.  He says that he feels he is dehydrated.  He has also had 2 weeks of intermittent chest pain, which radiates into his shoulders.  Patient says he at times hurts to take a deep breath.  He denies cough, congestion, fever or chills.  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  He has been taking his oxycodone  with limited benefit.  He did restart his meloxicam  and felt that that helped.  Patient was seen in urgent care for same.  Denies any neurologic complaints. Denies recent fevers or illnesses. Denies any easy bleeding or bruising. Denies chest pain. Denies any nausea, vomiting, constipation, or diarrhea. Denies urinary complaints. Patient offers no further specific complaints today.   PAST MEDICAL HISTORY: Past Medical History:  Diagnosis Date   Prostate cancer (HCC)    2022    PAST SURGICAL HISTORY:  Past Surgical History:  Procedure Laterality Date   PORTA CATH INSERTION N/A 07/10/2020   Procedure: PORTA CATH INSERTION;  Surgeon: Marea Selinda GORMAN, MD;  Location: [facility];  Service: Cardiovascular;  Laterality: N/A;   PROSTATE BIOPSY N/A 06/16/2020   Procedure: PROSTATE BIOPSY;  Surgeon: Francisca Redell BROCKS, MD;  Location: [facility];  Service: Urology;  Laterality: N/A;   SHOULDER SURGERY     1985   TRANSRECTAL ULTRASOUND N/A 06/16/2020    Procedure: TRANSRECTAL ULTRASOUND;  Surgeon: Francisca Redell BROCKS, MD;  Location: [facility] OR[facility];  Service: Urology;  Laterality: N/A;    HEMATOLOGY/ONCOLOGY HISTORY:  Oncology History  Prostate cancer (HCC)  06/14/2020 Initial Diagnosis   Prostate cancer  -05/27/2020 patient presented to emergency room for evaluation of generalized weakness and and body pain.  Prior to the presentations, patient also had history of tooth abscess and was prescribed antibiotics. Reports acute on chronic right-sided lumbar pain without any prior trauma history.  Profound weakness.  Decreased appetite and loss of weight about 10 pounds during the past few months.  Also complained intermittent right upper quadrant discomfort  -06/16/2020, patient is status post prostate biopsy-8 out of 12 cores pathology  positive for Anicar  adenocarcinoma.  Highest Gleason score 5+4 -06/20/2019, patient received loading dose of Firmagon  240 mg   06/18/2020 Imaging   MRI thoracic, lumbar, sacrum is compatible with diffuse osseous metastasis in the visualized thoracic lumbar spine, sacrum and bilateral iliac bones.  No definitive dural based tumor is identified.  No compression fracture.  Spine spondylosis   06/19/2020 Imaging   bone scan showed widespread osseous metastatic disease involving the appendicular and axial skeleton   06/23/2020 Cancer Staging   Staging form: Prostate, AJCC 8th Edition - Clinical stage from 06/23/2020: Stage IVB (cT2c, cNX, cM1b, PSA: 678, Grade Group: 5) - Signed by Babara Call, MD on 06/23/2020 Prostate specific antigen (PSA) range: 20 or greater Histologic grading system: 5 grade system   07/10/2020 Procedure   Dr. Marea placed  Mediport   07/17/2020 -  Chemotherapy   DOCETAXEL  Q21D x 6       08/07/2020 - 08/18/2020 Radiation Therapy   Pelvis Radiation   03/06/2021 -  Chemotherapy   started on darolutamide   [ARASENS trial, the combination of darolutamide  plus docetaxel  is an approved option for treatment of  metastatic CSPC- Improved OS, time to develop castration resistant disease].      ALLERGIES:  has no known allergies.  MEDICATIONS:  Current Outpatient Medications  Medication Sig Dispense Refill   acetaminophen  (TYLENOL ) 325 MG tablet Take 650 mg by mouth every 6 (six) hours as needed for moderate pain or mild pain.     amoxicillin-clavulanate (AUGMENTIN) 875-125 MG tablet Take 1 tablet by mouth every 12 (twelve) hours. 14 tablet 0   Calcium  600-400 MG-UNIT CHEW Chew 2 tablets by mouth daily. 60 tablet 3   Lutetium Lu 177 Vipivotide Tet (PLUVICTO  IV) Inject into the vein.     meloxicam  (MOBIC ) 7.5 MG tablet Take 1 tablet (7.5 mg total) by mouth daily. 30 tablet 2   mirtazapine  (REMERON ) 7.5 MG tablet Take 1 tablet (7.5 mg total) by mouth at bedtime. 30 tablet 0   morphine  (MS CONTIN ) 15 MG 12 hr tablet Take 1 tablet (15 mg total) by mouth every 12 (twelve) hours. 60 tablet 0   Multiple Vitamins-Minerals (MULTIVITAMIN WITH MINERALS) tablet Take 1 tablet by mouth daily.     ondansetron  (ZOFRAN ) 4 MG tablet Take 1 tablet (4 mg total) by mouth every 8 (eight) hours as needed for nausea or vomiting. 20 tablet 0   Oxycodone  HCl 10 MG TABS Take 1 tablet (10 mg total) by mouth every 4 (four) hours as needed (pain). 60 tablet 0   predniSONE (STERAPRED UNI-PAK 21 TAB) 10 MG (21) TBPK tablet Take by mouth daily. Take 6 tabs by mouth daily  for 1 days, then 5 tabs for 1 days, then 4 tabs for 1 days, then 3 tabs for 1 days, 2 tabs for 1 days, then 1 tab by mouth daily for 1 days 21 tablet 0   benzonatate (TESSALON) 100 MG capsule Take 1 capsule (100 mg total) by mouth every 8 (eight) hours. (Patient not taking: Reported on 12/19/2023) 21 capsule 0   nicotine  (NICODERM CQ  - DOSED IN MG/24 HOURS) 14 mg/24hr patch PLACE 1 PATCH ONTO THE SKIN DAILY (Patient not taking: Reported on 12/19/2023) 30 patch 0   No current facility-administered medications for this visit.   Facility-Administered Medications  Ordered in Other Visits  Medication Dose Route Frequency Provider Last Rate Last Admin   0.9 %  sodium chloride  infusion   Intravenous Continuous Renne Platts, Fonda SAUNDERS, NP 999 mL/hr at 12/19/23 1204 New Bag at 12/19/23 1204   heparin  lock flush 100 UNIT/ML injection             VITAL SIGNS: BP 100/62   Pulse 76   Temp 98.6 F (37 C)   Resp 17   Wt 160 lb 3.2 oz (72.7 kg)   SpO2 97%   BMI 22.34 kg/m  Filed Weights   12/19/23 1043  Weight: 160 lb 3.2 oz (72.7 kg)    Estimated body mass index is 22.34 kg/m as calculated from the following:   Height as of 11/27/21: 5' 11 (1.803 m).   Weight as of this encounter: 160 lb 3.2 oz (72.7 kg).  LABS: CBC:    Component Value Date/Time   WBC 7.0 12/19/2023 1030   HGB 11.8 (L) 12/19/2023 1030  HGB 11.4 (L) 11/13/2023 0943   HGB 12.6 (L) 09/07/2012 0431   HCT 34.4 (L) 12/19/2023 1030   HCT 36.5 (L) 09/07/2012 0431   PLT 216 12/19/2023 1030   PLT 186 11/13/2023 0943   PLT 288 09/07/2012 0431   MCV 89.1 12/19/2023 1030   MCV 89 09/07/2012 0431   NEUTROABS 4.8 12/19/2023 1030   NEUTROABS 6.2 09/07/2012 0431   LYMPHSABS 1.2 12/19/2023 1030   LYMPHSABS 1.8 09/07/2012 0431   MONOABS 0.8 12/19/2023 1030   MONOABS 1.6 (H) 09/07/2012 0431   EOSABS 0.1 12/19/2023 1030   EOSABS 0.3 09/07/2012 0431   BASOSABS 0.0 12/19/2023 1030   BASOSABS 0.1 09/07/2012 0431   Comprehensive Metabolic Panel:    Component Value Date/Time   NA 137 12/19/2023 1030   NA 142 09/06/2012 0429   K 4.1 12/19/2023 1030   K 3.5 09/06/2012 0429   CL 104 12/19/2023 1030   CL 106 09/06/2012 0429   CO2 25 12/19/2023 1030   CO2 28 09/06/2012 0429   BUN 19 12/19/2023 1030   BUN 9 09/06/2012 0429   CREATININE 0.67 12/19/2023 1030   CREATININE 0.62 11/13/2023 0943   CREATININE 0.97 09/06/2012 0429   GLUCOSE 104 (H) 12/19/2023 1030   GLUCOSE 105 (H) 09/06/2012 0429   CALCIUM  8.9 12/19/2023 1030   CALCIUM  8.5 09/06/2012 0429   AST 25 12/19/2023 1030   AST 27  11/13/2023 0943   ALT 24 12/19/2023 1030   ALT 24 11/13/2023 0943   ALT 43 09/02/2012 0658   ALKPHOS 148 (H) 12/19/2023 1030   ALKPHOS 71 09/02/2012 0658   BILITOT 0.4 12/19/2023 1030   BILITOT 0.4 11/13/2023 0943   PROT 7.3 12/19/2023 1030   PROT 5.9 (L) 09/02/2012 0658   ALBUMIN 3.4 (L) 12/19/2023 1030   ALBUMIN 2.6 (L) 09/02/2012 0658    RADIOGRAPHIC STUDIES: NM PLUVICTO  ADMINISTRATION Result Date: 11/27/2023 CLINICAL DATA:  Castrate resistant prostate carcinoma. Skeletal metastasis. EXAM: NUCLEAR MEDICINE PLUVICTO  INJECTION TECHNIQUE: Infusion: The nuclear medicine technologist and I personally verified the dose activity to be delivered as specified in the written directive, and verified the patient identification via 2 separate methods. Initial flush of the intravenous catheter was performed was sterile saline. The dose syringe was connected to the catheter and the Lu-177 Pluvicto  administered over a 1 to 10 min infusion. Single 10 cc lushes with normal saline follow the dose. No complications were noted. The entire IV tubing, venocatheter, stopcock and syringes was removed in total, placed in a disposal bag and sent for assay of the residual activity, which will be reported at a later time in our EMR by the physics staff. Pressure was applied to the venipuncture site, and a compression bandage placed. Patient monitored for 1 hour following infusion. Radiation Safety personnel were present to perform the discharge survey, as detailed on their documentation. After a short period of observation, the patient had his IV removed. RADIOPHARMACEUTICALS:  Two hundred two microcuries Lu-177 PLUVICTO  FINDINGS: Current Infusion: 3 Planned Infusions: 6 Patient presented to nuclear medicine for treatment. The patient's most recent blood counts were reviewed and remains a good candidate to proceed with Lu-177 Pluvicto . PSA stable at 7.4.  No mild suppression renal toxicity. Patient reports 1 day of flu-like  symptoms following last therapy. Intermittent back pain. The patient was situated in an infusion suite with a contact barrier placed under the arm. Intravenous access was established, using sterile technique, and a normal saline infusion from a syringe was started. Micro-dosimetry:  The prescribed radiation activity was assayed and confirmed to be within specified tolerance. IMPRESSION: Current Infusion: 3 Planned Infusions: 6 The patient tolerated the infusion well. The patient will return in 6 weeks for ongoing care. Electronically Signed   By: Jackquline Boxer M.D.   On: 11/27/2023 16:31    PERFORMANCE STATUS (ECOG) : 1 - Symptomatic but completely ambulatory  Review of Systems Unless otherwise noted, a complete review of systems is negative.  Physical Exam General: NAD Cardiovascular: regular rate and rhythm Pulmonary: clear anterior/posterior fields Abdomen: soft, nontender, + bowel sounds GU: no suprapubic tenderness Extremities: no edema, no joint deformities Skin: no rashes Neurological: Weakness but otherwise nonfocal  IMPRESSION/PLAN: Metastatic prostate cancer -on Pluvicto   Chest pain -EKG appears unchanged from baseline.  Patient is nontoxic-appearing.  Patient works as a Administrator and with recent fall, so this could be musculoskeletal or related to his osseous metastatic disease.  However, discussed with Dr. Babara and will obtain CTA of the chest and rule out PE.  Neoplasm related pain -refill oxycodone .  Start MS Contin  15 mg every 12 hours.  May use meloxicam  as needed.  PDMP reviewed.  Daily bowel regimen.  Poor oral intake -discussed high-calorie/high-protein foods and recommended daily oral nutritional supplements.  Referral to nutrition.  Will rotate from Effexor  to mirtazapine  7.5 mg nightly.  Proceed with IV fluids and supportive care today.  MD follow-up in 2 weeks.  Case and plan discussed with Dr. Babara   Patient expressed understanding and was in agreement with this  plan. He also understands that He can call clinic at any time with any questions, concerns, or complaints.   Thank you for allowing me to participate in the care of this very pleasant patient.   Time Total: 45 minutes  Visit consisted of counseling and education dealing with the complex and emotionally intense issues of symptom management in the setting of serious illness.Greater than 50%  of this time was spent counseling and coordinating care related to the above assessment and plan.  Signed by: Fonda Mower, PhD, NP-C

## 2023-12-19 NOTE — Telephone Encounter (Signed)
 I called the wife and and patient and the pt. Will come at 10:00 for labs am then see Josh and IVF if needed. They are ok with and will be here

## 2023-12-19 NOTE — Telephone Encounter (Signed)
 Wife says that she feels like he had an delayed reaction from his treatment several weeks ago.  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  The wife feels like he needs some IV fluids because he is so weak.

## 2023-12-25 ENCOUNTER — Inpatient Hospital Stay

## 2024-01-02 ENCOUNTER — Encounter: Payer: Self-pay | Admitting: Oncology

## 2024-01-02 ENCOUNTER — Inpatient Hospital Stay

## 2024-01-02 ENCOUNTER — Inpatient Hospital Stay: Admitting: Oncology

## 2024-01-02 VITALS — BP 109/75 | HR 85 | Temp 96.7°F | Resp 18 | Wt 164.7 lb

## 2024-01-02 DIAGNOSIS — Z79899 Other long term (current) drug therapy: Secondary | ICD-10-CM | POA: Diagnosis not present

## 2024-01-02 DIAGNOSIS — D649 Anemia, unspecified: Secondary | ICD-10-CM | POA: Diagnosis not present

## 2024-01-02 DIAGNOSIS — M899 Disorder of bone, unspecified: Secondary | ICD-10-CM

## 2024-01-02 DIAGNOSIS — C7951 Secondary malignant neoplasm of bone: Secondary | ICD-10-CM | POA: Diagnosis not present

## 2024-01-02 DIAGNOSIS — C61 Malignant neoplasm of prostate: Secondary | ICD-10-CM | POA: Diagnosis not present

## 2024-01-02 DIAGNOSIS — Z23 Encounter for immunization: Secondary | ICD-10-CM | POA: Diagnosis not present

## 2024-01-02 DIAGNOSIS — G893 Neoplasm related pain (acute) (chronic): Secondary | ICD-10-CM | POA: Diagnosis not present

## 2024-01-02 DIAGNOSIS — R232 Flushing: Secondary | ICD-10-CM | POA: Diagnosis not present

## 2024-01-02 DIAGNOSIS — Z791 Long term (current) use of non-steroidal anti-inflammatories (NSAID): Secondary | ICD-10-CM | POA: Diagnosis not present

## 2024-01-02 DIAGNOSIS — Z7952 Long term (current) use of systemic steroids: Secondary | ICD-10-CM | POA: Diagnosis not present

## 2024-01-02 DIAGNOSIS — E860 Dehydration: Secondary | ICD-10-CM | POA: Diagnosis not present

## 2024-01-02 LAB — PSA: Prostatic Specific Antigen: 10.48 ng/mL — ABNORMAL HIGH (ref 0.00–4.00)

## 2024-01-02 LAB — CMP (CANCER CENTER ONLY)
ALT: 30 U/L (ref 0–44)
AST: 31 U/L (ref 15–41)
Albumin: 3 g/dL — ABNORMAL LOW (ref 3.5–5.0)
Alkaline Phosphatase: 126 U/L (ref 38–126)
Anion gap: 10 (ref 5–15)
BUN: 16 mg/dL (ref 8–23)
CO2: 25 mmol/L (ref 22–32)
Calcium: 8.9 mg/dL (ref 8.9–10.3)
Chloride: 101 mmol/L (ref 98–111)
Creatinine: 0.73 mg/dL (ref 0.61–1.24)
GFR, Estimated: >60 mL/min (ref >60)
Glucose, Bld: 139 mg/dL — ABNORMAL HIGH (ref 70–99)
Potassium: 3.6 mmol/L (ref 3.5–5.1)
Sodium: 136 mmol/L (ref 135–145)
Total Bilirubin: 0.3 mg/dL (ref 0.0–1.2)
Total Protein: 7.1 g/dL (ref 6.5–8.1)

## 2024-01-02 LAB — CBC WITH DIFFERENTIAL (CANCER CENTER ONLY)
Abs Immature Granulocytes: 0.02 K/uL (ref 0.00–0.07)
Basophils Absolute: 0.1 K/uL (ref 0.0–0.1)
Basophils Relative: 1 %
Eosinophils Absolute: 0.3 K/uL (ref 0.0–0.5)
Eosinophils Relative: 4 %
HCT: 32.6 % — ABNORMAL LOW (ref 39.0–52.0)
Hemoglobin: 10.9 g/dL — ABNORMAL LOW (ref 13.0–17.0)
Immature Granulocytes: 0 %
Lymphocytes Relative: 19 %
Lymphs Abs: 1.2 K/uL (ref 0.7–4.0)
MCH: 29.8 pg (ref 26.0–34.0)
MCHC: 33.4 g/dL (ref 30.0–36.0)
MCV: 89.1 fL (ref 80.0–100.0)
Monocytes Absolute: 0.6 K/uL (ref 0.1–1.0)
Monocytes Relative: 10 %
Neutro Abs: 4.1 K/uL (ref 1.7–7.7)
Neutrophils Relative %: 66 %
Platelet Count: 374 K/uL (ref 150–400)
RBC: 3.66 MIL/uL — ABNORMAL LOW (ref 4.22–5.81)
RDW: 13.5 % (ref 11.5–15.5)
WBC Count: 6.3 K/uL (ref 4.0–10.5)
nRBC: 0 % (ref 0.0–0.2)

## 2024-01-02 MED ORDER — INFLUENZA VAC SPLIT HIGH-DOSE 0.5 ML IM SUSY
0.5000 mL | PREFILLED_SYRINGE | Freq: Once | INTRAMUSCULAR | Status: AC
  Administered 2024-01-02: 0.5 mL via INTRAMUSCULAR
  Filled 2024-01-02: qty 0.5

## 2024-01-02 NOTE — Assessment & Plan Note (Addendum)
 Recommend Oxycodone  5-10mg  Q6 PRN.  MS Contin  50 mg twice daily

## 2024-01-02 NOTE — Assessment & Plan Note (Addendum)
 Hb is  decreased. Due to treatments.  Monitor.

## 2024-01-02 NOTE — Progress Notes (Addendum)
 Hematology/Oncology Progress note [phone] [phone]      CHIEF COMPLAINTS/REASON FOR VISIT:  Follow-up for prostate cancer treatment   ASSESSMENT & PLAN:   Cancer Staging  Prostate cancer [facility]) Staging form: Prostate, AJCC 8th Edition - Clinical stage from 06/23/2020: Stage IVB (cT2c, cNX, cM1b, PSA: 678, Grade Group: 5) - Signed by Babara Call, MD on 06/23/2020   Prostate cancer [facility]) Metastatic prostate cancer with bone metastasis,  castration resistant  s/p Docetaxel  x 6 cycles, followed by Darolutamide .   NGS molecular studies.-[facility] NGS showed Tp53, TMB 5.3, MSI stable. Patient declined genetic testing.  Labs reviewed and discussed with patient.   PSA has been stable,today's level is pending Recent CT scan showed Progressive lucencies within the thoracic vertebral bodies, most pronounced at T5, T6, and T10 patient is currently asymptomatic.  If he develops symptoms, consider radiation treatments. Continue androgen deprivation therapy-Eligard  45 mg every 6 months- next due Dec 2025 on pluvicto  treatments, patient should have 3 out of the 6 planned treatments.   PSA is rising. Recommend to resume darolutamide  600 mg twice daily  Plan to repeat PSMA after 6 treatments.   Neoplasm related pain Recommend Oxycodone  5-10mg  Q6 PRN.  MS Contin  50 mg twice daily   Normocytic anemia Hb is  decreased. Due to treatments.  Monitor.   Vasomotor flushing Hot flash due to ADT, continue Effexor  37.5mg  daily.  Bone lesion Recommend bisphosphonate, he has full mouth denture.  Rationale and side effects were reviewed with patient.  He agrees.  Recommend calcium  1200mg  daily and Vitamin D 1000 units.   Orders Placed This Encounter  Procedures   CMP (Cancer Center only)    Standing Status:   Future    Expected Date:   02/02/2024    Expiration Date:   05/02/2024   CBC with Differential (Cancer Center Only)    Standing Status:   Future    Expected Date:    02/02/2024    Expiration Date:   05/02/2024   PSA    Standing Status:   Future    Expected Date:   02/02/2024    Expiration Date:   05/02/2024   Follow up per LOS  All questions were answered. The patient knows to call the clinic with any problems, questions or concerns.  Call Babara, MD, PhD [facility] Hematology Oncology 01/02/2024    HISTORY OF PRESENTING ILLNESS:  05/27/2020 patient presented to emergency room for evaluation of generalized weakness and and body pain.  Prior to the presentations, patient also had history of tooth abscess and was prescribed antibiotics. Reports acute on chronic right-sided lumbar pain without any prior trauma history.  Profound weakness.  Decreased appetite and loss of weight about 10 pounds during the past few months.  Also complained intermittent right upper quadrant discomfort  326 04/10/2020 CT abdomen pelvis showed a diffusely patchy sclerotic appearance of the osseous structures suspicious for osseous metastatic disease.  Prostate gland is heterogeneous in appearance.  Correlate with PSA.  Patient had a mild superior endplate compression deformity of L4 with less than 10% vertebral body height loss.  H indeterminate.  Fusiform infrarenal abdominal aortic aneurysm measuring up to 3.5 cm.  Follow-up in 2 years. Suspicious findings for early acute uncomplicated appendicitis.  Patient denies any right lower quadrant pain.  In the emergency room, he has a negative Murphy sign.  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  Highest Gleason score 5+4  Postprocedure, patient has had hematuria and hematochezia, bilateral lower extremity pain, AKI orthostatic hypovolemia, sepsis and was admitted and treated.  Patient was discharged on a course of antibiotics.  06/18/2020 MRI thoracic, lumbar, sacrum is compatible with diffuse osseous metastasis in the visualized thoracic lumbar spine, sacrum and  bilateral iliac bones.  No definitive dural based tumor is identified.  No compression fracture.  Spine spondylosis  06/19/2020, patient received loading dose of Firmagon  240 mg x 1. #06/22/2020, bone scan showed widespread osseous metastatic disease involving the appendicular and axial skeleton #April 2022. COVID infection. #07/10/2020, Dr. Marea placed Mediport #07/17/2020-10/30/2020 6 cycles of docetaxel .   #03/06/2021, started on darolutamide   [ARASENS trial, the combination of darolutamide  plus docetaxel  is an approved option for treatment of metastatic CSPC- Improved OS, time to develop castration resistant disease].   #INTERVAL HISTORY TORREZ RENFROE is a 63 y.o. male who has above history reviewed by me today presents for follow up visit for management of metastatic prostate cancer, 08/26/2023 started on Pleuvicto treatments. Plan Q6 weeks x 6  Chronic back pain after work. + hot flash improved after taking Effexor . He had fall episodes and had right side rib cage pain. He takes oxycodone  1-2 times per day and MS contin  15 mg BID. He feels pain is controlled.    His nutritional status has been fluctuating, with a recent gain of four pounds in two weeks. He consumes nutritional supplements like Ensure and Boost a few times a day. He continues to work intermittently, adjusting his workload based on his pain levels  He has experienced side effects from Pluvicto  treatments, including fatigue, aches, chills, and fever-like symptoms, particularly after the second dose.  Review of Systems  Constitutional:  Positive for fatigue. Negative for appetite change, chills, fever and unexpected weight change.       Patient walks independently  HENT:   Negative for hearing loss and voice change.   Eyes:  Negative for eye problems and icterus.  Respiratory:  Negative for chest tightness, cough and shortness of breath.   Cardiovascular:  Negative for chest pain and leg swelling.  Gastrointestinal:  Negative for  abdominal distention and abdominal pain.  Endocrine: Positive for hot flashes.  Genitourinary:  Negative for difficulty urinating, dysuria and frequency.   Musculoskeletal:  Positive for back pain. Negative for arthralgias.  Skin:  Negative for itching and rash.  Neurological:  Negative for light-headedness and numbness.  Hematological:  Negative for adenopathy. Does not bruise/bleed easily.  Psychiatric/Behavioral:  Negative for confusion.     MEDICAL HISTORY:  Past Medical History:  Diagnosis Date   Prostate cancer (HCC)    2022    SURGICAL HISTORY: Past Surgical History:  Procedure Laterality Date   PORTA CATH INSERTION N/A 07/10/2020   Procedure: PORTA CATH INSERTION;  Surgeon: Marea Selinda GORMAN, MD;  Location: [facility] INVASIVE CV LAB;  Service: Cardiovascular;  Laterality: N/A;   PROSTATE BIOPSY N/A 06/16/2020   Procedure: PROSTATE BIOPSY;  Surgeon: Francisca Redell BROCKS, MD;  Location: [facility] ORS;  Service: Urology;  Laterality: N/A;   SHOULDER SURGERY     1985   TRANSRECTAL ULTRASOUND N/A 06/16/2020   Procedure: TRANSRECTAL ULTRASOUND;  Surgeon: Francisca Redell BROCKS, MD;  Location: [facility] ORS;  Service: Urology;  Laterality: N/A;    SOCIAL HISTORY: Social History   Socioeconomic History   Marital status: Married    Spouse name: Not on file   Number of children: Not on file   Years of education: Not on file  Highest education level: Not on file  Occupational History   Occupation: land scaper    Employer: [employer]  Tobacco Use   Smoking status: Former    Current packs/day: 0.00    Average packs/day: 1 pack/day for 45.0 years (45.0 ttl pk-yrs)    Types: Cigarettes    Start date: 07/09/1977    Quit date: 07/10/2022    Years since quitting: 1.4   Smokeless tobacco: Never  Substance and Sexual Activity   Alcohol use: Not Currently   Drug use: Never   Sexual activity: Not Currently  Other Topics Concern   Not on file  Social History Narrative   Not on file    Social Drivers of Health   Financial Resource Strain: Low Risk  (10/13/2023)   Overall Financial Resource Strain (CARDIA)    Difficulty of Paying Living Expenses: Not hard at all  Food Insecurity: No Food Insecurity (10/13/2023)   Hunger Vital Sign    Worried About Running Out of Food in the Last Year: Never true    Ran Out of Food in the Last Year: Never true  Transportation Needs: No Transportation Needs (10/13/2023)   PRAPARE - Administrator, Civil Service (Medical): No    Lack of Transportation (Non-Medical): No  Physical Activity: Not on file  Stress: Not on file  Social Connections: Not on file  Intimate Partner Violence: Unknown (10/13/2023)   Humiliation, Afraid, Rape, and Kick questionnaire    Fear of Current or Ex-Partner: No    Emotionally Abused: No    Physically Abused: No    Sexually Abused: Not on file    FAMILY HISTORY: Family History  Problem Relation Age of Onset   COPD Mother    Heart attack Father     ALLERGIES:  has no known allergies.  MEDICATIONS:  Current Outpatient Medications  Medication Sig Dispense Refill   acetaminophen  (TYLENOL ) 325 MG tablet Take 650 mg by mouth every 6 (six) hours as needed for moderate pain or mild pain.     Calcium  600-400 MG-UNIT CHEW Chew 2 tablets by mouth daily. 60 tablet 3   Lutetium Lu 177 Vipivotide Tet (PLUVICTO  IV) Inject into the vein.     mirtazapine  (REMERON ) 7.5 MG tablet Take 1 tablet (7.5 mg total) by mouth at bedtime. 30 tablet 0   morphine  (MS CONTIN ) 15 MG 12 hr tablet Take 1 tablet (15 mg total) by mouth every 12 (twelve) hours. 60 tablet 0   Multiple Vitamins-Minerals (MULTIVITAMIN WITH MINERALS) tablet Take 1 tablet by mouth daily.     Oxycodone  HCl 10 MG TABS Take 1 tablet (10 mg total) by mouth every 4 (four) hours as needed (pain). 60 tablet 0   amoxicillin -clavulanate (AUGMENTIN ) 875-125 MG tablet Take 1 tablet by mouth every 12 (twelve) hours. (Patient not taking: Reported on  01/02/2024) 14 tablet 0   benzonatate  (TESSALON ) 100 MG capsule Take 1 capsule (100 mg total) by mouth every 8 (eight) hours. (Patient not taking: Reported on 01/02/2024) 21 capsule 0   meloxicam  (MOBIC ) 7.5 MG tablet Take 1 tablet (7.5 mg total) by mouth daily. (Patient not taking: Reported on 01/02/2024) 30 tablet 2   nicotine  (NICODERM CQ  - DOSED IN MG/24 HOURS) 14 mg/24hr patch PLACE 1 PATCH ONTO THE SKIN DAILY (Patient not taking: Reported on 01/02/2024) 30 patch 0   ondansetron  (ZOFRAN ) 4 MG tablet Take 1 tablet (4 mg total) by mouth every 8 (eight) hours as needed for nausea or  vomiting. (Patient not taking: Reported on 01/02/2024) 20 tablet 0   predniSONE  (STERAPRED UNI-PAK 21 TAB) 10 MG (21) TBPK tablet Take by mouth daily. Take 6 tabs by mouth daily  for 1 days, then 5 tabs for 1 days, then 4 tabs for 1 days, then 3 tabs for 1 days, 2 tabs for 1 days, then 1 tab by mouth daily for 1 days (Patient not taking: Reported on 01/02/2024) 21 tablet 0   No current facility-administered medications for this visit.   Facility-Administered Medications Ordered in Other Visits  Medication Dose Route Frequency Provider Last Rate Last Admin   heparin  lock flush 100 UNIT/ML injection              PHYSICAL EXAMINATION: ECOG PERFORMANCE STATUS: 0 - Asymptomatic Vitals:   01/02/24 1009  BP: 109/75  Pulse: 85  Resp: 18  Temp: (!) 96.7 F (35.9 C)  SpO2: 100%   Filed Weights   01/02/24 1009  Weight: 164 lb 11.2 oz (74.7 kg)    Physical Exam Constitutional:      General: He is not in acute distress. HENT:     Head: Normocephalic and atraumatic.  Eyes:     General: No scleral icterus. Cardiovascular:     Rate and Rhythm: Normal rate and regular rhythm.     Heart sounds: Normal heart sounds.  Pulmonary:     Effort: Pulmonary effort is normal. No respiratory distress.     Breath sounds: No wheezing.  Abdominal:     General: Bowel sounds are normal. There is no distension.      Palpations: Abdomen is soft.  Musculoskeletal:        General: No deformity. Normal range of motion.     Cervical back: Normal range of motion and neck supple.  Skin:    General: Skin is warm and dry.     Findings: No erythema or rash.  Neurological:     Mental Status: He is alert and oriented to person, place, and time. Mental status is at baseline.     Cranial Nerves: No cranial nerve deficit.     Coordination: Coordination normal.  Psychiatric:        Mood and Affect: Mood normal.     LABORATORY DATA:  I have reviewed the data as listed    Latest Ref Rng & Units 01/02/2024   10:01 AM 12/19/2023   10:30 AM 11/13/2023    9:43 AM  CBC  WBC 4.0 - 10.5 K/uL 6.3  7.0  6.2   Hemoglobin 13.0 - 17.0 g/dL 89.0  88.1  88.5   Hematocrit 39.0 - 52.0 % 32.6  34.4  34.1   Platelets 150 - 400 K/uL 374  216  186       Latest Ref Rng & Units 01/02/2024   10:01 AM 12/19/2023   10:30 AM 11/13/2023    9:43 AM  CMP  Glucose 70 - 99 mg/dL 860  895  866   BUN 8 - 23 mg/dL 16  19  21    Creatinine 0.61 - 1.24 mg/dL 9.26  9.32  9.37   Sodium 135 - 145 mmol/L 136  137  135   Potassium 3.5 - 5.1 mmol/L 3.6  4.1  4.1   Chloride 98 - 111 mmol/L 101  104  104   CO2 22 - 32 mmol/L 25  25  23    Calcium  8.9 - 10.3 mg/dL 8.9  8.9  8.9   Total Protein 6.5 - 8.1 g/dL  7.1  7.3  6.7   Total Bilirubin 0.0 - 1.2 mg/dL 0.3  0.4  0.4   Alkaline Phos 38 - 126 U/L 126  148  131   AST 15 - 41 U/L 31  25  27    ALT 0 - 44 U/L 30  24  24       RADIOGRAPHIC STUDIES: I have personally reviewed the radiological images as listed and agreed with the findings in the report. CT Angio Chest Pulmonary Embolism (PE) W or WO Contrast Result Date: 12/19/2023 CLINICAL DATA:  Chest pain for 2 weeks, history of metastatic prostate cancer, fell several weeks ago, pain with inspiration EXAM: CT ANGIOGRAPHY CHEST WITH CONTRAST TECHNIQUE: Multidetector CT imaging of the chest was performed using the standard protocol during bolus  administration of intravenous contrast. Multiplanar CT image reconstructions and MIPs were obtained to evaluate the vascular anatomy. RADIATION DOSE REDUCTION: This exam was performed according to the departmental dose-optimization program which includes automated exposure control, adjustment of the mA and/or kV according to patient size and/or use of iterative reconstruction technique. CONTRAST:  75mL OMNIPAQUE  IOHEXOL  350 MG/ML SOLN COMPARISON:  07/29/2023, 12/18/2022 FINDINGS: Cardiovascular: This is a technically adequate evaluation of the pulmonary vasculature. No filling defects or pulmonary emboli. 4.5 cm ascending thoracic aortic aneurysm, with no evidence of dissection. Atherosclerosis throughout the thoracic aorta. The great vessels are patent. Heart is unremarkable without pericardial effusion. Coronary artery atherosclerosis most pronounced within the left main and circumflex distributions. Right chest wall port via internal jugular approach, tip within the superior vena cava. Mediastinum/Nodes: No enlarged mediastinal, hilar, or axillary lymph nodes. Thyroid gland, trachea, and esophagus demonstrate no significant findings. Lungs/Pleura: Stable emphysema. No acute airspace disease, effusion, or pneumothorax. Areas of subpleural linear consolidation within the lung bases likely reflect subsegmental atelectasis or scarring. Central airways are patent. Upper Abdomen: No acute abnormality. Musculoskeletal: Model appearance of the thoracic vertebral bodies consistent with known bony metastases. Progressive lucencies are seen at the T5, T6, and T10 levels compatible with progression of known bony metastases. Increased sclerosis around the lucency at the T10 level could reflect treatment response. Numerous sclerotic lesions are again seen throughout the thoracic cage and sternum, unchanged, and consistent with known bony metastases. There are no acute displaced fractures. Reconstructed images demonstrate no  additional findings. Review of the MIP images confirms the above findings. IMPRESSION: 1. Progressive lucencies within the thoracic vertebral bodies, most pronounced at T5, T6, and T10, concerning for worsening bony metastatic disease. Sclerosis surrounding the large lucency at T10 could reflect post treatment changes. 2. Numerous other sclerotic lesions throughout the thoracic cage and sternum, compatible with known bony metastases, stable. 3. No acute displaced fracture. 4. No evidence of pulmonary embolus. 5. 4.5 cm ascending thoracic aortic aneurysm. Ascending thoracic aortic aneurysm. Recommend semi-annual imaging followup by CTA or MRA and referral to cardiothoracic surgery if not already obtained. This recommendation follows 2010 ACCF/AHA/AATS/ACR/ASA/SCA/SCAI/SIR/STS/SVM Guidelines for the Diagnosis and Management of Patients With Thoracic Aortic Disease. Circulation. 2010; 121: Z733-z630. Aortic aneurysm NOS (ICD10-I71.9) 6. Aortic Atherosclerosis (ICD10-I70.0) and Emphysema (ICD10-J43.9). Electronically Signed   By: Ozell Daring M.D.   On: 12/19/2023 15:40   NM PLUVICTO  ADMINISTRATION Result Date: 11/27/2023 CLINICAL DATA:  Castrate resistant prostate carcinoma. Skeletal metastasis. EXAM: NUCLEAR MEDICINE PLUVICTO  INJECTION TECHNIQUE: Infusion: The nuclear medicine technologist and I personally verified the dose activity to be delivered as specified in the written directive, and verified the patient identification via 2 separate methods. Initial flush of the intravenous catheter  was performed was sterile saline. The dose syringe was connected to the catheter and the Lu-177 Pluvicto  administered over a 1 to 10 min infusion. Single 10 cc lushes with normal saline follow the dose. No complications were noted. The entire IV tubing, venocatheter, stopcock and syringes was removed in total, placed in a disposal bag and sent for assay of the residual activity, which will be reported at a later time in our  EMR by the physics staff. Pressure was applied to the venipuncture site, and a compression bandage placed. Patient monitored for 1 hour following infusion. Radiation Safety personnel were present to perform the discharge survey, as detailed on their documentation. After a short period of observation, the patient had his IV removed. RADIOPHARMACEUTICALS:  Two hundred two microcuries Lu-177 PLUVICTO  FINDINGS: Current Infusion: 3 Planned Infusions: 6 Patient presented to nuclear medicine for treatment. The patient's most recent blood counts were reviewed and remains a good candidate to proceed with Lu-177 Pluvicto . PSA stable at 7.4.  No mild suppression renal toxicity. Patient reports 1 day of flu-like symptoms following last therapy. Intermittent back pain. The patient was situated in an infusion suite with a contact barrier placed under the arm. Intravenous access was established, using sterile technique, and a normal saline infusion from a syringe was started. Micro-dosimetry: The prescribed radiation activity was assayed and confirmed to be within specified tolerance. IMPRESSION: Current Infusion: 3 Planned Infusions: 6 The patient tolerated the infusion well. The patient will return in 6 weeks for ongoing care. Electronically Signed   By: Jackquline Boxer M.D.   On: 11/27/2023 16:31   NM PLUVICTO  ADMINISTRATION Result Date: 10/17/2023 CLINICAL DATA:  63 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 Initial flush of the intravenous catheter was performed was sterile saline. The dose syringe was connected to the catheter and the Lu-177 Pluvicto  administered over a 1 to 10 min infusion. Single 10 cc lushes with normal saline follow the  dose. No complications were noted. The entire IV tubing, venocatheter, stopcock and syringes was removed in total, placed in a disposal bag and sent for assay of the residual activity, which will be reported at a later time in our EMR by the physics staff. Pressure was applied to the venipuncture site, and a compression bandage placed. Patient monitored for 1 hour following infusion. Radiation Safety personnel were present to perform the discharge survey, as detailed on their documentation. After a short period of observation, the patient had his IV removed. RADIOPHARMACEUTICALS:  Two hundred seven microcuries Lu-177 PLUVICTO  FINDINGS: Current Infusion: 2 Planned Infusions: 6 Patient presented to nuclear medicine for treatment. Patient reports no interval adverse effects from initial therapy. No mild suppression renal toxicity. PSA had initial mild elevation and subsequent decrease most consistent with a mild flare response. PSA currently equal 7. The patient's most recent blood counts were reviewed and remains a good candidate to proceed with Lu-177 Pluvicto . The patient was situated in an infusion suite with a contact barrier placed under the arm. Intravenous access was established, using sterile technique, and a normal saline infusion from a syringe was started. Micro-dosimetry: The prescribed radiation activity was assayed and confirmed to be within specified tolerance. IMPRESSION: Current Infusion: 2 Planned Infusions: 6 The patient tolerated the infusion well. The patient will return in one month  for ongoing care. Electronically Signed   By: Jackquline Boxer M.D.   On: 10/17/2023 13:44

## 2024-01-02 NOTE — Assessment & Plan Note (Signed)
Hot flash due to ADT, continue Effexor 37.5mg daily. 

## 2024-01-02 NOTE — Progress Notes (Signed)
 Member has been discharged from [facility]. Please see below Discharge Plan for details. The Member has been provided a copy of their Discharge Plan via the [facility] Care portal. If you have any questions, please contact our Clinical Team at [phone].  Name Cody Vaughan  Date of Birth 23-Feb-1961  Discharge Date: 2024-01-01  Discharged Reason Not Engaged  Discharging to: Referring Specialist Referring Provider: Fonda Mower  Current Psychiatric Medications Unknown (Member did not attend initial behavioral health evaluation) Psychiatric Medication Recommendation Transition N/A (no CC med recs made)  Referrals: None  Discharge Summary Care Delivered: Member has not engaged in services and have not responded to outreach msg Date: Jan 01 2024 Client: Cody Vaughan Gender: Male Age: 63 DOB: 28-Dec-1960  Interim evaluation: Summary of interim assessments have not been completed Current status: Reason for discharge: Member has not engaged in services and have not responded to outreach. Additional details Should referring organization contact: Unknown if Member is in interested in getting support [facility] remains available should patient express interest in the future.  Final Intake Summary None (Member did not attend initial behavioral health evaluation)  [facility] Provider: Rosaline PARAS, [facility] Care Manager

## 2024-01-02 NOTE — Assessment & Plan Note (Signed)
 Recommend bisphosphonate, he has full mouth denture.  Rationale and side effects were reviewed with patient.  He agrees.  Recommend calcium  1200mg  daily and Vitamin D 1000 units.

## 2024-01-02 NOTE — Assessment & Plan Note (Addendum)
 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   NGS molecular studies.-[facility] NGS showed Tp53, TMB 5.3, MSI stable. Patient declined genetic testing.  Labs reviewed and discussed with patient.   PSA has been stable,today's level is pending Recent CT scan showed Progressive lucencies within the thoracic vertebral bodies, most pronounced at T5, T6, and T10 patient is currently asymptomatic.  If he develops symptoms, consider radiation treatments. Continue androgen deprivation therapy-Eligard  45 mg every 6 months- next due Dec 2025 on pluvicto  treatments, patient should have 3 out of the 6 planned treatments.   PSA is rising. Recommend to resume darolutamide  600 mg twice daily  Plan to repeat PSMA after 6 treatments.

## 2024-01-06 ENCOUNTER — Inpatient Hospital Stay: Attending: Oncology

## 2024-01-06 ENCOUNTER — Encounter: Payer: Self-pay | Admitting: Oncology

## 2024-01-06 ENCOUNTER — Inpatient Hospital Stay

## 2024-01-06 DIAGNOSIS — C61 Malignant neoplasm of prostate: Secondary | ICD-10-CM

## 2024-01-06 DIAGNOSIS — G893 Neoplasm related pain (acute) (chronic): Secondary | ICD-10-CM | POA: Insufficient documentation

## 2024-01-06 DIAGNOSIS — C7951 Secondary malignant neoplasm of bone: Secondary | ICD-10-CM | POA: Insufficient documentation

## 2024-01-06 MED ORDER — ZOLEDRONIC ACID 4 MG/100ML IV SOLN
4.0000 mg | Freq: Once | INTRAVENOUS | Status: AC
  Administered 2024-01-06: 4 mg via INTRAVENOUS
  Filled 2024-01-06: qty 100

## 2024-01-06 MED ORDER — SODIUM CHLORIDE 0.9 % IV SOLN
INTRAVENOUS | Status: DC
  Filled 2024-01-06: qty 250

## 2024-01-06 NOTE — Patient Instructions (Signed)

## 2024-01-06 NOTE — Progress Notes (Signed)
 Nutrition Follow-up:  Patient with metastatic prostate cancer.  Currently on pluvicto  and eligard .  Met with patient and wife prior to zometa infusion.  Reports that appetite has increased over the last week or so.  Prior to that decreased appetite (since first infusion of pluvicto ).  Wife prepares meals.  Usually eats cereal, banana, or toast and jelly.  Drinks 2 boost shakes/ensure shakes.  Mid-day has sandwich or crackers with 2 boiled eggs, mixed nuts, cookies.  Dinner is meat and couple of sides.  Denies nausea or bowel issues.      Medications: mirtazapine   Labs: reviewed  Anthropometrics:   Weight 167 lb today 160 lb on 10/17 170 lb on 10/13/23 183 lb on 12/04/22  178 lb on 10/30/20 (last seen by RD)  9% weight loss in the last year, concerning    NUTRITION DIAGNOSIS: Inadequate oral intake ongoing    INTERVENTION:  Encouraged 350 calorie shake Discussed ways to add calories and protein in diet.  Handout provided Encouraged eating q 2-3 hours Continue mirtazapine .    MONITORING, EVALUATION, GOAL: weight trends, intake   NEXT VISIT: phone call Dec 9  Lorianne Malbrough B. Dasie SOLON, CSO, LDN Registered Dietitian [phone]

## 2024-01-07 ENCOUNTER — Inpatient Hospital Stay

## 2024-01-07 NOTE — Written Directive (Cosign Needed)
  PLUVICTO   THERAPY   RADIOPHARMACEUTICAL: Lutetium 177 vipivotide tetraxetan (Pluvicto )     PRESCRIBED DOSE FOR ADMINISTRATION:  200 mCi   ROUTE OFADMINISTRATION:  IV   DIAGNOSIS:  Prostate cancer, Prostate cancer (HCC)    REFERRING PHYSICIAN: Babara Call, MD   TREATMENT #: 4   ADDITIONAL PHYSICIAN COMMENTS/NOTES: NO LABS today  AUTHORIZED USER SIGNATURE & TIME STAMP: Norleen GORMAN Boxer, MD   01/08/24    8:33 AM

## 2024-01-08 ENCOUNTER — Encounter (HOSPITAL_COMMUNITY)
Admission: RE | Admit: 2024-01-08 | Discharge: 2024-01-08 | Disposition: A | Discharge status: Home / Self Care | Source: Ambulatory Visit | Attending: Oncology | Admitting: Oncology

## 2024-01-08 DIAGNOSIS — C61 Malignant neoplasm of prostate: Secondary | ICD-10-CM | POA: Diagnosis not present

## 2024-01-08 DIAGNOSIS — R9721 Rising PSA following treatment for malignant neoplasm of prostate: Secondary | ICD-10-CM | POA: Diagnosis not present

## 2024-01-08 DIAGNOSIS — D649 Anemia, unspecified: Secondary | ICD-10-CM | POA: Diagnosis not present

## 2024-01-08 MED ORDER — SODIUM CHLORIDE 0.9 % IV BOLUS
1000.0000 mL | Freq: Once | INTRAVENOUS | Status: AC
  Administered 2024-01-08: 1000 mL via INTRAVENOUS

## 2024-01-08 MED ORDER — SODIUM CHLORIDE 0.9 % IV BOLUS: 1000.0000 mL | Freq: Once | INTRAVENOUS | Status: DC

## 2024-01-08 MED ORDER — HEPARIN SOD (PORK) LOCK FLUSH 100 UNIT/ML IV SOLN
500.0000 [IU] | Freq: Once | INTRAVENOUS | Status: AC
  Administered 2024-01-08: 500 [IU] via INTRAVENOUS
  Filled 2024-01-08: qty 5

## 2024-01-08 MED ORDER — HEPARIN SOD (PORK) LOCK FLUSH 100 UNIT/ML IV SOLN
INTRAVENOUS | Status: AC
  Filled 2024-01-08: qty 5

## 2024-01-08 MED ORDER — LUTETIUM LU 177 VIPIVOTIDE TET 1000 MBQ/ML IV SOLN
207.9700 | Freq: Once | INTRAVENOUS | Status: AC
  Administered 2024-01-08: 207.97 via INTRAVENOUS

## 2024-01-09 ENCOUNTER — Inpatient Hospital Stay

## 2024-01-09 NOTE — Progress Notes (Signed)
 EXAM: NUCLEAR MEDICINE PLUVICTO  INJECTION 01/08/2024 03:35:42 PM TECHNIQUE: Patient presented to nuclear medicine for treatment of metastatic prostate cancer. PSMA PET scan was reviewed. The patient's most recent labs were reviewed and it was determined to proceed with Lu-177 Pluvicto . Written informed consent was obtained from the patient. Written instructions for radiation safety were provided. The radiopharmaceutical was injected intravenously according to local protocol. RADIOPHARMACEUTICAL: 207 mCi Lu-177 PLUVICTO  COMPARISON: None available. CLINICAL HISTORY: Prostate cancer. Prostate cancer. FINDINGS: Current Infusion: This is the 4th treatment. Patient reports some mild fatigue. No nausea and vomiting following the third treatment. Patient's PSA is mildly elevated in the interval, PSA equals 10 compared to 7 on prior. Mild stable anemia. No leukopenia. Normal renal function. Planned Infusions: 2 IMPRESSION: Fourth of six planned Lu-177 Pluvicto  treatments administered. Patient reports mild fatigue. Mild stable anemia. No leukopenia. Normal renal function.

## 2024-01-13 ENCOUNTER — Inpatient Hospital Stay (HOSPITAL_BASED_OUTPATIENT_CLINIC_OR_DEPARTMENT_OTHER): Admitting: Hospice and Palliative Medicine

## 2024-01-13 DIAGNOSIS — G893 Neoplasm related pain (acute) (chronic): Secondary | ICD-10-CM

## 2024-01-13 DIAGNOSIS — C61 Malignant neoplasm of prostate: Secondary | ICD-10-CM | POA: Diagnosis not present

## 2024-01-13 DIAGNOSIS — Z515 Encounter for palliative care: Secondary | ICD-10-CM

## 2024-01-13 MED ORDER — MIRTAZAPINE 7.5 MG PO TABS: 7.5000 mg | ORAL_TABLET | Freq: Every day | ORAL | 3 refills | Status: DC

## 2024-01-13 MED ORDER — OXYCODONE HCL 10 MG PO TABS: 10.0000 mg | ORAL_TABLET | ORAL | 0 refills | Status: DC | PRN

## 2024-01-13 MED ORDER — MORPHINE SULFATE ER 15 MG PO TBCR: 15.0000 mg | EXTENDED_RELEASE_TABLET | Freq: Two times a day (BID) | ORAL | 0 refills | Status: DC

## 2024-01-13 NOTE — Progress Notes (Signed)
 Virtual Visit via Telephone Note  I connected with Cody Vaughan on 01/13/24 at  3:20 PM EST by telephone and verified that I am speaking with the correct person using two identifiers.  Location: Patient: Home Provider: Clinic   I discussed the limitations, risks, security and privacy concerns of performing an evaluation and management service by telephone and the availability of in person appointments. I also discussed with the patient that there may be a patient responsible charge related to this service. The patient expressed understanding and agreed to proceed.   History of Present Illness: Cody Vaughan is a 63 y.o. 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  Patient has had ongoing pain.  He is referred to palliative care to address goals and manage ongoing symptoms.  Observations/Objective: I spoke with patient by phone.  He reports he is doing well.  Still working in his lawn care business.  States that pain is tolerable on current regimen of MS Contin  and oxycodone .  Denies any adverse effects from pain medications.  Request refill of both today.  Patient states his appetite has improved on mirtazapine .  Moods are stable.  Assessment and Plan: Prostate Cancer -on Pluvicto   Neoplasm related pain -refill MS Contin /oxycodone .  PDMP reviewed.  Follow Up Instructions: Follow-up telephone visit 2 months   I discussed the assessment and treatment plan with the patient. The patient was provided an opportunity to ask questions and all were answered. The patient agreed with the plan and demonstrated an understanding of the instructions.   The patient was advised to call back or seek an in-person evaluation if the symptoms worsen or if the condition fails to improve as anticipated.  I provided 10 minutes of non-face-to-face time during this encounter.   FONDA JONELLE MOWER, NP

## 2024-01-23 ENCOUNTER — Telehealth: Payer: Self-pay | Admitting: Pharmacy Technician

## 2024-01-23 ENCOUNTER — Other Ambulatory Visit (HOSPITAL_COMMUNITY): Payer: Self-pay

## 2024-01-23 ENCOUNTER — Encounter: Payer: Self-pay | Admitting: Oncology

## 2024-01-23 ENCOUNTER — Other Ambulatory Visit: Payer: Self-pay | Admitting: Pharmacist

## 2024-01-23 DIAGNOSIS — C61 Malignant neoplasm of prostate: Secondary | ICD-10-CM

## 2024-01-23 MED ORDER — DAROLUTAMIDE 300 MG PO TABS: 600.0000 mg | ORAL_TABLET | Freq: Two times a day (BID) | ORAL | 3 refills | Status: AC

## 2024-01-23 NOTE — Telephone Encounter (Signed)
 Oral Oncology Patient Advocate Encounter  Prior Authorization for Nubeqa  has been approved.    PA# [id number] Effective dates: 03/05/2023 through 03/03/2025  Patients co-pay is $1,788.39.    Cody Vaughan (Patty) Chet Burnet, CPhT  [facility], [place], [facility] - Oral Chemotherapy Patient Advocate Specialist III Phone: [phone]  Fax: [phone]

## 2024-01-23 NOTE — Telephone Encounter (Signed)
 Like patient to resume therapy with darolutamide .  Prescription for darolutamide  sent to PAP program.  Patient advocate Odetta will follow-up with patient next week to ensure he gets set up for delivery of medication from PAP program

## 2024-01-23 NOTE — Telephone Encounter (Signed)
 Oral Oncology Patient Advocate Encounter   New authorization   Received notification that prior authorization for Nubeqa  is required.   PA submitted on CMM via [employer] [id number] Status is pending     Sirius Woodford (Patty) Chet Burnet, CPhT  [facility], [place], [facility] - Oral Chemotherapy Patient Advocate Specialist III Phone: [phone]  Fax: [phone]

## 2024-01-28 ENCOUNTER — Encounter: Payer: Self-pay | Admitting: Oncology

## 2024-02-02 ENCOUNTER — Inpatient Hospital Stay: Attending: Oncology | Admitting: Oncology

## 2024-02-02 ENCOUNTER — Ambulatory Visit

## 2024-02-02 ENCOUNTER — Inpatient Hospital Stay: Attending: Oncology

## 2024-02-02 ENCOUNTER — Inpatient Hospital Stay

## 2024-02-02 ENCOUNTER — Encounter: Payer: Self-pay | Admitting: Oncology

## 2024-02-02 VITALS — BP 114/73 | HR 63 | Temp 98.6°F | Resp 20 | Wt 171.1 lb

## 2024-02-02 DIAGNOSIS — M899 Disorder of bone, unspecified: Secondary | ICD-10-CM

## 2024-02-02 DIAGNOSIS — D649 Anemia, unspecified: Secondary | ICD-10-CM | POA: Diagnosis not present

## 2024-02-02 DIAGNOSIS — Z720 Tobacco use: Secondary | ICD-10-CM

## 2024-02-02 DIAGNOSIS — G893 Neoplasm related pain (acute) (chronic): Secondary | ICD-10-CM | POA: Insufficient documentation

## 2024-02-02 DIAGNOSIS — C61 Malignant neoplasm of prostate: Secondary | ICD-10-CM

## 2024-02-02 DIAGNOSIS — Z79899 Other long term (current) drug therapy: Secondary | ICD-10-CM | POA: Diagnosis not present

## 2024-02-02 DIAGNOSIS — C7951 Secondary malignant neoplasm of bone: Secondary | ICD-10-CM | POA: Insufficient documentation

## 2024-02-02 DIAGNOSIS — Z87891 Personal history of nicotine dependence: Secondary | ICD-10-CM | POA: Insufficient documentation

## 2024-02-02 LAB — CMP (CANCER CENTER ONLY)
ALT: 32 U/L (ref 0–44)
AST: 32 U/L (ref 15–41)
Albumin: 3.7 g/dL (ref 3.5–5.0)
Alkaline Phosphatase: 120 U/L (ref 38–126)
Anion gap: 10 (ref 5–15)
BUN: 6 mg/dL — ABNORMAL LOW (ref 8–23)
CO2: 24 mmol/L (ref 22–32)
Calcium: 8.7 mg/dL — ABNORMAL LOW (ref 8.9–10.3)
Chloride: 104 mmol/L (ref 98–111)
Creatinine: 0.64 mg/dL (ref 0.61–1.24)
GFR, Estimated: >60 mL/min (ref >60)
Glucose, Bld: 131 mg/dL — ABNORMAL HIGH (ref 70–99)
Potassium: 3.9 mmol/L (ref 3.5–5.1)
Sodium: 138 mmol/L (ref 135–145)
Total Bilirubin: 0.2 mg/dL (ref 0.0–1.2)
Total Protein: 6.7 g/dL (ref 6.5–8.1)

## 2024-02-02 LAB — CBC WITH DIFFERENTIAL (CANCER CENTER ONLY)
Abs Immature Granulocytes: 0.02 K/uL (ref 0.00–0.07)
Basophils Absolute: 0.1 K/uL (ref 0.0–0.1)
Basophils Relative: 1 %
Eosinophils Absolute: 0.4 K/uL (ref 0.0–0.5)
Eosinophils Relative: 7 %
HCT: 34.1 % — ABNORMAL LOW (ref 39.0–52.0)
Hemoglobin: 11.3 g/dL — ABNORMAL LOW (ref 13.0–17.0)
Immature Granulocytes: 0 %
Lymphocytes Relative: 25 %
Lymphs Abs: 1.4 K/uL (ref 0.7–4.0)
MCH: 29.5 pg (ref 26.0–34.0)
MCHC: 33.1 g/dL (ref 30.0–36.0)
MCV: 89 fL (ref 80.0–100.0)
Monocytes Absolute: 0.7 K/uL (ref 0.1–1.0)
Monocytes Relative: 12 %
Neutro Abs: 3.1 K/uL (ref 1.7–7.7)
Neutrophils Relative %: 55 %
Platelet Count: 216 K/uL (ref 150–400)
RBC: 3.83 MIL/uL — ABNORMAL LOW (ref 4.22–5.81)
RDW: 14.6 % (ref 11.5–15.5)
WBC Count: 5.7 K/uL (ref 4.0–10.5)
nRBC: 0 % (ref 0.0–0.2)

## 2024-02-02 LAB — PSA: Prostatic Specific Antigen: 8.44 ng/mL — ABNORMAL HIGH (ref 0.00–4.00)

## 2024-02-02 MED ORDER — LEUPROLIDE ACETATE (6 MONTH) 45 MG ~~LOC~~ KIT
45.0000 mg | PACK | Freq: Once | SUBCUTANEOUS | Status: AC
  Administered 2024-02-02: 45 mg via SUBCUTANEOUS
  Filled 2024-02-02: qty 45

## 2024-02-02 MED ORDER — MORPHINE SULFATE ER 15 MG PO TBCR: 15.0000 mg | EXTENDED_RELEASE_TABLET | Freq: Two times a day (BID) | ORAL | 0 refills | Status: DC

## 2024-02-02 MED ORDER — OXYCODONE HCL 10 MG PO TABS: 10.0000 mg | ORAL_TABLET | ORAL | 0 refills | Status: DC | PRN

## 2024-02-02 NOTE — Assessment & Plan Note (Signed)
Smoke cessation discussed Recommend him to establish with PCP CT chest lung cancer screening is up-to-date.

## 2024-02-02 NOTE — Assessment & Plan Note (Signed)
 Recommend monthly Zometa  Recommend calcium  1200mg  daily and Vitamin D 1000 units.

## 2024-02-02 NOTE — Assessment & Plan Note (Addendum)
 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   NGS molecular studies.-[facility] NGS showed Tp53, TMB 5.3, MSI stable. Patient declined genetic testing.  Labs reviewed and discussed with patient.   PSA has been stable,today's level is pending Recent CT scan showed Progressive lucencies within the thoracic vertebral bodies, most pronounced at T5, T6, and T10 patient is currently asymptomatic.  If he develops symptoms, consider radiation treatments. Continue androgen deprivation therapy-Eligard  45 mg every 6 months-today, and next due early June 2026 on pluvicto  treatments, patient should have 4 out of the 6 planned treatments.   PSA is rising. Continue darolutamide  600 mg twice daily  Plan to repeat PSMA after he finishes 6  treatments.

## 2024-02-02 NOTE — Progress Notes (Signed)
 Hematology/Oncology Progress note [phone] [phone]      CHIEF COMPLAINTS/REASON FOR VISIT:  Follow-up for prostate cancer treatment   ASSESSMENT & PLAN:   Cancer Staging  Prostate cancer [facility]) Staging form: Prostate, AJCC 8th Edition - Clinical stage from 06/23/2020: Stage IVB (cT2c, cNX, cM1b, PSA: 678, Grade Group: 5) - Signed by Babara Call, MD on 06/23/2020   Prostate cancer [facility]) Metastatic prostate cancer with bone metastasis,  castration resistant  s/p Docetaxel  x 6 cycles, followed by Darolutamide .   NGS molecular studies.-[facility] NGS showed Tp53, TMB 5.3, MSI stable. Patient declined genetic testing.  Labs reviewed and discussed with patient.   PSA has been stable,today's level is pending Recent CT scan showed Progressive lucencies within the thoracic vertebral bodies, most pronounced at T5, T6, and T10 patient is currently asymptomatic.  If he develops symptoms, consider radiation treatments. Continue androgen deprivation therapy-Eligard  45 mg every 6 months-today, and next due early June 2026 on pluvicto  treatments, patient should have 4 out of the 6 planned treatments.   PSA is rising. Continue darolutamide  600 mg twice daily  Plan to repeat PSMA after he finishes 6  treatments.   Bone lesion Recommend monthly Zometa  Recommend calcium  1200mg  daily and Vitamin D 1000 units.   Neoplasm related pain Recommend Oxycodone  5-10mg  Q6 PRN.  MS Contin  15 mg twice daily   Normocytic anemia Hb is  decreased. Due to treatments.  Monitor.   Tobacco use Smoke cessation discussed Recommend him to establish with PCP CT chest lung cancer screening is up-to-date.  Orders Placed This Encounter  Procedures   CMP (Cancer Center only)    Standing Status:   Future    Expected Date:   03/01/2024    Expiration Date:   05/30/2024   CBC with Differential (Cancer Center Only)    Standing Status:   Future    Expected Date:   03/01/2024    Expiration Date:    05/30/2024   PSA    Standing Status:   Future    Expected Date:   03/01/2024    Expiration Date:   05/30/2024   Follow up per LOS  All questions were answered. The patient knows to call the clinic with any problems, questions or concerns.  Call Babara, MD, PhD [facility] Hematology Oncology 02/02/2024    HISTORY OF PRESENTING ILLNESS:  05/27/2020 patient presented to emergency room for evaluation of generalized weakness and and body pain.  Prior to the presentations, patient also had history of tooth abscess and was prescribed antibiotics. Reports acute on chronic right-sided lumbar pain without any prior trauma history.  Profound weakness.  Decreased appetite and loss of weight about 10 pounds during the past few months.  Also complained intermittent right upper quadrant discomfort  326 04/10/2020 CT abdomen pelvis showed a diffusely patchy sclerotic appearance of the osseous structures suspicious for osseous metastatic disease.  Prostate gland is heterogeneous in appearance.  Correlate with PSA.  Patient had a mild superior endplate compression deformity of L4 with less than 10% vertebral body height loss.  H indeterminate.  Fusiform infrarenal abdominal aortic aneurysm measuring up to 3.5 cm.  Follow-up in 2 years. Suspicious findings for early acute uncomplicated appendicitis.  Patient denies any right lower quadrant pain.  In the emergency room, he has a negative Murphy sign.  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  Highest Gleason score 5+4 Postprocedure, patient has had hematuria and hematochezia,  bilateral lower extremity pain, AKI orthostatic hypovolemia, sepsis and was admitted and treated.  Patient was discharged on a course of antibiotics.  06/18/2020 MRI thoracic, lumbar, sacrum is compatible with diffuse osseous metastasis in the visualized thoracic lumbar spine, sacrum and bilateral iliac bones.  No  definitive dural based tumor is identified.  No compression fracture.  Spine spondylosis  06/19/2020, patient received loading dose of Firmagon  240 mg x 1. #06/22/2020, bone scan showed widespread osseous metastatic disease involving the appendicular and axial skeleton #April 2022. COVID infection. #07/10/2020, Dr. Marea placed Mediport #07/17/2020-10/30/2020 6 cycles of docetaxel .   #03/06/2021, started on darolutamide   [ARASENS trial, the combination of darolutamide  plus docetaxel  is an approved option for treatment of metastatic CSPC- Improved OS, time to develop castration resistant disease].   #INTERVAL HISTORY Cody Vaughan is a 63 y.o. male who has above history reviewed by me today presents for follow up visit for management of metastatic prostate cancer, 08/26/2023 started on Pleuvicto treatments. Plan 6 cycles Chronic back pain after work. + hot flash improved after taking Effexor . He had fall episodes and had right side rib cage pain. He takes oxycodone  1-2 times per day and MS contin  15 mg BID. He feels pain is controlled.   Patient resumed Darolutamide  600 mg twice daily on 01/30/2024 as advised.  His appetite has improved since switched to mirtazapine  from Effexor .  He has experienced side effects from Pluvicto  treatments, including fatigue, aches, chills, and fever-like symptoms,  Review of Systems  Constitutional:  Positive for fatigue. Negative for appetite change, chills, fever and unexpected weight change.       Patient walks independently  HENT:   Negative for hearing loss and voice change.   Eyes:  Negative for eye problems and icterus.  Respiratory:  Negative for chest tightness, cough and shortness of breath.   Cardiovascular:  Negative for chest pain and leg swelling.  Gastrointestinal:  Negative for abdominal distention and abdominal pain.  Endocrine: Positive for hot flashes.  Genitourinary:  Negative for difficulty urinating, dysuria and frequency.   Musculoskeletal:   Positive for back pain. Negative for arthralgias.  Skin:  Negative for itching and rash.  Neurological:  Negative for light-headedness and numbness.  Hematological:  Negative for adenopathy. Does not bruise/bleed easily.  Psychiatric/Behavioral:  Negative for confusion.     MEDICAL HISTORY:  Past Medical History:  Diagnosis Date   Prostate cancer (HCC)    2022    SURGICAL HISTORY: Past Surgical History:  Procedure Laterality Date   PORTA CATH INSERTION N/A 07/10/2020   Procedure: PORTA CATH INSERTION;  Surgeon: Marea Selinda GORMAN, MD;  Location: [facility] INVASIVE CV LAB;  Service: Cardiovascular;  Laterality: N/A;   PROSTATE BIOPSY N/A 06/16/2020   Procedure: PROSTATE BIOPSY;  Surgeon: Francisca Redell BROCKS, MD;  Location: [facility] ORS;  Service: Urology;  Laterality: N/A;   SHOULDER SURGERY     1985   TRANSRECTAL ULTRASOUND N/A 06/16/2020   Procedure: TRANSRECTAL ULTRASOUND;  Surgeon: Francisca Redell BROCKS, MD;  Location: [facility] ORS;  Service: Urology;  Laterality: N/A;    SOCIAL HISTORY: Social History   Socioeconomic History   Marital status: Married    Spouse name: Not on file   Number of children: Not on file   Years of education: Not on file   Highest education level: Not on file  Occupational History   Occupation: land scaper    Employer: [employer]  Tobacco Use   Smoking status: Former  Current packs/day: 0.00    Average packs/day: 1 pack/day for 45.0 years (45.0 ttl pk-yrs)    Types: Cigarettes    Start date: 07/09/1977    Quit date: 07/10/2022    Years since quitting: 1.5   Smokeless tobacco: Never  Substance and Sexual Activity   Alcohol use: Not Currently   Drug use: Never   Sexual activity: Not Currently  Other Topics Concern   Not on file  Social History Narrative   Not on file   Social Drivers of Health   Financial Resource Strain: Low Risk  (10/13/2023)   Overall Financial Resource Strain (CARDIA)    Difficulty of Paying Living Expenses: Not hard at  all  Food Insecurity: No Food Insecurity (10/13/2023)   Hunger Vital Sign    Worried About Running Out of Food in the Last Year: Never true    Ran Out of Food in the Last Year: Never true  Transportation Needs: No Transportation Needs (10/13/2023)   PRAPARE - Administrator, Civil Service (Medical): No    Lack of Transportation (Non-Medical): No  Physical Activity: Not on file  Stress: Not on file  Social Connections: Not on file  Intimate Partner Violence: Unknown (10/13/2023)   Humiliation, Afraid, Rape, and Kick questionnaire    Fear of Current or Ex-Partner: No    Emotionally Abused: No    Physically Abused: No    Sexually Abused: Not on file    FAMILY HISTORY: Family History  Problem Relation Age of Onset   COPD Mother    Heart attack Father     ALLERGIES:  has no known allergies.  MEDICATIONS:  Current Outpatient Medications  Medication Sig Dispense Refill   acetaminophen  (TYLENOL ) 325 MG tablet Take 650 mg by mouth every 6 (six) hours as needed for moderate pain or mild pain.     Calcium  600-400 MG-UNIT CHEW Chew 2 tablets by mouth daily. 60 tablet 3   darolutamide  (NUBEQA ) 300 MG tablet Take 2 tablets (600 mg total) by mouth 2 (two) times daily with a meal. 120 tablet 3   Lutetium Lu 177 Vipivotide Tet (PLUVICTO  IV) Inject into the vein.     mirtazapine  (REMERON ) 7.5 MG tablet Take 1 tablet (7.5 mg total) by mouth at bedtime. 30 tablet 3   Multiple Vitamins-Minerals (MULTIVITAMIN WITH MINERALS) tablet Take 1 tablet by mouth daily.     amoxicillin -clavulanate (AUGMENTIN ) 875-125 MG tablet Take 1 tablet by mouth every 12 (twelve) hours. (Patient not taking: Reported on 02/02/2024) 14 tablet 0   benzonatate  (TESSALON ) 100 MG capsule Take 1 capsule (100 mg total) by mouth every 8 (eight) hours. (Patient not taking: Reported on 02/02/2024) 21 capsule 0   meloxicam  (MOBIC ) 7.5 MG tablet Take 1 tablet (7.5 mg total) by mouth daily. (Patient not taking: Reported on  02/02/2024) 30 tablet 2   morphine  (MS CONTIN ) 15 MG 12 hr tablet Take 1 tablet (15 mg total) by mouth every 12 (twelve) hours. 60 tablet 0   nicotine  (NICODERM CQ  - DOSED IN MG/24 HOURS) 14 mg/24hr patch PLACE 1 PATCH ONTO THE SKIN DAILY (Patient not taking: Reported on 02/02/2024) 30 patch 0   ondansetron  (ZOFRAN ) 4 MG tablet Take 1 tablet (4 mg total) by mouth every 8 (eight) hours as needed for nausea or vomiting. (Patient not taking: Reported on 02/02/2024) 20 tablet 0   Oxycodone  HCl 10 MG TABS Take 1 tablet (10 mg total) by mouth every 4 (four) hours as needed (pain). 60 tablet  0   predniSONE  (STERAPRED UNI-PAK 21 TAB) 10 MG (21) TBPK tablet Take by mouth daily. Take 6 tabs by mouth daily  for 1 days, then 5 tabs for 1 days, then 4 tabs for 1 days, then 3 tabs for 1 days, 2 tabs for 1 days, then 1 tab by mouth daily for 1 days (Patient not taking: Reported on 02/02/2024) 21 tablet 0   No current facility-administered medications for this visit.   Facility-Administered Medications Ordered in Other Visits  Medication Dose Route Frequency Provider Last Rate Last Admin   heparin  lock flush 100 UNIT/ML injection              PHYSICAL EXAMINATION: ECOG PERFORMANCE STATUS: 0 - Asymptomatic Vitals:   02/02/24 1000  BP: 114/73  Pulse: 63  Resp: 20  Temp: 98.6 F (37 C)  SpO2: 100%   Filed Weights   02/02/24 1000  Weight: 171 lb 1.6 oz (77.6 kg)    Physical Exam Constitutional:      General: He is not in acute distress. HENT:     Head: Normocephalic and atraumatic.  Eyes:     General: No scleral icterus. Cardiovascular:     Rate and Rhythm: Normal rate and regular rhythm.     Heart sounds: Normal heart sounds.  Pulmonary:     Effort: Pulmonary effort is normal. No respiratory distress.     Breath sounds: No wheezing.  Abdominal:     General: Bowel sounds are normal. There is no distension.     Palpations: Abdomen is soft.  Musculoskeletal:        General: No deformity. Normal  range of motion.     Cervical back: Normal range of motion and neck supple.  Skin:    General: Skin is warm and dry.     Findings: No erythema or rash.  Neurological:     Mental Status: He is alert and oriented to person, place, and time. Mental status is at baseline.     Cranial Nerves: No cranial nerve deficit.     Coordination: Coordination normal.  Psychiatric:        Mood and Affect: Mood normal.     LABORATORY DATA:  I have reviewed the data as listed    Latest Ref Rng & Units 02/02/2024    8:52 AM 01/02/2024   10:01 AM 12/19/2023   10:30 AM  CBC  WBC 4.0 - 10.5 K/uL 5.7  6.3  7.0   Hemoglobin 13.0 - 17.0 g/dL 88.6  89.0  88.1   Hematocrit 39.0 - 52.0 % 34.1  32.6  34.4   Platelets 150 - 400 K/uL 216  374  216       Latest Ref Rng & Units 02/02/2024    8:52 AM 01/02/2024   10:01 AM 12/19/2023   10:30 AM  CMP  Glucose 70 - 99 mg/dL 868  860  895   BUN 8 - 23 mg/dL 6  16  19    Creatinine 0.61 - 1.24 mg/dL 9.35  9.26  9.32   Sodium 135 - 145 mmol/L 138  136  137   Potassium 3.5 - 5.1 mmol/L 3.9  3.6  4.1   Chloride 98 - 111 mmol/L 104  101  104   CO2 22 - 32 mmol/L 24  25  25    Calcium  8.9 - 10.3 mg/dL 8.7  8.9  8.9   Total Protein 6.5 - 8.1 g/dL 6.7  7.1  7.3   Total Bilirubin 0.0 -  1.2 mg/dL 0.2  0.3  0.4   Alkaline Phos 38 - 126 U/L 120  126  148   AST 15 - 41 U/L 32  31  25   ALT 0 - 44 U/L 32  30  24      RADIOGRAPHIC STUDIES: I have personally reviewed the radiological images as listed and agreed with the findings in the report. NM PLUVICTO  ADMINISTRATION Result Date: 01/09/2024 EXAM: NUCLEAR MEDICINE PLUVICTO  INJECTION 01/08/2024 03:35:42 PM TECHNIQUE: Patient presented to nuclear medicine for treatment of metastatic prostate cancer. PSMA PET scan was reviewed. The patient's most recent labs were reviewed and it was determined to proceed with Lu-177 Pluvicto . Written informed consent was obtained from the patient. Written instructions for radiation safety  were provided. The radiopharmaceutical was injected intravenously according to local protocol. RADIOPHARMACEUTICAL: 207 mCi Lu-177 PLUVICTO  COMPARISON: None available. CLINICAL HISTORY: Prostate cancer. Prostate cancer. FINDINGS: Current Infusion: This is the 4th treatment. Patient reports some mild fatigue. No nausea and vomiting following the third treatment. Patient's PSA is mildly elevated in the interval, PSA equals 10 compared to 7 on prior. Mild stable anemia. No leukopenia. Normal renal function. Planned Infusions: 2 IMPRESSION: 1. Fourth of six planned Lu-177 Pluvicto  treatments administered. 2. Patient reports mild fatigue. 3. Mild stable anemia. No leukopenia. Normal renal function. Electronically signed by: Norleen Boxer MD 01/09/2024 02:11 PM EST RP Workstation: [id number]   CT Angio Chest Pulmonary Embolism (PE) W or WO Contrast Result Date: 12/19/2023 CLINICAL DATA:  Chest pain for 2 weeks, history of metastatic prostate cancer, fell several weeks ago, pain with inspiration EXAM: CT ANGIOGRAPHY CHEST WITH CONTRAST TECHNIQUE: Multidetector CT imaging of the chest was performed using the standard protocol during bolus administration of intravenous contrast. Multiplanar CT image reconstructions and MIPs were obtained to evaluate the vascular anatomy. RADIATION DOSE REDUCTION: This exam was performed according to the departmental dose-optimization program which includes automated exposure control, adjustment of the mA and/or kV according to patient size and/or use of iterative reconstruction technique. CONTRAST:  75mL OMNIPAQUE  IOHEXOL  350 MG/ML SOLN COMPARISON:  07/29/2023, 12/18/2022 FINDINGS: Cardiovascular: This is a technically adequate evaluation of the pulmonary vasculature. No filling defects or pulmonary emboli. 4.5 cm ascending thoracic aortic aneurysm, with no evidence of dissection. Atherosclerosis throughout the thoracic aorta. The great vessels are patent. Heart is unremarkable without  pericardial effusion. Coronary artery atherosclerosis most pronounced within the left main and circumflex distributions. Right chest wall port via internal jugular approach, tip within the superior vena cava. Mediastinum/Nodes: No enlarged mediastinal, hilar, or axillary lymph nodes. Thyroid gland, trachea, and esophagus demonstrate no significant findings. Lungs/Pleura: Stable emphysema. No acute airspace disease, effusion, or pneumothorax. Areas of subpleural linear consolidation within the lung bases likely reflect subsegmental atelectasis or scarring. Central airways are patent. Upper Abdomen: No acute abnormality. Musculoskeletal: Model appearance of the thoracic vertebral bodies consistent with known bony metastases. Progressive lucencies are seen at the T5, T6, and T10 levels compatible with progression of known bony metastases. Increased sclerosis around the lucency at the T10 level could reflect treatment response. Numerous sclerotic lesions are again seen throughout the thoracic cage and sternum, unchanged, and consistent with known bony metastases. There are no acute displaced fractures. Reconstructed images demonstrate no additional findings. Review of the MIP images confirms the above findings. IMPRESSION: 1. Progressive lucencies within the thoracic vertebral bodies, most pronounced at T5, T6, and T10, concerning for worsening bony metastatic disease. Sclerosis surrounding the large lucency at T10 could reflect post treatment changes.  2. Numerous other sclerotic lesions throughout the thoracic cage and sternum, compatible with known bony metastases, stable. 3. No acute displaced fracture. 4. No evidence of pulmonary embolus. 5. 4.5 cm ascending thoracic aortic aneurysm. Ascending thoracic aortic aneurysm. Recommend semi-annual imaging followup by CTA or MRA and referral to cardiothoracic surgery if not already obtained. This recommendation follows 2010 ACCF/AHA/AATS/ACR/ASA/SCA/SCAI/SIR/STS/SVM  Guidelines for the Diagnosis and Management of Patients With Thoracic Aortic Disease. Circulation. 2010; 121: Z733-z630. Aortic aneurysm NOS (ICD10-I71.9) 6. Aortic Atherosclerosis (ICD10-I70.0) and Emphysema (ICD10-J43.9). Electronically Signed   By: Ozell Daring M.D.   On: 12/19/2023 15:40   NM PLUVICTO  ADMINISTRATION Result Date: 11/27/2023 CLINICAL DATA:  Castrate resistant prostate carcinoma. Skeletal metastasis. EXAM: NUCLEAR MEDICINE PLUVICTO  INJECTION TECHNIQUE: Infusion: The nuclear medicine technologist and I personally verified the dose activity to be delivered as specified in the written directive, and verified the patient identification via 2 separate methods. Initial flush of the intravenous catheter was performed was sterile saline. The dose syringe was connected to the catheter and the Lu-177 Pluvicto  administered over a 1 to 10 min infusion. Single 10 cc lushes with normal saline follow the dose. No complications were noted. The entire IV tubing, venocatheter, stopcock and syringes was removed in total, placed in a disposal bag and sent for assay of the residual activity, which will be reported at a later time in our EMR by the physics staff. Pressure was applied to the venipuncture site, and a compression bandage placed. Patient monitored for 1 hour following infusion. Radiation Safety personnel were present to perform the discharge survey, as detailed on their documentation. After a short period of observation, the patient had his IV removed. RADIOPHARMACEUTICALS:  Two hundred two microcuries Lu-177 PLUVICTO  FINDINGS: Current Infusion: 3 Planned Infusions: 6 Patient presented to nuclear medicine for treatment. The patient's most recent blood counts were reviewed and remains a good candidate to proceed with Lu-177 Pluvicto . PSA stable at 7.4.  No mild suppression renal toxicity. Patient reports 1 day of flu-like symptoms following last therapy. Intermittent back pain. The patient was situated  in an infusion suite with a contact barrier placed under the arm. Intravenous access was established, using sterile technique, and a normal saline infusion from a syringe was started. Micro-dosimetry: The prescribed radiation activity was assayed and confirmed to be within specified tolerance. IMPRESSION: Current Infusion: 3 Planned Infusions: 6 The patient tolerated the infusion well. The patient will return in 6 weeks for ongoing care. Electronically Signed   By: Jackquline Boxer M.D.   On: 11/27/2023 16:31

## 2024-02-02 NOTE — Assessment & Plan Note (Signed)
 Hb is  decreased. Due to treatments.  Monitor.

## 2024-02-02 NOTE — Assessment & Plan Note (Addendum)
 Recommend Oxycodone  5-10mg  Q6 PRN.  MS Contin  15 mg twice daily

## 2024-02-03 DIAGNOSIS — H40003 Preglaucoma, unspecified, bilateral: Secondary | ICD-10-CM | POA: Diagnosis not present

## 2024-02-03 DIAGNOSIS — H25813 Combined forms of age-related cataract, bilateral: Secondary | ICD-10-CM | POA: Diagnosis not present

## 2024-02-05 ENCOUNTER — Inpatient Hospital Stay

## 2024-02-05 VITALS — BP 123/74 | HR 84 | Temp 98.1°F | Resp 18

## 2024-02-05 DIAGNOSIS — C7951 Secondary malignant neoplasm of bone: Secondary | ICD-10-CM | POA: Diagnosis not present

## 2024-02-05 DIAGNOSIS — C61 Malignant neoplasm of prostate: Secondary | ICD-10-CM

## 2024-02-05 MED ORDER — SODIUM CHLORIDE 0.9 % IV SOLN
INTRAVENOUS | Status: DC
  Filled 2024-02-05: qty 250

## 2024-02-05 MED ORDER — ZOLEDRONIC ACID 4 MG/100ML IV SOLN
4.0000 mg | Freq: Once | INTRAVENOUS | Status: AC
  Administered 2024-02-05: 4 mg via INTRAVENOUS
  Filled 2024-02-05: qty 100

## 2024-02-05 NOTE — Patient Instructions (Signed)

## 2024-02-06 ENCOUNTER — Telehealth: Payer: Self-pay | Admitting: Pharmacy Technician

## 2024-02-06 NOTE — Telephone Encounter (Signed)
 Oral Oncology Patient Advocate Encounter   Began re-enrollment application for assistance for Nubeqa  through [facility] [place]  Patient Assistance Foundation.   Application will be submitted upon completion of necessary supporting documentation.   [employer] phone number [phone].   Waiting on pt signature, form has been sent to pt's email via D[employer].   Aubriel Khanna (Patty) Chet Burnet, CPhT  [facility], [place], [facility] - Oral Chemotherapy Patient Advocate Specialist III Phone: [phone]  Fax: [phone]

## 2024-02-09 ENCOUNTER — Other Ambulatory Visit: Payer: Self-pay | Admitting: Oncology

## 2024-02-10 ENCOUNTER — Inpatient Hospital Stay

## 2024-02-10 NOTE — Progress Notes (Signed)
 Nutrition Follow-up:  Patient with metastatic prostate cancer.  Currently on pluvicto , eligard , nubeqa   Spoke with patient via phone for nutrition follow-up.  Patient reports that appetite is doing better, still does not eat the volume of food that he once did but eating 3 meals and a day and snacks.  Drinking a lot of eggnog at this time.  Less boost shakes.  Breakfast is cereal, lunch sandwich and dinner meat and vegetables.  Snacks on peanut butter sandwiches, cookies and eggnog.  Had issue with constipation recently improved with mild laxative.      Medications: remeron   Labs: reviewed  Anthropometrics:   Weight 171 lb 1.6 oz  167 lb on 11/4 160 lb on 10/17 170 lb on 8/11 183 lb on 10/2    NUTRITION DIAGNOSIS: Inadequate oral intake improved   INTERVENTION:  Continue 3 meals a day plus snacks.   Continue oral nutrition supplements (350 calories)/eggnog for added calories  Patient to contact RD if needed   NEXT VISIT: no follow-up RD available as needed  Korrey Schleicher B. Dasie SOLON, CSO, LDN Registered Dietitian [phone]

## 2024-02-11 ENCOUNTER — Encounter: Payer: Self-pay | Admitting: Oncology

## 2024-02-11 NOTE — Written Directive (Addendum)
°  PLUVICTO   THERAPY   RADIOPHARMACEUTICAL: Lutetium 177 vipivotide tetraxetan (Pluvicto )     PRESCRIBED DOSE FOR ADMINISTRATION:  200 mCi   ROUTE OFADMINISTRATION:  IV   DIAGNOSIS:  Prostate cancer (HCC)    REFERRING PHYSICIAN: Babara Call, MD   TREATMENT #: 5   ADDITIONAL PHYSICIAN COMMENTS/NOTES:  NO labs today AUTHORIZED USER SIGNATURE & TIME STAMP: Norleen GORMAN Boxer, MD   02/12/2024    8:37 AM

## 2024-02-12 ENCOUNTER — Inpatient Hospital Stay (HOSPITAL_COMMUNITY): Admission: RE | Admit: 2024-02-12 | Discharge: 2024-02-12 | Attending: Oncology

## 2024-02-12 ENCOUNTER — Encounter: Payer: Self-pay | Admitting: Oncology

## 2024-02-12 DIAGNOSIS — C61 Malignant neoplasm of prostate: Secondary | ICD-10-CM

## 2024-02-12 MED ORDER — SODIUM CHLORIDE 0.9 % IV SOLN: INTRAVENOUS | Status: AC

## 2024-02-12 MED ORDER — LUTETIUM LU 177 VIPIVOTIDE TET 1000 MBQ/ML IV SOLN
204.0000 | Freq: Once | INTRAVENOUS | Status: AC
  Administered 2024-02-12: 204 via INTRAVENOUS

## 2024-02-12 MED ORDER — SODIUM CHLORIDE 0.9 % IV BOLUS: 1000.0000 mL | Freq: Once | INTRAVENOUS | Status: DC

## 2024-02-12 NOTE — Telephone Encounter (Signed)
 Oral Oncology Patient Advocate Encounter   Submitted application for assistance for Nubeqa  to [employer].   Application submitted via e-fax to [phone]   [employer] phone number [phone].   I will continue to check the status until final determination.   Steele Stracener (Patty) Chet Burnet, CPhT  [facility], [place], [facility] - Oral Chemotherapy Patient Advocate Specialist III Phone: [phone]  Fax: [phone]

## 2024-02-12 NOTE — Telephone Encounter (Signed)
 Oral Oncology Patient Advocate Encounter   Received notification that the application for assistance for Nubeqa  through [employer] has been approved.   [employer] phone number [phone].   Effective dates: 03/04/2024 through 02/11/2025  Medication will be filled at [facility].  I have spoken to the patient.  Cody Vaughan (Patty) Chet Burnet, CPhT  [facility], [place], [facility] - Oral Chemotherapy Patient Advocate Specialist III Phone: [phone]  Fax: [phone]

## 2024-02-12 NOTE — Progress Notes (Signed)
 EXAM:  NUCLEAR MEDICINE PLUVICTO  INJECTION    TECHNIQUE:  Patient presented to nuclear medicine for treatment of metastatic prostate  cancer.  PSMA PET scan was reviewed.   The patient's most recent labs were reviewed and it was determined to proceed  with Lu-177 Pluvicto .  Written informed consent was obtained from the patient. Written instructions  for radiation safety were provided.   The radiopharmaceutical was injected intravenously according to local protocol.   RADIOPHARMACEUTICAL:  204 mCi Lu-177 PLUVICTO    COMPARISON:  None available.   CLINICAL HISTORY:  Prostate cancer. Prostate cancer.     FINDINGS:   Current Infusion:  This is the 5. th treatment. Patient reports some mild fatigue. No nausea and  vomiting following the 4thtreatment. SOME increased appetite. Patient's PSA is mildly decreased in the  interval, PSA equals . 8.4 compared to 10.5 on prior. Mild stable anemia. No  leukopenia. Normal renal function.   Planned Infusions:  6   IMPRESSION:  1. Fith of six planned Lu-177 Pluvicto  treatments administered.  2. Patient reports mild fatigue.  3. Mild stable anemia. No leukopenia. Normal renal function.   SABRA SABRA

## 2024-02-18 ENCOUNTER — Inpatient Hospital Stay: Admitting: Licensed Clinical Social Worker

## 2024-02-18 NOTE — Progress Notes (Signed)
 [facility] CSW Progress Note  Clinical Child Psychotherapist contacted patient by phone to follow-up on emotional support after [facility] discharge.    Interventions: Provided patient with information about reason for call.  He stated he was doing well.  He was not interested in follow up or advance directives at this time.       Follow Up Plan:  Patient will contact CSW with any support or resource needs    Cody CHRISTELLA Au, LCSW Clinical Social Worker [facility]

## 2024-02-19 ENCOUNTER — Inpatient Hospital Stay (HOSPITAL_COMMUNITY): Admission: RE | Admit: 2024-02-19 | Source: Ambulatory Visit

## 2024-03-05 ENCOUNTER — Inpatient Hospital Stay: Admitting: Nurse Practitioner

## 2024-03-05 ENCOUNTER — Telehealth: Payer: Self-pay | Admitting: *Deleted

## 2024-03-05 ENCOUNTER — Inpatient Hospital Stay

## 2024-03-05 ENCOUNTER — Inpatient Hospital Stay: Admitting: Oncology

## 2024-03-05 NOTE — Telephone Encounter (Signed)
 Patient left vm. Pt's wife has the flu and wants to r/s his apts today to avoid spread of germs. Patient r/s to 1/8

## 2024-03-11 ENCOUNTER — Inpatient Hospital Stay: Admitting: Nurse Practitioner

## 2024-03-11 ENCOUNTER — Encounter: Payer: Self-pay | Admitting: Oncology

## 2024-03-11 ENCOUNTER — Inpatient Hospital Stay: Attending: Oncology

## 2024-03-11 ENCOUNTER — Inpatient Hospital Stay

## 2024-03-11 ENCOUNTER — Inpatient Hospital Stay: Attending: Oncology | Admitting: Oncology

## 2024-03-11 VITALS — BP 132/71 | HR 88 | Temp 96.6°F | Resp 18 | Wt 168.5 lb

## 2024-03-11 VITALS — BP 139/74 | HR 60 | Resp 18

## 2024-03-11 DIAGNOSIS — R232 Flushing: Secondary | ICD-10-CM | POA: Diagnosis not present

## 2024-03-11 DIAGNOSIS — Z79899 Other long term (current) drug therapy: Secondary | ICD-10-CM | POA: Diagnosis not present

## 2024-03-11 DIAGNOSIS — C61 Malignant neoplasm of prostate: Secondary | ICD-10-CM | POA: Diagnosis not present

## 2024-03-11 DIAGNOSIS — M899 Disorder of bone, unspecified: Secondary | ICD-10-CM | POA: Diagnosis not present

## 2024-03-11 DIAGNOSIS — C7951 Secondary malignant neoplasm of bone: Secondary | ICD-10-CM | POA: Diagnosis present

## 2024-03-11 DIAGNOSIS — D649 Anemia, unspecified: Secondary | ICD-10-CM

## 2024-03-11 DIAGNOSIS — G893 Neoplasm related pain (acute) (chronic): Secondary | ICD-10-CM | POA: Diagnosis not present

## 2024-03-11 DIAGNOSIS — Z87891 Personal history of nicotine dependence: Secondary | ICD-10-CM | POA: Diagnosis not present

## 2024-03-11 LAB — CBC WITH DIFFERENTIAL (CANCER CENTER ONLY)
Abs Immature Granulocytes: 0.02 K/uL (ref 0.00–0.07)
Basophils Absolute: 0.1 K/uL (ref 0.0–0.1)
Basophils Relative: 2 %
Eosinophils Absolute: 0.3 K/uL (ref 0.0–0.5)
Eosinophils Relative: 5 %
HCT: 34.3 % — ABNORMAL LOW (ref 39.0–52.0)
Hemoglobin: 11.7 g/dL — ABNORMAL LOW (ref 13.0–17.0)
Immature Granulocytes: 0 %
Lymphocytes Relative: 23 %
Lymphs Abs: 1.2 K/uL (ref 0.7–4.0)
MCH: 30.4 pg (ref 26.0–34.0)
MCHC: 34.1 g/dL (ref 30.0–36.0)
MCV: 89.1 fL (ref 80.0–100.0)
Monocytes Absolute: 0.6 K/uL (ref 0.1–1.0)
Monocytes Relative: 12 %
Neutro Abs: 3.1 K/uL (ref 1.7–7.7)
Neutrophils Relative %: 58 %
Platelet Count: 215 K/uL (ref 150–400)
RBC: 3.85 MIL/uL — ABNORMAL LOW (ref 4.22–5.81)
RDW: 14.6 % (ref 11.5–15.5)
WBC Count: 5.4 K/uL (ref 4.0–10.5)
nRBC: 0 % (ref 0.0–0.2)

## 2024-03-11 LAB — CMP (CANCER CENTER ONLY)
ALT: 42 U/L (ref 0–44)
AST: 40 U/L (ref 15–41)
Albumin: 4.1 g/dL (ref 3.5–5.0)
Alkaline Phosphatase: 128 U/L — ABNORMAL HIGH (ref 38–126)
Anion gap: 11 (ref 5–15)
BUN: 10 mg/dL (ref 8–23)
CO2: 23 mmol/L (ref 22–32)
Calcium: 9.4 mg/dL (ref 8.9–10.3)
Chloride: 105 mmol/L (ref 98–111)
Creatinine: 0.81 mg/dL (ref 0.61–1.24)
GFR, Estimated: >60 mL/min
Glucose, Bld: 138 mg/dL — ABNORMAL HIGH (ref 70–99)
Potassium: 3.8 mmol/L (ref 3.5–5.1)
Sodium: 139 mmol/L (ref 135–145)
Total Bilirubin: 0.3 mg/dL (ref 0.0–1.2)
Total Protein: 7 g/dL (ref 6.5–8.1)

## 2024-03-11 LAB — PSA: Prostatic Specific Antigen: 10 ng/mL — ABNORMAL HIGH (ref 0.00–4.00)

## 2024-03-11 MED ORDER — MORPHINE SULFATE ER 15 MG PO TBCR: 15.0000 mg | EXTENDED_RELEASE_TABLET | Freq: Two times a day (BID) | ORAL | 0 refills | Status: AC

## 2024-03-11 MED ORDER — ZOLEDRONIC ACID 4 MG/100ML IV SOLN
4.0000 mg | Freq: Once | INTRAVENOUS | Status: AC
  Administered 2024-03-11: 4 mg via INTRAVENOUS
  Filled 2024-03-11: qty 100

## 2024-03-11 MED ORDER — OXYCODONE HCL 10 MG PO TABS: 10.0000 mg | ORAL_TABLET | ORAL | 0 refills | Status: AC | PRN

## 2024-03-11 MED ORDER — SODIUM CHLORIDE 0.9 % IV SOLN
INTRAVENOUS | Status: DC
  Filled 2024-03-11: qty 250

## 2024-03-11 MED ORDER — VENLAFAXINE HCL 37.5 MG PO TABS: 37.5000 mg | ORAL_TABLET | Freq: Every day | ORAL | 2 refills | Status: AC

## 2024-03-11 NOTE — Assessment & Plan Note (Signed)
 Hot flash due to ADT, resume Effexor  37.5mg  daily.

## 2024-03-11 NOTE — Assessment & Plan Note (Signed)
 Hb is  decreased. Due to treatments.  Monitor.

## 2024-03-11 NOTE — Assessment & Plan Note (Signed)
 Recommend monthly Zometa  Recommend calcium  1200mg  daily and Vitamin D 1000 units.

## 2024-03-11 NOTE — Assessment & Plan Note (Addendum)
 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   NGS molecular studies.-[facility] NGS showed Tp53, TMB 5.3, MSI stable. Patient declined genetic testing.  Labs reviewed and discussed with patient.   PSA has been stable,today's level is pending Recent CT scan showed Progressive lucencies within the thoracic vertebral bodies, most pronounced at T5, T6, and T10 patient is currently asymptomatic.  If he develops symptoms, consider radiation treatments. Continue androgen deprivation therapy-Eligard  45 mg every 6 months-today, and next due early June 2026 on pluvicto  treatments, he had 5 treatments.   Monitor PSA  Continue darolutamide  600 mg twice daily  Plan to repeat PSMA after he finishes 6  treatments.

## 2024-03-11 NOTE — Progress Notes (Signed)
 " Hematology/Oncology Progress note [phone] [phone]      CHIEF COMPLAINTS/REASON FOR VISIT:  Follow-up for prostate cancer treatment   ASSESSMENT & PLAN:   Cancer Staging  Prostate cancer [facility]) Staging form: Prostate, AJCC 8th Edition - Clinical stage from 06/23/2020: Stage IVB (cT2c, cNX, cM1b, PSA: 678, Grade Group: 5) - Signed by Babara Call, MD on 06/23/2020   Prostate cancer [facility]) Metastatic prostate cancer with bone metastasis,  castration resistant  s/p Docetaxel  x 6 cycles, followed by Darolutamide .   NGS molecular studies.-[facility] NGS showed Tp53, TMB 5.3, MSI stable. Patient declined genetic testing.  Labs reviewed and discussed with patient.   PSA has been stable,today's level is pending Recent CT scan showed Progressive lucencies within the thoracic vertebral bodies, most pronounced at T5, T6, and T10 patient is currently asymptomatic.  If he develops symptoms, consider radiation treatments. Continue androgen deprivation therapy-Eligard  45 mg every 6 months-today, and next due early June 2026 on pluvicto  treatments, he had 5 treatments.   Monitor PSA  Continue darolutamide  600 mg twice daily  Plan to repeat PSMA after he finishes 6  treatments.   Neoplasm related pain Recommend Oxycodone  5-10mg  Q6 PRN.  MS Contin  15 mg twice daily   Bone lesion Recommend monthly Zometa  Recommend calcium  1200mg  daily and Vitamin D 1000 units.   Normocytic anemia Hb is  decreased. Due to treatments.  Monitor.   Vasomotor flushing Hot flash due to ADT, resume Effexor  37.5mg  daily.  Orders Placed This Encounter  Procedures   CBC with Differential (Cancer Center Only)    Standing Status:   Future    Expected Date:   04/19/2024    Expiration Date:   07/18/2024   CMP (Cancer Center only)    Standing Status:   Future    Expected Date:   04/19/2024    Expiration Date:   07/18/2024   PSA    Standing Status:   Future    Expected Date:   04/19/2024     Expiration Date:   07/18/2024   Follow up per LOS  All questions were answered. The patient knows to call the clinic with any problems, questions or concerns.  Call Babara, MD, PhD [facility] Hematology Oncology 03/11/2024    HISTORY OF PRESENTING ILLNESS:  05/27/2020 patient presented to emergency room for evaluation of generalized weakness and and body pain.  Prior to the presentations, patient also had history of tooth abscess and was prescribed antibiotics. Reports acute on chronic right-sided lumbar pain without any prior trauma history.  Profound weakness.  Decreased appetite and loss of weight about 10 pounds during the past few months.  Also complained intermittent right upper quadrant discomfort  326 04/10/2020 CT abdomen pelvis showed a diffusely patchy sclerotic appearance of the osseous structures suspicious for osseous metastatic disease.  Prostate gland is heterogeneous in appearance.  Correlate with PSA.  Patient had a mild superior endplate compression deformity of L4 with less than 10% vertebral body height loss.  H indeterminate.  Fusiform infrarenal abdominal aortic aneurysm measuring up to 3.5 cm.  Follow-up in 2 years. Suspicious findings for early acute uncomplicated appendicitis.  Patient denies any right lower quadrant pain.  In the emergency room, he has a negative Murphy sign.  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  Highest Gleason score 5+4 Postprocedure, patient has had hematuria and hematochezia, bilateral lower extremity pain, AKI orthostatic hypovolemia, sepsis and was admitted and  treated.  Patient was discharged on a course of antibiotics.  06/18/2020 MRI thoracic, lumbar, sacrum is compatible with diffuse osseous metastasis in the visualized thoracic lumbar spine, sacrum and bilateral iliac bones.  No definitive dural based tumor is identified.  No compression fracture.  Spine  spondylosis  06/19/2020, patient received loading dose of Firmagon  240 mg x 1. #06/22/2020, bone scan showed widespread osseous metastatic disease involving the appendicular and axial skeleton #April 2022. COVID infection. #07/10/2020, Dr. Marea placed Mediport #07/17/2020-10/30/2020 6 cycles of docetaxel .   #03/06/2021, started on darolutamide   [ARASENS trial, the combination of darolutamide  plus docetaxel  is an approved option for treatment of metastatic CSPC- Improved OS, time to develop castration resistant disease].   08/26/2023 started on Pleuvicto treatments. Plan 6 cycles  #INTERVAL HISTORY KARION CUDD is a 64 y.o. male who has above history reviewed by me today presents for follow up visit for management of metastatic prostate cancer, Chronic back pain He takes oxycodone  1-2 times per day and MS contin  15 mg BID. He feels pain is controlled.   Patient resumed Darolutamide  600 mg twice daily on 01/30/2024 as advised. He tolerates well.  His appetite has improved since switched to mirtazapine  from Effexor . Currently not taking Mirtazapine .  + hot flush is worse and he wises to resume on Effexor .   He has experienced side effects from Pluvicto  treatments, including fatigue, aches, chills, and fever-like symptoms,  Review of Systems  Constitutional:  Positive for fatigue. Negative for appetite change, chills, fever and unexpected weight change.       Patient walks independently  HENT:   Negative for hearing loss and voice change.   Eyes:  Negative for eye problems and icterus.  Respiratory:  Negative for chest tightness, cough and shortness of breath.   Cardiovascular:  Negative for chest pain and leg swelling.  Gastrointestinal:  Negative for abdominal distention and abdominal pain.  Endocrine: Positive for hot flashes.  Genitourinary:  Negative for difficulty urinating, dysuria and frequency.   Musculoskeletal:  Positive for back pain. Negative for arthralgias.  Skin:  Negative for  itching and rash.  Neurological:  Negative for light-headedness and numbness.  Hematological:  Negative for adenopathy. Does not bruise/bleed easily.  Psychiatric/Behavioral:  Negative for confusion.     MEDICAL HISTORY:  Past Medical History:  Diagnosis Date   Prostate cancer (HCC)    2022    SURGICAL HISTORY: Past Surgical History:  Procedure Laterality Date   PORTA CATH INSERTION N/A 07/10/2020   Procedure: PORTA CATH INSERTION;  Surgeon: Marea Selinda GORMAN, MD;  Location: [facility] INVASIVE CV LAB;  Service: Cardiovascular;  Laterality: N/A;   PROSTATE BIOPSY N/A 06/16/2020   Procedure: PROSTATE BIOPSY;  Surgeon: Francisca Redell BROCKS, MD;  Location: [facility] ORS;  Service: Urology;  Laterality: N/A;   SHOULDER SURGERY     1985   TRANSRECTAL ULTRASOUND N/A 06/16/2020   Procedure: TRANSRECTAL ULTRASOUND;  Surgeon: Francisca Redell BROCKS, MD;  Location: [facility] ORS;  Service: Urology;  Laterality: N/A;    SOCIAL HISTORY: Social History   Socioeconomic History   Marital status: Married    Spouse name: Not on file   Number of children: Not on file   Years of education: Not on file   Highest education level: Not on file  Occupational History   Occupation: land scaper    Employer: [employer]  Tobacco Use   Smoking status: Former    Current packs/day: 0.00    Average packs/day: 1 pack/day  for 45.0 years (45.0 ttl pk-yrs)    Types: Cigarettes    Start date: 07/09/1977    Quit date: 07/10/2022    Years since quitting: 1.6   Smokeless tobacco: Never  Substance and Sexual Activity   Alcohol use: Not Currently   Drug use: Never   Sexual activity: Not Currently  Other Topics Concern   Not on file  Social History Narrative   Not on file   Social Drivers of Health   Tobacco Use: Medium Risk (03/11/2024)   Patient History    Smoking Tobacco Use: Former    Smokeless Tobacco Use: Never    Passive Exposure: Not on file  Financial Resource Strain: Low Risk (10/13/2023)   Overall  Financial Resource Strain (CARDIA)    Difficulty of Paying Living Expenses: Not hard at all  Food Insecurity: No Food Insecurity (10/13/2023)   Epic    Worried About Programme Researcher, Broadcasting/film/video in the Last Year: Never true    Ran Out of Food in the Last Year: Never true  Transportation Needs: No Transportation Needs (10/13/2023)   Epic    Lack of Transportation (Medical): No    Lack of Transportation (Non-Medical): No  Physical Activity: Not on file  Stress: Not on file  Social Connections: Not on file  Intimate Partner Violence: Unknown (10/13/2023)   Epic    Fear of Current or Ex-Partner: No    Emotionally Abused: No    Physically Abused: No    Sexually Abused: Not on file  Depression (PHQ2-9): Low Risk (02/05/2024)   Depression (PHQ2-9)    PHQ-2 Score: 0  Alcohol Screen: Not on file  Housing: Low Risk (10/13/2023)   Epic    Unable to Pay for Housing in the Last Year: No    Number of Times Moved in the Last Year: 0    Homeless in the Last Year: No  Utilities: Not At Risk (10/13/2023)   Epic    Threatened with loss of utilities: No  Health Literacy: Adequate Health Literacy (10/13/2023)   B1300 Health Literacy    Frequency of need for help with medical instructions: Never    FAMILY HISTORY: Family History  Problem Relation Age of Onset   COPD Mother    Heart attack Father     ALLERGIES:  has no known allergies.  MEDICATIONS:  Current Outpatient Medications  Medication Sig Dispense Refill   acetaminophen  (TYLENOL ) 325 MG tablet Take 650 mg by mouth every 6 (six) hours as needed for moderate pain or mild pain.     Calcium  600-400 MG-UNIT CHEW Chew 2 tablets by mouth daily. 60 tablet 3   darolutamide  (NUBEQA ) 300 MG tablet Take 2 tablets (600 mg total) by mouth 2 (two) times daily with a meal. 120 tablet 3   Lutetium Lu 177 Vipivotide Tet (PLUVICTO  IV) Inject into the vein.     Multiple Vitamins-Minerals (MULTIVITAMIN WITH MINERALS) tablet Take 1 tablet by mouth daily.      venlafaxine  (EFFEXOR ) 37.5 MG tablet Take 1 tablet (37.5 mg total) by mouth daily. 30 tablet 2   benzonatate  (TESSALON ) 100 MG capsule Take 1 capsule (100 mg total) by mouth every 8 (eight) hours. (Patient not taking: Reported on 03/11/2024) 21 capsule 0   meloxicam  (MOBIC ) 7.5 MG tablet Take 1 tablet (7.5 mg total) by mouth daily. (Patient not taking: Reported on 03/11/2024) 30 tablet 2   morphine  (MS CONTIN ) 15 MG 12 hr tablet Take 1 tablet (15 mg total) by mouth every 12 (  twelve) hours. 60 tablet 0   nicotine  (NICODERM CQ  - DOSED IN MG/24 HOURS) 14 mg/24hr patch PLACE 1 PATCH ONTO THE SKIN DAILY (Patient not taking: Reported on 03/11/2024) 30 patch 0   ondansetron  (ZOFRAN ) 4 MG tablet Take 1 tablet (4 mg total) by mouth every 8 (eight) hours as needed for nausea or vomiting. (Patient not taking: Reported on 03/11/2024) 20 tablet 0   Oxycodone  HCl 10 MG TABS Take 1 tablet (10 mg total) by mouth every 4 (four) hours as needed (pain). 180 tablet 0   predniSONE  (STERAPRED UNI-PAK 21 TAB) 10 MG (21) TBPK tablet Take by mouth daily. Take 6 tabs by mouth daily  for 1 days, then 5 tabs for 1 days, then 4 tabs for 1 days, then 3 tabs for 1 days, 2 tabs for 1 days, then 1 tab by mouth daily for 1 days (Patient not taking: Reported on 03/11/2024) 21 tablet 0   No current facility-administered medications for this visit.   Facility-Administered Medications Ordered in Other Visits  Medication Dose Route Frequency Provider Last Rate Last Admin   heparin  lock flush 100 UNIT/ML injection              PHYSICAL EXAMINATION: ECOG PERFORMANCE STATUS: 0 - Asymptomatic Vitals:   03/11/24 0935  BP: 132/71  Pulse: 88  Resp: 18  Temp: (!) 96.6 F (35.9 C)  SpO2: 97%   Filed Weights   03/11/24 0935  Weight: 168 lb 8 oz (76.4 kg)    Physical Exam Constitutional:      General: He is not in acute distress. HENT:     Head: Normocephalic and atraumatic.  Eyes:     General: No scleral icterus. Cardiovascular:      Rate and Rhythm: Normal rate and regular rhythm.     Heart sounds: Normal heart sounds.  Pulmonary:     Effort: Pulmonary effort is normal. No respiratory distress.     Breath sounds: No wheezing.  Abdominal:     General: Bowel sounds are normal. There is no distension.     Palpations: Abdomen is soft.  Musculoskeletal:        General: No deformity. Normal range of motion.     Cervical back: Normal range of motion and neck supple.  Skin:    General: Skin is warm and dry.     Findings: No erythema or rash.  Neurological:     Mental Status: He is alert and oriented to person, place, and time. Mental status is at baseline.     Cranial Nerves: No cranial nerve deficit.     Coordination: Coordination normal.  Psychiatric:        Mood and Affect: Mood normal.     LABORATORY DATA:  I have reviewed the data as listed    Latest Ref Rng & Units 03/11/2024    9:24 AM 02/02/2024    8:52 AM 01/02/2024   10:01 AM  CBC  WBC 4.0 - 10.5 K/uL 5.4  5.7  6.3   Hemoglobin 13.0 - 17.0 g/dL 88.2  88.6  89.0   Hematocrit 39.0 - 52.0 % 34.3  34.1  32.6   Platelets 150 - 400 K/uL 215  216  374       Latest Ref Rng & Units 03/11/2024    9:24 AM 02/02/2024    8:52 AM 01/02/2024   10:01 AM  CMP  Glucose 70 - 99 mg/dL 861  868  860   BUN 8 - 23 mg/dL  10  6  16    Creatinine 0.61 - 1.24 mg/dL 9.18  9.35  9.26   Sodium 135 - 145 mmol/L 139  138  136   Potassium 3.5 - 5.1 mmol/L 3.8  3.9  3.6   Chloride 98 - 111 mmol/L 105  104  101   CO2 22 - 32 mmol/L 23  24  25    Calcium  8.9 - 10.3 mg/dL 9.4  8.7  8.9   Total Protein 6.5 - 8.1 g/dL 7.0  6.7  7.1   Total Bilirubin 0.0 - 1.2 mg/dL 0.3  0.2  0.3   Alkaline Phos 38 - 126 U/L 128  120  126   AST 15 - 41 U/L 40  32  31   ALT 0 - 44 U/L 42  32  30      RADIOGRAPHIC STUDIES: I have personally reviewed the radiological images as listed and agreed with the findings in the report. NM PLUVICTO  ADMINISTRATION Result Date: 02/12/2024 EXAM: NUCLEAR MEDICINE  PLUVICTO  INJECTION TECHNIQUE: PATIENT PRESENTED TO NUCLEAR MEDICINE FOR TREATMENT OF METASTATIC PROSTATE CANCER. PSMA PET SCAN WAS REVIEWED. THE PATIENT'S MOST RECENT LABS WERE REVIEWED AND IT WAS DETERMINED TO PROCEED WITH LU-177 PLUVICTO . WRITTEN INFORMED CONSENT WAS OBTAINED FROM THE PATIENT. WRITTEN INSTRUCTIONS FOR RADIATION SAFETY WERE PROVIDED. THE RADIOPHARMACEUTICAL WAS INJECTED INTRAVENOUSLY ACCORDING TO LOCAL PROTOCOL. RADIOPHARMACEUTICAL: 204 MCI LU-177 PLUVICTO  COMPARISON: NONE AVAILABLE. CLINICAL HISTORY: PROSTATE CANCER. PROSTATE CANCER. FINDINGS: CURRENT INFUSION: THIS IS THE 5. TH TREATMENT. PATIENT REPORTS SOME MILD FATIGUE. NO NAUSEA AND VOMITING FOLLOWING THE 4THTREATMENT. SOME INCREASED APPETITE. PATIENT'S PSA IS MILDLY DECREASED  IN THE INTERVAL, PSA EQUALS .  8.4 COMPARED TO 10.5 ON PRIOR. MILD STABLE ANEMIA. NO LEUKOPENIA. NORMAL RENAL FUNCTION. PLANNED INFUSIONS: 6 IMPRESSION: 1. FITH OF SIX PLANNED LU-177 PLUVICTO  TREATMENTS ADMINISTERED. 2. PATIENT REPORTS MILD FATIGUE. 3. MILD STABLE ANEMIA. NO LEUKOPENIA. NORMAL RENAL FUNCTION. . . Electronically signed by: Norleen Boxer MD 02/12/2024 02:01 PM EST RP Workstation: [id number]   NM PLUVICTO  ADMINISTRATION Result Date: 01/09/2024 EXAM: NUCLEAR MEDICINE PLUVICTO  INJECTION 01/08/2024 03:35:42 PM TECHNIQUE: Patient presented to nuclear medicine for treatment of metastatic prostate cancer. PSMA PET scan was reviewed. The patient's most recent labs were reviewed and it was determined to proceed with Lu-177 Pluvicto . Written informed consent was obtained from the patient. Written instructions for radiation safety were provided. The radiopharmaceutical was injected intravenously according to local protocol. RADIOPHARMACEUTICAL: 207 mCi Lu-177 PLUVICTO  COMPARISON: None available. CLINICAL HISTORY: Prostate cancer. Prostate cancer. FINDINGS: Current Infusion: This is the 4th treatment. Patient reports some mild fatigue. No nausea and vomiting  following the third treatment. Patient's PSA is mildly elevated in the interval, PSA equals 10 compared to 7 on prior. Mild stable anemia. No leukopenia. Normal renal function. Planned Infusions: 2 IMPRESSION: 1. Fourth of six planned Lu-177 Pluvicto  treatments administered. 2. Patient reports mild fatigue. 3. Mild stable anemia. No leukopenia. Normal renal function. Electronically signed by: Norleen Boxer MD 01/09/2024 02:11 PM EST RP Workstation: [id number]   CT Angio Chest Pulmonary Embolism (PE) W or WO Contrast Result Date: 12/19/2023 CLINICAL DATA:  Chest pain for 2 weeks, history of metastatic prostate cancer, fell several weeks ago, pain with inspiration EXAM: CT ANGIOGRAPHY CHEST WITH CONTRAST TECHNIQUE: Multidetector CT imaging of the chest was performed using the standard protocol during bolus administration of intravenous contrast. Multiplanar CT image reconstructions and MIPs were obtained to evaluate the vascular anatomy. RADIATION DOSE REDUCTION: This exam was performed according to the departmental dose-optimization program which  includes automated exposure control, adjustment of the mA and/or kV according to patient size and/or use of iterative reconstruction technique. CONTRAST:  75mL OMNIPAQUE  IOHEXOL  350 MG/ML SOLN COMPARISON:  07/29/2023, 12/18/2022 FINDINGS: Cardiovascular: This is a technically adequate evaluation of the pulmonary vasculature. No filling defects or pulmonary emboli. 4.5 cm ascending thoracic aortic aneurysm, with no evidence of dissection. Atherosclerosis throughout the thoracic aorta. The great vessels are patent. Heart is unremarkable without pericardial effusion. Coronary artery atherosclerosis most pronounced within the left main and circumflex distributions. Right chest wall port via internal jugular approach, tip within the superior vena cava. Mediastinum/Nodes: No enlarged mediastinal, hilar, or axillary lymph nodes. Thyroid gland, trachea, and esophagus demonstrate  no significant findings. Lungs/Pleura: Stable emphysema. No acute airspace disease, effusion, or pneumothorax. Areas of subpleural linear consolidation within the lung bases likely reflect subsegmental atelectasis or scarring. Central airways are patent. Upper Abdomen: No acute abnormality. Musculoskeletal: Model appearance of the thoracic vertebral bodies consistent with known bony metastases. Progressive lucencies are seen at the T5, T6, and T10 levels compatible with progression of known bony metastases. Increased sclerosis around the lucency at the T10 level could reflect treatment response. Numerous sclerotic lesions are again seen throughout the thoracic cage and sternum, unchanged, and consistent with known bony metastases. There are no acute displaced fractures. Reconstructed images demonstrate no additional findings. Review of the MIP images confirms the above findings. IMPRESSION: 1. Progressive lucencies within the thoracic vertebral bodies, most pronounced at T5, T6, and T10, concerning for worsening bony metastatic disease. Sclerosis surrounding the large lucency at T10 could reflect post treatment changes. 2. Numerous other sclerotic lesions throughout the thoracic cage and sternum, compatible with known bony metastases, stable. 3. No acute displaced fracture. 4. No evidence of pulmonary embolus. 5. 4.5 cm ascending thoracic aortic aneurysm. Ascending thoracic aortic aneurysm. Recommend semi-annual imaging followup by CTA or MRA and referral to cardiothoracic surgery if not already obtained. This recommendation follows 2010 ACCF/AHA/AATS/ACR/ASA/SCA/SCAI/SIR/STS/SVM Guidelines for the Diagnosis and Management of Patients With Thoracic Aortic Disease. Circulation. 2010; 121: Z733-z630. Aortic aneurysm NOS (ICD10-I71.9) 6. Aortic Atherosclerosis (ICD10-I70.0) and Emphysema (ICD10-J43.9). Electronically Signed   By: Ozell Daring M.D.   On: 12/19/2023 15:40    "

## 2024-03-11 NOTE — Patient Instructions (Signed)

## 2024-03-11 NOTE — Assessment & Plan Note (Signed)
 Recommend Oxycodone  5-10mg  Q6 PRN.  MS Contin  15 mg twice daily

## 2024-03-15 ENCOUNTER — Inpatient Hospital Stay: Admitting: Hospice and Palliative Medicine

## 2024-03-15 DIAGNOSIS — G893 Neoplasm related pain (acute) (chronic): Secondary | ICD-10-CM | POA: Diagnosis not present

## 2024-03-15 DIAGNOSIS — C61 Malignant neoplasm of prostate: Secondary | ICD-10-CM | POA: Diagnosis not present

## 2024-03-15 DIAGNOSIS — R61 Generalized hyperhidrosis: Secondary | ICD-10-CM | POA: Diagnosis not present

## 2024-03-15 NOTE — Progress Notes (Signed)
 Virtual Visit via Telephone Note  I connected with Cody Vaughan on 03/15/2024 at  3:40 PM EST by telephone and verified that I am speaking with the correct person using two identifiers.  Location: Patient: Home Provider: Clinic   I discussed the limitations, risks, security and privacy concerns of performing an evaluation and management service by telephone and the availability of in person appointments. I also discussed with the patient that there may be a patient responsible charge related to this service. The patient expressed understanding and agreed to proceed.   History of Present Illness: Cody Vaughan is a 64 y.o. 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  Patient has had ongoing pain.  He is referred to palliative care to address goals and manage ongoing symptoms.  Observations/Objective: I spoke with patient by phone.  He reports he is doing okay.  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  States that pain is tolerable on current regimen of MS Contin  and oxycodone .  Denies any adverse effects from pain medications.  Refills sent recently by Dr. Babara.   Patient reports moods are stable.  He was taken off mirtazapine  and rotated to Effexor  recently due to night sweats.  Assessment and Plan: Prostate Cancer -on Pluvicto   Neoplasm related pain -Continue MS Contin /oxycodone .  PDMP reviewed.  Night sweats - started on Effexor  recently   Follow Up Instructions: Follow-up telephone visit 2 months   I discussed the assessment and treatment plan with the patient. The patient was provided an opportunity to ask questions and all were answered. The patient agreed with the plan and demonstrated an understanding of the instructions.   The patient was advised to call back or seek an in-person evaluation if the symptoms worsen or if the condition fails to improve as anticipated.  I provided 10 minutes of  non-face-to-face time during this encounter.   FONDA JONELLE MOWER, NP

## 2024-03-24 NOTE — Written Directive (Cosign Needed)
" °  PLUVICTO   THERAPY   RADIOPHARMACEUTICAL: Lutetium 177 vipivotide tetraxetan (Pluvicto )     PRESCRIBED DOSE FOR ADMINISTRATION:  200 mCi   ROUTE OFADMINISTRATION:  IV   DIAGNOSIS:  Prostate cancer   REFERRING PHYSICIAN:Yu, Zhou, MD    TREATMENT #: 6   ADDITIONAL PHYSICIAN COMMENTS/NOTES:   AUTHORIZED USER SIGNATURE & TIME STAMP: "

## 2024-03-25 ENCOUNTER — Ambulatory Visit (HOSPITAL_COMMUNITY)
Admission: RE | Admit: 2024-03-25 | Discharge: 2024-03-25 | Disposition: A | Discharge status: Home / Self Care | Source: Ambulatory Visit | Attending: Oncology | Admitting: Oncology

## 2024-03-25 DIAGNOSIS — C61 Malignant neoplasm of prostate: Secondary | ICD-10-CM | POA: Diagnosis present

## 2024-03-25 MED ORDER — SODIUM CHLORIDE 0.9 % IV BOLUS
1000.0000 mL | Freq: Once | INTRAVENOUS | Status: AC
  Administered 2024-03-25: 1000 mL via INTRAVENOUS

## 2024-03-25 MED ORDER — LUTETIUM LU 177 VIPIVOTIDE TET 1000 MBQ/ML IV SOLN
200.0000 | Freq: Once | INTRAVENOUS | Status: AC
  Administered 2024-03-25: 205.801 via INTRAVENOUS

## 2024-03-25 NOTE — Progress Notes (Signed)
 Tolerated Pluvicto  treatment well.  All  questions and concerns answered by [facility]

## 2024-03-25 NOTE — Progress Notes (Signed)
 EXAM: NUCLEAR MEDICINE PLUVICTO  INJECTION 03/25/2024 12:18:23 PM TECHNIQUE: Patient presented to nuclear medicine for treatment of metastatic prostate cancer. PSMA PET scan was reviewed. The patient's most recent labs were reviewed and it was determined to proceed with Lu-177 Pluvicto . Written informed consent was obtained from the patient. Written instructions for radiation safety were provided. The radiopharmaceutical, 205.8 ml of Lu-177 Pluvicto , was injected intravenously according to local protocol. This is treatment number 6 of 6. Patient reports no interval adverse events. Patient PSA remains relatively stable at 10.0. Renal function normal. CBC stable. RADIOPHARMACEUTICAL: [205.8] mCi Lu-177 PLUVICTO  COMPARISON:   None available. CLINICAL HISTORY: Prostate cancer. Prostate cancer. FINDINGS: Current Infusion: 6 of 6 IMPRESSION: Final treatment. Patient tolerated entire course of treatment well. PSA relatively unchanged. Consider follow-up PSMA PET scan in 6 to 8 weeks.

## 2024-04-01 ENCOUNTER — Other Ambulatory Visit (HOSPITAL_COMMUNITY)

## 2024-04-19 ENCOUNTER — Inpatient Hospital Stay: Admitting: Oncology

## 2024-04-19 ENCOUNTER — Inpatient Hospital Stay

## 2024-06-14 ENCOUNTER — Inpatient Hospital Stay: Admitting: Hospice and Palliative Medicine
